# Patient Record
Sex: Female | Born: 1945 | Race: White | Hispanic: No | Marital: Married | Smoking: Former smoker
Health system: Southern US, Community
[De-identification: ages and names within clinical notes are randomized; demographics above are authoritative.]

## PROBLEM LIST (undated history)

## (undated) DIAGNOSIS — I519 Heart disease, unspecified: Secondary | ICD-10-CM

## (undated) DIAGNOSIS — I701 Atherosclerosis of renal artery: Secondary | ICD-10-CM

## (undated) DIAGNOSIS — G629 Polyneuropathy, unspecified: Secondary | ICD-10-CM

## (undated) DIAGNOSIS — Z9889 Other specified postprocedural states: Secondary | ICD-10-CM

## (undated) DIAGNOSIS — H269 Unspecified cataract: Secondary | ICD-10-CM

## (undated) DIAGNOSIS — N289 Disorder of kidney and ureter, unspecified: Secondary | ICD-10-CM

## (undated) DIAGNOSIS — E039 Hypothyroidism, unspecified: Secondary | ICD-10-CM

## (undated) DIAGNOSIS — I509 Heart failure, unspecified: Secondary | ICD-10-CM

## (undated) DIAGNOSIS — F419 Anxiety disorder, unspecified: Secondary | ICD-10-CM

## (undated) DIAGNOSIS — Z9289 Personal history of other medical treatment: Secondary | ICD-10-CM

## (undated) DIAGNOSIS — I272 Pulmonary hypertension, unspecified: Secondary | ICD-10-CM

## (undated) DIAGNOSIS — I330 Acute and subacute infective endocarditis: Secondary | ICD-10-CM

## (undated) DIAGNOSIS — T8859XA Other complications of anesthesia, initial encounter: Secondary | ICD-10-CM

## (undated) DIAGNOSIS — F329 Major depressive disorder, single episode, unspecified: Secondary | ICD-10-CM

## (undated) DIAGNOSIS — I219 Acute myocardial infarction, unspecified: Secondary | ICD-10-CM

## (undated) DIAGNOSIS — M1990 Unspecified osteoarthritis, unspecified site: Secondary | ICD-10-CM

## (undated) DIAGNOSIS — I82890 Acute embolism and thrombosis of other specified veins: Secondary | ICD-10-CM

## (undated) DIAGNOSIS — Z8489 Family history of other specified conditions: Secondary | ICD-10-CM

## (undated) DIAGNOSIS — F32A Depression, unspecified: Secondary | ICD-10-CM

## (undated) DIAGNOSIS — M6281 Muscle weakness (generalized): Secondary | ICD-10-CM

## (undated) DIAGNOSIS — I771 Stricture of artery: Secondary | ICD-10-CM

## (undated) DIAGNOSIS — E7800 Pure hypercholesterolemia, unspecified: Secondary | ICD-10-CM

## (undated) DIAGNOSIS — I2510 Atherosclerotic heart disease of native coronary artery without angina pectoris: Secondary | ICD-10-CM

## (undated) DIAGNOSIS — R7989 Other specified abnormal findings of blood chemistry: Secondary | ICD-10-CM

## (undated) DIAGNOSIS — K219 Gastro-esophageal reflux disease without esophagitis: Secondary | ICD-10-CM

## (undated) DIAGNOSIS — T4145XA Adverse effect of unspecified anesthetic, initial encounter: Secondary | ICD-10-CM

## (undated) DIAGNOSIS — I319 Disease of pericardium, unspecified: Secondary | ICD-10-CM

## (undated) DIAGNOSIS — R0602 Shortness of breath: Secondary | ICD-10-CM

## (undated) DIAGNOSIS — I10 Essential (primary) hypertension: Secondary | ICD-10-CM

## (undated) DIAGNOSIS — N179 Acute kidney failure, unspecified: Secondary | ICD-10-CM

## (undated) DIAGNOSIS — G4730 Sleep apnea, unspecified: Secondary | ICD-10-CM

## (undated) DIAGNOSIS — R112 Nausea with vomiting, unspecified: Secondary | ICD-10-CM

## (undated) DIAGNOSIS — I639 Cerebral infarction, unspecified: Secondary | ICD-10-CM

## (undated) DIAGNOSIS — I200 Unstable angina: Secondary | ICD-10-CM

## (undated) DIAGNOSIS — R42 Dizziness and giddiness: Secondary | ICD-10-CM

## (undated) DIAGNOSIS — B955 Unspecified streptococcus as the cause of diseases classified elsewhere: Secondary | ICD-10-CM

## (undated) HISTORY — DX: Essential (primary) hypertension: I10

## (undated) HISTORY — DX: Pure hypercholesterolemia, unspecified: E78.00

## (undated) HISTORY — PX: BACK SURGERY: SHX140

## (undated) HISTORY — PX: COLON SURGERY: SHX602

## (undated) HISTORY — PX: LAPAROSCOPIC TUBAL LIGATION: SUR803

## (undated) HISTORY — PX: TUBAL LIGATION: SHX77

## (undated) HISTORY — PX: RENAL ARTERY STENT: SHX2321

## (undated) HISTORY — DX: Disorder of kidney and ureter, unspecified: N28.9

## (undated) HISTORY — DX: Personal history of other medical treatment: Z92.89

## (undated) HISTORY — DX: Heart failure, unspecified: I50.9

---

## 2001-01-17 ENCOUNTER — Ambulatory Visit (HOSPITAL_COMMUNITY): Admission: RE | Admit: 2001-01-17 | Discharge: 2001-01-17 | Payer: Self-pay | Admitting: Cardiology

## 2001-04-02 ENCOUNTER — Ambulatory Visit (HOSPITAL_COMMUNITY): Admission: RE | Admit: 2001-04-02 | Discharge: 2001-04-02 | Payer: Self-pay | Admitting: Family Medicine

## 2001-04-02 ENCOUNTER — Encounter: Payer: Self-pay | Admitting: Family Medicine

## 2001-04-02 ENCOUNTER — Other Ambulatory Visit: Admission: RE | Admit: 2001-04-02 | Discharge: 2001-04-02 | Payer: Self-pay | Admitting: Family Medicine

## 2001-08-24 ENCOUNTER — Ambulatory Visit (HOSPITAL_COMMUNITY): Admission: RE | Admit: 2001-08-24 | Discharge: 2001-08-24 | Payer: Self-pay | Admitting: Family Medicine

## 2001-08-24 ENCOUNTER — Encounter: Payer: Self-pay | Admitting: Family Medicine

## 2002-01-11 ENCOUNTER — Inpatient Hospital Stay (HOSPITAL_COMMUNITY): Admission: EM | Admit: 2002-01-11 | Discharge: 2002-01-15 | Payer: Self-pay | Admitting: *Deleted

## 2002-01-11 ENCOUNTER — Encounter: Payer: Self-pay | Admitting: *Deleted

## 2002-05-02 ENCOUNTER — Ambulatory Visit (HOSPITAL_COMMUNITY): Admission: RE | Admit: 2002-05-02 | Discharge: 2002-05-02 | Payer: Self-pay | Admitting: Family Medicine

## 2002-05-02 ENCOUNTER — Encounter: Payer: Self-pay | Admitting: Family Medicine

## 2002-05-23 ENCOUNTER — Inpatient Hospital Stay (HOSPITAL_COMMUNITY): Admission: EM | Admit: 2002-05-23 | Discharge: 2002-05-25 | Payer: Self-pay | Admitting: Emergency Medicine

## 2002-05-23 ENCOUNTER — Encounter: Payer: Self-pay | Admitting: Internal Medicine

## 2002-05-23 ENCOUNTER — Encounter: Payer: Self-pay | Admitting: Emergency Medicine

## 2002-05-25 ENCOUNTER — Encounter: Payer: Self-pay | Admitting: Family Medicine

## 2002-05-30 ENCOUNTER — Encounter: Payer: Self-pay | Admitting: Family Medicine

## 2002-05-30 ENCOUNTER — Ambulatory Visit (HOSPITAL_COMMUNITY): Admission: RE | Admit: 2002-05-30 | Discharge: 2002-05-30 | Payer: Self-pay | Admitting: Family Medicine

## 2002-08-02 ENCOUNTER — Ambulatory Visit (HOSPITAL_COMMUNITY): Admission: RE | Admit: 2002-08-02 | Discharge: 2002-08-02 | Payer: Self-pay | Admitting: Family Medicine

## 2002-08-02 ENCOUNTER — Encounter: Payer: Self-pay | Admitting: Family Medicine

## 2003-04-18 ENCOUNTER — Ambulatory Visit (HOSPITAL_COMMUNITY): Admission: RE | Admit: 2003-04-18 | Discharge: 2003-04-18 | Payer: Self-pay | Admitting: *Deleted

## 2004-01-23 ENCOUNTER — Ambulatory Visit (HOSPITAL_COMMUNITY): Admission: RE | Admit: 2004-01-23 | Discharge: 2004-01-23 | Payer: Self-pay | Admitting: Cardiology

## 2004-02-19 ENCOUNTER — Ambulatory Visit (HOSPITAL_COMMUNITY): Admission: RE | Admit: 2004-02-19 | Discharge: 2004-02-19 | Payer: Self-pay | Admitting: Family Medicine

## 2005-01-07 ENCOUNTER — Ambulatory Visit: Payer: Self-pay | Admitting: Cardiology

## 2005-01-14 ENCOUNTER — Ambulatory Visit (HOSPITAL_COMMUNITY): Admission: RE | Admit: 2005-01-14 | Discharge: 2005-01-14 | Payer: Self-pay | Admitting: Cardiology

## 2005-01-19 ENCOUNTER — Ambulatory Visit (HOSPITAL_COMMUNITY): Admission: RE | Admit: 2005-01-19 | Discharge: 2005-01-19 | Payer: Self-pay | Admitting: Family Medicine

## 2005-01-31 ENCOUNTER — Ambulatory Visit (HOSPITAL_COMMUNITY): Admission: RE | Admit: 2005-01-31 | Discharge: 2005-02-01 | Payer: Self-pay | Admitting: Family Medicine

## 2005-02-04 ENCOUNTER — Ambulatory Visit: Payer: Self-pay | Admitting: Cardiology

## 2005-02-28 ENCOUNTER — Ambulatory Visit: Payer: Self-pay | Admitting: Cardiology

## 2005-03-04 ENCOUNTER — Emergency Department (HOSPITAL_COMMUNITY): Admission: EM | Admit: 2005-03-04 | Discharge: 2005-03-05 | Payer: Self-pay | Admitting: Emergency Medicine

## 2005-03-31 ENCOUNTER — Ambulatory Visit: Payer: Self-pay | Admitting: Cardiology

## 2005-06-07 ENCOUNTER — Ambulatory Visit (HOSPITAL_COMMUNITY): Admission: RE | Admit: 2005-06-07 | Discharge: 2005-06-07 | Payer: Self-pay | Admitting: Family Medicine

## 2005-06-16 ENCOUNTER — Ambulatory Visit: Payer: Self-pay | Admitting: Cardiology

## 2005-06-24 ENCOUNTER — Ambulatory Visit: Payer: Self-pay | Admitting: Cardiology

## 2005-06-29 ENCOUNTER — Inpatient Hospital Stay (HOSPITAL_COMMUNITY): Admission: EM | Admit: 2005-06-29 | Discharge: 2005-07-02 | Payer: Self-pay | Admitting: Cardiology

## 2005-06-29 ENCOUNTER — Ambulatory Visit: Payer: Self-pay | Admitting: Cardiology

## 2005-06-30 ENCOUNTER — Encounter: Payer: Self-pay | Admitting: Cardiology

## 2005-07-04 ENCOUNTER — Ambulatory Visit: Payer: Self-pay | Admitting: Cardiology

## 2005-07-06 ENCOUNTER — Ambulatory Visit: Payer: Self-pay

## 2005-07-07 ENCOUNTER — Ambulatory Visit: Payer: Self-pay | Admitting: Cardiology

## 2005-07-15 ENCOUNTER — Ambulatory Visit: Payer: Self-pay | Admitting: Cardiology

## 2005-07-26 ENCOUNTER — Ambulatory Visit: Payer: Self-pay | Admitting: Cardiology

## 2005-08-03 ENCOUNTER — Ambulatory Visit: Payer: Self-pay | Admitting: Cardiology

## 2005-08-03 ENCOUNTER — Ambulatory Visit (HOSPITAL_COMMUNITY): Admission: RE | Admit: 2005-08-03 | Discharge: 2005-08-04 | Payer: Self-pay | Admitting: Cardiology

## 2005-08-18 ENCOUNTER — Ambulatory Visit: Payer: Self-pay | Admitting: Cardiology

## 2005-08-25 ENCOUNTER — Ambulatory Visit: Payer: Self-pay

## 2005-08-25 ENCOUNTER — Ambulatory Visit: Payer: Self-pay | Admitting: Cardiology

## 2005-09-02 ENCOUNTER — Ambulatory Visit: Payer: Self-pay | Admitting: Cardiology

## 2005-09-19 ENCOUNTER — Ambulatory Visit: Payer: Self-pay | Admitting: Cardiology

## 2005-09-26 ENCOUNTER — Ambulatory Visit: Payer: Self-pay | Admitting: Cardiology

## 2005-09-26 ENCOUNTER — Ambulatory Visit (HOSPITAL_COMMUNITY): Admission: RE | Admit: 2005-09-26 | Discharge: 2005-09-26 | Payer: Self-pay | Admitting: Family Medicine

## 2005-11-02 ENCOUNTER — Ambulatory Visit: Payer: Self-pay | Admitting: Cardiology

## 2005-11-10 ENCOUNTER — Ambulatory Visit: Payer: Self-pay | Admitting: Cardiology

## 2005-11-15 ENCOUNTER — Ambulatory Visit: Payer: Self-pay | Admitting: Cardiology

## 2005-11-25 ENCOUNTER — Inpatient Hospital Stay (HOSPITAL_BASED_OUTPATIENT_CLINIC_OR_DEPARTMENT_OTHER): Admission: RE | Admit: 2005-11-25 | Discharge: 2005-11-25 | Payer: Self-pay | Admitting: Cardiology

## 2005-11-25 ENCOUNTER — Ambulatory Visit: Payer: Self-pay | Admitting: Cardiology

## 2005-11-25 HISTORY — PX: CARDIAC CATHETERIZATION: SHX172

## 2005-12-02 ENCOUNTER — Ambulatory Visit: Payer: Self-pay | Admitting: Cardiology

## 2005-12-07 ENCOUNTER — Ambulatory Visit (HOSPITAL_COMMUNITY): Admission: RE | Admit: 2005-12-07 | Discharge: 2005-12-07 | Payer: Self-pay | Admitting: Cardiology

## 2005-12-07 ENCOUNTER — Ambulatory Visit: Payer: Self-pay | Admitting: Cardiology

## 2005-12-09 ENCOUNTER — Ambulatory Visit: Payer: Self-pay | Admitting: Cardiology

## 2005-12-23 ENCOUNTER — Ambulatory Visit: Payer: Self-pay

## 2006-01-12 ENCOUNTER — Ambulatory Visit: Payer: Self-pay | Admitting: Cardiology

## 2006-03-29 ENCOUNTER — Ambulatory Visit: Payer: Self-pay | Admitting: Cardiology

## 2006-05-02 ENCOUNTER — Ambulatory Visit: Payer: Self-pay | Admitting: Cardiology

## 2006-05-26 ENCOUNTER — Inpatient Hospital Stay (HOSPITAL_COMMUNITY): Admission: EM | Admit: 2006-05-26 | Discharge: 2006-05-27 | Payer: Self-pay | Admitting: Emergency Medicine

## 2006-05-26 ENCOUNTER — Ambulatory Visit: Payer: Self-pay | Admitting: *Deleted

## 2006-05-29 ENCOUNTER — Ambulatory Visit: Payer: Self-pay | Admitting: Cardiology

## 2006-05-29 ENCOUNTER — Ambulatory Visit: Payer: Self-pay

## 2006-06-06 ENCOUNTER — Ambulatory Visit: Payer: Self-pay | Admitting: Cardiology

## 2006-06-14 ENCOUNTER — Ambulatory Visit: Payer: Self-pay | Admitting: Cardiology

## 2006-06-21 ENCOUNTER — Ambulatory Visit: Payer: Self-pay | Admitting: Cardiology

## 2006-07-10 ENCOUNTER — Ambulatory Visit (HOSPITAL_COMMUNITY): Admission: RE | Admit: 2006-07-10 | Discharge: 2006-07-10 | Payer: Self-pay | Admitting: Family Medicine

## 2007-02-28 ENCOUNTER — Ambulatory Visit (HOSPITAL_COMMUNITY): Admission: RE | Admit: 2007-02-28 | Discharge: 2007-02-28 | Payer: Self-pay | Admitting: Family Medicine

## 2007-04-25 ENCOUNTER — Ambulatory Visit (HOSPITAL_COMMUNITY): Admission: RE | Admit: 2007-04-25 | Discharge: 2007-04-25 | Payer: Self-pay | Admitting: Family Medicine

## 2007-04-27 ENCOUNTER — Ambulatory Visit: Payer: Self-pay | Admitting: Cardiology

## 2007-04-27 ENCOUNTER — Inpatient Hospital Stay (HOSPITAL_COMMUNITY): Admission: AD | Admit: 2007-04-27 | Discharge: 2007-04-30 | Payer: Self-pay | Admitting: Family Medicine

## 2007-04-27 ENCOUNTER — Encounter (INDEPENDENT_AMBULATORY_CARE_PROVIDER_SITE_OTHER): Payer: Self-pay | Admitting: Family Medicine

## 2007-06-06 ENCOUNTER — Ambulatory Visit (HOSPITAL_COMMUNITY): Admission: RE | Admit: 2007-06-06 | Discharge: 2007-06-06 | Payer: Self-pay | Admitting: Family Medicine

## 2007-06-11 ENCOUNTER — Ambulatory Visit (HOSPITAL_COMMUNITY): Admission: RE | Admit: 2007-06-11 | Discharge: 2007-06-11 | Payer: Self-pay | Admitting: Family Medicine

## 2007-08-10 ENCOUNTER — Ambulatory Visit (HOSPITAL_COMMUNITY): Admission: RE | Admit: 2007-08-10 | Discharge: 2007-08-10 | Payer: Self-pay | Admitting: Family Medicine

## 2007-08-16 ENCOUNTER — Ambulatory Visit (HOSPITAL_COMMUNITY): Admission: RE | Admit: 2007-08-16 | Discharge: 2007-08-16 | Payer: Self-pay | Admitting: Family Medicine

## 2007-08-16 ENCOUNTER — Encounter (INDEPENDENT_AMBULATORY_CARE_PROVIDER_SITE_OTHER): Payer: Self-pay | Admitting: Family Medicine

## 2007-09-04 ENCOUNTER — Observation Stay (HOSPITAL_COMMUNITY): Admission: RE | Admit: 2007-09-04 | Discharge: 2007-09-05 | Payer: Self-pay | Admitting: Occupational Medicine

## 2007-09-04 HISTORY — PX: CARDIAC CATHETERIZATION: SHX172

## 2007-09-04 HISTORY — PX: RENAL ANGIOGRAM: SHX6061

## 2008-01-02 HISTORY — PX: TRANSTHORACIC ECHOCARDIOGRAM: SHX275

## 2008-01-31 ENCOUNTER — Ambulatory Visit (HOSPITAL_COMMUNITY): Admission: RE | Admit: 2008-01-31 | Discharge: 2008-01-31 | Payer: Self-pay | Admitting: Family Medicine

## 2008-04-15 HISTORY — PX: OTHER SURGICAL HISTORY: SHX169

## 2008-05-30 ENCOUNTER — Emergency Department (HOSPITAL_COMMUNITY): Admission: EM | Admit: 2008-05-30 | Discharge: 2008-05-30 | Payer: Self-pay | Admitting: Emergency Medicine

## 2008-06-19 DIAGNOSIS — E039 Hypothyroidism, unspecified: Secondary | ICD-10-CM | POA: Insufficient documentation

## 2008-06-19 DIAGNOSIS — E785 Hyperlipidemia, unspecified: Secondary | ICD-10-CM

## 2008-06-19 DIAGNOSIS — I10 Essential (primary) hypertension: Secondary | ICD-10-CM | POA: Insufficient documentation

## 2008-06-19 DIAGNOSIS — G608 Other hereditary and idiopathic neuropathies: Secondary | ICD-10-CM

## 2008-06-19 DIAGNOSIS — R42 Dizziness and giddiness: Secondary | ICD-10-CM | POA: Insufficient documentation

## 2008-06-19 DIAGNOSIS — I119 Hypertensive heart disease without heart failure: Secondary | ICD-10-CM | POA: Insufficient documentation

## 2008-06-19 HISTORY — DX: Hypothyroidism, unspecified: E03.9

## 2008-06-20 ENCOUNTER — Ambulatory Visit: Payer: Self-pay | Admitting: Internal Medicine

## 2008-06-20 DIAGNOSIS — G4730 Sleep apnea, unspecified: Secondary | ICD-10-CM | POA: Insufficient documentation

## 2008-06-20 DIAGNOSIS — R0989 Other specified symptoms and signs involving the circulatory and respiratory systems: Secondary | ICD-10-CM

## 2008-06-20 DIAGNOSIS — R0609 Other forms of dyspnea: Secondary | ICD-10-CM | POA: Insufficient documentation

## 2008-06-20 HISTORY — DX: Sleep apnea, unspecified: G47.30

## 2008-07-21 ENCOUNTER — Telehealth (INDEPENDENT_AMBULATORY_CARE_PROVIDER_SITE_OTHER): Payer: Self-pay | Admitting: *Deleted

## 2008-07-22 ENCOUNTER — Ambulatory Visit: Payer: Self-pay | Admitting: Internal Medicine

## 2008-07-22 DIAGNOSIS — J449 Chronic obstructive pulmonary disease, unspecified: Secondary | ICD-10-CM

## 2008-07-22 DIAGNOSIS — J4489 Other specified chronic obstructive pulmonary disease: Secondary | ICD-10-CM | POA: Insufficient documentation

## 2008-09-24 ENCOUNTER — Ambulatory Visit (HOSPITAL_COMMUNITY): Admission: RE | Admit: 2008-09-24 | Discharge: 2008-09-24 | Payer: Self-pay | Admitting: Family Medicine

## 2009-04-07 ENCOUNTER — Inpatient Hospital Stay (HOSPITAL_COMMUNITY): Admission: EM | Admit: 2009-04-07 | Discharge: 2009-04-14 | Payer: Self-pay | Admitting: Emergency Medicine

## 2009-04-09 ENCOUNTER — Encounter (INDEPENDENT_AMBULATORY_CARE_PROVIDER_SITE_OTHER): Payer: Self-pay | Admitting: Cardiology

## 2009-05-06 ENCOUNTER — Ambulatory Visit (HOSPITAL_COMMUNITY): Admission: RE | Admit: 2009-05-06 | Discharge: 2009-05-07 | Payer: Self-pay | Admitting: Cardiovascular Disease

## 2009-05-06 HISTORY — PX: RENAL ANGIOGRAM: SHX6061

## 2010-01-18 HISTORY — PX: OTHER SURGICAL HISTORY: SHX169

## 2010-06-11 ENCOUNTER — Ambulatory Visit (HOSPITAL_COMMUNITY)
Admission: RE | Admit: 2010-06-11 | Discharge: 2010-06-11 | Payer: Self-pay | Source: Home / Self Care | Attending: Family Medicine | Admitting: Family Medicine

## 2010-06-13 ENCOUNTER — Encounter: Payer: Self-pay | Admitting: Cardiology

## 2010-06-13 ENCOUNTER — Encounter: Payer: Self-pay | Admitting: Family Medicine

## 2010-08-23 LAB — BASIC METABOLIC PANEL
CO2: 29 mEq/L (ref 19–32)
Calcium: 9.1 mg/dL (ref 8.4–10.5)
Creatinine, Ser: 1.24 mg/dL — ABNORMAL HIGH (ref 0.4–1.2)
GFR calc Af Amer: 53 mL/min — ABNORMAL LOW (ref >60)

## 2010-08-23 LAB — CBC
MCHC: 33.8 g/dL (ref 30.0–36.0)
RBC: 4.08 MIL/uL (ref 3.87–5.11)
WBC: 7.2 10*3/uL (ref 4.0–10.5)

## 2010-08-23 LAB — DIFFERENTIAL
Basophils Relative: 1 % (ref 0–1)
Monocytes Relative: 10 % (ref 3–12)
Neutro Abs: 3.7 10*3/uL (ref 1.7–7.7)
Neutrophils Relative %: 51 % (ref 43–77)

## 2010-08-23 LAB — GLUCOSE, CAPILLARY: Glucose-Capillary: 112 mg/dL — ABNORMAL HIGH (ref 70–99)

## 2010-08-24 LAB — GLUCOSE, CAPILLARY
Glucose-Capillary: 74 mg/dL (ref 70–99)
Glucose-Capillary: 98 mg/dL (ref 70–99)

## 2010-08-24 LAB — CBC
MCHC: 34.5 g/dL (ref 30.0–36.0)
Platelets: 212 10*3/uL (ref 150–400)
RBC: 4.24 MIL/uL (ref 3.87–5.11)
RDW: 14.2 % (ref 11.5–15.5)

## 2010-08-25 LAB — CBC
HCT: 34.4 % — ABNORMAL LOW (ref 36.0–46.0)
HCT: 38.8 % (ref 36.0–46.0)
HCT: 42 % (ref 36.0–46.0)
Hemoglobin: 12.7 g/dL (ref 12.0–15.0)
Hemoglobin: 13.2 g/dL (ref 12.0–15.0)
MCHC: 34 g/dL (ref 30.0–36.0)
MCHC: 34.5 g/dL (ref 30.0–36.0)
MCHC: 34.6 g/dL (ref 30.0–36.0)
MCV: 89.6 fL (ref 78.0–100.0)
MCV: 91 fL (ref 78.0–100.0)
MCV: 91.3 fL (ref 78.0–100.0)
MCV: 92 fL (ref 78.0–100.0)
MCV: 92.2 fL (ref 78.0–100.0)
Platelets: 190 10*3/uL (ref 150–400)
Platelets: 191 10*3/uL (ref 150–400)
Platelets: 194 10*3/uL (ref 150–400)
Platelets: 200 10*3/uL (ref 150–400)
RBC: 3.96 MIL/uL (ref 3.87–5.11)
RBC: 4.21 MIL/uL (ref 3.87–5.11)
RBC: 4.51 MIL/uL (ref 3.87–5.11)
RBC: 4.61 MIL/uL (ref 3.87–5.11)
RDW: 14.3 % (ref 11.5–15.5)
RDW: 14.6 % (ref 11.5–15.5)
RDW: 14.7 % (ref 11.5–15.5)
WBC: 11.6 10*3/uL — ABNORMAL HIGH (ref 4.0–10.5)
WBC: 7.2 10*3/uL (ref 4.0–10.5)
WBC: 7.4 10*3/uL (ref 4.0–10.5)
WBC: 7.6 10*3/uL (ref 4.0–10.5)

## 2010-08-25 LAB — BASIC METABOLIC PANEL
BUN: 25 mg/dL — ABNORMAL HIGH (ref 6–23)
BUN: 31 mg/dL — ABNORMAL HIGH (ref 6–23)
BUN: 33 mg/dL — ABNORMAL HIGH (ref 6–23)
BUN: 37 mg/dL — ABNORMAL HIGH (ref 6–23)
BUN: 43 mg/dL — ABNORMAL HIGH (ref 6–23)
BUN: 44 mg/dL — ABNORMAL HIGH (ref 6–23)
CO2: 24 mEq/L (ref 19–32)
CO2: 26 mEq/L (ref 19–32)
CO2: 27 mEq/L (ref 19–32)
CO2: 27 mEq/L (ref 19–32)
Calcium: 8.7 mg/dL (ref 8.4–10.5)
Chloride: 102 mEq/L (ref 96–112)
Chloride: 103 mEq/L (ref 96–112)
Chloride: 103 mEq/L (ref 96–112)
Chloride: 104 mEq/L (ref 96–112)
Creatinine, Ser: 1.33 mg/dL — ABNORMAL HIGH (ref 0.4–1.2)
Creatinine, Ser: 1.4 mg/dL — ABNORMAL HIGH (ref 0.4–1.2)
Creatinine, Ser: 1.52 mg/dL — ABNORMAL HIGH (ref 0.4–1.2)
Creatinine, Ser: 1.62 mg/dL — ABNORMAL HIGH (ref 0.4–1.2)
Creatinine, Ser: 1.85 mg/dL — ABNORMAL HIGH (ref 0.4–1.2)
GFR calc Af Amer: 41 mL/min — ABNORMAL LOW (ref >60)
GFR calc Af Amer: 42 mL/min — ABNORMAL LOW (ref >60)
GFR calc Af Amer: 46 mL/min — ABNORMAL LOW (ref >60)
GFR calc non Af Amer: 28 mL/min — ABNORMAL LOW (ref >60)
GFR calc non Af Amer: 32 mL/min — ABNORMAL LOW (ref >60)
GFR calc non Af Amer: 34 mL/min — ABNORMAL LOW (ref >60)
GFR calc non Af Amer: 35 mL/min — ABNORMAL LOW (ref >60)
GFR calc non Af Amer: 36 mL/min — ABNORMAL LOW (ref >60)
GFR calc non Af Amer: 38 mL/min — ABNORMAL LOW (ref >60)
Glucose, Bld: 139 mg/dL — ABNORMAL HIGH (ref 70–99)
Glucose, Bld: 166 mg/dL — ABNORMAL HIGH (ref 70–99)
Glucose, Bld: 249 mg/dL — ABNORMAL HIGH (ref 70–99)
Glucose, Bld: 92 mg/dL (ref 70–99)
Potassium: 4.7 mEq/L (ref 3.5–5.1)
Potassium: 5 mEq/L (ref 3.5–5.1)
Potassium: 5.1 mEq/L (ref 3.5–5.1)
Potassium: 5.7 mEq/L — ABNORMAL HIGH (ref 3.5–5.1)
Sodium: 136 mEq/L (ref 135–145)
Sodium: 137 mEq/L (ref 135–145)

## 2010-08-25 LAB — COMPREHENSIVE METABOLIC PANEL
AST: 36 U/L (ref 0–37)
Albumin: 3.8 g/dL (ref 3.5–5.2)
BUN: 23 mg/dL (ref 6–23)
CO2: 28 mEq/L (ref 19–32)
Calcium: 9.9 mg/dL (ref 8.4–10.5)
Creatinine, Ser: 1.22 mg/dL — ABNORMAL HIGH (ref 0.4–1.2)
GFR calc Af Amer: 54 mL/min — ABNORMAL LOW (ref >60)
GFR calc non Af Amer: 45 mL/min — ABNORMAL LOW (ref >60)
Total Bilirubin: 1 mg/dL (ref 0.3–1.2)

## 2010-08-25 LAB — CK TOTAL AND CKMB (NOT AT ARMC)
CK, MB: 18.9 ng/mL — ABNORMAL HIGH (ref 0.3–4.0)
CK, MB: 24.9 ng/mL — ABNORMAL HIGH (ref 0.3–4.0)
Relative Index: 7.8 — ABNORMAL HIGH (ref 0.0–2.5)
Relative Index: 8.3 — ABNORMAL HIGH (ref 0.0–2.5)
Relative Index: 9 — ABNORMAL HIGH (ref 0.0–2.5)
Relative Index: 9.7 — ABNORMAL HIGH (ref 0.0–2.5)
Total CK: 209 U/L — ABNORMAL HIGH (ref 7–177)
Total CK: 243 U/L — ABNORMAL HIGH (ref 7–177)
Total CK: 257 U/L — ABNORMAL HIGH (ref 7–177)
Total CK: 484 U/L — ABNORMAL HIGH (ref 7–177)

## 2010-08-25 LAB — GLUCOSE, CAPILLARY
Glucose-Capillary: 116 mg/dL — ABNORMAL HIGH (ref 70–99)
Glucose-Capillary: 127 mg/dL — ABNORMAL HIGH (ref 70–99)
Glucose-Capillary: 130 mg/dL — ABNORMAL HIGH (ref 70–99)
Glucose-Capillary: 144 mg/dL — ABNORMAL HIGH (ref 70–99)
Glucose-Capillary: 168 mg/dL — ABNORMAL HIGH (ref 70–99)
Glucose-Capillary: 184 mg/dL — ABNORMAL HIGH (ref 70–99)
Glucose-Capillary: 195 mg/dL — ABNORMAL HIGH (ref 70–99)
Glucose-Capillary: 231 mg/dL — ABNORMAL HIGH (ref 70–99)
Glucose-Capillary: 249 mg/dL — ABNORMAL HIGH (ref 70–99)
Glucose-Capillary: 351 mg/dL — ABNORMAL HIGH (ref 70–99)
Glucose-Capillary: 377 mg/dL — ABNORMAL HIGH (ref 70–99)
Glucose-Capillary: 406 mg/dL — ABNORMAL HIGH (ref 70–99)
Glucose-Capillary: 428 mg/dL — ABNORMAL HIGH (ref 70–99)
Glucose-Capillary: 45 mg/dL — ABNORMAL LOW (ref 70–99)
Glucose-Capillary: 64 mg/dL — ABNORMAL LOW (ref 70–99)
Glucose-Capillary: 83 mg/dL (ref 70–99)

## 2010-08-25 LAB — POCT I-STAT, CHEM 8
BUN: 23 mg/dL (ref 6–23)
BUN: 26 mg/dL — ABNORMAL HIGH (ref 6–23)
Calcium, Ion: 1.2 mmol/L (ref 1.12–1.32)
Chloride: 105 mEq/L (ref 96–112)
Creatinine, Ser: 1.3 mg/dL — ABNORMAL HIGH (ref 0.4–1.2)
HCT: 42 % (ref 36.0–46.0)
Hemoglobin: 14.3 g/dL (ref 12.0–15.0)
Potassium: 3.6 mEq/L (ref 3.5–5.1)
Potassium: 4.1 mEq/L (ref 3.5–5.1)
Sodium: 140 mEq/L (ref 135–145)

## 2010-08-25 LAB — DIFFERENTIAL
Basophils Absolute: 0.1 10*3/uL (ref 0.0–0.1)
Eosinophils Relative: 2 % (ref 0–5)
Lymphocytes Relative: 23 % (ref 12–46)
Lymphs Abs: 1.7 10*3/uL (ref 0.7–4.0)
Neutro Abs: 4.7 10*3/uL (ref 1.7–7.7)

## 2010-08-25 LAB — CARDIAC PANEL(CRET KIN+CKTOT+MB+TROPI)
CK, MB: 38 ng/mL — ABNORMAL HIGH (ref 0.3–4.0)
CK, MB: 69.8 ng/mL — ABNORMAL HIGH (ref 0.3–4.0)
Relative Index: 10.9 — ABNORMAL HIGH (ref 0.0–2.5)
Relative Index: 6.1 — ABNORMAL HIGH (ref 0.0–2.5)

## 2010-08-25 LAB — POCT CARDIAC MARKERS
CKMB, poc: 52.6 ng/mL (ref 1.0–8.0)
Myoglobin, poc: >500 ng/mL (ref 12–200)
Troponin i, poc: <0.05 ng/mL (ref 0.00–0.09)

## 2010-08-25 LAB — URINE MICROSCOPIC-ADD ON

## 2010-08-25 LAB — URINALYSIS, ROUTINE W REFLEX MICROSCOPIC
Bilirubin Urine: NEGATIVE
Glucose, UA: NEGATIVE mg/dL
Ketones, ur: NEGATIVE mg/dL
Protein, ur: 100 mg/dL — AB

## 2010-08-25 LAB — URINE CULTURE: Colony Count: 15000

## 2010-08-25 LAB — TROPONIN I: Troponin I: 0.09 ng/mL — ABNORMAL HIGH (ref 0.00–0.06)

## 2010-08-25 LAB — BRAIN NATRIURETIC PEPTIDE
Pro B Natriuretic peptide (BNP): 1319 pg/mL — ABNORMAL HIGH (ref 0.0–100.0)
Pro B Natriuretic peptide (BNP): 1869 pg/mL — ABNORMAL HIGH (ref 0.0–100.0)
Pro B Natriuretic peptide (BNP): 628 pg/mL — ABNORMAL HIGH (ref 0.0–100.0)
Pro B Natriuretic peptide (BNP): 871 pg/mL — ABNORMAL HIGH (ref 0.0–100.0)
Pro B Natriuretic peptide (BNP): 915 pg/mL — ABNORMAL HIGH (ref 0.0–100.0)

## 2010-08-25 LAB — HEMOGLOBIN A1C: Hgb A1c MFr Bld: 7.6 % — ABNORMAL HIGH (ref 4.6–6.1)

## 2010-08-25 LAB — CK: Total CK: 1068 U/L — ABNORMAL HIGH (ref 7–177)

## 2010-09-06 LAB — COMPREHENSIVE METABOLIC PANEL
ALT: 29 U/L (ref 0–35)
AST: 29 U/L (ref 0–37)
CO2: 26 mEq/L (ref 19–32)
Chloride: 102 mEq/L (ref 96–112)
GFR calc Af Amer: 37 mL/min — ABNORMAL LOW (ref >60)
GFR calc non Af Amer: 31 mL/min — ABNORMAL LOW (ref >60)
Potassium: 3.6 mEq/L (ref 3.5–5.1)
Sodium: 136 mEq/L (ref 135–145)
Total Bilirubin: 1 mg/dL (ref 0.3–1.2)

## 2010-09-06 LAB — URINE CULTURE

## 2010-09-06 LAB — CBC
RBC: 4.99 MIL/uL (ref 3.87–5.11)
WBC: 7.3 10*3/uL (ref 4.0–10.5)

## 2010-09-06 LAB — URINALYSIS, ROUTINE W REFLEX MICROSCOPIC
Glucose, UA: 500 mg/dL — AB
Leukocytes, UA: NEGATIVE
Protein, ur: 100 mg/dL — AB
Urobilinogen, UA: 0.2 mg/dL (ref 0.0–1.0)

## 2010-09-06 LAB — POCT CARDIAC MARKERS
CKMB, poc: 4.6 ng/mL (ref 1.0–8.0)
CKMB, poc: 5.3 ng/mL (ref 1.0–8.0)
Troponin i, poc: <0.05 ng/mL (ref 0.00–0.09)
Troponin i, poc: <0.05 ng/mL (ref 0.00–0.09)

## 2010-09-06 LAB — DIFFERENTIAL
Basophils Absolute: 0.1 10*3/uL (ref 0.0–0.1)
Eosinophils Absolute: 0.1 10*3/uL (ref 0.0–0.7)
Eosinophils Relative: 2 % (ref 0–5)

## 2010-09-06 LAB — URINE MICROSCOPIC-ADD ON

## 2010-09-06 LAB — D-DIMER, QUANTITATIVE: D-Dimer, Quant: 0.7 ug/mL-FEU — ABNORMAL HIGH (ref 0.00–0.48)

## 2010-10-05 NOTE — Group Therapy Note (Signed)
NAMEENEDINA, PAIR              ACCOUNT NO.:  [id number]   MEDICAL RECORD NO.:  [id number]          PATIENT TYPE:  INP   LOCATION:  A222                          FACILITY:  [facility]   PHYSICIAN:  Angus G. Renard Matter, MD   DATE OF BIRTH:  04-May-1946   DATE OF PROCEDURE:  04/29/2007  DATE OF DISCHARGE:                                 PROGRESS NOTE   This patient was admitted with dyspnea with exertion.  She does have a  long-standing history of coronary artery disease, has been in congestive  heart failure previously.  She does have type 2 diabetes,  hypothyroidism, bilateral renal artery stenosis, renal insufficiency.  She had an abnormal lab initially which showed a BNP of 886.  X-ray  showed no acute findings.  She also has a history of pulmonary  hypertension.   OBJECTIVE:  VITAL SIGNS:  Blood pressure 127/48, respirations 20, pulse  53, temperature 98.2, _blood sugars_________  of 122 to 276.  Blood  gases:  pH of 7.444, with a PO2 of 77.4, and a PCO2 101.4.  Chemistries:  Sodium 139, potassium 3.7, chloride 101, CO2 27, BUN 63, creatinine 2.5.  __________ .  LUNGS:  Clear to P&A.  ABDOMEN:  No palpable organs or masses.   ASSESSMENT:  The patient was admitted with elevated BNP, dyspnea,  congestive heart failure, coronary artery disease, history of chronic  kidney disease, diabetes, dyslipidemia, and hypokalemia.  Sodium 138,  potassium 3.7, chloride 207, BUN 63, creatinine 2.5.   PLAN:  To continue regimen.  Will discharge the patient.  The patient  does need to see nephrology as an outpatient and follow up with  cardiology.      Angus G. Renard Matter, MD  Electronically Signed     AGM/MEDQ  D:  04/29/2007  T:  04/29/2007  Job:  [id number]

## 2010-10-05 NOTE — Procedures (Signed)
NAMENEDDIE, STEEDMAN NO.:  [id number]   MEDICAL RECORD NO.:  [id number]          PATIENT TYPE:  INP   LOCATION:  A222                          FACILITY:  [facility]   PHYSICIAN:  Gerrit Friends. Dietrich Pates, MD, FACCDATE OF BIRTH:  06/27/1945   DATE OF PROCEDURE:  04/27/2007  DATE OF DISCHARGE:                                ECHOCARDIOGRAM   REFERRING PHYSICIAN:  Angus G. McInnis, MD.   CLINICAL DATA:  A 65 year old woman with congestive heart failure,  widespread vascular disease and renal insufficiency.   M-mode aorta 2.6, left atrium 4.2, septum 1.5, posterior wall 1.6, LV  diastole 4.0, LV systole 2.7.  1. Technically adequate echocardiographic study.  2. Left atrial size at the upper limit of normal; right atrial size      upper normal as well.  3. Normal right ventricular size and function; mild RVH.  4. Minimal aortic valvular sclerosis.  Very mild insufficiency.  5. Normal diameter of the proximal ascending aorta; minimal      calcification of the wall and annulus.  6. Normal mitral valve; mild annular calcification.  7. Normal tricuspid valve; mild regurgitation; mild elevation in      estimated RV systolic pressure.  8. Normal pulmonic valve and proximal pulmonary artery.  9. Normal IVC.  10.Normal left ventricular size; moderate hypertrophy; normal to      hyperdynamic regional and global function.      Gerrit Friends. Dietrich Pates, MD, [facility]  Electronically Signed     RMR/MEDQ  D:  04/28/2007  T:  04/29/2007  Job:  [id number]

## 2010-10-05 NOTE — Group Therapy Note (Signed)
[name], [name]              ACCOUNT NO.:  [id number]   MEDICAL RECORD NO.:  [id number]          PATIENT TYPE:  INP   LOCATION:  A222                          FACILITY:  [facility]   PHYSICIAN:  Angus G. Renard Matter, MD   DATE OF BIRTH:  [birth year]   DATE OF PROCEDURE:  04/28/2007  DATE OF DISCHARGE:                                 PROGRESS NOTE   SUBJECTIVE:  This patient was admitted with dyspnea with exertion.  She  does have a longstanding history of coronary artery disease.  Had been  in congestive heart failure previously.  Does have bilateral renal  artery stenosis, type 2 diabetes, hypothyroidism.  She had had abnormal  lab data prior to her admission.  BNP 886.  Recent chest x-ray showed no  acute findings.  Does have a history of malignant hypertension, coronary  artery disease, congestive heart failure, insulin dependent diabetes.   OBJECTIVE:  VITAL SIGNS:  Blood pressure 119/63, respiration 20, pulse  49, temperature 97.6.  Blood sugars high on one occasion, more recent  122.  Blood gas showed a pH of 7.444 with a pCO2 of 36.9, pO2 77.4.  Chemistries:  Sodium 135, potassium 3.2, chloride 99, CO2 26, BUN 50,  creatinine 2.43, GFR 20.  LUNGS:  Diminished breath sounds bilaterally.  HEART:  Regular rhythm, bradycardia.  ABDOMEN:  No palpable organs or  masses.   ASSESSMENT:  Patient admitted with elevated BNP, dyspnea, CHF, coronary  artery disease, stage III chronic kidney disease, insulin-dependent  diabetes, dyslipidemia, hypokalemia.   PLAN:  Replete serum potassium.  Continue more aggressive diuresis.      Angus G. Renard Matter, MD  Electronically Signed     AGM/MEDQ  D:  04/28/2007  T:  04/29/2007  Job:  [id number]

## 2010-10-05 NOTE — Cardiovascular Report (Signed)
[name], DIAMOND NO.:  [id number]   MEDICAL RECORD NO.:  [id number]          PATIENT TYPE:  INP   LOCATION:  6531                         FACILITY:  [facility]   PHYSICIAN:  Nanetta Batty, M.D.   DATE OF BIRTH:  1945-07-01   DATE OF PROCEDURE:  DATE OF DISCHARGE:                            CARDIAC CATHETERIZATION   [name] is a 65 year old mildly overweight married white female  mother of 3 referred by Dr. Renard Matter for evaluation of resistant  hypertension.  She has a 40-pack-year history of tobacco abuse, having  quit December 2008.  She is an insulin-dependent diabetic and has  hypertension.  She is hyperlipidemic and has a family history of heart  disease.  She has had bilateral renal artery stenting as well as  subclavian stenting with reintervention by Dr. Samule Ohm.  She has  documented normal coronary arteries.  She has been having accelerated  hypertension as well as chest pain.  Renal Dopplers suggested high-grade  bilateral renal artery stenosis.  She presents now for a cardiac  catheterization followed by abdominal aortography with selective renal  angiography and potential renal intervention for a treatment of  renovascular disease.   PROCEDURE DESCRIPTION:  The patient was brought to the second floor of   [facility] in the postabsorptive state.  She was  premedicated with p.o. Valium, IV Versed, and fentanyl.  Her right groin  was prepped and shaved in the usual sterile fashion.  1% bupivacaine was  used for local anesthesia because of Novocaine allergy.  A 6-French  right Judkins catheter as well as 6-French JL-3 guide were used for  selective cholangiography.  The left ventriculography was not performed  because of contrast issues and the patient's compromised renal function.  Visipaque dye was used for the entirety of the case.  Retrograde aortic  pressure was monitored during the case.  Blood pressure was in the  270/100  range.   ANGIOGRAPHIC RESULTS:  1. Left main normal.  2. LAD; LAD had a 50% segmental proximal stenosis.  3. Left circumflex; free of stenotic disease.  4. Right coronary; free of stenotic disease.   IMPRESSION:  Ms. Gandolfi has nonequivocal coronary artery disease with  a 50% segmental proximal left anterior descending disease.      Nanetta Batty, M.D.  Electronically Signed     JB/MEDQ  D:  09/04/2007  T:  09/05/2007  Job:  [id number]   cc:   [facility]  Dr. Annetta Maw  Dr. Baruch Merl  [facility] Cardiac Cath Lab

## 2010-10-05 NOTE — H&P (Signed)
[name], [name]              ACCOUNT NO.:  [id number]   MEDICAL RECORD NO.:  [id number]          PATIENT TYPE:  INP   LOCATION:  A222                          FACILITY:  [facility]   PHYSICIAN:  Angus G. Renard Matter, MD   DATE OF BIRTH:  06-13-1945   DATE OF ADMISSION:  04/27/2007  DATE OF DISCHARGE:  LH                              HISTORY & PHYSICAL   HISTORY OF PRESENT ILLNESS:  This 65 year old, married, Caucasian female  was seen in the office on the day of admission with continued dyspnea  with exertion.  This patient has a longstanding history of hypertension  and coronary artery disease.  She had been in congestive heart failure  previously.  She had a history of bilateral renal artery stenosis, type  2 diabetes and hypothyroidism.  She was noted to have abnormal  laboratory data with a sodium of 140, potassium 4.2, chloride 106, CO2  25, BUN 36, creatinine 1.89 and a BNP of 886.0.  A recent chest x-ray  showed no acute findings.  Heart was enlarged, but stable.  The patient  had been given, prior to this admission, Lasix and this had failed to  cause much of a diuresis.  She continues to have dyspnea and it was felt  that she should be admitted for more aggressive diuresis and further  investigation into other causes into her dyspnea.   FAMILY HISTORY:  See previous record.   SOCIAL HISTORY:  The patient was a former cigarette smoker.   PAST MEDICAL HISTORY:  1. History of malignant hypertension.  2. Coronary artery disease with ischemic.  3. Congestive heart failure.  4. Stage III chronic kidney disease.  5. Chronic back pain and sciatica.  6. Chronic anxiety.  7. Insulin-dependent diabetes.  8. Peripheral vascular disease with stenting of the left subclavian      artery.  9. Hyperlipidemia.   PAST SURGICAL HISTORY:  1. Drug-eluting stent placed in the right renal artery.  2. Subsequent restenosis and additional stent inserted.  This was done      by Dr. Samule Ohm in  [place].  3. Bilateral renal artery stenosis history.  4. Stent placement in renal arteries in September 2006.  5. On June 30, 2005, she had angioplasty of right renal artery      secondary to significant restenosis.  6. In March 2007, in-stent restenosis of left renal artery treated      with cutting balloon followed by Cypher drug-eluting stent.  7. On December 07, 2005, restenosis of right renal artery.  8. Lumbar diskectomy in 1980 and 1984.  9. History of nephrolithiasis.  10 . History of osteoarthritis.   ALLERGIES:  The patient has multiple drug allergies:  BUSPAR, GLUCOTROL  XL, GLUCOPHAGE, GLYNASE, CIPRO, NOVOCAIN, PENICILLIN.   MEDICATIONS:  1. Tricor 145 mg daily.  2. Tecturna 300 mg daily.  3. Synthroid 88 mcg daily.  4. Propranolol 80 mg daily.  5. Furosemide 20 mg daily.  6. Diovan HCT 320/25 one daily.  7. Catapres-TTS patch 0.3 mg every week.  8. Aspirin 81 mg daily.  9. Alprazolam 0.5 mg every  4 hours p.r.n.  10.Lantus insulin 45 units daily.  11.Humalog subcutaneous p.r.n. according to sliding scale.   REVIEW OF SYSTEMS:  HEENT: Negative.  CARDIOPULMONARY:  The patient has  had dyspnea and cough for several days.  No hemoptysis.  GI: No bowel  irregularity or bleeding.  GU:  No dysuria or hematuria.   PHYSICAL EXAMINATION:  GENERAL:  An alert, somewhat uncomfortable, white  female.  VITAL SIGNS:  Blood pressure 130/80, pulse 72, respiration 18,  temperature 98.  HEENT:  PERRLA.  TMs negative.  Oropharynx benign.  NECK:  Supple.  No JVD or thyroid abnormalities.  HEART:  Regular rhythm, no murmurs.  LUNGS:  Clear to P&A.  ABDOMEN:  No palpable organs or masses.  No organomegaly.  EXTREMITIES:  Free of edema.   ASSESSMENT:  1. Congestive heart failure with elevation of B-type natriuretic      peptide.  2. History of malignant hypertension.  3. Insulin-dependent diabetes.  4. Upper respiratory infection sinusitis.      Angus G. Renard Matter, MD   Electronically Signed     AGM/MEDQ  D:  04/27/2007  T:  04/27/2007  Job:  [id number]

## 2010-10-05 NOTE — Cardiovascular Report (Signed)
NAMETARALYNN, QUIETT NO.:  [id number]   MEDICAL RECORD NO.:  [id number]          PATIENT TYPE:  INP   LOCATION:  6531                         FACILITY:  [facility]   PHYSICIAN:  Nanetta Batty, M.D.   DATE OF BIRTH:  [birth year]   DATE OF PROCEDURE:  09/04/2007  DATE OF DISCHARGE:                            CARDIAC CATHETERIZATION   HISTORY:  [name] is a 65 year old overweight married white female,  mother of 3 with history of hypertension; hyperlipidemia; remote tobacco  abuse, having quit 3 months ago; and hyperlipidemia.  She has had  documented normal coronary arteries by cath from Dr. Samule Ohm in 2007.  She has had left subclavian bilateral renal artery stenting as well as  restenting of her renal arteries.  She was seen because of persistent  hypertension, dyspnea, and chest pain.  Renal Doppler suggested  bilateral renal artery stenosis.  She presents now after having  undergone diagnostic tests for peripheral angiography and potential  endovascular therapy.   A 6-French sheath in right femoral artery was already in place from the  cath.  A 6-French long JR-4 catheter was used to perform selective left  subclavian, left to right renal artery angiography.  A short pigtail  catheter was used for distal abdominal aortography.  Visipaque dye was  used for the entirety of the PV case.  Aortic pressures monitored in the  case.   ANGIOGRAPHIC RESULTS:  1. Left subclavian artery widely patent with almost 20%-30% in-stent      restenosis proximally.  2. Abdominal aorta,      a.     Renal arteries - 95% ostial bilateral renal artery stenosis.      b.     Moderate infrarenal atherosclerotic changes with an       eccentric ulcerated plaque in the distal aorta.  3. Left lower extremity,      a.     Iliac artery normal.  4. Right lower extremity,      a.     Iliac artery 50% segmental stenosis.   DESCRIPTION OF PROCEDURE:  The patient received 3000 units of  heparin  intravenously.  Using a 6-French JR-4 short guide catheter and also a 14  x 90 stabilizer wire and a 4 x 1.5 Aviator balloon, PTA was performed in  the ostium of the left renal artery.  This was then stented with a 6 x  12 Genesis on Aviator balloon stent premount, deployed at the ostium  after a careful placement angiographically and fluoroscopically at 12  atmospheres.   The right renal artery was then engaged, wired, and ballooned with 4 x  1.5 Aviator and stented with a 6 x 12 Genesis on Aviator balloon stent  premount at 12 atmospheres.  Resulting reduction of bilateral 95% ostial  renal artery stenosis to 0% residual with excellent results.  The  patient tolerated the procedure well.  The guidewire and catheter  removed.  The sheath was sewn securely in place.  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  Renal function will be assessed in  the morning and if  her renal function  stays stable, she will be discharged home with titration of her  antihypertensives.  She will get followup renal Dopplers after which she  will be seen back in the office in followup.  She may need to have IVUS  interrogation of her distal abdominal aorta to evaluate the high  frequency signal found on Doppler and her claudication.      Nanetta Batty, M.D.  Electronically Signed     JB/MEDQ  D:  09/04/2007  T:  09/05/2007  Job:  [id number]   cc:   [facility]  [facility]  Lenox Ponds, MD  Ishmael Holter. Renard Matter, MD

## 2010-10-08 NOTE — Discharge Summary (Signed)
[name], [name]                         ACCOUNT NO.:  [id number]   MEDICAL RECORD NO.:  [id number]                   PATIENT TYPE:  INP   LOCATION:  4712                                 FACILITY:  [facility]   PHYSICIAN:  Noralyn Pick. Eden Emms, M.D. [facility]           DATE OF BIRTH:  09-26-1945   DATE OF ADMISSION:  01/14/2002  DATE OF DISCHARGE:  01/15/2002                           DISCHARGE SUMMARY - REFERRING   DISCHARGE DIAGNOSES:  1. Nonobstructive coronary artery disease.  2. Urinary tract infection, treated.  3. Bilateral femoral bruits.  4. Carotid artery stenosis.  5. Diabetes mellitus.  6. Hypertension, treated.  7. Hyperlipidemia.  8. Obesity.  9. Hypothyroidism.  10.      Anxiety and depression.   HISTORY OF PRESENT ILLNESS:  The patient is a 65 year old female who was  initially admitted to [facility] on 01/11/2002 under the care of  Dr. Megan Mans.  She was admitted because of exertional chest pain and because  of her risk factors including hyperlipidemia, hypertension, diabetes,  obesity and coronary artery disease she was transported to [facility] for  cardiac catheterization on 01/14/2002.   On 01/14/2002 she underwent cardiac catheterization which revealed  nonobstructive coronary artery disease with 40% proximal LAD stenosis with a  normal ejection fraction.   In addition she had a urinary tract infection and the culture and  sensitivities did reveal Staph aureus that was resistant to oxacillin.  After further consultation with Dr. Norm Salt, pharmacist at [facility], we decided to  proceed with 1 g of vancomycin prior to her departure and then place her on  Macrobid 100 mg 1 p.o. b.i.d.  She is allergic to CIPRO and PENICILLIN.  After treatment she will need a followup UA with C&S.  I suspect because of her diabetes, she was not aware of her urinary tract  infection and will need a repeat specimen obtained at her followup visit.   She is also noted to have  bilateral femoral bruits on exam prior to cardiac  catheterization.  She will need to have lower extremity arterial Doppler set  up at her followup visit.  She also has carotid stenosis.  This can be set  up the same day to assure no further progression of her disease.   LABS ON DISCHARGE:  Sodium 137, potassium 4.0, BUN 14, creatinine 0.9.  Cardiac isoenzymes and troponins were negative.  Total cholesterol was 154,  triglycerides 295, LDL 54, HDL 41.  TSH 7.764.  White count 8.3, hemoglobin  14.3, hematocrit 41.5 and platelets 199.   DISCHARGE MEDICATIONS:  Essentially unchanged from her admission medications  which include Propranolol SA 120 mg q.d., Ativan 0.5 mg p.o. q. 6h p.r.n.,  multivitamin 1 p.o. q.d., aspirin, Synthroid 80 mcg q.d., Zocor 40 mg  q.h.s., Chlorthalidone 25 mg alternate days, Humulin 70/30 10 units in the  morning and 10 units at bedtime.  Her only medication addition will be  Macrobid 100 mg 1 p.o. b.i.d. for 14 days.   ALLERGIES:  PENICILLIN, CIPRO, CODEINE, GLUCOPHAGE, GLYNASE, LIDOCAINE AND  NORVASC.   DISCHARGE INSTRUCTIONS:  She is instructed to use Tylenol as needed for  pain.  No strenuous activity for two days then gradually increase her  activity.  Remain on a low fat, diabetic diet.  Clean her cath site with  soap and water and follow up with Dr. Dietrich Pates in one week.     Guy Franco, P.A. [facility]                      Peter C. Eden Emms, M.D. [facility]    LB/MEDQ  D:  01/15/2002  T:  01/16/2002  Job:  [id number]   cc:   Gerrit Friends. Dietrich Pates, M.D. [facility]  [address]  [place]  [place] [address]  Fax: 1   Foster Simpson, M.D.  Venita Sheffield, [place]

## 2010-10-08 NOTE — Group Therapy Note (Signed)
   [name], Anita                         ACCOUNT NO.:  [id number]   MEDICAL RECORD NO.:  [id number]                   PATIENT TYPE:  INP   LOCATION:  A204                                 FACILITY:  [facility]   PHYSICIAN:  Fredirick Maudlin, M.D.              DATE OF BIRTH:  1945/09/03   DATE OF PROCEDURE:  DATE OF DISCHARGE:                                   PROGRESS NOTE   PROBLEM:  1. Chest pain.  2. Diabetes mellitus.  3. Hypertension.   SUBJECTIVE:  Ms. [name] says that she is feeling pretty well but she had  some burning in her urine yesterday that she felt was a urinary tract  infection. She says that otherwise, she is doing okay and has no other new  complaints. Denies any chest pain now and says that she is feeling fairly  well. She is having some problem with nicotine withdrawal and I offered a  patch but she does not want to do that right now.   OBJECTIVE:  Her examination shows that her blood pressure is 147/68, pulse  60, blood sugar is running between 124 and 294. Her urine was actually  negative but I did go ahead and put her on Bactrim DS. Her chest is clear.  Heart is regular.   ASSESSMENT:  She is ruled out for myocardial infarction. Her labs are  negative. She may have a urinary tract infection. Her blood sugar is fair.  Her blood pressure is better.   PLAN:  Go ahead with current treatments. Try to see if we can get a [facility]  Cardiology Consultation. She has seen them in the past and she may well  require something like a Cardiolite stress test. I am going ahead and hold  her NPO and tomorrow see if we can get a Cardiolite scheduled.                                               Fredirick Maudlin, M.D.    ELH/MEDQ  D:  01/13/2002  T:  01/13/2002  Job:  [id number]

## 2010-10-08 NOTE — Progress Notes (Signed)
[facility]                          PERIPHERAL VASCULAR OFFICE NOTE   NAME:Midkiff, DEYSI SOLDO                     MRN:          [id number]  DATE:01/12/2006                            DOB:          [birth year]    HISTORY OF PRESENT ILLNESS:  Ms. Vossler is a 65 year old lady with diffuse  peripheral vascular disease.  She is status post bilateral renal artery  stenting, both of which have re-tensed and subsequently have been treated  with drug-eluting stents.  She is also status post left subclavian stenting.   Ms. Lobello continues to say that she feels poorly.  She and her husband  have recently separated.  She says that she develops chest discomfort with  stressful situations which have been frequent lately.  A coronary angiogram  recently demonstrated normal left systolic function with minimal coronary  disease.   PAST MEDICAL HISTORY:  1. Renal artery stenosis, status post bilateral renal artery stenting by      Dr. Charlett Nose in 2006.  2. Bilateral restenosis, treated with bilateral drug-eluting stents.  3. Status post left subclavian stenting in March 2007.  4. Labile hypertension.  5. Non-obstructive coronary artery disease with preserved left ventricular      systolic function by catheterization in July 2007.  6. Hypercholesterolemia.  7. Type 2 diabetes mellitus requiring insulin.  8. Treated hypothyroidism.  9. Anxiety with panic disorder.  10.Chronic back pain.  11.Ongoing tobacco abuse.   CURRENT MEDICATIONS:  1. Synthroid 88 mcg daily.  2. Aspirin 81 mg daily.  3. Diovan/hydrochlorothiazide 320/25 mg, one daily.  4. Vytorin 10/20 mg, one daily.  5. Lantus insulin 52 units daily.  6. Inderal 80 mg b.i.d.  7. Multivitamin daily.  8. Flax seed oil daily.   PHYSICAL EXAMINATION:  GENERAL:  She is generally well-appearing,n no  distress.  VITAL SIGNS:  Heart rate 58, blood pressure 148/68.  HEENT:  Normal.  SKIN:  Normal.  NECK:  She has no jugular venous distention, no thyromegaly, no  lymphadenopathy.  Carotid pulses 2+ bilaterally without bruits.  LUNGS:  Respiratory effort is normal.  Lungs are clear to auscultation.  HEART:  She has a non-displaced point of maximal cardiac impulse.  There is  a regular rate and rhythm without murmur, rub or gallop.  ABDOMEN:  Soft, non-distended, nontender.  There is no hepatosplenomegaly.  Bowel sounds are normal.  There is no abdominal bruit.  No pulsatile  abdominal mass.  EXTREMITIES:  Warm, without clubbing, cyanosis or edema.  No ulceration.  Femoral pulses 2+ bilaterally, without bruit.  NEUROLOGIC:  She is alert and oriented x3 with normal affect.  Neurological  exam is normal.   A renal artery duplex examination performed today demonstrates the right  renal stent to be widely patent.  There appear to be elevated velocities in  the left renal artery with a peak systolic velocity of 452 cm per second.   IMPRESSION/RECOMMENDATIONS:  1. Renal artery stenosis:  The right renal artery stent is widely patent.      Today's duplex suggests a restenosis on the left.  I think this  unlikely, however, since it looked widely patent by invasive angiogram      just six weeks ago.  Her blood pressure is not substantially worse.      Will thus watch her renal function and blood pressure over the months      to come.  2. Hypertension:  Remains sub-optimally controlled.  I think her continued      smoking and anxiety are contributors.  These are clearly not the only      problem, however.  She is feeling overwhelmed by her medial conditions      at present.  Her blood pressure control is better now than it has been      for some time.  I think her feeling overwhelmed is in the long run      counter-productive.  Will therefore make no changes in her medications      today.  3. 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  4. Work:  I told the patient that she has no contraindications to      returning to work and wrote her a note to that effect today.                                   Salvadore Farber, MD   WED/MedQ  DD:  01/12/2006  DT:  01/13/2006  Job #:  [id number]   cc:   Angus G. Renard Matter, MD

## 2010-10-08 NOTE — H&P (Signed)
[name], [name]                         ACCOUNT NO.:  [id number]   MEDICAL RECORD NO.:  [id number]                   PATIENT TYPE:  INP   LOCATION:  A204                                 FACILITY:  [facility]   PHYSICIAN:  Isidor Holts, MD                 DATE OF BIRTH:  06-28-1945   DATE OF ADMISSION:  01/11/2002  DATE OF DISCHARGE:                                HISTORY & PHYSICAL   CHIEF COMPLAINT:  Chest pain for the past four hours.   HISTORY OF PRESENT ILLNESS:  A 65 year old Caucasian female, known diabetic  and hypertensive, has been having recurrent chest pain for the past two to  three months.  She put this down to anxiety or indigestion, usually of brief  duration.  She had been to see Dr. Dietrich Pates, cardiologist, on January 08, 2002 for management of hypertension but was afraid to mention these  episodes.  This afternoon at about 1 p.m. while sitting and watching T.V.  she developed central chest pain which she described like pressure.  No  irradiation, mild.  This continued on until she went to work.  Then, at  about 5:30 p.m. while pushing a heavy load at work, the chest pain  intensified, she felt mild short of breath, was not diaphoretic, was not  nauseated.  She went and sat down with significant relief.  The pain,  however, lasted until she came to the emergency room at [facility]  and was commenced on oxygen with significant relief.   PAST MEDICAL HISTORY:  1. Hypertension.  2. Hyperlipidemia.  3. Hypothyroidism.  4. Type 2 diabetes mellitus diagnosed at age 63 years; however, she had     gestational diabetes mellitus at age 37 years.  5. Anxiety.  6. Right carotid stenosis diagnosed three years ago.  7. Status post lumbar diskectomy in 1980 and 1984.  8. Status post lithotripsy for nephrolithiasis in 1999.   MEDICATIONS:  1. Propanolol SA 120 mg q.d.  2. Ativan 0.5 mg p.o. q.6h. p.r.n.  3. Multivitamin one tablet p.o. q.d.  4. Aspirin one  tablet alternate days.  5. Synthroid 88 mcg q.d.  6. Zocor 40 mg q.h.s.  7. Chlorthalidone 25 mg alternate days.  8. Humulin 70/30 10 units q.a.c.b. and 15 units q.h.s. subcutaneously.   ALLERGIES:  This lady has multiple drug allergies or intolerances.  Notable  ones are:  PENICILLIN, CIPROFLOXACIN, CODEINE, GLUCOPHAGE, GLYNASE,  LIDOCAINE, NORVASC.   SOCIAL HISTORY:  Married with three offspring, all of whom are alive and  well.  She is a smoker, one pack of cigarettes per day.  Denies alcohol use,  denies drug abuse.   FAMILY HISTORY:  She has a strong family history of diabetes mellitus and  hypertension and her mother has coronary artery disease.   PHYSICAL EXAMINATION:  VITAL SIGNS:  Temperature 98.1, pulse 85 per minute,  respirations 16 per  minute.  Blood pressure 211/91 initially, subsequently  154/69.  GENERAL:  She appeared comfortable at the time of examination, not short of  breath at rest, communicative, cooperative, and confirms that the pain was  now gone.  HEENT:  No clinical pallor, no jaundice, no conjunctival injection.  NECK:  Supple.  No palpable lymphadenopathy, no palpable goiter.  JVP is not  seen.  She does have bilateral carotid bruits.  CHEST:  Clinically clear to auscultation.  No wheezes, no crackles.  HEART:  Heart sounds are normal, regular.  No murmurs.  ABDOMEN:  Soft, nontender.  There was no palpable organomegaly and bowel  sounds are normal.  LOWER EXTREMITIES:  Unremarkable.   LABORATORY INVESTIGATIONS:  Chest x-ray:  Rotated to the left; otherwise,  normal.  EKG:  Sinus rhythm, regular, normal axis, 75 per minute, Q waves in  V1-V3, T flattening/inversion in standard lead I, and chest leads V4-V6.   Total CK 104, CK-MB 4.0, troponin I 0.02.  Chemistry:  Sodium 133, potassium  3.5, chloride 101, carbonate 28, BUN 10, creatinine 0.7, glucose 227.  LFTs  normal.  CBC:  WBC 8.3, hemoglobin 14.3, hematocrit 41.5, platelets 199.   ASSESSMENT  AND PLAN:  Admit to telemetry unit.   1. Chest pain, rule out coronary artery disease.  Will manage with oxygen,     aspirin, Lovenox, sublingual nitroglycerin p.r.n., complete rule out     myocardial infarction protocol.  Request cardiology consultation to be     provided by Dr. Dietrich Pates as he is familiar with this patient, i.e. from     [facility].  2. Uncontrolled hypertension.  Will continue her beta blocker therapy and     increase chlorthalidone to 25 mg q.d.  3. Type 2 diabetes mellitus.  Manage with sliding scale insulin and ADA diet     for now.  4. Hyperlipidemia/hypothyroidism/chronic anxiety - stable.  Will continue     current treatment.   Further management will, of course, depend on clinical course.                                               Isidor Holts, MD    CO/MEDQ  D:  01/11/2002  T:  01/12/2002  Job:  [id number]   cc:   Angus G. Renard Matter, M.D.

## 2010-10-08 NOTE — Assessment & Plan Note (Signed)
[facility]                            CARDIOLOGY OFFICE NOTE   NAME:Nakagawa, AMBREE FRANCES                     MRN:          [id number]  DATE:05/29/2006                            DOB:          07/21/1945    PRIMARY CARE PHYSICIAN:  Angus G. Renard Matter, MD   HISTORY OF PRESENT ILLNESS:  Ms. Cressy is a 65 year old lady with  diffuse atherosclerotic vascular disease. She has had prior bilateral  renal artery stenting for which she suffered restenosis and has been  treated with drug-eluting stents. Angiogram in July 2007, showed  approximately 40% in-stent restenosis of the left renal artery drug-  eluting stent. Ultrasound, within a few weeks, showed a high velocity,  but lower renal aortic ratio. Her blood pressure has fluctuated since  then. Since we demonstrated it to be patent with catheter angiography  and catheter-based pressure assessment just a few weeks before that  ultrasound, I did not feel that it represented restenosis.   Ms. Roskos presented this Friday night to the hospital with  hypertension and fatigue. She was treated with Imdur. Blood pressure had  declined to 120 and she was discharged home. However, blood pressure  remains elevated at home and she presents today for further evaluation.   CURRENT MEDICATIONS:  1. Imdur 15 mg daily, which was started Friday.  2. Propranolol 80 mg twice per day.  3. Multivitamin.  4. Aspirin 81 mg per day.  5. Hydrocortisone cream.  6. Flax seed oil.  7. Diovan/hydrochlorothiazide at 320/25 one per day.  8. Vytorin 10/20 one per day.  9. Synthroid 88 mcg per day except 44 on Monday and Friday.  10.Insulin sliding scale.  11.Lantus insulin.   PHYSICAL EXAMINATION:  She is generally well-appearing in no acute  distress. Heart rate of 73, blood pressure 204/84 in the left and 181/79  in the right.  She has no jugulovenous distention, thyromegaly or lymphadenopathy.  LUNGS:  Clear to auscultation.  Respiratory effort is normal.  She has a nondisplaced point of maximal cardiac impulse. There is a  regular rate and rhythm with normal S1, S2. There is no murmur or S3.  There is a moderately loud S4.  ABDOMEN: Soft, nondistended and nontender. There is no  hepatosplenomegaly and no abdominal bruit. There is no midline pulsatile  mass.  EXTREMITIES: Are warm and without edema.   Renal duplex today demonstrates peak systolic velocity on the right of  165 cm/second and on the left of 348 cm/second. Renal aortic ratio on  the left is only 2.4. Kidney sizes are 10.1 x 4.3 on the right and 10 x  5.1 on the left. Good flow is seen throughout the parenchyma of both  kidneys.   IMPRESSION/RECOMMENDATIONS:  Malignant hypertension: Based on the lower  velocities today than we saw in August, and the intermittent control of  her blood pressure during that period, I do not think her moderate left  renal artery restenosis is a contributor to her difficult to control  hypertension. Will therefore manage the renal artery conservatively.   As for controlling her hypertension, we are limited  by multiple drug  intolerances. After lengthy conversation, we have agreed to attempt an  increased dose of her Inderal to 160 mg per day. She believes this may  have been effective a number of years ago. Will therefore attempt it. I  will plan on seeing her back in a weeks' time. I have told her she can  return to work without restriction.     Salvadore Farber, MD  Electronically Signed    WED/MedQ  DD: 05/29/2006  DT: 05/29/2006  Job #: [id number]   cc:   Angus G. Renard Matter, MD

## 2010-10-08 NOTE — Group Therapy Note (Signed)
   NAME:  [name], [name]                         ACCOUNT NO.:  [id number]   MEDICAL RECORD NO.:  [id number]                   PATIENT TYPE:  INP   LOCATION:  A204                                 FACILITY:  [facility]   PHYSICIAN:  Angus G. Renard Matter, M.D.              DATE OF BIRTH:  1945-10-01   DATE OF PROCEDURE:  DATE OF DISCHARGE:  01/14/2002                                   PROGRESS NOTE   IMPRESSION:  This patient was admitted with anterior chest pain to rule out  coronary artery disease.  She was thought to have uncontrolled hypertension,  type 2 diabetes, hyperlipidemia.  Her cardiac enzymes remained normal.   PLAN:  Proceed with cardiology consult today, possible stress test,  Cardiolite.                                               Angus G. Renard Matter, M.D.    AGM/MEDQ  D:  01/14/2002  T:  01/14/2002  Job:  [id number]

## 2010-10-08 NOTE — Cardiovascular Report (Signed)
NAMEALEXAH, [name] NO.:  [id number]   MEDICAL RECORD NO.:  [id number]          PATIENT TYPE:  OIB   LOCATION:  1961                         FACILITY:  [facility]   PHYSICIAN:  Salvadore Farber, M.D. LHCDATE OF BIRTH:  June 08, 1945   DATE OF PROCEDURE:  11/25/2005  DATE OF DISCHARGE:                              CARDIAC CATHETERIZATION   PROCEDURE:  Left heart catheterization, left ventriculography, coronary  angiography, selective bilateral renal angiography, selective left  subclavian angiography.   INDICATIONS:  [name] is a 65 year old woman with diffuse  atherosclerotic vascular disease, but without previously-documented coronary  artery disease.  She presents with chest pain in the setting of severe  hypertension.  She has called EMS twice within the past 2 weeks with severe  chest discomfort, but has refused transport to the hospital.  Given her  known atherosclerosis, I referred her for diagnostic angiography of her  coronaries with planned assessment of her renal and subclavian stents.   PROCEDURAL TECHNIQUE:  Informed consent was obtained.  Under 1% lidocaine  local anesthesia, a 4-French sheath was placed in the left common femoral  artery using the modified Seldinger technique.  Coronary angiography and  ventriculography were performed using JL-4, JR-4, and pigtail catheters.  The 3D-RCA catheter was used to selectively engage the left subclavian  artery, and then both the right and left renal arteries.  The patient was  then transferred to the holding room in stable condition having tolerated  the procedure well.  She was quite hypertensive on transfer, and we were  using hydralazine to improve her blood pressure.   COMPLICATIONS:  None.   FINDINGS:  1.  LV:  186/19/25.  EF 70% without regional wall abnormality.  2.  Left main:  Angiographically normal.  3.  LAD:  Moderate-sized vessel giving rise to 3 fairly small diagonals.  It      is  angiographically normal.  4.  Circumflex:  Moderate-sized vessel giving rise to 1 large branching      obtuse marginal.  It is angiographically normal.  5.  RCA:  Large, dominant vessel.  It is angiographically normal.  6.  Left subclavian artery.  The proximal portion of the subclavian is      previously stented.  There is approximately 20-30% in-stent restenosis.  7.  Left renal artery:  Status post Cypher drug-eluting stent placement for      in-stent restenosis.  This is widely patent.  8.  Right renal artery:  There is 90% in-stent restenosis diffusely.   IMPRESSION/PLAN:  The patient has angiographically normal coronary arteries  and normal left ventricular systolic function.  The left ventricular end-  diastolic pressure is elevated in the setting of severe systemic  hypertension.  She has severe in-stent restenosis in the right renal artery.  I will plan revascularization of her right renal artery within the coming  weeks with the goal of improving her blood pressure control.      Salvadore Farber, M.D. [facility]  Electronically Signed     WED/MEDQ  D:  11/25/2005  T:  11/25/2005  Job:  [id number]   cc:   Angus G. Renard Matter, MD  Fax: [phone]

## 2010-10-08 NOTE — Group Therapy Note (Signed)
   NAME:  [name], [name]                         ACCOUNT NO.:  [id number]   MEDICAL RECORD NO.:  [id number]                   PATIENT TYPE:  INP   LOCATION:  A204                                 FACILITY:  [facility]   PHYSICIAN:  Angus G. Renard Matter, M.D.              DATE OF BIRTH:  04/27/1946   DATE OF PROCEDURE:  05/24/2002  DATE OF DISCHARGE:                                   PROGRESS NOTE   SUBJECTIVE:  This patient was admitted to the hospital with shortness of  breath with what was felt to be bronchopneumonia, left lower lobe  infiltrate, and pleural effusions.  Her CT scan was negative for evidence of  pulmonary embolization.   OBJECTIVE:  VITAL SIGNS:  Blood pressure 157/55, respirations 18, pulse 67,  temperature 98.5.  HEART:  Regular rhythm.  LUNGS:  Clear to P&A.  ABDOMEN:  No palpable organs or masses.   ASSESSMENT:  The patient was admitted with shortness of breath,  bronchopneumonia in left lower lobe.   PLAN:  Plan to continue current intravenous antibiotics, continue to monitor  for improvement.                                               Angus G. Renard Matter, M.D.    AGM/MEDQ  D:  05/24/2002  T:  05/24/2002  Job:  [id number]

## 2010-10-08 NOTE — Assessment & Plan Note (Signed)
[facility]                                 ON-CALL NOTE   NAME:[name], [name]                     MRN:          [id number]  DATE:06/22/2006                            DOB:          1945-06-03    I saw [name] yesterday for her malignant hypertension.  I  increased her hydralazine from 20 to 50 mg three times per day and  continued her other medications.  She called today at 1:19 p.m.  complaining of palpitations and chest discomfort.  She said she is  having left-sided chest pressure without associated dyspnea, nausea or  diaphoresis.  She says it is more severe than the chest discomfort she  has had in the past.  I advised that she call 911 and present to the  emergency room for further evaluation.     Salvadore Farber, MD  Electronically Signed    WED/MedQ  DD: 06/22/2006  DT: 06/22/2006  Job #: [id number]

## 2010-10-08 NOTE — Discharge Summary (Signed)
NAMEESSICA, KIKER NO.:  [id number]   MEDICAL RECORD NO.:  [id number]          PATIENT TYPE:  INP   LOCATION:  2003                         FACILITY:  [facility]   PHYSICIAN:  [place] Bing, M.D. LHCDATE OF BIRTH:  20-Jul-1945   DATE OF ADMISSION:  06/29/2005  DATE OF DISCHARGE:  07/02/2005                           DISCHARGE SUMMARY - REFERRING   HISTORY OF PRESENT ILLNESS:  [name] is a 65 year old female who was  transferred from [facility] emergency room for a cardiac  catheterization to evaluate her chest discomfort and possible myocardial  infarction. She was initially evaluated by her primary care physician for  some throat discomfort and hypotension and blood work came back as normal.  Thus, she was referred to the emergency room for further evaluation. EKG in  the emergency room revealed new J-point elevation in the anteroseptal leads  with new T wave inversion in the lateral leads, compared to an EKG in 2003.  However, initial markers at [facility] were unremarkable. Her blood  pressure at [facility] was initially 252/67.   PAST MEDICAL HISTORY:  1.  Non-obstructive coronary artery disease. Prior atheterization showed a      40% proximal LAD with normal LV function.  2.  Bilateral renal artery stenosis with stenting in September 2006 with      right carotid stenosis.  3.  Hypothyroidism.  4.  Insulin-dependent diabetes.  5.  Hypertension.  6.  Hyperlipidemia.  7.  Nephrolithiasis.  8.  Continued tobacco use.  9.  Anxiety.  10. Chronic lower extremity edema.  11. Status post lumbar diskectomy.   LABORATORY DATA:  A chest x-ray on February 7th did not show any acute  findings.   At [facility] on the 7th, H&H was 16.7 and 49.6. Normal indices.  Platelets 232,000. WBC is 8.9. Subsequent hematologies were essentially  unremarkable at the time of discharge. H&H was 12.7 and 37.3. Normal  indices. Platelets  159,000. WBC 7.8.  At [facility], PTT was  greater than 200. PT 14.5. INR 1.1. [facility] sodium was 136,  potassium 5.0, BUN 36, creatinine 1.7, glucose 182. Prior to discharge,  sodium 139, potassium 4.8, BUN 24, creatinine 1.5, glucose 162. (It is noted  BUN and creatinine in October 06 was 10 and 0.8. CK MB's and relative index  x2 were negative for myocardial infarction. Troponin's were slightly  elevated at 0.08 and 0.08. Fasting lipids showed a total cholesterol of 134,  triglycerides 220, HDL 38, LDL 52. TSH was slightly elevated at 6.417.   EKG's as previously described.   HOSPITAL COURSE:  [name] was placed on IV heparin and transferred to  [facility] for further evaluation. Her Diovan was held as well as  her Lasix and Aldactone. She was hydrated and placed on IV Labetalol.  Overnight, the patient did not have any further chest discomfort or  shortness of breath. She did experience some slight nausea. This was  associated with a low blood sugar. It was felt that she should undergo  cardiac catheterization when stable, given  the increase of her creatinine,  consideration was also given to looking at her renal arteries. Diovan,  Lasix, and aldactone were held. Blood pressure had improved overnight and  was 140/56. On June 30, 2005, cardiac catheterization was performed by  Dr. Samule Ohm. This showed approximately 40% In-Stent restenosis of the left  renal and a 99% In-Stent restenosis of the right renal. This was reduced  utilizing a cutting balloon. She had a 30% mid LAD, a 20% distal LAD, and a  70% proximal diagonal 1, which was felt to be non-obstructive coronary  artery disease. Dr. Samule Ohm noted to watch for a precipitous blood pressure  drop, to discontinue the Clonidine and nitroglycerin. Continue Labetalol and  titrate as needed. It was felt that she should remain in the hospital until  her blood pressure stabilizes. Cardiac rehabilitation  assisted with  education and ambulation. By July 01, 2005, blood pressure was ranging  from 135 to 165 over 51 to 65 systolic. She did not have any further chest  discomfort and catheterization site was intact. TSH was noted to be slightly  elevated. However, did not make new changes in her mediations during this  admission. Tobacco cessation consult was also performed. On July 01, 2005, Dr. Samule Ohm reviewed her medications for discharge planning and  outlined a plan. He wished her to keep her scheduled ultrasound appointment  for July 06, 2005 to establish post stenting baseline as well as a B-  met. By July 02, 2005, she was ambulating without difficulty. Initial  blood pressure on the morning of the 10th was 140/80. Dr. Dietrich Pates began  Labetalol 300 mg b.i.d. and discontinued her Lisinopril. It was felt that  she could be discharged home.   DISCHARGE DIAGNOSES:  1.  Malignant hypertension.  2.  Non-obstructive coronary artery disease status post PCI to In-Stent      restenosis of the right renal artery.  3.  Continued tobacco use.  4.  Hyperlipidemia.  5.  Renal insufficiency.   PROCEDURES:  Cardiac catheterization and renal angiogram as described below  by Dr. Samule Ohm on June 30, 2005.   DISPOSITION:  She is discharged home.   DIET:  Asked to maintain a low-salt, low-fat, low-cholesterol ADA diet.   SPECIAL INSTRUCTIONS:  She is asked to keep her blood pressure diary twice a  day and to bring all blood pressure medications and her blood pressure's to  all appointments. She was advised no smoking or tobacco products.   DISCHARGE MEDICATIONS:  1.  Levothyroxine 0.088/0.044 mg on alternating days.  2.  Lantus 40 units q.h.s.  3.  Vytorin 10/40 q.h.s.  4.  Aspirin 81 mg daily.  5.  Lasix 10 mg daily.  6.  She is asked to decrease her Diovan to 160 mg daily.  7.  Also received a new prescription for Labetalol 300 mg b.i.d. 8.  She is instructed NOT to take her  Clonidine, Aldactone, or Propranolol.   FOLLOW UP:  Our office will call her on Monday to re-confirm her appointment  on July 06, 2005 for renal ultrasound. We will also check a B-met that  day. She will also need carotid Doppler's and we will attempt to have these  performed on the same day, July 06, 2005, but they may be performed at a  later date. Dr. Dietrich Pates also wishes to see her in the office on July 07, 2005. A message to this affect will also be left at the office. She is  asked to call Dr. Renard Matter for a 4 to 5 week appointment for continued  followup of her hypertension.   ACTIVITY:  She is given instructions that she may return to work on Monday,  however, to avoid driving for 2 days, no lifting for 1 week, no sexual  activity for 1 week. May resume activity slowly, and shower and walk up  stairs.      Joellyn Rued, P.A. [facility]      [place] Bing, M.D. [facility]  Electronically Signed    EW/MEDQ  D:  07/02/2005  T:  07/02/2005  Job:  [id number]   cc:   Willa Rough, M.D.  [address]  [address]  [place]  [place] [address]   [place] Bing, M.D. [facility]  [address]  [address]  [place]  [place] [address]   Angus G. Renard Matter, MD  Fax: [phone]

## 2010-10-08 NOTE — Progress Notes (Signed)
[facility]                        PERIPHERAL VASCULAR OFFICE NOTE   NAME:[name], [name]                     MRN:          [id number]  DATE:06/21/2006                            DOB:          04/16/1946    PRIMARY CARE PHYSICIAN:  [name], M.D.   HISTORY OF PRESENT ILLNESS:  [name] is a 65 year old woman with  diffuse atherosclerotic vascular disease and malignant hypertension.  She has drug-eluting stents in both renal arteries after twice  restenosing bare metal stents.  She has multiple medication intolerances  which have contributed to the difficulty in controlling her  hypertension.  When I saw her 1 week ago, we increased her hydralazine  to 20 mg three times per day.  With that, her blood pressures at home  have been better controlled.  They have ranged from 162/60 to as high as  210/95.  The majority have been less than 190 systolic, which is an  approximately 15 mmHg improvement.   CURRENT MEDICATIONS:  1. Lantus insulin 40 units per day.  2. Sliding scale Humalog p.r.n.  3. Synthroid 88 mcg per day, except 44 on Monday and Friday.  4. Zocor 40 mg daily.  5. Propranolol 80 mg twice per day.  6. Aspirin 81 mg per day.  7. Multivitamin.  8. Flaxseed oil 1000 mg per day.  9. Diovan HCTZ 320/25 one per day.  10.Hydralazine 20 mg three times per day.  11.Multivitamin.   PHYSICAL EXAMINATION:  GENERAL:  She is generally appearing.  Continues  to smell of tobacco smoke.  VITAL SIGNS:  Heart rate 71, blood pressure 190/80, equal bilaterally.  NECK:  She has no jugular venous distention, thyromegaly or  lymphadenopathy.  LUNGS:  Clear to auscultation.  Her respiratory effort is normal.  HEART:  She has a nondisplaced point of maximal cardiac impulse.  There  is a regular rate and rhythm with normal S1 and S2.  There is no murmur  or S3.  The S4 is moderately loud.  ABDOMEN:  The abdomen is soft, nondistended, nontender.   There is no  hepatosplenomegaly and no abdominal bruit.  No midline pulsatile mass.  EXTREMITIES:  Warm without edema.   IMPRESSION/RECOMMENDATIONS:  1. Malignant hypertension.  Increase hydralazine to 50 mg three times      a day.  I will have her follow up with [name], R.N., in 2      weeks.  Will plan on checking BMET at that time.  If blood pressure      remains uncontrolled, would consider adding Norvasc.  She has      previously not liked that due to edema; however, I think it may be      worth that mild side effect.     Anita Farber, MD  Electronically Signed    WED/MedQ  DD: 06/21/2006  DT: 06/21/2006  Job #: [id number]   cc:   Anita G. Renard Matter, MD

## 2010-10-08 NOTE — H&P (Signed)
[name], [name] NO.:  [id number]   MEDICAL RECORD NO.:  [id number]          PATIENT TYPE:  INP   LOCATION:  3730                         FACILITY:  [facility]   PHYSICIAN:  Anita Friends. Dietrich Pates, MD, FACCDATE OF BIRTH:  10/23/1945   DATE OF ADMISSION:  05/26/2006  DATE OF DISCHARGE:                              HISTORY & PHYSICAL   Primary cardiologist:  Salvadore Farber, MD.  Primary care physician:  Ishmael Holter. McInnis, MD.   PATIENT PROFILE:  A 65 year old married Caucasian female with history of  bilateral renal artery stenosis, status post stenting, with restenosis  on the left on two separate occasions, who presents to the ER this  evening with chest pain and hypertensive urgency.   PROBLEM LIST:  1. Hypertensive urgency/labile hypertension.  2. Nonobstructive coronary artery disease by cardiac catheterization      November 25, 2005, with EF 70%.  3. Bilateral renal artery stenosis.      a.     Status post bilateral drug-eluting stent placement to the       renal arteries in September 2006.      b.     June 30, 2005, cutting balloon angioplasty to the right       renal artery secondary to significant restenosis.      c.     August 03, 2005, in-stent restenosis in the left renal artery       treated with a cutting balloon followed by 3.5 x 23 mm Cypher drug-       eluting stent.      d.     December 07, 2005, restenosis in the right renal artery with       subsequent 3.5 x 23 mm Cypher drug-eluting stent placement.  4. Peripheral vascular disease.      a.     August 03, 2005 PTA and stenting of the left subclavian       artery with placement of a 7.0 x 29 mm Genesis stent as well as a       7.0 x 18 mm Genesis bare metal stent.  5. Hypertension.  6. Hyperlipidemia.  7. Type 2 diabetes mellitus.  8. Hypothyroidism.  9. Anxiety and panic disorder.  10.Chronic back pain.  11.Ongoing tobacco abuse, currently one pack per day with previous 20      pack-year  history.  12.Status post lumbar diskectomy in 1980 and 1984.  13.History of nephrolithiasis.  14.Osteoarthritis.   HISTORY OF PRESENT ILLNESS:  A 65 year old married Caucasian female with  a history of peripheral vascular disease and bilateral renal artery  stenosis, status post PTA and repeat PTA and drug-eluting stent  placement as well as labile hypertension, who over the past couple of  weeks has noted increase in systolic blood pressures into the 200s at  times.  She says that since August her pressures were in 130-160 but it  has been creeping up.  That today she was talking on the phone with her  daughter when she developed sudden onset of 5/10 substernal chest  heaviness with mild shortness of breath similar  to her previous chest  pain symptoms.  She checked her blood pressure and noted it to be  245/120 and then called EMS.  On their arrival, repeat blood pressure  revealed the same thing and she was treated with 3 sublingual  nitroglycerin sprays without change in chest pain.  She was then taken  to the [facility] ED, where blood pressure was 191/87 and her chest pain  and gone down to 2/10.  She continues to complaint of 2/10 chest pain  and the sensation as though she cannot get enough air, although she is  not having any labored breathing or shortness of breath per se.  ECG  shows inferolateral T-wave inversion in comparison to old ECGs.  There  was not inferior T-wave inversion in the past.  Cardiac enzymes are  negative x1.  She otherwise appears comfortable.   ALLERGIES:  GLUCOPHAGE, GLYNASE, CODEINE, LABETALOL, LIDOCAINE,  NOVOCAIN, MORPHINE, CIPRO, PENICILLIN, and NORVASC.   HOME MEDICATIONS:  1. Alprazolam 0.5 mg q.4h. p.r.n.  2. Aspirin 81 mg daily.  3. Diovan/HCT 320/25 mg daily.  4. Humalog sliding scale insulin.  5. Lantus 30 units q.h.s.  6. Propranolol 80 mg b.i.d.  7. Synthroid 88 mcg daily.  8. Vytorin 10/20 mg daily.  9. Nitroglycerin sublingual  p.r.n. chest pain.   FAMILY HISTORY:  Mother is age 63 with a history of hypertension and  possibly angina.  Father died of an MI and 63s.  She has one sister who  has hypertension.   SOCIAL HISTORY:  She lives in [place] with her husband.  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  She has three grown children and has  a 20 pack-year history of tobacco abuse, currently smoking about a pack  a day.  She denies any alcohol or drug use and does not routinely  exercise.   REVIEW OF SYSTEMS:  Positive for a floater and flashes in the left eye,  which are not necessarily new.  She did have a headache after receiving  sublingual nitroglycerin.  She has chest pain, shortness of breath and  presyncope and palpitations occasionally.  She has polyuria with  straining.  She reports myalgias and arthritic pain.  She has  intermittent diarrhea.  All other systems were reviewed and negative.   PHYSICAL EXAMINATION:  VITAL SIGNS:  Temperature 98.3, heart rate 59,  respirations 16, blood pressure 191/87, pulse oximetry 98% 2 meters.  GENERAL:  Pleasant white female in no acute distress.  Awake, alert and  oriented x3.  NECK:  Normal carotid upstrokes with a soft left carotid bruit.  No JVD.  LUNGS:  Respirations were unlabored, clear to auscultation.  CARDIAC:  Regular S1, S2, with a 2/6 systolic ejection murmur at the  bilateral upper sternal border.  ABDOMEN:  Round, soft, nontender, nondistended.  Bowel sounds present  x4.  No bruits.  EXTREMITIES:  Warm, dry, pink.  No clubbing, cyanosis or edema.  Dorsalis pedis and posterior tibial pulses 1+ and equal bilaterally.  She does have bilateral femoral bruits.   Chest x-ray shows cardiomegaly and mild CHF.  EKG shows sinus  bradycardia with normal axis and rate of 57 beats per minute.  She has  ST depression and T-wave inversion in leads II and arteriovenous, which is new, as well as I, aVL, V5 and V6, which is not new.   LAB WORK:   Hemoglobin 12.9, hematocrit 38.3, WBC 8.3, platelets 224.  Sodium 141,, potassium 3.7, chloride 109,  CO2 23.1, BUN 20, creatinine  1.3, glucose 143.  CK-MB less than 3.0, troponin-I less than 0.05.   ASSESSMENT AND PLAN:  1. Chest pain/hypertensive urgency.  The blood pressure is still up      and has been up for several weeks per patient.  Question restenosis      of renal artery stent(s).  Admit, cycle cardiac enzymes, continue      beta blocker, ARB and statin.  Will add heparin and nitrate as well      as hydralazine for blood pressure control.  Plan to check a renal      artery duplex, which may have to be done as an outpatient as it      will not be available over the weekend.  If cardiac enzymes remain      negative and BP is improved, possibly discharge in the a.m. with      outpatient PV follow-up.  Notably, she had a normal catheterization      in July 2007.  2. Hypertension.  See #1.  3. Hyperlipidemia.  Check lipids and LFTS.  Continue statin.  4. Type 2 diabetes mellitus.  Continue home medications.  5. Hypothyroidism.  Continue Synthroid and check TSH.      Nicolasa Ducking, ANP      Anita Friends. Dietrich Pates, MD, [facility]  Electronically Signed    CB/MEDQ  D:  05/26/2006  T:  05/27/2006  Job:  [id number]

## 2010-10-08 NOTE — Op Note (Signed)
[name], [name] NO.:  [id number]   MEDICAL RECORD NO.:  [id number]          PATIENT TYPE:  OIB   LOCATION:  5703                         FACILITY:  [facility]   PHYSICIAN:  Salvadore Farber, M.D. LHCDATE OF BIRTH:  07/12/1945   DATE OF PROCEDURE:  08/03/2005  DATE OF DISCHARGE:  08/04/2005                                 OPERATIVE REPORT   PROCEDURE:  Selective left renal angiography, drug-eluting stent placement  in the left renal artery for in-stent restenosis, arch aortography,  selective left subclavian angiography, stenting of the left subclavian  artery.   INDICATION:  [name] is a 65 year old woman who works as an Midwife  at [employer]  This job requires her to raise her arms over her head. With  this activity, she frequently becomes severely lightheaded. This has led her  to take a leave of absence from work. In addition, she is status post  bilateral renal artery stenting. She had severe in-stent restenosis on the  right which I treated with the cutting balloon angioplasty a month ago. At  that time, it looked as if the left renal was only moderately restenosed.  However, subsequent duplex ultrasound suggested severe in-stent restenosis  on the left. She presents today for repeat angiography and possible pressure  wire interrogation of the left renal artery and revascularization of the  left subclavian artery.   PROCEDURE TECHNIQUE:  Informed consent was obtained. Under 1% lidocaine  local anesthesia, a 6-French sheath was placed in the right common femoral  artery using modified Seldinger technique. Heparin was administered to  achieve and maintain an ACT of greater than 250 seconds. The patient had  been maintained on aspirin and Plavix prior to the procedure. A pigtail  catheter was advanced to the descending aorta. Arch aortography was  performed by power injection.   I then turned to the left renal artery. A 6-French LIMA guide was  advanced  over wire and engaged in the ostium of left renal artery. The catheter  damped very severely in the renal. Angiography confirmed critical stenosis  (greater than 80%). I advanced a support wire into the midportion of the  renal artery without difficulty. I then dilated the in-stent restenosis  using a 4.0 x 10 mm cutting balloon at 10 atmospheres for two inflations.  Repeat angiography demonstrated that the stent was approximately 10 mm in  from the ostium. The ostium had severe de novo stenosis. I then used a 3.5 x  23 mm Cypher stent to cover both the in-stent restenosis and extend it back  to the ostium of the left renal artery. I deployed this at 16 atmospheres. I  chose the drug-eluting stent because treating the ostium would require a  overlap with the previously placed stent. Given that the patient already had  severe in-stent restenosis of the stent, the likelihood of recurrent  restenosis was greater than 50%. Treatment of this with a subsequent drug-  eluting stent would have resulted in three layers of metal. I felt that the  risk of subsequent thrombosis with this would be unacceptably high. I  therefore felt she should be treated with drug-eluting stent for this first  restenosis extending it the ostium.   I then postdilated the stent using 5 x 15 mm Quantum at 12 atmospheres of  the distal margin and 16 atmospheres through the remainder of the stent  beginning approximately 1 mm from the distal most portion of the stent.  Repeat angiography demonstrated no residual stenosis, no dissection, and  normal flow to the renal parenchyma.   Attention was then turned to the severe left subclavian stenosis. A JR-4  catheter was used to selectively engage the left subclavian. Selective  angiography was performed by hand injection. A Wholey wire was then advanced  across the stenosis into the axillary artery. I exchanged over this wire for  a 6-French Cook shuttle sheath. I  advanced the dilator across the stenosis  and then exchanged for a Rosen wire which I positioned in the axillary  artery. With this in place, I proceeded to dilation using a 5 x 40 mm  Powerflex at 6 atmospheres. The lesion was clearly greater than 40 mm. I  therefore stented using a 7 x 29 mm Genesis stent deployed at 10  atmospheres, positioning it approximately 5 mm proximal to the origin of the  vertebral artery. I then placed an additional stent proximally to cover the  ostium of the subclavian and overlap with the previously placed stent by  several millimeters. I used a 7 x 18 mm Genesis and deployed at 10  atmospheres. I then used the stent balloon to post dilate the region of  overlap at 14 atmospheres. I then further dilated the ostial portion of the  stent using a 9 x 20 mm Powerflex at 10 atmospheres. The patient had no  discomfort with any of these inflations. Repeat angiography demonstrated no  residual stenosis, no dissection, and antegrade flow in both the vertebral  and the LIMA.   The arteriotomy was then closed using a Angio-Seal device. Complete  hemostasis was obtained. She was then transferred to holding room in stable  condition having tolerated the procedure well.   COMPLICATIONS:  None.   FINDINGS:  1.  Aortic arch: Normal origins of the great vessels. There is a type 1      aortic arch. There is mild calcification at the origin of the left      subclavian. There are normal origins of the great vessels. The      innominate artery is normal. The proximal portion of the left common      carotid is normal. There is antegrade flow via a large right vertebral.      There is retrograde flow via a large left vertebral artery.  2.  Left subclavian:  Long segment greater than 90% stenosis stented to no      residual stenosis with establishment of antegrade flow in the vertebral.  3.  Left renal artery: 80% in-stent restenosis treated with drug-eluting     stent to no  residual stenosis.   IMPRESSIONS/RECOMMENDATION:  Successful percutaneous revascularization of  both the left subclavian and left renal arteries. Due to the drug-eluting  stent in the renal artery, the patient should be maintained on both aspirin  and Plavix indefinitely.      Salvadore Farber, M.D. [facility]  Electronically Signed     WED/MEDQ  D:  08/03/2005  T:  08/04/2005  Job:  [id number]   cc:   Angus G. Renard Matter, MD  Fax: [phone]   [place] Bing,  M.D. [facility]  [address]  [address]  [place]  [place] [address]

## 2010-10-08 NOTE — Assessment & Plan Note (Signed)
[facility]                              CARDIOLOGY OFFICE NOTE   NAME:[name], [name]                     MRN:          [id number]  DATE:03/29/2006                            DOB:          [birth year]    HISTORY OF PRESENT ILLNESS:  [name] is a 65 year old lady with diffuse  atherosclerotic vascular disease. She is status post bilateral renal artery  stenting with subsequent restenosis which has been treated with drug-eluting  stents.  She is status post left subclavian stenting.  She has been troubled  for a long time by chest discomfort.  Cardiac catheterization in July  demonstrated nonobstructive coronary disease with preserved left ventricular  systolic function.  Interestingly, in the immediate post procedural time she  had severe chest discomfort accompanied by exacerbation of her baseline  apical ST depressions that was fairly impressive.  This resolved  spontaneously and she was discharged home.  I wondered if she had not had  coronary spasm.  However, there were no ST elevations.  We considered  discontinuing her beta blocker with the idea of coronary spasm.  She states  that she tolerates this medicine and has previously not tolerated a number  of medications for her blood pressure, specifically Labetalol.  She strongly  wishes to stay on it.   The whole situation has been complicated with difficulties with her job.  She has sometimes requested disability but now tells me she would be unhappy  if she were not working and has returned to her job.  Nonetheless, she has a  disability application in.  I have told her that I am not sure that I  understand this and she acknowledges the inconsistencies.  In addition,  things have been difficult between her and her husband.  They are living  together at present and she tells me things are better.   She is having one or two episodes of chest discomfort per week.  These occur  at rest and  not with exertion. She does not think they are any worse since  she has resumed her job.   CURRENT MEDICATIONS:  1. Lantus insulin.  2. Humalog sliding scale insulin.  3. Synthroid 44 mcg Monday and Friday and 88 every other day.  4. Vytorin 10/20, one per day.  5. Diovan/hydrochlorothiazide 320/25 one per day.  6. Flax seed oil.  7. Hydrocortisone.  8. Aspirin 81 mg per day.  9. Multivitamin.  10.Propranolol 80 mg twice per day.   PHYSICAL EXAMINATION:  GENERAL APPEARANCE:  She is generally well-appearing  in no distress.  VITAL SIGNS:  Heart rate 60, blood pressure 136/78, weight 149 pounds.  NECK:  She has no jugular venous distention.  No thyromegaly.  LUNGS:  Clear to auscultation.  CARDIAC:  She has a nondisplaced point of maximal cardiac impulse.  LV end  pulse is sustained.  There is a regular rate and rhythm with normal S1 and  S2.  There is an S4 but no S3.  ABDOMEN:  Soft, nondistended, nontender.  There is no hepatosplenomegaly and  no abdominal bruit. Bowel sounds  are normal.  EXTREMITIES:  Warm without cyanosis, clubbing, edema or ulceration.   IMPRESSION/RECOMMENDATIONS:  1. Chest pain:  Worst episodes have occurred with emotional upset.  With      her very labile hypertension, I wonder if she is having BP spikes that      are driving her chest pain.  Symptoms are as well controlled now as      they have been in a long time.  We will therefore make no changes.  2. Work:  I encouraged her in her efforts to return back to work.  3. Hypertension:  Control is better. Will make no changes in medications      today.  4. Renal artery stenosis:  Right renal artery stent widely patent.  Duplex      suggested possible restenosis on the left in August.  However, this was      seen just a few weeks after invasive angiogram had demonstrated it to      be widely patent.  I think the Duplex is likely erroneous.  With her      blood pressure better controlled, will observe her.   Will check BMET in      one month's time.     Salvadore Farber, MD  Electronically Signed    WED/MedQ  DD: 03/29/2006  DT: 03/30/2006  Job #: [id number]   cc:   Angus G. Renard Matter, MD

## 2010-10-08 NOTE — H&P (Signed)
NAMECARLEENA, [name] NO.:  [id number]   MEDICAL RECORD NO.:  [id number]          PATIENT TYPE:  INP   LOCATION:  2855                         FACILITY:  [facility]   PHYSICIAN:  [name], M.D.     DATE OF BIRTH:  1945-08-22   DATE OF ADMISSION:  06/29/2005  DATE OF DISCHARGE:                                HISTORY & PHYSICAL   PRIMARY CARDIOLOGIST:  Dr. Foreston Bing.   REASON FOR ADMISSION:  [name] is a 65 year old female, with history of  non obstructive coronary artery disease by previous cardiac catheterization  in August, 2003, who is now transferred directly from [facility] ER to the  cardiac catheterization lab for evaluation and management of possible acute  myocardial infarction. The patient reports recent development of exertional  chest discomfort relieved with rest, but denies any rest pain. She was  recently seen by her primary care physician for evaluation of some throat  discomfort and for hypotension. Blood work was drawn and, when results  returned as positive, patient was called back to the office today in follow  up. She had a normal CPK of 81 but abnormal MB fraction 8.3 and a troponin I  of 0.21. Given these findings, patient was referred directly to the  emergency room today.   An electrocardiogram in the emergency room revealed new J-point elevation in  the anteroseptal leads with development of new T wave inversion in lateral  leads, as compared to an EKG in 2003. Initial cardiac markers at [facility],  however, reveal normal troponin's.   The patient has been treated with aspirin and was placed on IV heparin. En  route, however, she reported recurrent chest pain and recommendation was to  take her directly to the catheterization lab. She is currently pain free but  continues to have markedly elevated systolic blood pressure in the upper  200's. She presented to [facility] with a systolic blood pressure of 252.   ALLERGIES:   CODEINE, CIPROFLOXACIN, PENICILLIN, NORVASC, NOVOCAIN, GLYNASE,  AND GLUCOPHAGE.   HOME MEDICATIONS:  1.  Levothyroxine 0.088/0.044 mg alternating daily.  2.  Aspirin 81 daily.  3.  Lantus 40 units q.h.s.  4.  Humalog sliding scale Insulin as directed.  5.  Vytorin 10/40 daily.  6.  Propranolol 80 b.i.d.  7.  Diovan 320 daily.  8.  Lasix 20 daily.  9.  Aldactone 50 daily.  10. Clonidine patch #3 weekly.  11. Albuterol MDI p.r.n.   PAST MEDICAL HISTORY:  1.  Non obstructive coronary artery disease.      1.  Forty % proximal left anterior descending artery stenosis by cardiac          catheterization August, 2003.      2.  Normal left ventricular function.  2.  Renal vascular disease.      1.  Status post bilateral renal artery stenting, September, 2006 [facility]).  3.  History of right carotid stenosis.  4.  Hypothyroidism.  5.  Insulin-requiring diabetes mellitus.  6.  Hypertension.  7.  Hyperlipidemia.  8.  History of nephrolithiasis.  9.  Status post lumbar diskectomy, '80 and '84.  10. Ongoing tobacco.  11. History of anxiety.  12. History of lower extremity edema.   SOCIAL HISTORY:  The patient lives in [place] with her husband. She has  three children. She works in Designer, fashion/clothing at [employer]. She reports an approximate  20 pack year history of smoking tobacco. Denies alcohol use.   FAMILY HISTORY:  Father died in his 72's, secondary to myocardial  infarction.   REVIEW OF SYSTEMS:  As noted per HPI. Has also experienced recent exertional  dyspnea, but no orthopnea or paroxysmal nocturnal dyspnea. Has chronic lower  extremity edema which is currently under control. Denies any recent evidence  of upper or lower GI bleeding. Remaining physical is negative.   PHYSICAL EXAMINATION:  VITAL SIGNS:  Blood pressure 252/67 initially at  [facility] ER. Temperature 97.1, pulse 60's, respirations 20.  GENERAL:  A 65 year old female, lying supine in no apparent distress.  HEENT:   Normocephalic, atraumatic.  NECK:  Preserved carotid pulses without bruits.  LUNGS:  Diminished breath sounds but without crackles or wheezes.  HEART:  Regular rate and rhythm, S1, S2. No significant murmurs.  ABDOMEN:  Protuberant, nontender with intact bowel sounds.  EXTREMITIES:  Palpable distal pulses, there is no significant edema.  NEURO:  No focal deficits.   LABORATORY DATA:  Hemoglobin is 16.7, hematocrit 49.6. WBC 8900, platelets  232,000. Sodium 136, potassium 5, BUN 36, creatinine 1.7. Of note, patient  had BUN 37/creatinine 1.6 on June 24, 2005 and BUN 19/creatinine 1.1 on  June 08, 2005. Additionally, she had a BUN of 10/creatinine 0.8 in  October of 2006. Initial cardiac enzymes (POC) at [facility]: MB 5.5, 3.3  and troponin I of less than 0.05 x2.   IMPRESSION:  1.  Acute coronary syndrome.  2.  Non obstructive coronary artery disease.      1.  Cardiac catheterization in August, 2003.  3.  History of normal left ventricular function.  4.  Renal insufficiency.      1.  Status post bilateral renal artery stenting, September, 2006.  5.  Uncontrolled hypertension.  6.  Insulin requiring diabetes mellitus.  7.  Hyperlipidemia.   PLAN:  Given the patient's current elevated creatinine which has slowly  progressed to data from 1 month ago, and which is abnormal compared to data  of October, 2006, recommendation is to defer cardiac catheterization at this  time pending improvement in the renal function. At this point, the patient  does not present with ST elevation and is currently asymptomatic. Her  current cardiac markers are normal, as compared to a recent office troponin  level of 0.21 from 2 days ago. At this point, we will therefore try to  better manage her uncontrolled hypertension by starting her on IV labetalol.  We will also proceed with gentle hydration with normal saline at 75 cc/Hr. We will check serial BMET for close monitoring of renal function.   Additionally, we will stop Diovan and place both Lasix and aldactone on  hold. The patient will otherwise continue on her home medication regimen. We  will cycle cardiac markers and repeat an EKG in the morning. We will also  continue intravenous nitroglycerin as well as IV heparin and keep patient  n.p.o. after midnight except medications.   Dr. Samule Ohm will be consulted regarding further evaluation of her bilateral  renal artery stents regarding new recommendations about further diagnostic  work up prior to proceeding with coronary angiography.  At this point, patient will be taken directly from the cardiac  catheterization lab to a CCU bed for close monitoring and further  management.      [name], P.A. [facility]    ______________________________  [name], M.D.    GS/MEDQ  D:  06/29/2005  T:  06/29/2005  Job:  [id number]   cc:   Angus G. Renard Matter, MD  Fax: [phone]

## 2010-10-08 NOTE — Discharge Summary (Signed)
[name], OSSO NO.:  [id number]   MEDICAL RECORD NO.:  [id number]          PATIENT TYPE:  OBV   LOCATION:  6531                         FACILITY:  [facility]   PHYSICIAN:  Nanetta Batty, M.D.   DATE OF BIRTH:  1945-12-04   DATE OF ADMISSION:  09/04/2007  DATE OF DISCHARGE:  09/05/2007                               DISCHARGE SUMMARY   DISCHARGE DIAGNOSES:  1. Chest pain.      a.     Nonobstructive coronary disease with 50% LAD stenosis only.  2. Claudication of lower extremities.  3. Peripheral vascular disease with abnormal Dopplers of distal      abdominal aorta and bilateral iliac.      a.     50% right iliac stenosis and moderate atherosclerotic distal       abdominal aortic disease.      b.     Renal artery stenosis bilaterally with previous stents, now       placement of PTA and stent, bilateral renal arteries.  4. Nausea and vomiting after cardiac cath.  5. Hypertension.  6. Renal insufficiency.  7. Diabetes mellitus.   DISCHARGE CONDITION:  Improved.   PROCEDURES:  Coronary angiography by Dr. Nanetta Batty.   September 04, 2007, 2D angiogram including abdominal aorta, lower extremity  arteriogram, and renal arteriogram by Dr. Nanetta Batty.   September 04, 2007, PTA and stent deployment bilaterally to renal arteries,  proximal to previous placed stents.   DISCHARGE MEDICATIONS:  1. Aspirin 81 mg 2 tablets daily.  2. Mucomyst 600 mg take night of discharge, then stop.  3. Lantus 50 units subcu in the evening.  4. Provera inhalers 1 puff as needed.  5. Bystolic 10 mg every morning.  6. Tekturna 300 mg in the morning.  7. Alprazolam 1 mg in the morning.  8. Synthroid 100 mcg daily.  9. Diovan HCT 320/25, hold.  10.Catapres patch once weekly.  11.Lovaza twice a day.  12.TriCor 145 mg daily.  13.Lasix 40 mg daily, hold.  14.Taclonex on hands daily.  15.Humalog sliding scale as previously done at home.  16.Simvastatin 40 mg every evening.  17.Plavix 75 mg 1 daily, do not stop.  Please note to hold the Diovan HCT and Lasix, unless swelling begins or  elevated blood pressure, then call Dr. Hazle Coca office.   DISCHARGE INSTRUCTIONS:  1. Low-sodium, heart-healthy diabetic diet.  Wash cath site with soap      and water.  Call if any bleeding, swelling, or drainage.  2. Increase activity slowly.  May shower, bathe.  No lifting for 1      week.  No driving for 2 days.  3. Follow with Dr. Allyson Sabal in [place] on September 13, 2007, at 10:45      a.m.  4. You are scheduled for renal artery Doppler in the [place]      office.  The office will call you with date and time.  5. Have blood work done in the morning of September 07, 2007, do not eat      or drink before the test.  HISTORY OF PRESENT ILLNESS:  A 65 year old female patient of Dr. Hazle Coca  and Dr. Butch Penny', was followed by Dr. Allyson Sabal for hypertension with  renal artery stenosis and stents.  These were placed by radiologist, Dr.  Kearney Hard, at [facility] on January 31, 2005.  She also had a previous  cardiac cath in June 2007, with patent coronary arteries and normal LV  function.  She has also had a history of left subclavian artery PTA and  stenting by Dr. Samule Ohm in March 2007.  The patient had complained of  chest pain occurring on a daily basis.  Also, she had increased  velocities on renal artery Dopplers and also had complained of  claudication.  Dr. Allyson Sabal arranged for her to come in for elective  cardiac cath, as well as peripheral angiogram, and renal artery cath.  She has renal insufficiency.  She was treated with Mucomyst and sodium  bicarb.   FAMILY HISTORY, SOCIAL HISTORY, AND REVIEW OF SYSTEMS:  See H&P.   PHYSICAL EXAMINATION AT DISCHARGE:  Vital signs were stable, though she  had been hypertensive in the lab, blood pressure was 135/45 on day of  discharge.  Cath site was stable, and her nausea and vomiting from the  night before had resolved.  She was  seen and examined by Dr. Alanda Amass.   LABORATORY DATA:  Hemoglobin 13.2, hematocrit 39, WBC 7.6, and platelets  228.  On admission, sodium was 135, potassium 3.5, chloride 97, CO2 25,  glucose 308, BUN 59, creatinine 2.2, and at discharge, sodium 142,  potassium 3.7, chloride 102, CO2 34, glucose 151, BUN 39, and creatinine  1.83.   HOSPITAL COURSE:  The patient was admitted, underwent cardiac cath, as  well as 2D angiogram, and renal artery angiogram as stated, with PTA and  stents to the bilateral renal artery.  Postprocedure, she had  significant nausea and vomiting.  She was treated with Zofran and  Phenergan.  By the next morning, she felt well, had no  further complaints, and was ready for discharge to home.  Because of her  renal insufficiency, we held her Diovan HCT and her Lasix.  She will  follow up with Dr. Allyson Sabal and have followup labs done as well.  She was  seen and discharged by Dr. Alanda Amass.      Darcella Gasman. Annie Paras, N.P.      Nanetta Batty, M.D.  Electronically Signed    LRI/MEDQ  D:  09/28/2007  T:  09/29/2007  Job:  [id number]   cc:   Angus G. Renard Matter, MD  Dr. Wonda Olds

## 2010-10-08 NOTE — Op Note (Signed)
[name], [name]              ACCOUNT NO.:  [id number]   MEDICAL RECORD NO.:  [id number]          PATIENT TYPE:  AMB   LOCATION:  SDS                          FACILITY:  [facility]   PHYSICIAN:  Salvadore Farber, M.D. LHCDATE OF BIRTH:  1946-04-11   DATE OF PROCEDURE:  12/07/2005  DATE OF DISCHARGE:                                 OPERATIVE REPORT   PROCEDURE:  Drug-eluting stent placement in the right renal artery.   INDICATIONS:  [name] is a 65 year old lady status post placement of  Palmaz blue stent in the right renal artery by Dr. Kearney Hard approximately nine  months ago.  She now presents with severe in-stent restenosis.  She had  previously had similar in-stent restenosis on the left which failed cutting  balloon angioplasty and has subsequently been successfully treated with a  drug-eluting stent.  We, therefore, decided to proceed directly to the drug-  eluting stent placement to minimize the risk of recurrence.  I had been  clear with the patient that there is no data to support the off label use of  the drug-eluting stent in the renal artery.  However, anecdotal reports  suggested it is beneficial.  She understands and agrees to proceed.   PROCEDURE TECHNIQUE:  Informed consent was obtained.  Under 1% lidocaine  local anesthesia, a 6-French sheath was placed in the left common femoral  artery using the modified Seldinger technique.  Anticoagulation was  initiated with bivalirudin.  ACT was confirmed to be greater than 225  seconds.  A 6-French LIMA guide was advanced over the wire and engaged in  the ostium of the left renal artery.  A stabilizer wire was advanced into  the mid portion of the renal artery without difficulty.  The lesion was  predilated using a 5 x 20 mm Quantum at 12 atmospheres.  It was then stented  using a 3.5 x 23 mm Cypher deployed at 16 atmospheres.  The entirety of the  stent was then post dilated using the Quantum balloon at 16 atmospheres  distally and 20 atmospheres within the previously stented portion.  Final  angiography demonstrated no residual stenosis, no dissection, and normal  flow to the renal parenchyma.   The arteriotomy was then closed using a Starclose device.  1 gram of  vancomycin was administered.  Complete hemostasis was obtained. She was  transferred to the holding room in stable condition.   COMPLICATIONS:  None.   IMPRESSION/PLAN:  Successful placement of a drug-eluting stent in the right  renal artery for in-stent restenosis.  She will be followed with a renal  duplex study in 1-2 weeks.      Salvadore Farber, M.D. [facility]  Electronically Signed     WED/MEDQ  D:  12/07/2005  T:  12/07/2005  Job:  [id number]   cc:   Angus G. Renard Matter, MD

## 2010-10-08 NOTE — Discharge Summary (Signed)
NAME:  [name], [name]                         ACCOUNT NO.:  [id number]   MEDICAL RECORD NO.:  [id number]                   PATIENT TYPE:  INP   LOCATION:  A204                                 FACILITY:  [facility]   PHYSICIAN:  Angus G. Renard Matter, M.D.              DATE OF BIRTH:  1946/04/07   DATE OF ADMISSION:  05/23/2002  DATE OF DISCHARGE:  05/25/2002                                 DISCHARGE SUMMARY   DIAGNOSES:  1. Bronchopneumonia.  2. Pleural effusion.  3. Diabetes mellitus type 2.  4. Hypertension.   CONDITION:  Stable and improved at the time of discharge.   HISTORY OF PRESENT ILLNESS:  This 65 year old white female presented with a  three-week history of shortness of breath.  She had been treated for  bronchitis with Z-Pak and got better.  Three days prior to admission began  having similar symptoms with worsening shortness of breath.  She did not  complain of chest pain or any fever.  She did complain of orthopnea.  ED  physician felt that she had a left lower lobe infiltrate on x-ray and  pleural effusion.  The patient was admitted for further care.   PHYSICAL EXAMINATION:  GENERAL:  A well-developed, well-nourished female in  no acute distress.  VITAL SIGNS:  Blood pressure 206/90, respirations 20, pulse 79, temperature  98.4.  HEENT:  Eyes:  PERRLA.  TM's negative.  Oropharynx benign.  HEART:  Regular rhythm, no murmurs.  LUNGS:  Diminished breath sounds.  ABDOMEN:  No palpable organs or masses.  EXTREMITIES:  There is no edema.  NEUROLOGIC:  No focal deficits.  SKIN:  Moist with no rash.   LABORATORY DATA:  Admission CBC:  WBC 9900, hemoglobin 12.5, hematocrit  35.7.  ABG's on admission:  A pH of 7.350 with  PCO2 of 35, PO2 of 80.  Chemistries:  Sodium 136, potassium 3.7, chloride 108, CO2 of 32, glucose  136, BUN 15, creatinine 0.9, calcium 8.6.  Liver enzymes:  SGOT 15, SGPT 15,  alkaline phosphatase 58, total bilirubin 0.6.  CPK on admission 61, CPK-MB  2.4.   BNP 322.  Subsequent CPK 55, CPK-MB 2.1, troponin 0.04.  Urinalysis  negative.   Chest x-ray:  Changes suggestive of bronchitis or interstitial pneumonia.  CT of the chest:  No CT evidence of pulmonary emboli, moderate-sized  bilateral pleural effusion.  No evidence of pulmonary edema.  Subsequent  chest x-ray marked interval decrease in left lower lobe opacity.  Electrocardiogram:  Normal sinus rhythm.   HOSPITAL COURSE:  The patient at the time of her admission was placed on  nasal O2 at 2 L, albuterol and Atrovent nebulizer treatments, IV fluids,  normal saline.  Cardiac enzymes were monitored.  She was placed on heparin  5000 units subcutaneously b.i.d., Levaquin 500 mg daily.  She was continued  on Synthroid 88 mcg daily, aspirin 81 mg daily, Zocor 20 mg  daily, Inderal  120 mg b.i.d., insulin 70/30 q.a.m. and 15 units each p.m.  The patient  subsequently was placed on Lovenox 1 mg/kg subcutaneously.  She improved  throughout hospital stay.  Subsequent CT of chest showed improvement but  some pleural effusion in lower lung field remained.  The patient improved  with treatment and was able to be discharged after two days hospitalization  and followed as an outpatient.   DISCHARGE MEDICATIONS:  1. Ativan 0.5 mg every four hours p.r.n.  2. Inderal 40 mg daily.  3. Levaquin 500 mg daily.  4. Ambien 5 mg h.s.  5. Instructed to continue home medicines.                                               Angus G. Renard Matter, M.D.    AGM/MEDQ  D:  06/27/2002  T:  06/27/2002  Job:  [id number]

## 2010-10-08 NOTE — Procedures (Signed)
NAMEALIANIS, TRIMMER                         ACCOUNT NO.:  [id number]   MEDICAL RECORD NO.:  [id number]                   PATIENT TYPE:  OUT   LOCATION:  RAD                                  FACILITY:  [facility]   PHYSICIAN:  Thomas C. Wall, M.D.                DATE OF BIRTH:  [birth year]   DATE OF PROCEDURE:  04/18/2003  DATE OF DISCHARGE:                                    STRESS TEST   INDICATION:  Ms. [name] is a 65 year old female with nonobstructive  coronary artery disease by cardiac catheterization in August of 2003  revealing a left anterior descending with 40% proximal stenosis.  She now  presents with exertional chest discomfort relieved at rest.  Her cardiac  risk factors include tobacco abuse, hypertension, hyperlipidemia and  diabetes mellitus.   BASELINE DATA:  EKG reveals sinus rhythm at 62 beats per minute with  nonspecific ST abnormalities, unchanged from previous EKG done in the office  on April 11, 2003.  She does have left ventricular hypertrophy with  repolarization abnormalities that are new since 1998.   Baseline blood pressure was 188/110.  The patient was originally scheduled  for an exercise Cardiolite, however, this was changed to an adenosine  Cardiolite, considering her hypertension.   Adenosine 38 mg was infused over a 4-minute protocol, with Cardiolite  injected at 3 minutes.  The patient reported chest and throat tightness with  headache which resolved in recovery.  EKG revealed no arrhythmias, however,  there was ST depression and T wave inversion inferolaterally which resolved  in recovery.  Maximum heart rate was 105 beats per minute and maximum blood  pressure was at rest at 188/110; after adenosine infusion, blood pressure  came down to 148/90.   Final images and results are pending M.D. review.     ________________________________________  ___________________________________________  Jae Dire, P.A. [facility]                      Thomas C.  Wall, M.D.   AB/MEDQ  D:  04/18/2003  T:  04/18/2003  Job:  [id number]

## 2010-10-08 NOTE — Discharge Summary (Signed)
NAMEALANDRIA, BUTKIEWICZ NO.:  [id number]   MEDICAL RECORD NO.:  [id number]          PATIENT TYPE:  INP   LOCATION:  3730                         FACILITY:  [facility]   PHYSICIAN:  Jonelle Sidle, MD DATE OF BIRTH:  [birth year]   DATE OF ADMISSION:  05/26/2006  DATE OF DISCHARGE:  05/27/2006                               DISCHARGE SUMMARY   PRIMARY CARDIOLOGIST:  Dr. Randa Evens.   PRIMARY CARE PHYSICIAN:  Angus G. McInnis, MD.   PROCEDURES PERFORMED DURING HOSPITALIZATION:  None.   PRIMARY DIAGNOSIS:  Hypertensive urgency with labile hypertension.   SECONDARY DIAGNOSES:  1. Nonobstructive coronary artery disease with cardiac catheterization      in July 2007 with an EF of 70%.  2. Bilateral renal artery stenosis.  A.  Status post bilateral drug-      eluting stent placement to the renal artery, September of 2006.  B.      June 30, 2005, cutting balloon angioplasty to the right renal      artery secondary to significant restenosis.  C.  August 03, 2005, in-      stent restenosis in the left renal artery treated with cutting      balloon followed by 3.5 x 23 mm Cypher drug-eluting stent.  D.      December 07, 2005, re-stenosis of the right renal artery with      subsequent 3.5 x 23 mm Cypher drug-eluting stent placement.  3. Peripheral vascular disease.  A.  August 03, 2005, PTA in-stenting      of the left subclavian artery with placement of a 7 x 29 mm Genesis      stent as well as a 7 x 18 mm Genesis bare-metal stent.  4. Hypertension.  5. Hyperlipidemia.  6. Type 2 diabetes.  7. Hypothyroidism.  8. Anxiety and panic disorder.  9. Chronic back pain.  10.Ongoing tobacco abuse, smoking one pack a day with previous 20-pack-      year history.  11.Status post lumbar diskectomy in 1980 and 1984.  12.History of nephrolithiasis.  13.History of osteoarthritis.   HISTORY OF PRESENT ILLNESS:  [age]-year-old Caucasian female with  multiple medical problems  as stated above who presented to the emergency  room with complaint of chest discomfort and complaints of elevated blood  pressure.  She states that she had been noticing her blood pressure  creeping up and over the last several days and while talking on the  phone with her daughter she developed sudden onset of 5/10 substernal  chest pain with shortness of breath.  The patient checked her blood  pressure at home.  It was found to be 245/120 and she called EMS.   HOSPITAL COURSE:  On arrival to the hospital, the patient was found to  continue to have hypertension with a blood pressure of 191/87 and her  chest pain had gone down to a 2/10.  She continued to have complaints of  chest pain while in the emergency room.  The patient did not have any  labored breathing.  Her EKG showed inferolateral T-wave inversion in  comparison to old EKGs.  There was not an inferior T-wave inversion at  that time.  Cardiac enzymes were completed during hospitalization.  Her  initial cardiac enzyme was 0.03.  Subsequent cardiac enzymes revealed  0.03 and 0.03, respectively.  In lieu of cardiac enzyme results, the  patient was started on heparin until results were defined.  The patient  did have heparin infusion monitored through pharmacy.  The patient had  no further complaints of chest discomfort after being admitted and  started on Imdur 30 mg p.o. daily.  The patient's blood pressure did  normalize, dropping from 182/71 to 121/85 during hospitalization.  It  was also noted that the patient was mildly hypokalemic with potassium of  3.1.  Her BUN was 21 and creatinine was 1.1.  The patient had potassium  repleted during hospitalization.   On day of discharge, the patient was seen and examined by Dr. Simona Huh.  The patient's blood pressure had normalized.  There were no  further complaints of chest discomfort.  The patient had already been  scheduled for renal  ultrasound; however, this was not an  arterial  study.  The patient will be scheduled for renal artery ultrasound as an  outpatient with followup by Dr. Samule Ohm.  The patient continued on Imdur  during hospitalization.  She did have a mild amount of stomach upset  after taking it.   LABS ON DISCHARGE:  Sodium 138, potassium 3.1 (this has been repleted),  chloride 107, CO2 25, glucose 237, BUN 21, creatinine 1.3, PTT 71, INR  1, PT 13.4, hemoglobin 11.9, hematocrit 34.2, white blood cells 8.7,  platelets were 194, LFTs were within normal limits, troponin was 0.03,  0.03 and 0.03, respectively, CK-MB 4, 3.4 and 3.5, respectively.  Patient's cholesterol 124, triglycerides 506, HDL 29, LDL was not  calculated secondary to elevated triglycerides.  Thyroid studies:  TSH  was found to be 4.931.  Blood pressure on discharge:  121/85, heart rate  74, respirations 20, temperature 97.1.   FOLLOWUP APPOINTMENTS:  1. The patient will be followed up by Dr. Samule Ohm in 2-3 weeks.  Office      is to call as this is a weekend.  2. The patient is scheduled for an outpatient renal artery ultrasound.      Of note, the patient has already been scheduled for one on February      28 at 10 a.m. along with bilateral lower extremity Doppler studies.      However, Dr. Diona Browner felt that the patient should have a renal      artery ultrasound completed sooner secondary to the hypertension      and this has been noted with message left for the office to see if      this can be scheduled sooner prior to being seen by Dr. Samule Ohm on      the followup appointment.  3. The patient is to follow up with her primary care physician, Dr.      Renard Matter, in 2-3 weeks.  4. Secondary to complaints of stomach upset with use of Imdur, if      patient continues to have stomach upset, I have asked the patient      to stop taking her medication for 24 hours to see if the stomach      upset has gone away.  If it has, she is to notify Dr. Melinda Crutch     office that she was  prescribed Imdur and it  was causing stomach      upset so that he is aware and other alternative medication can be      given.   DISCHARGE MEDICATIONS:  1. Imdur 30 mg once a day.  2. Alprazolam 0.5 mg q.4 h. as needed.  3. Aspirin 81 mg once a day.  4. Diovan/hydrochlorothiazide 320 per 25 mg once a day.  5. Humalog insulin per sliding scale.  6. Lantus insulin 30 units at bedtime.  7. Propanolol 80 mg twice a day.  8. Synthroid 88 mcg once a day.  9. Vytoren 10 per 20 mg once a day.   ALLERGIES:  GLUCOPHAGE, GLYNASE, CODEINE, LABETALOL, LIDOCAINE,  NOVOCAINE, MORPHINE, CIPRO, PENICILLIN AND NORVASC.   TIME SPENT WITH PATIENT INCLUDING PHYSICIAN TIME:  40 minutes.      Bettey Mare. Lyman Bishop, NP      Jonelle Sidle, MD  Electronically Signed    KML/MEDQ  D:  05/27/2006  T:  05/27/2006  Job:  [id number]   cc:   Angus G. Renard Matter, MD

## 2010-10-08 NOTE — H&P (Signed)
[name], [name]                         ACCOUNT NO.:  [id number]   MEDICAL RECORD NO.:  [id number]                   PATIENT TYPE:  INP   LOCATION:  A204                                 FACILITY:  [facility]   PHYSICIAN:  Thomas C. Wall, M.D. [facility]            DATE OF BIRTH:  [birth year]   DATE OF ADMISSION:  01/11/2002  DATE OF DISCHARGE:                                HISTORY & PHYSICAL   CHIEF COMPLAINT:  Exertional chest tightness and shortness of breath.   HISTORY OF PRESENT ILLNESS:  The patient is a 65 year old married white  female who was admitted on January 11, 2002, with exertional chest pain.  Cardiac enzymes have been negative but she has EKG changes.   She has multiple cardiac risk factors including tobacco use of a pack per  day, hypertension, hyperlipidemia of the mixed variety, family history and  also known carotid artery disease.   She has noted that she has had chest tightness and nausea while pushing  buggies at work.  The pain is substernal.  It does not radiate.  It is  associated with shortness of breath, diaphoresis and nausea.   PAST MEDICAL HISTORY:  She has had insulin dependent diabetes and had  diabetes during pregnancy years ago.  She has hypertension and mixed  hyperlipidemia.  She has history of hypothyroidism, anxiety, right carotid  disease with no surgery, history of lumbar surgery in 1980 and 1984, history  of lithotripsy for nephrolithiasis in 1999.   ALLERGIES:  She is intolerance of multiple medications including LIDOCAINE,  PENICILLIN, CIPRO, CODEINE, GLUCOPHAGE, GLYNASE, and NORVASC.   MEDICATIONS PRIOR TO ADMISSION:  1. Inderal LA 120 q.d.  2. Ativan 0.5 q.6h. p.r.n.  3. Multivitamin q.d.  4. Aspirin 325 every other day.  5. Synthroid 88 mcg a day.  6. Zocor 40 mg a day.  7. Chlorthalidone 25 mg a day.  8. Humulin insulin.   SOCIAL HISTORY:  She is married and lives with her husband in [place].  She  has no use of alcohol.  She has  three children and six grandchildren.   FAMILY HISTORY:  Her mother has angina at age 40.  Her father died of an MI  in his 70s.  She has one sister who has hypertension.   REVIEW OF SYSTEMS:  She has some occasional dizziness and blurred vision.  She does not have classic TIA symptoms, however.  She has had no history of  stroke or seizure.  She seems to be heat intolerant.   PHYSICAL EXAMINATION:  GENERAL APPEARANCE:  She is very pleasant lady in no  acute distress.  She is in total denial and cannot believe this is happening  to her.  VITAL SIGNS:  Blood pressure is 163/67, pulse 55 and regular, temperature  97.7.  Respiratory rate is 20 and unlabored.  O2 saturations are adequate.  HEENT:  Extraocular movements are intact.  Sclerae are clear.  Pupils are  equal, round, reactive to light and accommodation.  Facial symmetry is  normal.  NECK:  There are bilateral carotid bruits but no JVD.  She has no  thyromegaly.  Trachea is midline.  LUNGS:  Clear to auscultation and percussion.  CARDIOVASCULAR:  Regular rate and rhythm without murmur or gallop.  SKIN:  Warm and dry.  ABDOMEN:  Soft with good bowel sounds.  EXTREMITIES:  She has bilateral femoral bruits but good pulses distally.  There is no edema.  NEUROLOGIC:  Grossly intact.   Chest x-ray shows no acute cardiopulmonary disease.   EKG shows question of an old anterior MI, Q-wave changes in I, II, aVL and  V4 through V6.   LABORATORY DATA:  TSH is 7.7.  Cardiac enzymes negative x3.  Cholesterol  154, triglycerides 295, HDL 42 and LDL 54.  Creatinine 0.8, fasting 3.4,  glucose 232.  INR is normal 1.0.  Hemoglobin 14.3 and platelets 199,000.  PTT is normal.   ASSESSMENT:  1. Exertional angina.  2. Multiple cardiac risk factors.  3. Cerebrovascular disease.  4. Peripheral vascular disease.  5. Type 2 insulin dependent diabetes.  6. Hyperlipidemia of mixed variety.  7. Family history of coronary disease.  8. Obesity.   9. Hypothyroidism with elevated TSH at present.  10.      Anxiety and depression.  11.      History of lumbar surgery.  12.      History of renal calculi.   PLAN:  1. Transfer to [facility] [facility] [employer] [facility] for diagnostic     catheterization.  Indication of risks and potential benefits have been     discussed with the patient.  She agrees to proceed but is concerned     whether this is really necessary.  I spent a long time with her husband     and her, explaining the need for defining coronary anatomy and deciding     on what form of treatment.  This includes percutaneous intervention     surgery or medical treatment.  2. Carotid Dopplers.  3. Supplement potassium.  4. Quit smoking and modify other risk factors aggressively.   The patient may benefit more from Crestor or rosuvastatin  than Zocor. It  will probably come closer to normalizing her triglycerides and also  increasing her HDL as much as 12 to 20%.                                               Thomas C. Daleen Squibb, M.D. [facility]    TCW/MEDQ  D:  01/14/2002  T:  01/15/2002  Job:  [id number]   cc:   Angus G. Renard Matter, M.D.   Gerrit Friends. Dietrich Pates, M.D. [facility]  [address]  [place]  [place] [address]  Fax: 1

## 2010-10-08 NOTE — H&P (Signed)
NAMEJULETTA, [name]                         ACCOUNT NO.:  [id number]   MEDICAL RECORD NO.:  [id number]                   PATIENT TYPE:  INP   LOCATION:  A204                                 FACILITY:  [facility]   PHYSICIAN:  Gracelyn Nurse, M.D.              DATE OF BIRTH:  [birth year]   DATE OF ADMISSION:  05/23/2002  DATE OF DISCHARGE:                                HISTORY & PHYSICAL   CHIEF COMPLAINT:  Shortness of breath.   HISTORY OF PRESENT ILLNESS:  This is a 65 year old white female who presents  with a three-week history of shortness of breath.  She was treated for  bronchitis with a Z pack and got better.  Three days ago she began having  similar symptoms again with worsening shortness of breath.  Again she was  given a Z pack.  This time got no better and continued to get worse.  She  does not complain of any chest pain or any fever and chills.  She does  complain of orthopnea.   PAST MEDICAL HISTORY:  1. Insulin-dependent diabetes.  2. Nonobstructive coronary artery disease:  Status post catheterization in     August 2003.  3. Hypertension.  4. Right carotid stenosis.  5. Hypothyroidism.   ALLERGIES:  CODEINE, PENICILLIN, and NORVASC.   CURRENT MEDICATIONS:  1. Synthroid 88 mcg q.d.  2. Insulin 70/30 10 units q.a.m.  3. Insulin NPH 15 units q.p.m.  4. Aspirin 81 mg q.d.  5. Zocor 20 mg q.h.s.  6. Inderal 120 mg q.d.   SOCIAL HISTORY:  Tobacco - smokes one pack of cigarettes per day.  Does not  drink any alcohol.  Married with three children.   FAMILY HISTORY:  Mother is age 40 and has hypertension.  Father died in his  68s of an MI.   REVIEW OF SYSTEMS:  As per HPI; all other systems reviewed and are normal.   VITAL SIGNS:  Temperature 98.4, pulse 79, respirations 20, blood pressure  206/90.   PHYSICAL EXAMINATION:  GENERAL:  This is a well-nourished white female in no  acute distress.  HEENT:  Pupils equal, round, and reactive to light, extraocular  movement  intact.  Oral mucosa is moist, oropharynx is clear.  CARDIOVASCULAR:  Regular rate and rhythm, no murmurs.  LUNGS:  Decreased breath sounds in the left base.  ABDOMEN:  Soft, nontender, nondistended.  Bowel sounds are positive.  EXTREMITIES:  No edema.  NEUROLOGIC:  Cranial nerves II-XII grossly intact, no focal deficits.  SKIN:  Moist with no rash.   LABORATORY DATA:  Chest x-ray shows left lower lobe infiltrate with  effusion.   Admitting labs:  A pH is 7.47, PCO2 33, PO2 65, bicarb 23 on room air.  Sodium 136, potassium 3.7, chloride 108, CO2 32, BUN 15, creatinine 0.9,  glucose 136.  White blood cells 9.9, hemoglobin 12.5, platelets 188.  BNP  322.  D-dimer 0.85.  CK 61 with MB fraction 2.4.   ASSESSMENT AND PLAN:  1. Pneumonia.  Looks like she has a left lower infiltrate on chest x-ray.     We will go ahead and start her on IV antibiotics; we will use Levaquin.     We will continue nebulizers and O2 support.  2. Pleural effusion.  This is likely secondary to #1; however, this could     represent asymmetric congestive heart failure or could be caused by a     pulmonary embolus.  Her BNP is mildly elevated, although not really     impressive.  She does have an elevated D-dimer so we are obligated to get     a chest CT to rule out PE.  3. Insulin-dependent diabetes.  I will continue her current medications plus     sliding scale insulin.  4. Hypertension.  Will continue her current medications also.                                               Gracelyn Nurse, M.D.    JDJ/MEDQ  D:  05/23/2002  T:  05/23/2002  Job:  [id number]

## 2010-10-08 NOTE — Discharge Summary (Signed)
[name], [name]              ACCOUNT NO.:  [id number]   MEDICAL RECORD NO.:  [id number]          PATIENT TYPE:  INP   LOCATION:  A222                          FACILITY:  [facility]   PHYSICIAN:  Angus G. Renard Matter, MD   DATE OF BIRTH:  [birth year]   DATE OF ADMISSION:  04/27/2007  DATE OF DISCHARGE:  12/08/2008LH                               DISCHARGE SUMMARY   A 66 year old female admitted April 27, 2007, discharged April 30, 2007, three days' hospitalization.   DIAGNOSES:  1. Congestive heart failure.  2. Stage III chronic kidney disease.  3. Diabetes mellitus type 2 insulin-dependent.  4. Hypothyroidism.  5. Hyperlipidemia.  6. Hypokalemia.  7. Coronary artery disease.  8. History of malignant hypertension.   CONDITION:  Stable and improved at time of discharge.   This 65 year old married Caucasian female was seen in the office on the  day of admission with continued dyspnea with exertion.  This patient has  a longstanding history of hypertension and coronary artery disease.  She  has been in congestive heart failure previously.  She has a history of  bilateral renal artery stenosis, type 2 diabetes and hypothyroidism.  She was noted to have abnormal laboratory tests prior to admission with  a sodium of 148, potassium 4.2, chloride 106, CO2 225, BUN 36,  creatinine 1.89.  BNP 886.  Chest x-ray showed no acute findings.  Heart  was enlarged but stable.  The patient was given, prior to admission,  Lasix.  This has failed to cause much of a diuresis.  She continued to  have dyspnea and it was felt she should be admitted for more aggressive  diuresis and further get investigation into other causes of her dyspnea.   PHYSICAL EXAMINATION:  GENERAL APPEARANCE:  Alert, uncomfortable white  female.  VITAL SIGNS:  Blood pressure 130/80, pulse 72, respiration 18,  temperature 98.  HEENT:  Eyes: PERRLA.  TMs negative.  Oropharynx benign.  NECK:  Supple.  No JVD or thyroid  abnormalities.  HEART:  Regular rhythm with no murmurs.  LUNGS:  Clear to P&A.  ABDOMEN:  No palpable organs or masses.  No organomegaly.  EXTREMITIES:  Free of edema.   LABORATORY DATA:  Admission CBC:  WBC 8600, with hemoglobin 13.3,  hematocrit 39.7.  Blood gases: pH of 7.350, with pCO2 35, pO2 80.  Chemistries on admission:  Sodium 135, potassium 3.2, chloride 99, CO2  24, glucose 300, BUN 50, creatinine 2.43 and on April 29, 2007,  sodium 139, potassium 3.7, chloride 101, CO2 27, glucose 98, BUN 63,  creatinine 2.50.  Liver function:  SGOT 19, SGPT 22, alkaline  phosphatase 31, bilirubin 0.9.  Urinalysis negative.   X-RAYS:  Chest x-ray:  Stable mild cardiomegaly, moderate changes of  chronic bronchitis.   HOSPITAL COURSE:  The patient, at the time of her admission, was placed  on half-normal saline KVO rate, nasal O2 at 3 liters per minute.  Daily  weights, I&O, Accu-Cheks a.c. and h.s.  She was started on Lasix 40 mg  b.i.d., K-Dur 20 mEq daily, Lantus insulin 45 units daily,  propranolol  80 mg daily, Tekturna 300 mg daily, Synthroid 100 mcg daily, Diovan 320  mg daily, Catapres-TTS patch 0.3 mg weekly, aspirin 81 mg daily, Tricor  145 mg daily.  A 2-D echo was scheduled and cardiology consult was  scheduled with [facility].  The patient stabilized following  admission.  She was given NovoLog insulin according to sliding scale  sensitive.  Her potassium was repleted with K-Dur 20 mEq b.i.d.  She did  have a 2-D echo which shows normal left ventricular size, moderate  hypertrophy, hyperdynamic regional and global function.  The patient has  stabilized enough by April 27, 2007, to discontinue her oxygen.  Her  potassium was decreased by cardiology.  It was noted that she had renal  insufficiency, fairly rapid progression over the past year, questionable  ARB.  The patient was discharged more comfortably  after her  hospitalization.   DISCHARGE MEDICATIONS:  She  was discharged on  1. Lantus insulin 45 units daily.  2. Klor-Con 20 mEq 1 bid.  3. Humalog p.r.n.  4. Propranolol 80 mg daily.  5. Tekturna 300 mg daily.  6. Synthroid 100 mcg daily.  7. Diovan 320 mg daily.  8. Catapres-TTS patch/three once a week.  9. Aspirin 81 mg daily.  10.Tricor 145 mg daily.  11.Lovaza four times a day.  12.Lasix 40 mg bid.  13.Xanax 1 mg h.s. p.r.n.   The patient was instructed to return to the office for follow-up.      Angus G. Renard Matter, MD  Electronically Signed     AGM/MEDQ  D:  05/14/2007  T:  05/14/2007  Job:  [id number]

## 2010-10-08 NOTE — Discharge Summary (Signed)
[name], [name]                         ACCOUNT NO.:  [id number]   MEDICAL RECORD NO.:  [id number]                   PATIENT TYPE:  INP   LOCATION:  4712                                 FACILITY:  [facility]   PHYSICIAN:  Angus G. Renard Matter, M.D.              DATE OF BIRTH:  05-05-1946   DATE OF ADMISSION:  01/11/2002  DATE OF DISCHARGE:  01/14/2002                                 DISCHARGE SUMMARY   DIAGNOSES:  1. Chest pain, possible coronary artery disease.  2. Type 2 diabetes mellitus.  3. Hyperlipidemia.  4. Chronic anxiety.  5. Hypertension.  6. Right carotid stenosis.  7. Urinary tract infection.   CONDITION ON DISCHARGE:  Stable at time of her transfer.   SUBJECTIVE:  A 65 year old female known diabetic and hypertensive had been  having recurrent chest pain over the past three months.  She put this down  to anxiety or indigestion, usually of brief duration.  She was seen by Dr.  Dietrich Pates for management of hypertension.  Was afraid to mention these  episodes.  While watching T.V. prior to admission she felt pressure in  anterior chest.  This continued until she went to work.  At 5:30 p.m. was  pushing heavy load at work.  Chest pain intensified.  Went and sat down with  significant relief.  Pain lasted until she came to the emergency room at  [facility].   PHYSICAL EXAMINATION:  GENERAL:  Uncomfortable female.  VITAL SIGNS:  Blood pressure 211/91 initially, then 154/69, temperature  98.1, pulse 85, respirations 16.  HEENT:  Eyes:  PERRLA.  TMs negative.  Oropharynx benign.  NECK:  Supple.  No JVD or thyroid abnormalities.  LUNGS:  Clear to P&A.  HEART:  Regular rhythm.  No murmurs.  ABDOMEN:  No palpable organs or masses.  EXTREMITIES:  Lower extremities negative.   LABORATORY DATA:  Admission CBC:  WBC 8300 with hemoglobin 14.3, hematocrit  41.5, 63 neutrophils, 26 lymphocytes.  Chemistries:  Sodium 133, potassium  3.5, chloride 101, CO2 28,  glucose 227, BUN 10, creatinine 0.7, calcium 9.  SGOT 20, SGPT 20, alkaline phosphatase 56, bilirubin 0.8.  CPK 104, CPK-MB  4, relative index 3.8, troponin 0.02.  Enzymes on January 12, 2002:  CPK 71,  CPK-MB 3.1, troponin 0.01.  Lipid profile:  Cholesterol 154, triglycerides  295, HDL cholesterol 41, LDL cholesterol 54.  Electrocardiogram:  Normal  sinus rhythm, possible old anterior infarct.   HOSPITAL COURSE:  The patient was admitted, placed at bed rest.  She was  given a diet 2 g low sodium, low cholesterol, 1800 calorie ADA diet.  She  was continued on aspirin 325 mg daily, Lovenox 30 mg subcutaneously q.12h.,  sublingual nitroglycerin p.r.n., propanolol SA 120 mg daily, chlorthalidone  25 mg daily, Zocor 40 mg daily, Synthroid 88 mcg q.d., sliding scale Regular  insulin.  The patient was seen by  cardiology during her stay and it was felt  she would need cardiac catheterization for evaluation.  The patient remained  fairly symptom free after admission and did not need any further medication  for pain.  She did develop what appeared to be a urinary tract infection.  Was placed on Bactrim DS one b.i.d., Pyridium 200 mg t.i.d.  The patient was  stable at time of her discharge on ________.                                               Angus G. Renard Matter, M.D.    AGM/MEDQ  D:  02/22/2002  T:  02/25/2002  Job:  [id number]

## 2010-10-08 NOTE — Op Note (Signed)
NAMELACHELL, [name]   MEDICAL RECORD NO.:  [id number]          PATIENT TYPE:  INP   LOCATION:  2916                         FACILITY:  [facility]   PHYSICIAN:  Salvadore Farber, M.D. LHCDATE OF BIRTH:  08/13/1945   DATE OF PROCEDURE:  06/30/2005  DATE OF DISCHARGE:                                 OPERATIVE REPORT   PROCEDURES:  1.  Coronary angiography.  2.  Selective bilateral renal angiography.  3.  Cutting balloon angioplasty of right renal in-stent restenosis.   INDICATION:  [name] is a 65 year old woman with nonobstructive  atherosclerotic coronary disease, who had prior catheterization a number of  years ago. She is status post stenting of both renal arteries by Dr. Charlett Nose approximately 6 months ago. Fairly shortly after that procedure, her  blood pressure has been poorly controlled (per her report). This poor  control has been despite continued medical compliance and aggressive up-  titration of her medicines by Dr. Renard Matter.   She presented to the hospital yesterday with chest pain occurring at rest.  Electrocardiogram showed nonspecific ST-T abnormalities. Troponin was  slightly elevated. However, blood pressure was 250 systolic. Her renal  function has deteriorated over the past several months, with creatinine  rising from 0.8 to 1.8.   Our concern is that she has developed in-stent restenosis of the renal  artery, producing ligament hypertension and secondary unstable angina.  However, we certainly are also concerned about the possibility of  development of a new coronary lesion. She is therefore brought to the lab  with the intention of selective bilateral renal and coronary angiography,  and possible percutaneous intervention on kidneys.   PROCEDURAL TECHNIQUE:  Informed consent was obtained. The patient was  prehydrated with sodium bicarbonate to minimize the risk of contrast  nephropathy. The patient has a  questionable allergy to lidocaine; Marcaine  was thus used as her anesthetic. Under 1% Marcaine local anesthesia, a 5-  French sheath was placed in the right common femoral artery using modified  Seldinger technique. Selective renal angiography was performed using a 5-  French LIMA catheter. This demonstrated a 99% diffuse in-stent restenosis of  the right renal stent, with approximately 40% in-stent restenosis on the  left. I then performed coronary angiography using JL-4 and JR-4 catheters.   I then proceeded to percutaneous intervention on the right renal artery. The  sheath was upsized over the wire to a 6-French. Anticoagulation was  initiated with heparin, to achieve and maintain an ACT of greater than 250  seconds. A 6-French LIMA guide was advanced over the wire and engaged in the  ostium of the right renal artery. A BMW wire was advanced into the mid  portion of the renal artery without difficulty. I began by dilating the  entirety of the stented segment, including approximately 3 mm distal to the  stent where the in-stent restenosis was proliferative -- using a 4.0 x 10 mm  cutting balloon for a total of 4 inflations each at 10 atmospheres. It was  clear that the stent was undersized relative to the distal  vessel. I  therefore further expanded the stent using a 6 mm Maverick at 6 atmospheres.  With this, both ends of the stent expanded well. However, the mid portion of  the stent did not expand as well. Attempts to produce more pressure resulted  in watermelon seeding. I therefore advanced a 5 x 12 mm Quantum balloon into  the mid portion of the stent, and dilated at 16 atmospheres for 2 min. Final  angiography demonstrated less than 10% residual stenosis, no dissection, and  normal flow to the renal parenchyma.   The arteriotomy was then closed using a Star-Close device. Complete  hemostasis was obtained. She was then transferred to the holding room in  stable condition.    COMPLICATIONS:  None.   FINDINGS:  1.  RIGHT RENAL ARTERY:  99% proliferative in-stent restenosis, treated with      cutting balloon angioplasty to less than 10% residual stenosis.  2.  LEFT RENAL ARTERY:  Approximately 40% in-stent restenosis.  3.  LEFT MAIN: Angiographically normal.  4.  LEFT ANTERIOR DESCENDING : Moderate-sized vessel with a 30% stenosis in      the mid vessel,  and a 20% stenosis of the distal vessel. It gives rise      to 3 diagonals; The first one has a 70% ostial stenosis.  5.  CIRCUMFLEX:  A fairly large vessel that gives rise to a very large      branching obtuse marginal. This vessel has luminal irregularities      throughout its course. There are no significant stenoses.  6.  RIGHT CORONARY ARTERY: Moderate-sized, dominant vessel. It has luminal      irregularities throughout its course.   IMPRESSION/PLAN:  1.  Moderate nonobstructive coronary disease.  2.  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  3.  Mild in-stent restenosis of the left renal artery stent.   We will need to watch closely for a precipitous drop in her blood pressure  after treatment of the renal artery stenosis. To that end, we will  discontinue her clonidine patch and her nitroglycerin. We will continue  labetalol for the short term. Will anticipate her remaining in the hospital  until her blood pressure is stabilized.      Salvadore Farber, M.D. [facility]  Electronically Signed     WED/MEDQ  D:  06/30/2005  T:  07/01/2005  Job:  [id number]   cc:   Angus G. Renard Matter, MD  Fax: [phone]   [place] Bing, M.D. [facility]  [address]  [address]  [place]  [place] [address]

## 2010-10-08 NOTE — Progress Notes (Signed)
[facility]                          PERIPHERAL VASCULAR OFFICE NOTE   NAME:Thaden, DARIN REDMANN                     MRN:          [id number]  DATE:12/09/2005                            DOB:          16-Jan-1946    HISTORY OF PRESENT ILLNESS:  Ms. Alben Deeds is status post repeat stenting of  her right renal artery two days ago.  She called in today complaining of  unusual pain in her left common femoral access site.  She has had no fever  or chills and no claudication.  She does have an approximately 0.5 cm  hematoma overlying the access site.  There is minimal surrounding  ecchymosis.  There is a prominent bruit which sounds slightly louder than  previous.  I obtained a duplex ultrasound which showed no pseudoaneurysm or  AV fistula.   IMPRESSION/PLAN:  Unremarkable groin after access.  She will follow up with  me as scheduled.  Blood pressure is somewhat improved.  Will follow over  time, now that both renal arteries are widely patent.  Followup as  previously scheduled.                                   Salvadore Farber, MD   WED/MedQ  DD:  12/09/2005  DT:  12/09/2005  Job #:  [id number]

## 2010-10-08 NOTE — Group Therapy Note (Signed)
   NAME:  [name], [name]                         ACCOUNT NO.:  [id number]   MEDICAL RECORD NO.:  [id number]                   PATIENT TYPE:  INP   LOCATION:  A204                                 FACILITY:  [facility]   PHYSICIAN:  Angus G. Renard Matter, M.D.              DATE OF BIRTH:  1946-02-05   DATE OF PROCEDURE:  05/25/2002  DATE OF DISCHARGE:  05/25/2002                                   PROGRESS NOTE   SUBJECTIVE:  This patient was admitted with bronchopneumonia, shortness of  breath, and mild pleural effusion.  She remains afebrile.   OBJECTIVE:  Vital signs:  Blood pressure 153/67, respirations 20, pulse 68,  temperature 98.4.  Heart:  Regular rhythm.  Lungs:  Clear to P&A.  Abdomen:  No palpable organs or masses.   ASSESSMENT:  The patient was admitted to the hospital with shortness of  breath, bronchopneumonia.  She remains on nebulizer treatments and IV  antibiotic.   PLAN:  To repeat chest x-ray today.                                                Angus G. Renard Matter, M.D.    AGM/MEDQ  D:  05/25/2002  T:  05/25/2002  Job:  [id number]

## 2010-10-08 NOTE — Progress Notes (Signed)
[facility]                        PERIPHERAL VASCULAR OFFICE [name]   NAME:[name], Anita HUMPHRIES                     MRN:          [id number]  DATE:06/14/2006                            DOB:          1945-08-20    PRIMARY CARE PHYSICIAN:  Angus G. Renard Matter, M.D.   HISTORY OF PRESENT ILLNESS:  [name] is a 65 year old woman with  diffuse atherosclerotic vascular disease and malignant hypertension.  She has drug-eluting stents in both renal arteries after twice  restenosing bare metal stents.  She has multiple medication intolerances  which has contributed to the difficulty in controlling her hypertension.   Last week I suggested that we begin minoxidil.  She then called in later  that day, having said she will not take it.  We, therefore, tried  hydralazine at 10 mg three times a day in addition to her usual regimen  as detailed below.  She brings in blood pressures today which remain  markedly elevated.  Fortunately, she has not been having any chest  discomfort, dyspnea, headache or visual abnormality.  She does describe  some occasional wheezing, particularly at night.   CURRENT MEDICATIONS:  1. Lantus insulin.  2. Synthroid.  3. Vytorin.  4. Diovan 320 mg per day.  5. HCTZ 25 mg per day.  6. Flax seed oil.  7. Aspirin 81 mg per day.  8. Multivitamin.  9. Inderal 80 mg twice per day.  10.Hydralazine 10 mg three times per day.   PHYSICAL EXAMINATION:  GENERAL:  She continues to be fatigued appearing  and smell of tobacco smoke.  VITAL SIGNS:  Heart rate 71, blood pressure 202/90.  Weight is 150  pounds.  Blood pressures from home range from 172/57 to 240/97 with the  majority in the approximately 205 systolic range.  NECK:  She has no jugular venous distention, thyromegaly or  lymphadenopathy.  LUNGS:  Clear to auscultation.  Respiratory effort is normal.  She has a  nondisplaced point of maximal cardiac impulse.  HEART:  There is a  regular rate and rhythm with normal S1 and S2.  There  is no murmur or S3.  The S4 is moderately loud.  ABDOMEN:  Soft, nondistended, nontender.  There is no hepatosplenomegaly  and no abdominal bruit.  No midline pulsatile mass.  EXTREMITIES:  Warm and without edema.   IMPRESSION/RECOMMENDATIONS:  1. Malignant hypertension.  Increase hydralazine to 20 mg three times      per day.  Follow her up      in a week.  Plan to increase the hydralazine to 50 mg three times      per day thereafter.  The patient refused the minoxidil out of      concern with hair growth.     Salvadore Farber, MD  Electronically Signed    WED/MedQ  DD: 06/14/2006  DT: 06/14/2006  Job #: [id number]

## 2010-10-08 NOTE — Assessment & Plan Note (Signed)
[facility]                            CARDIOLOGY OFFICE NOTE   NAME:[name], [name]                     MRN:          [id number]  DATE:06/06/2006                            DOB:          08/17/1945    PRIMARY CARE PHYSICIAN:  Dr. Butch Penny.   HISTORY OF PRESENT ILLNESS:  [name] is a 65 year old woman with  diffuse atherosclerotic vascular disease.  She has had prior bilateral  renal artery stenting with 2-time restenosis, and has now received  bilateral drug-eluting stent in the renal arteries.  Angiogram in July  2007 showed approximately 40% in-stent restenosis of the left renal  artery's drug-eluting stent.  Ultrasound just a few weeks thereafter  showed a high-velocity but low renal aortic ratio in that kidney.  Her  blood pressure has continued to be difficult to treat.  We tried to  increase her Inderal last week.  She said she felt horrible and blood  pressure was no better.  She brings in twice-daily blood pressure  measurements from home which range from a low of 181/61 to a high of  231/90.  She has been having some chest discomfort which she correlates  with the highest blood pressures.  She has had some tinnitus but no  visual abnormality.   CURRENT MEDICATIONS:  1. Lantus insulin.  2. Synthroid.  3. Vytorin.  4. Diovan 320 mg per day.  5. Hydrochlorothiazide 25 mg per day.  6. Flax seed oil.  7. Aspirin 81 mg per day.  8. Multivitamin.  9. Inderal 160 mg twice per day.   PHYSICAL EXAMINATION:  She is fatigued-appearing, in no distress.  Heart rate 56, blood pressure 220/92 on the left and 200/92 on the  right.  Weight is 148 pounds.  She has no jugular venous distention, thyromegaly or lymphadenopathy.  LUNGS:  Clear to auscultation.  Her respiratory effort is normal.  CARDIAC:  She has a nondisplaced point of maximal cardiac impulse.  There is a regular rate and rhythm with normal S1 and S2.  No murmur or  S3.   Moderately loud S4.  ABDOMEN:  Soft, nondistended and nontender.  There is no  hepatosplenomegaly and no abdominal bruit.  No midline pulsatile mass.  EXTREMITIES:  Warm without edema.   IMPRESSION/RECOMMENDATIONS:  Malignant hypertension:  I do not think  this is due to renal artery stenosis.  Medication intolerances make  management difficult.  She does not feel well with the Inderal, and it  is not controlling her blood pressure.  We will therefore decrease it to  80 milligrams twice per day.  We will initiate minoxidil 5 milligrams  once per day, and see her back in a week.  I have cautioned the patient  about the side effects of minoxidil including unusual hair growth,  increased angina, and the development of pericardial effusions.  We will  continue her beta blocker and diuretic to minimize these risks.   I will see her back in a week.     Salvadore Farber, MD  Electronically Signed    WED/MedQ  DD: 06/06/2006  DT: 06/07/2006  Job #: [id number]   cc:   Angus G. Renard Matter, MD

## 2010-10-08 NOTE — Op Note (Signed)
[name], [name]                         ACCOUNT NO.:  [id number]   MEDICAL RECORD NO.:  [id number]                   PATIENT TYPE:  INP   LOCATION:  4712                                 FACILITY:  [facility]   PHYSICIAN:  Doylene Canning. Ladona Ridgel, M.D. [facility]           DATE OF BIRTH:  09/10/1945   DATE OF PROCEDURE:  01/14/2002  DATE OF DISCHARGE:  01/15/2002                                 OPERATIVE REPORT   PROCEDURE:  Left heart catheterization with coronary angiography and left  ventriculography.   INDICATIONS:  New-onset unstable angina with exertional chest pain,  shortness of breath, nausea, vomiting, and diaphoresis.   INTRODUCTION:  The patient is a very pleasant 65 year old woman with tobacco  abuse, hypertension, and diabetes, who was admitted to the hospital with  chest pain and subsequently referred for left heart catheterization.  EKG  demonstrated nonspecific ST-T wave abnormality.   DESCRIPTION OF PROCEDURE:  After informed consent was obtained, the patient  was taken to the diagnostic catheterization lab in the fasted state.  After  the usual preparation and draping, intravenous fentanyl and midazolam were  given for sedation.  A 6 French hemostatic sheath was placed in the right  femoral artery and the left Judkins catheter was inserted and advanced into  the left main coronary artery.  Coronary angiography of the left main system  was then carried out.  The left Judkins catheter was removed and the right  Judkins catheter was inserted into the right coronary artery, and coronary  angiography of the right coronary system was carried out.  The right  coronary catheter was removed and the pigtail catheter was inserted  retrograde across the aortic valve into the left ventricle.  Left  ventriculography in the RAO projection with a total of 30 cc of contrast was  carried out.  The catheters were then removed, hemostasis was assured, and  the patient returned to her room in  satisfactory condition.   COMPLICATIONS:  There were no immediate procedural complications.   RESULTS:  1. HEMODYNAMICS:  The LV pressure was 207/8 and the aortic pressure 200/80.   1. LEFT VENTRICULOGRAPHY:  Left ventriculography was performed in the RAO     projection with a total of 30 cc of contrast delivered at a rate of 12     cc/sec.  This demonstrated no segmental wall motion abnormalities.  There     was concentric LVH.   1. CORONARY ANGIOGRAPHY:  The left main coronary artery was angiographically     normal.  It gave rise to the left anterior descending and the left     circumflex coronary artery.  The left anterior descending had a proximal     calcification and a 40% proximal stenosis.  It gave rise to two diagonal     branches, which were free of angiographically significant disease.  The     left circumflex gave rise to two  obtuse marginal branches, one of which     was a dominant vessel.  There were very minimal luminal irregularities in     the left circumflex system.  The right coronary artery was the dominant     vessel.  It gave rise to the PDA and two PLSA branches.  The right     coronary artery was free of angiographically significant disease, as were     its branches.    CONCLUSION:  This study demonstrates preserved left ventricular systolic  function with no segmental wall motion abnormalities.  In addition, there  were no obstructive coronary lesions.  There was modest calcification in the  proximal left anterior descending coronary artery with a 40% proximal  stenosis.                                                  Doylene Canning. Ladona Ridgel, M.D. [facility]    GWT/MEDQ  D:  01/14/2002  T:  01/17/2002  Job:  [id number]   cc:   Angus G. Renard Matter, M.D.   Thomas C. Wall, M.D. [facility]

## 2010-10-08 NOTE — Procedures (Signed)
NAMEDEARIA, WILMOUTH              ACCOUNT NO.:  [id number]   MEDICAL RECORD NO.:  [id number]          PATIENT TYPE:  OUT   LOCATION:  RESP                          FACILITY:  [facility]   PHYSICIAN:  Edward L. Juanetta Gosling, M.D.DATE OF BIRTH:  09/16/1945   DATE OF PROCEDURE:  DATE OF DISCHARGE:                            PULMONARY FUNCTION TEST   PULMONARY FUNCTION TEST  1. Spirometry is essentially normal, but does show mild airflow      obstruction at the level of the smaller airways.  2. Lung volumes are normal.  3. DLCO is mildly reduced.  Noting the patient's smoking history, this study would be consistent  with COPD.      Edward L. Juanetta Gosling, M.D.  Electronically Signed     ELH/MEDQ  D:  02/01/2008  T:  02/01/2008  Job:  [id number]   cc:   Angus G. Renard Matter, MD  Fax: [phone]

## 2010-10-08 NOTE — Discharge Summary (Signed)
NAMEMARIANN, PALO NO.:  [id number]   MEDICAL RECORD NO.:  [id number]          PATIENT TYPE:  OIB   LOCATION:  5703                         FACILITY:  [facility]   PHYSICIAN:  Salvadore Farber, M.D. LHCDATE OF BIRTH:  11-Jul-1945   DATE OF ADMISSION:  08/03/2005  DATE OF DISCHARGE:  08/04/2005                                 DISCHARGE SUMMARY   TIME AT DISCHARGE:  35 minutes.   PROCEDURES:  1.  Drug-eluting stent to left renal artery.  2.  Selective left subclavian angiogram.  3.  Selective left renal angiogram.  4.  Stent x2 to the left subclavian.   DISCHARGE DIAGNOSES:  1.  Peripheral vascular disease, renal artery stenosis and subclavian      stenosis treated this admission.  2.  Hypertension.  3.  Hyperlipidemia.  4.  Insulin-dependent diabetes.  5.  Hypothyroidism.  6.  Ongoing tobacco use.  7.  Status post cardiac catheterization in 2003 with 40% left anterior      descending artery stenosis and concentric left ventricular hypertrophy      on left ventriculogram.  8.  Family history of coronary artery disease.  9.  History of anxiety.  10. Nephrolithiasis with lithotripsy in 1999.   ALLERGIES:  LIDOCAINE, PENICILLIN, CIPRO, CODEINE, GLUCOPHAGE, GLYNASE,  NORVASC, PSEUDOEPHEDRINE, METOPROLOL.   HOSPITAL COURSE:  Mrs. Hainsworth is a 65 year old female with a history of  malignant hypertension. She had also had progressive deterioration in her  kidney function. She was evaluated by Dr. Samule Ohm in the office on July 26, 2005 and renal artery duplex showed possible recurrent renal artery  stenosis. She was also having lightheadedness with raising her arms and  decreased pulses in her left arm as well as a 40 mm gradient. It was felt  that she needed further evaluation with angiography and possible stenting  and she was admitted for this on August 03, 2005.   The procedure went well and she received two stents to her left subclavian  as well as a  drug-eluting stent to the left renal artery for in-stent  restenosis. She tolerated the procedure well. She had some postprocedure  nausea but it was felt that was probably secondary to narcotics. She was  treated with Zofran and did well. She received no further narcotics.   The next day, her blood pressures were equal in both arms. Her BUN and  creatinine were within normal limits at 17/1.0. She had been having side-  effects with the verapamil and this was discontinued. The Diovan was  increased and HCT had been added for better blood pressure control. She was  to check a BMET next week and get a renal ultrasound at the first available  opportunity. She was strongly advised to quit smoking and was advised to  obtain a nicotine patch as well as being given a prescription for Chantix.  Her husband said he would quit smoking as well. Mrs. Vlachos was evaluated  by Dr. Samule Ohm and considered stable for discharge on August 04, 2005 with  outpatient follow-up arranged.   DISCHARGE INSTRUCTIONS:  Her activity  level is to be increased gradually.  She is not to drive for two days and not to lift for two weeks. She is to  stick to a low-fat, diabetic diet. She is to get a BMET next week and the  office is aware. She is to get a renal ultrasound on August 23, 2005 at 8:30  and follow up with cardiology as well as Dr. Renard Matter.   DISCHARGE MEDICATIONS:  1.  Aspirin 325 milligrams a day.  2.  Chantix as directed, increasing to a dose of 1 milligram b.i.d.  3.  Nicotine patch daily 21 milligrams and taper down until discontinue.  4.  Plavix 75 milligrams a day.  5.  Lantus insulin 40 units q.h.s.  6.  Alprazolam 0.5 milligrams p.r.n.  7.  Vytorin 10/20 daily.  8.  Verapamil was discontinued.  9.  Diovan/HCT 320/25 mg a day.  10. Synthroid 0.088 milligrams every day except on Monday and Friday when      she takes 0.0044 milligrams.  11. Labetalol 300 milligrams b.i.d.  12. Catapres TTP 1 patch  every 7 days.      Theodore Demark, P.A. [facility]      Salvadore Farber, M.D. [facility]  Electronically Signed    RB/MEDQ  D:  08/04/2005  T:  08/05/2005  Job:  [id number]   cc:   Angus G. Renard Matter, MD  Fax: [phone]

## 2010-10-08 NOTE — Op Note (Signed)
NAMEDAYLIN, EADS NO.:  [id number]   MEDICAL RECORD NO.:  [id number]          PATIENT TYPE:  OIB   LOCATION:  5703                         FACILITY:  [facility]   PHYSICIAN:  Salvadore Farber, M.D. LHCDATE OF BIRTH:  [birth year]   DATE OF PROCEDURE:  08/03/2005  DATE OF DISCHARGE:  08/04/2005                                 OPERATIVE REPORT   NO DICTATION      Salvadore Farber, M.D. [facility]  Electronically Signed     WED/MEDQ  D:  08/03/2005  T:  08/04/2005  Job:  [id number]

## 2011-01-22 HISTORY — PX: CARDIAC CATHETERIZATION: SHX172

## 2011-01-28 ENCOUNTER — Ambulatory Visit (HOSPITAL_COMMUNITY)
Admission: RE | Admit: 2011-01-28 | Discharge: 2011-01-29 | Disposition: A | Discharge status: Home / Self Care | Payer: BC Managed Care – PPO | Source: Ambulatory Visit | Attending: Cardiovascular Disease | Admitting: Cardiovascular Disease

## 2011-01-28 DIAGNOSIS — E039 Hypothyroidism, unspecified: Secondary | ICD-10-CM | POA: Insufficient documentation

## 2011-01-28 DIAGNOSIS — E785 Hyperlipidemia, unspecified: Secondary | ICD-10-CM | POA: Insufficient documentation

## 2011-01-28 DIAGNOSIS — E663 Overweight: Secondary | ICD-10-CM | POA: Insufficient documentation

## 2011-01-28 DIAGNOSIS — E119 Type 2 diabetes mellitus without complications: Secondary | ICD-10-CM | POA: Insufficient documentation

## 2011-01-28 DIAGNOSIS — Z01810 Encounter for preprocedural cardiovascular examination: Secondary | ICD-10-CM | POA: Insufficient documentation

## 2011-01-28 DIAGNOSIS — R079 Chest pain, unspecified: Secondary | ICD-10-CM | POA: Insufficient documentation

## 2011-01-28 DIAGNOSIS — D649 Anemia, unspecified: Secondary | ICD-10-CM | POA: Insufficient documentation

## 2011-01-28 DIAGNOSIS — I5030 Unspecified diastolic (congestive) heart failure: Secondary | ICD-10-CM | POA: Insufficient documentation

## 2011-01-28 DIAGNOSIS — Z794 Long term (current) use of insulin: Secondary | ICD-10-CM | POA: Insufficient documentation

## 2011-01-28 DIAGNOSIS — I10 Essential (primary) hypertension: Secondary | ICD-10-CM | POA: Insufficient documentation

## 2011-01-28 DIAGNOSIS — R0609 Other forms of dyspnea: Secondary | ICD-10-CM | POA: Insufficient documentation

## 2011-01-28 DIAGNOSIS — I701 Atherosclerosis of renal artery: Secondary | ICD-10-CM | POA: Insufficient documentation

## 2011-01-28 DIAGNOSIS — N289 Disorder of kidney and ureter, unspecified: Secondary | ICD-10-CM | POA: Insufficient documentation

## 2011-01-28 DIAGNOSIS — I2789 Other specified pulmonary heart diseases: Secondary | ICD-10-CM | POA: Insufficient documentation

## 2011-01-28 DIAGNOSIS — R0989 Other specified symptoms and signs involving the circulatory and respiratory systems: Secondary | ICD-10-CM | POA: Insufficient documentation

## 2011-01-28 LAB — GLUCOSE, CAPILLARY: Glucose-Capillary: 184 mg/dL — ABNORMAL HIGH (ref 70–99)

## 2011-01-28 LAB — MRSA PCR SCREENING: MRSA by PCR: NEGATIVE

## 2011-01-29 LAB — BASIC METABOLIC PANEL
BUN: 28 mg/dL — ABNORMAL HIGH (ref 6–23)
CO2: 28 mEq/L (ref 19–32)
Chloride: 104 mEq/L (ref 96–112)
Creatinine, Ser: 1.27 mg/dL — ABNORMAL HIGH (ref 0.50–1.10)
Glucose, Bld: 141 mg/dL — ABNORMAL HIGH (ref 70–99)
Potassium: 4.3 mEq/L (ref 3.5–5.1)

## 2011-01-29 LAB — CBC
HCT: 30.2 % — ABNORMAL LOW (ref 36.0–46.0)
Hemoglobin: 9.9 g/dL — ABNORMAL LOW (ref 12.0–15.0)
MCV: 88.8 fL (ref 78.0–100.0)
RBC: 3.4 MIL/uL — ABNORMAL LOW (ref 3.87–5.11)
WBC: 10.9 10*3/uL — ABNORMAL HIGH (ref 4.0–10.5)

## 2011-01-29 LAB — GLUCOSE, CAPILLARY: Glucose-Capillary: 82 mg/dL (ref 70–99)

## 2011-01-29 LAB — RHEUMATOID FACTOR: Rhuematoid fact SerPl-aCnc: 10 IU/mL (ref <=14)

## 2011-01-31 ENCOUNTER — Telehealth: Payer: Self-pay | Admitting: Internal Medicine

## 2011-01-31 LAB — ANA: Anti Nuclear Antibody(ANA): NEGATIVE

## 2011-01-31 LAB — JO-1 ANTIBODY-IGG: Jo-1 Antibody, IgG: <1 AU/mL (ref <30)

## 2011-01-31 NOTE — Telephone Encounter (Signed)
[name] has blocked time for this patient as a new consult on Thurs., 02/03/2011 @ 2:15pm with Dr. Marchelle Gearing. Per pt's daughter Lynden Ang, the pt will be able to make this appt date and time. She knows to have the pt arrive 15 minutes early.

## 2011-02-03 ENCOUNTER — Encounter: Payer: Self-pay | Admitting: Internal Medicine

## 2011-02-03 ENCOUNTER — Ambulatory Visit (INDEPENDENT_AMBULATORY_CARE_PROVIDER_SITE_OTHER): Payer: BC Managed Care – PPO | Admitting: Internal Medicine

## 2011-02-03 VITALS — BP 134/82 | HR 60 | Temp 97.8°F | Ht 66.0 in | Wt 180.8 lb

## 2011-02-03 DIAGNOSIS — R0600 Dyspnea, unspecified: Secondary | ICD-10-CM

## 2011-02-03 DIAGNOSIS — I82409 Acute embolism and thrombosis of unspecified deep veins of unspecified lower extremity: Secondary | ICD-10-CM

## 2011-02-03 DIAGNOSIS — I272 Pulmonary hypertension, unspecified: Secondary | ICD-10-CM

## 2011-02-03 DIAGNOSIS — I2782 Chronic pulmonary embolism: Secondary | ICD-10-CM

## 2011-02-03 DIAGNOSIS — I2789 Other specified pulmonary heart diseases: Secondary | ICD-10-CM

## 2011-02-03 NOTE — Patient Instructions (Signed)
Please get vq scan for chronic pe Please get doppler legs for dvt evaluation Please get full PFTs [place]les[place] do 6 min walk test Please sign release to get records from Dr Eligah East at [facility] and Dr Hyman Hopes at [facility]; we need these records asap Need sleep study results from Dr Allyson Sabal Return to see me in < 2 weeks after above

## 2011-02-03 NOTE — Progress Notes (Signed)
Subjective:    Patient ID: [name], female    DOB: Aug 10, 1945, 65 y.o.   MRN: [id number]  HPI 65 year old female. Ex-smoker. 1 pack x 25 years. Quit 2005. Chronic Kidney Disease. Hypertension. Diabetes. Hyperlipidemia. Obesity. 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 Referred by Dr Allyson Sabal for dyspnea  Reports dyspnea of insidious onset present for 3-5 years. Brought on by exertion for activities like < 1/4 block, 9 steps, or changing clothes. Relieved by rest and recent lasix started by Dr Allyson Sabal after cath last week.  Gradually progressive but in past 6 weeks rapidly worse. Rates current dyspnea as severe. Dyspnea associated for same duration with severe central chest pain,  nausea, sweating, tingling and dizziness.   Recollects extensive workup for past few years for dyspnea (not diagnosed with pulm htn during this workup) including VQ scan 2010 -neg for PE, 2009 PFT at [facility] - normal per echart except mild reduction in dlco. Left heart cath in 2009 left heart cath that was normal except for 50% LAD proximal stenosis. ECHO Nov 2010 (echart): ef 55% with LVH but otherwise normal including RV. CT abdomen lung cut in 2006 - no evidence of pulmonary fibrosis. Reportedly has sleep study in in 2011 and was advised o2 without cpap. Then on 01/28/11 underwent Left renal artery stent for restenosis and poorly controlled sbp. This time she underwent right and left heart cath on 01/28/11 (resulots below) and referred here  1. Aortic systolic pressure 217, diastolic pressure 72.   2. Left ventricular systolic pressure 224, end-diastolic pressure 31.   3. RA pressure A-wave 23, V-wave 18, mean 15.   4. RV pressure systolic 108, end-diastolic pressure 11.   5. Pulmonary artery pressure systolic 110, diastolic pressure 31, mean 67.   6. Pulmonary capillary wedge pressure A-wave 51, V-wave 16, mean 43. Transpulm gradient 24     SELECTIVE  CORONARY ANGIOGRAPHY:   1. Left main normal.   2. LAD normal.   3. Left circumflex normal.   4. Right coronary was dominant and normal.     Of note, she is following with Dr Eligah East endocrine at [facility]  And another neuromuscular MD since Dec 2011 for high CPK 1800 and hyperlipidemia. 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 Since then weight gain 20# since starting steroids to 180#  Denies weight loss drugs intake. Denies coccaine intake, marijuana. Denies HIV positive status. Denies PE or DVT in past.   Review of Systems  Constitutional: Positive for unexpected weight change. Negative for fever.  HENT: Positive for ear pain and sore throat. Negative for nosebleeds, congestion, rhinorrhea, sneezing, trouble swallowing, dental problem, postnasal drip and sinus pressure.   Eyes: Negative for redness and itching.  Respiratory: Positive for cough and shortness of breath. Negative for chest tightness and wheezing.   Cardiovascular: Positive for chest pain, palpitations and leg swelling.  Gastrointestinal: Negative for nausea and vomiting.  Genitourinary: Negative for dysuria.  Musculoskeletal: Negative for joint swelling.  Skin: Negative for rash.  Neurological: Negative for headaches.  Hematological: Does not bruise/bleed easily.  Psychiatric/Behavioral: Positive for dysphoric mood. The patient is nervous/anxious.        Objective:   Physical Exam  Vitals reviewed. Constitutional: She is oriented to person, place, and time. She appears well-developed and well-nourished. No distress.       Obese Cushingoid  HENT:  Head: Normocephalic and atraumatic.  Right Ear: External ear normal.  Left Ear: External ear normal.  Mouth/Throat: Oropharynx is clear and moist. No oropharyngeal exudate.  Eyes: Conjunctivae and EOM are normal. Pupils are equal, round, and reactive to light. Right eye exhibits no discharge. Left  eye exhibits no discharge. No scleral icterus.  Neck: Normal range of motion. Neck supple. No JVD present. No tracheal deviation present. No thyromegaly present.  Cardiovascular: Normal rate, regular rhythm and intact distal pulses.  Exam reveals no gallop and no friction rub.   Murmur heard.      Loud P2 Pulmonary ejection systolic murmur +  Pulmonary/Chest: Effort normal and breath sounds normal. No respiratory distress. She has no wheezes. She has no rales. She exhibits no tenderness.  Abdominal: Soft. Bowel sounds are normal. She exhibits no distension and no mass. There is no tenderness. There is no rebound and no guarding.  Musculoskeletal: Normal range of motion. She exhibits no edema and no tenderness.  Lymphadenopathy:    She has no cervical adenopathy.  Neurological: She is alert and oriented to person, place, and time. She has normal reflexes. No cranial nerve deficit. She exhibits normal muscle tone. Coordination normal.  Skin: Skin is warm and dry. No rash noted. She is not diaphoretic. No erythema. No pallor.  Psychiatric: She has a normal mood and affect. Her behavior is normal. Judgment and thought content normal.          Assessment & Plan:

## 2011-02-04 ENCOUNTER — Telehealth: Payer: Self-pay | Admitting: Internal Medicine

## 2011-02-04 DIAGNOSIS — I272 Pulmonary hypertension, unspecified: Secondary | ICD-10-CM | POA: Insufficient documentation

## 2011-02-04 NOTE — Assessment & Plan Note (Signed)
Transpulmonary gradient is 24 and is suggestive of non-cardiac /non-passive etiology for pulm htn. PCWP is 43 and consistent with associated diastolic dysfunction given otherwise normal cardiac cath. This is mixed picture. Pumonary Vascular resistance data not available at this point. She has class 3 dyspnea and this is disabling and her main concern.   Explained to her we need etiological workup first. Multifactorial etiology likely. I really need to know the reason for myopathy and steroids (i.e, autoimmune disease). Will need [facility] records for that.  Need to repeat PFT, 6 min walk and rule out chronic PE. She is also obese; she had sleep study before weight gain related to prednisone. Perhaps this needs to be repeated  PLAN Please get vq scan for chronic pe Please get doppler legs for dvt evaluation Please get full PFTs Plesae do 6 min walk test Please sign release to get records from Dr Eligah East at [facility] and Dr Hyman Hopes at [facility]; we need these records asap Need sleep study results from Dr Allyson Sabal and Dr Allyson Sabal to consider repeatinng these Return to see me in < 2 weeks after above Based on above, consider oxygen, coumadin, and perhaps bosentan/ambrisentan for class 3 dyspnea v sildenafil If worse, return sooner

## 2011-02-04 NOTE — Telephone Encounter (Signed)
jen  Please get her [facility] records from Dr Eligah East (endocrine) and Dr. Hyman Hopes (neuromuscular specialist) ASAP  Thanks  MR

## 2011-02-10 ENCOUNTER — Encounter (HOSPITAL_COMMUNITY): Payer: Self-pay

## 2011-02-10 ENCOUNTER — Encounter (HOSPITAL_COMMUNITY)
Admission: RE | Admit: 2011-02-10 | Discharge: 2011-02-10 | Disposition: A | Discharge status: Home / Self Care | Payer: BC Managed Care – PPO | Source: Ambulatory Visit | Attending: Internal Medicine | Admitting: Internal Medicine

## 2011-02-10 ENCOUNTER — Ambulatory Visit (HOSPITAL_COMMUNITY)
Admission: RE | Admit: 2011-02-10 | Discharge: 2011-02-10 | Disposition: A | Discharge status: Home / Self Care | Payer: BC Managed Care – PPO | Source: Ambulatory Visit | Attending: Internal Medicine | Admitting: Internal Medicine

## 2011-02-10 ENCOUNTER — Inpatient Hospital Stay (HOSPITAL_COMMUNITY): Admission: RE | Admit: 2011-02-10 | Payer: BC Managed Care – PPO | Source: Ambulatory Visit

## 2011-02-10 DIAGNOSIS — I10 Essential (primary) hypertension: Secondary | ICD-10-CM | POA: Insufficient documentation

## 2011-02-10 DIAGNOSIS — R0602 Shortness of breath: Secondary | ICD-10-CM | POA: Insufficient documentation

## 2011-02-10 DIAGNOSIS — I2782 Chronic pulmonary embolism: Secondary | ICD-10-CM

## 2011-02-10 DIAGNOSIS — R0789 Other chest pain: Secondary | ICD-10-CM | POA: Insufficient documentation

## 2011-02-10 DIAGNOSIS — Z79899 Other long term (current) drug therapy: Secondary | ICD-10-CM | POA: Insufficient documentation

## 2011-02-10 DIAGNOSIS — E785 Hyperlipidemia, unspecified: Secondary | ICD-10-CM | POA: Insufficient documentation

## 2011-02-10 DIAGNOSIS — R0989 Other specified symptoms and signs involving the circulatory and respiratory systems: Secondary | ICD-10-CM | POA: Insufficient documentation

## 2011-02-10 DIAGNOSIS — I700 Atherosclerosis of aorta: Secondary | ICD-10-CM | POA: Insufficient documentation

## 2011-02-10 DIAGNOSIS — I82409 Acute embolism and thrombosis of unspecified deep veins of unspecified lower extremity: Secondary | ICD-10-CM

## 2011-02-10 DIAGNOSIS — Z86718 Personal history of other venous thrombosis and embolism: Secondary | ICD-10-CM | POA: Insufficient documentation

## 2011-02-10 MED ORDER — TECHNETIUM TO 99M ALBUMIN AGGREGATED
5.5000 | Freq: Once | INTRAVENOUS | Status: AC | PRN
  Administered 2011-02-10: 5.5 via INTRAVENOUS

## 2011-02-10 MED ORDER — XENON XE 133 GAS
6.7000 | GAS_FOR_INHALATION | Freq: Once | RESPIRATORY_TRACT | Status: AC | PRN
  Administered 2011-02-10: 6.7 via RESPIRATORY_TRACT

## 2011-02-14 ENCOUNTER — Telehealth: Payer: Self-pay | Admitting: Internal Medicine

## 2011-02-14 NOTE — Telephone Encounter (Signed)
Probably [name] called. Maybe I called; cannot remember but to say VQ scan is normal and doppler leg no dvt. Also it is a maze to get records from [facility]. If she could call and get records from both doctros there faxed to [place] ASAP it would be helpful./ Yes, shee needs to finish tests soon

## 2011-02-14 NOTE — Telephone Encounter (Signed)
This is a duplicate message, see result note. Carron Curie, CMA

## 2011-02-14 NOTE — Telephone Encounter (Signed)
Called and spoke with pt. Pt states she is returning MR"s call.  States he had left a message on her VM.  Pt also wanted to know if she needs to keep her PFT and test for tomorrow which I informed her she did.  Will forward message to MR so he can call pt.

## 2011-02-15 ENCOUNTER — Ambulatory Visit (INDEPENDENT_AMBULATORY_CARE_PROVIDER_SITE_OTHER): Payer: BC Managed Care – PPO | Admitting: Internal Medicine

## 2011-02-15 DIAGNOSIS — R0609 Other forms of dyspnea: Secondary | ICD-10-CM

## 2011-02-15 DIAGNOSIS — I2789 Other specified pulmonary heart diseases: Secondary | ICD-10-CM

## 2011-02-15 DIAGNOSIS — I2782 Chronic pulmonary embolism: Secondary | ICD-10-CM

## 2011-02-15 DIAGNOSIS — J449 Chronic obstructive pulmonary disease, unspecified: Secondary | ICD-10-CM

## 2011-02-15 DIAGNOSIS — R0600 Dyspnea, unspecified: Secondary | ICD-10-CM

## 2011-02-15 DIAGNOSIS — I272 Pulmonary hypertension, unspecified: Secondary | ICD-10-CM

## 2011-02-15 LAB — BASIC METABOLIC PANEL
BUN: 39 — ABNORMAL HIGH
CO2: 25
Calcium: 9.8
Chloride: 102
Glucose, Bld: 308 — ABNORMAL HIGH
Glucose, Bld: 151 — ABNORMAL HIGH
Potassium: 3.5
Potassium: 3.7
Sodium: 135

## 2011-02-15 LAB — CBC
HCT: 39.3
MCHC: 33.7
MCV: 88.6
Platelets: 228
RDW: 16.2 — ABNORMAL HIGH

## 2011-02-15 NOTE — Telephone Encounter (Signed)
Pt is aware of her results and is going to call [facility] to get her records. She is also aware to come for her tests today

## 2011-02-15 NOTE — Progress Notes (Signed)
PFT done today. 

## 2011-02-20 NOTE — Discharge Summary (Signed)
NAMEJOHNATHAN, TORTORELLI NO.:  [id number]  MEDICAL RECORD NO.:  [id number]  LOCATION:  2923                         FACILITY:  [facility]  PHYSICIAN:  Nanetta Batty, M.D.   DATE OF BIRTH:  Mar 07, 1946  DATE OF ADMISSION:  01/28/2011 DATE OF DISCHARGE:  01/29/2011                              DISCHARGE SUMMARY   DISCHARGE DIAGNOSES: 1. Accelerated chest pain with upper extremity radiation and severe     dyspnea with abnormal stress Myoview with subtle anterolateral     ischemia.     a.     Patent coronary arteries by cardiac catheterization. 2. Diastolic heart failure with elevated LVEDP and pulmonary artery     wedge pressure.  Treated with diuretics. 3. Pulmonary hypertension.  We will follow up with Dr. Marchelle Gearing as an     outpatient. 4. Renal artery stenosis with renal angiogram this admission,     60% left renal artery stenosis undergoing percutaneous transluminal     angioplasty, reducing to 20%. 5. Hypertension. 6. Renal insufficiency, improved at discharge. 7. History of bilateral renal stents. 8. Hypothyroidism. 9. Dyslipidemia. 10.Diabetes mellitus, insulin dependent. 11.Anemia, will be followed as an outpatient.  DISCHARGE CONDITION:  Stable.  PROCEDURES: 1. Combined right and left heart cath by Dr. Nanetta Batty, January 28, 2011. 2. PV renal arteriogram.  Undergoing PTA of the left renal artery     stenosis, reducing 60% stenosis to less than 20%.  DISCHARGE INSTRUCTIONS: 1. Increase activity slowly. 2. May shower. 3. No lifting for 1 week.  No driving for 2 days. 4. Low-sodium, heart-healthy diabetic diet. 5. Wash cath site with soap and water.  Call if any bleeding, swelling     or drainage. 6. Follow up with Dr. Allyson Sabal.  The office will call with the date and     time. 7. Follow up with Dr. Marchelle Gearing of [facility], whose office     should call you.  If you have not heard from Dr. Jane Canary office, please contact  Dr. Hazle Coca office and we will arrange.  DISCHARGE MEDICATIONS:  See medication reconciliation sheet.  We did increase her diuretic from 40 twice a day to 80 twice a day and added low-dose potassium.  Please note on her med rec sheet, it was missing her Imdur and her amlodipine, and these were added back at the discharge, but she was on these at that dosage as an outpatient as well.  HOSPITAL COURSE:  Ms. 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  At that time, she had a 50% mid LAD lesion.  She did have vascular disease with multiple interventions in both renal arteries, most recent prior to this admission was in 2010 where Dr. Allyson Sabal placed stents for in-stent restenosis.  She has subclavian disease as well. Difficult to control hypertension, dyslipidemia, insulin-dependent diabetes mellitus, and hypothyroidism.  She also has chronically elevated CPKs of unclear etiology and has been on prednisone prior to this admission.  Dr. Allyson Sabal saw her in the office August 23.  She had been having increasing episodes of chest pain radiating to both arms as well as progressive  dyspnea, which predated beginning her prednisone.  Dr. Allyson Sabal ordered a nuclear stress test and a 2-D echo.  The nuclear stress test did reveal subtle anterolateral ischemia and the 2-D echo revealed normal LV systolic function with grade 2 diastolic dysfunction and moderate-to-severe concentric LVH.  He brought her in electively for outpatient right and left heart catheterization as well as renal artery evaluation.  Procedure was done without complications.  She has pulmonary hypertension with elevated EDPs and pulmonary artery wedge pressures as well as renal artery stenosis in the left.  She had a 60% stenosis, though where she underwent PTA for this.  She was kept overnight at [facility] for hydration due to her chronic kidney sufficiency and stage III  kidney disease.  In the next morning, she had no further complaints.  She does have a 2/6 systolic ejection murmur, loud S2.  Her lungs were clear.  Blood pressure continued to be elevated at 162/58 and 184/55.  She had low- grade fever of 100 degrees.  She was seen by Dr. Royann Shivers.  Diuretic was increased because of her elevated wedge pressures and she will need a followup basic metabolic panel in 5-6 days.  She was ambulated without complications and ready for discharge home.  LABORATORY VALUES AT DISCHARGE:  Sodium 140, potassium 4.3, chloride 104, CO2 28, glucose 141, BUN 28, creatinine 1.27, calcium 8.4. Hemoglobin was 9.9, hematocrit was 20.2, WBC was 10.9, and platelets 293.     Darcella Gasman. Annie Paras, N.P.   ______________________________ Nanetta Batty, M.D.    LRI/MEDQ  D:  01/29/2011  T:  01/29/2011  Job:  [id number]  cc:   Nanetta Batty, M.D. Angus G. Renard Matter, MD Linward Natal Kalman Shan, MD  Electronically Signed by Nada Boozer N.P. on 01/29/2011 05:32:29 PM Electronically Signed by Nanetta Batty M.D. on 02/20/2011 11:43:23 AM

## 2011-02-20 NOTE — Procedures (Signed)
  NAMEMAIKA, [name] NO.:  [id number]  MEDICAL RECORD NO.:  [id number]  LOCATION:  2923                         FACILITY:  [facility]  PHYSICIAN:  Nanetta Batty, M.D.   DATE OF BIRTH:  October 28, 1945  DATE OF PROCEDURE: DATE OF DISCHARGE:                   PERIPHERAL VASCULAR INVASIVE PROCEDURE   [name] is a 65 year old mild to moderately overweight married Caucasian female with the history of vascular disease status post bilateral renal artery PTA and stenting and re-stenting with iCAST covered stents.  The patient has also had subclavian artery stenting.  She has underwent cath today revealing essentially normal coronary arteries with elevated filling pressures.  Because of her hypertension, she is here to undergo as well.  Selective renal angiography to define her stents and rule out "in-stent restenosis as it caused her hypertension."  Using the existing 5-French JR-4, selective angio was performed of the right and left renal arteries.  The right renal arteries widely patent. The left had a 60% ostial stenosis with a 130-mm pullback gradient. Because of this, we will proceed with PTA of the ostium of the right left renal artery in the event that her hypertension is renovascularly mediated  The patient received 5000 units of heparin intravenously with an ACT of 215.  Using a short 6-French right Judkins guide along with L-1490 stabilizer wire and a 5 x 15 Aviator balloon PTA was performed at the ostium of the right renal artery.  This was dilated to 14 atmospheres (5.25 mm) resulting in reduction of a 60% ostial left renal artery stenosis to less than 20% residual.  The patient tolerated the procedure well.  The guidewire and catheter removed.  Sheath was sewn securely in place.  The patient left the lab in stable condition.  IMPRESSION:  Successful left renal artery percutaneous transluminal angioplasty for "in-stent restenosis and severe systolic  hypertension. The patient's blood pressure did drop with the addition of intravenous hydralazine at end of the case as well as intravenous nitro.  She left the lab in stable condition.     Nanetta Batty, M.D.     Cordelia Pen  D:  01/28/2011  T:  01/28/2011  Job:  [id number]  cc:   Angus G. Renard Matter, MD  Electronically Signed by Nanetta Batty M.D. on 02/20/2011 11:43:29 AM

## 2011-02-20 NOTE — Cardiovascular Report (Signed)
  NAMETAMIAH, DYSART.:  [id number]  MEDICAL RECORD NO.:  [id number]  LOCATION:  2923                         FACILITY:  [facility]  PHYSICIAN:  Nanetta Batty, M.D.   DATE OF BIRTH:  1945/12/20  DATE OF PROCEDURE:  01/28/2011 DATE OF DISCHARGE:                           CARDIAC CATHETERIZATION   Shondrika is a 65 year old mild to moderately overweight married Caucasian female, mother of 3, grandmother of 6 grandchildren who I recently saw in the office.  She has a history of noncritical CAD by cath in April 08, 2009 with minimal mid-LAD lesion.  She does have vascular disease with multiple interventions to both the renal arteries as well as subclavian arteries.  I stented and restented her renal arteries most recently with iCAST, covered stent for "in-stent restenosis.  Problems include dyslipidemia, insulin-dependent diabetes, hypertension, and hypothyroidism.  The patient does have chronically elevated CPKs of unclear etiology and has been on prednisone in the past.  She has recently developed accelerated chest pain, progressive dyspnea which she has had predated beginning her steroid.  She presents now for a right and left heart cath to define her anatomy and physiology.  PROCEDURE DESCRIPTION:  The patient was brought to the second floor of [facility] in postabsorptive state.  She is premedicated with p.o. Valium, IV Versed and fentanyl.  Her right groin was prepped and shaved in the usual sterile fashion.  Xylocaine 1% was used for local anesthesia.  A 6-French sheath was inserted into the right femoral artery using standard Seldinger technique.  A 7-French sheath was inserted into the right femoral vein.  A 7-French balloon-tip thermodilution Swan-Ganz catheter was used to obtain sequential right heart pressures.  A 5-French right and left Judkins diagnostic catheter as well as 5-French pigtail catheter were used for selective  coronary cholangiography obtaining LV pressures.  The left ventriculogram was not performed.  Visipaque dye was used for the entirety of the case. Retrograde aortic, left ventricular, pullback pressures were recorded.  HEMODYNAMIC RESULTS: 1. Aortic systolic pressure 217, diastolic pressure 72. 2. Left ventricular systolic pressure 224, end-diastolic pressure 31. 3. RA pressure A-wave 23, V-wave 18, mean 15. 4. RV pressure systolic 108, end-diastolic pressure 11. 5. Pulmonary artery pressure systolic 110, diastolic pressure 31, mean     67. 6. Pulmonary capillary wedge pressure A-wave 51, V-wave 16, mean 43.  SELECTIVE CORONARY ANGIOGRAPHY: 1. Left main normal. 2. LAD normal. 3. Left circumflex normal. 4. Right coronary was dominant and normal.  IMPRESSION:  Ms. Zobrist has essentially normal coronary arteries and normal LV function by 2-D echo with markedly elevated pulmonary pressures and filling pressures.  She probably has a mixed picture of diastolic dysfunction, hypertensive heart plus or minus an element of pulmonary retention.  I have spoken with Dr. Kalman Shan by seeing her as an outpatient in the office for workup of a pulmonary hypertension, treatment with vasodilators.     Nanetta Batty, M.D.     Cordelia Pen  D:  01/28/2011  T:  01/28/2011  Job:  [id number]  cc:   Angus G. Renard Matter, MD Angelina Ok  Electronically Signed by Nanetta Batty M.D. on 02/20/2011 11:43:26 AM

## 2011-02-28 LAB — BLOOD GAS, ARTERIAL
Bicarbonate: 24.2 — ABNORMAL HIGH
TCO2: 21.4
pCO2 arterial: 35.9
pH, Arterial: 7.444 — ABNORMAL HIGH

## 2011-02-28 LAB — COMPREHENSIVE METABOLIC PANEL
ALT: 22
Albumin: 3.3 — ABNORMAL LOW
Alkaline Phosphatase: 31 — ABNORMAL LOW
BUN: 50 — ABNORMAL HIGH
Chloride: 99
Glucose, Bld: 300 — ABNORMAL HIGH
Potassium: 3.2 — ABNORMAL LOW
Total Bilirubin: 0.9

## 2011-02-28 LAB — DIFFERENTIAL
Basophils Absolute: 0.1
Basophils Relative: 1
Eosinophils Absolute: 0.2
Monocytes Absolute: 0.7
Neutro Abs: 5.6

## 2011-02-28 LAB — BASIC METABOLIC PANEL
BUN: 63 — ABNORMAL HIGH
CO2: 27
Calcium: 9.5
Chloride: 101
Creatinine, Ser: 2.5 — ABNORMAL HIGH
Glucose, Bld: 98

## 2011-02-28 LAB — URINALYSIS, ROUTINE W REFLEX MICROSCOPIC
Bilirubin Urine: NEGATIVE
Hgb urine dipstick: NEGATIVE
Ketones, ur: NEGATIVE
Protein, ur: NEGATIVE
Urobilinogen, UA: 0.2

## 2011-02-28 LAB — CBC
HCT: 39.7
Hemoglobin: 13.3
Platelets: 306
WBC: 8.6

## 2011-03-01 ENCOUNTER — Telehealth: Payer: Self-pay | Admitting: Internal Medicine

## 2011-03-01 DIAGNOSIS — I272 Pulmonary hypertension, unspecified: Secondary | ICD-10-CM

## 2011-03-01 DIAGNOSIS — R0600 Dyspnea, unspecified: Secondary | ICD-10-CM

## 2011-03-01 NOTE — Telephone Encounter (Signed)
REviewed Dr Eligah East notes from [facility]. Dr Kaleen Mask neuro notes were not sent. In June 2011 ESR 46, ANA 1:40 weakly positive, CK  838, aldolase 16.7 and high (normal < 7.6), anti JO1 negative. Based on above there is strong suggestion for autoimmune disease. Please have her come in ahead of my visit byt atleast 3-5 days and do the following blood tests  Autoimmune  Profile - ana, esr, crp, anca, ds-dna, complement c3 and c4, aldolase, totalk ck, ssA, ssB, rheumatoid factor, anti ccP, scl-70, anti jo1, anti-mitochondrial antibody, LFT, and HIV antibody. Also check: anti-Sm, or anti-ribonucleoprotein (RNP) antibodies.  Cannot remember if I checked HIV last visit. No need for LFT - they were normal Sept 2011 at [facility]

## 2011-03-02 NOTE — Telephone Encounter (Addendum)
I spoke with the patient and advised that there were additional labs needed before her upcoming appt. The pt states she is broke and feels like she has already had all of these labs at [facility]. She states she thinks her neuromuscular doctor, Dr. Hyman Hopes at [facility] ran all of the test and she request that we get the records from them. I advised that I will discuss this with Dr. Marchelle Gearing and call her back.

## 2011-03-07 ENCOUNTER — Telehealth: Payer: Self-pay | Admitting: *Deleted

## 2011-03-07 NOTE — Telephone Encounter (Signed)
MR email as follows: Hold off on tests. I will see her monday - do not change appt. Let us chase the neuromuscular records. Thanks for thinking ahead and thinking of patient. I called the pt and advised of this. She will keep appt on Weds. Carron Curie, CMA

## 2011-03-07 NOTE — Telephone Encounter (Signed)
I called the pt to discuss upcoming appt and she c/o having sores in her mouth and on her tongue x 2 days. She is requesting and rx for this. Please advise. [name], CMA Allergies  Allergen Reactions  . Amlodipine Besylate   . Atenolol   . Bystolic (Nebivolol Hcl)     Cannot tolerate greater then 5 mg, causes chest pain  . Ciprofloxacin   . Codeine   . Lidocaine   . Metformin   . Metoprolol   . Penicillins

## 2011-03-08 NOTE — Telephone Encounter (Signed)
Spoke with patient-states that she is not having white patchy areas on the tongue and will wait for appt with MR tomorrow to have MR look at her tongue.

## 2011-03-08 NOTE — Telephone Encounter (Signed)
I have no idea what this is without looking at it. ARe there white spots ? If so, it is thrush and you can send nystatin swish and swallow 5 times daily for 5 days. If she can wait, I would prefer to see her mouth myuself when I see her this week.

## 2011-03-09 ENCOUNTER — Ambulatory Visit (INDEPENDENT_AMBULATORY_CARE_PROVIDER_SITE_OTHER): Payer: BC Managed Care – PPO | Admitting: Internal Medicine

## 2011-03-09 ENCOUNTER — Telehealth: Payer: Self-pay | Admitting: Internal Medicine

## 2011-03-09 ENCOUNTER — Encounter: Payer: Self-pay | Admitting: Internal Medicine

## 2011-03-09 VITALS — BP 130/70 | HR 56 | Temp 98.3°F | Ht 65.0 in | Wt 191.0 lb

## 2011-03-09 DIAGNOSIS — I2789 Other specified pulmonary heart diseases: Secondary | ICD-10-CM

## 2011-03-09 DIAGNOSIS — I272 Pulmonary hypertension, unspecified: Secondary | ICD-10-CM

## 2011-03-09 DIAGNOSIS — J029 Acute pharyngitis, unspecified: Secondary | ICD-10-CM | POA: Insufficient documentation

## 2011-03-09 DIAGNOSIS — K140 Glossitis: Secondary | ICD-10-CM | POA: Insufficient documentation

## 2011-03-09 MED ORDER — DOXYCYCLINE HYCLATE 100 MG PO TABS: 100.0000 mg | ORAL_TABLET | Freq: Two times a day (BID) | ORAL | Status: AC

## 2011-03-09 NOTE — Assessment & Plan Note (Signed)
Data suggest mixed pulmonary hypertension. The high wedge pressure has been treated with lasix but she is only minimally better. Still with class 3 dyspnea. Pulm htn is severe. Asssocitaed high CK, high ESR, high aldolase and positive ANA in June 2012 strongly favor co-existent autoimmune disease. Further testing for other autoimmune antibodies warranted but prednisone could interefere with its positivity. Therefore (due to likely autoimmune dz) I think she meets indications for advancd [facility] Rx. Will get her on letairis for class 3 dyspnea. Will also have her repeat sleep study and cut down lasix per her request (dr Allyson Sabal informed)

## 2011-03-09 NOTE — Patient Instructions (Addendum)
#  Pulmonary hypertension - Might be due to neuromuscular disease - we need records from Dr Jennye Boroughs at [facility]. Based on review might need additional labs  - Start LETAIRIS 5mg  daily - meet with our coordinator for this; need insurance approval for this  - We need repeat sleep study through Dr Hazle Coca office - Nurse will walk you for oxygen levels - if low, need to start oxygen treatment #Tongue sore  - please see Dr Andrey Campanile in [place], [place] and consider biopsy #Sore throat/Pharyngitis - STart doxycycline 100mg  twice daily after meals x 5 days; if nauseated call us #Followup  - 4-6 weeks - have flu shot through primary care office #Letters will be sent  - DR Eligah East, Dr Andrey Campanile. Dr Allyson Sabal, Primary care, Dr Jennye Boroughs - give my nurse their details

## 2011-03-09 NOTE — Assessment & Plan Note (Signed)
?   Traumatic, ? Reflective of auto immune process. I have recommended she get a biopsy through Dr Andrey Campanile.

## 2011-03-09 NOTE — Assessment & Plan Note (Signed)
Mild pharyngitis currently  Plan Short course doxy

## 2011-03-09 NOTE — Telephone Encounter (Signed)
[name]  Please ensure that notes went to Dr Andrey Campanile at [place], [place]. I would like to talk to him. I think patient needs bx of tongue but need to ttalk to him first. Can you get hold of him please.  Thanks  MR

## 2011-03-09 NOTE — Progress Notes (Signed)
Subjective:    Patient ID: [name], female    DOB: 07-20-1945, 65 y.o.   MRN: [id number]  HPI 65 year old female. Ex-smoker. 1 pack x 25 years. Quit 2005. Chronic Kidney Disease. Hypertension. Diabetes. Hyperlipidemia. Obesity. 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 Referred by Dr Allyson Sabal for dyspnea  Reports dyspnea of insidious onset present for 3-5 years. Brought on by exertion for activities like < 1/4 block, 9 steps, or changing clothes. Relieved by rest and recent lasix started by Dr Allyson Sabal after cath last week.  Gradually progressive but in past 6 weeks rapidly worse. Rates current dyspnea as severe. Dyspnea associated for same duration with severe central chest pain,  nausea, sweating, tingling and dizziness.   Recollects extensive workup for past few years for dyspnea (not diagnosed with pulm htn during this workup) including VQ scan 2010 -neg for PE, 2009 PFT at [facility] - normal per echart except mild reduction in dlco. Left heart cath in 2009 left heart cath that was normal except for 50% LAD proximal stenosis. ECHO Nov 2010 (echart): ef 55% with LVH but otherwise normal including RV. CT abdomen lung cut in 2006 - no evidence of pulmonary fibrosis. Reportedly has sleep study in in 2011 and was advised o2 without cpap. Then on 01/28/11 underwent Left renal artery stent for restenosis and poorly controlled sbp. This time she underwent right and left heart cath on 01/28/11 (results below) and referred here  1. Aortic systolic pressure 217, diastolic pressure 72.   2. Left ventricular systolic pressure 224, end-diastolic pressure 31.   3. RA pressure A-wave 23, V-wave 18, mean 15.   4. RV pressure systolic 108, end-diastolic pressure 11.   5. Pulmonary artery pressure systolic 110, diastolic pressure 31, mean 67.   6. Pulmonary capillary wedge pressure A-wave 51, V-wave 16, mean 43. Transpulm gradient 24     SELECTIVE  CORONARY ANGIOGRAPHY:   1. Left main normal.   2. LAD normal.   3. Left circumflex normal.   4. Right coronary was dominant and normal.     Of note, she is following with Dr Eligah East endocrine at [facility]  And another neuromuscular MD since Dec 2011 for high CPK 1800 and hyperlipidemia. 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 Since then weight gain 20# since starting steroids to 180#. Denies weight loss drugs intake. Denies coccaine intake, marijuana. Denies HIV positive status. Denies PE or DVT in past.   REC -Please get vq scan for chronic pe  -Please get doppler legs for dvt evaluation  -Please get full PFTs  -Plesae do 6 min walk test  -Please sign release to get records from Dr Eligah East at [facility] and Dr Hyman Hopes at [facility]; we need these records asap  -Need sleep study results from Dr Allyson Sabal  (per hx done 3 years ago prior to weight gain and normal)- Return to see me in < 2 weeks after above  OV 03/09/11: Followup pulmonary hypertension evaluation. She is here to review results and decide on Rx plan but she has new and additional complaints. States that Since dec 2011 versus  being on steroids since July 2012 has oral sores on and off. Past 6 weeks tongue rt lateral border sore. Saw ENT Dr Andrey Campanile in [place], [place] 6 weeks ago for grommet removal. 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 Still complaining of sore tongue and mouth. Past 3 days, cold, cough, brown sputum and lying in bed. Not getting better. Feels she might need antibiotics. IN terms of chronic  Dyspnea unchanged class 3. Walking hallway at home or doing light laundry. Feels like she is having MI. Lasix trial only minimal help. She wants to cut down on lasix dose.   In terms of review so far: I reviewed Dr Eligah East notes from [facility]. Dr Kaleen Mask the geneticist and Dr Hyman Hopes neuromuscular  notes were not sent. In May 2011 EMG  showed proximal muscular weakness with CK 600-800 range. Did not improved with statin discontinuation. In dec 2011 - muscle bx showed inflammatory changes. In June 2011 ESR 46, ANA 1:40 weakly positive, CK 838, aldolase 16.7 and high (normal < 7.6), anti JO1 negative. Then in July 2012 she was started on prednisone 40mg  daily.   Tests done under [facility] reveal:  01/29/11: Autoimmune profile at [facility]: ANA, Jo1, RF and scl-70 - negative  (done while on prednisone) 9/20/2 VQ scan and LE dopplers: Isolated low dlco 40% and negative for dvt 02/15/11: walk test: baseline pulse ox 99%, end of ex 92%, 321 meters, had chest pain  Review of Systems  Constitutional: Negative for fever and unexpected weight change.  HENT: Positive for ear pain and sore throat. Negative for nosebleeds, congestion, rhinorrhea, sneezing, trouble swallowing, dental problem, postnasal drip and sinus pressure.   Eyes: Negative for redness and itching.  Respiratory: Positive for cough and shortness of breath. Negative for chest tightness and wheezing.   Cardiovascular: Negative for palpitations and leg swelling.  Gastrointestinal: Negative for nausea and vomiting.  Genitourinary: Negative for dysuria.  Musculoskeletal: Negative for joint swelling.  Skin: Negative for rash.  Neurological: Negative for headaches.  Hematological: Does not bruise/bleed easily.  Psychiatric/Behavioral: Negative for dysphoric mood. The patient is not nervous/anxious.        Objective:   Physical Exam  Vitals reviewed. Constitutional: She is oriented to person, place, and time. She appears well-developed and well-nourished. No distress.       Obese Cushingoid  HENT:  Head: Normocephalic and atraumatic.  Right Ear: External ear normal.  Left Ear: External ear normal.  Mouth/Throat: CHRONIC LOOKING ULCER 2cm in LATERAL BORDER OF MID RIGHT TONGUE Eyes: Conjunctivae and EOM are normal. Pupils are equal, round, and reactive to light. Right eye  exhibits no discharge. Left eye exhibits no discharge. No scleral icterus.  Neck: Normal range of motion. Neck supple. No JVD present. No tracheal deviation present. No thyromegaly present.  Cardiovascular: Normal rate, regular rhythm and intact distal pulses.  Exam reveals no gallop and no friction rub.   Murmur heard.      Loud P2 Pulmonary ejection systolic murmur +  Pulmonary/Chest: Effort normal and breath sounds normal. No respiratory distress. She has no wheezes. She has no rales. She exhibits no tenderness.  Abdominal: Soft. Bowel sounds are normal. She exhibits no distension and no mass. There is no tenderness. There is no rebound and no guarding.  Musculoskeletal: Normal range of motion. She exhibits no edema and no tenderness.  Lymphadenopathy:    She has no cervical adenopathy.  Neurological: She is alert and oriented to person, place, and time. She has normal reflexes. No cranial nerve deficit. She exhibits normal muscle tone. Coordination normal.  Skin: Skin is warm and dry. No rash noted. She is not diaphoretic. No erythema. No pallor.  Psychiatric: Flat affect today  Assessment & Plan:

## 2011-03-10 NOTE — Telephone Encounter (Signed)
I did not call the pt so I called her to see what was needed. She states she spoke with Dr. Andrey Campanile and he is rec that she have a sleep study. She states she is confused because at OV with MR he rec that Dr. Allyson Sabal order a sleep study. I spoke with MR and he states that for the purpose of continuity of care she should have Dr. Allyson Sabal order the sleep study because he ordered the first sleep study. He states it is ultimately the pt decision who she has order the sleep study. HTe pt states she will have Dr. Allyson Sabal order the sleep study. I advised her that we have placed a call to Dr. Andrey Campanile so MR can discuss the possible need for a tongue biopsy.  I LMTCB with the Dr. Andrey Campanile nurse. Carron Curie, CMA

## 2011-03-10 NOTE — Telephone Encounter (Signed)
Patient returning call.

## 2011-03-14 ENCOUNTER — Ambulatory Visit: Payer: BC Managed Care – PPO | Admitting: Pulmonary Disease

## 2011-04-11 ENCOUNTER — Telehealth: Payer: Self-pay | Admitting: Internal Medicine

## 2011-04-11 NOTE — Telephone Encounter (Signed)
I spoke with pt and made her aware MR has left for the day. She states that was fine just wanted the okay before the procedure. I will forward to MR so he is aware of what pt is having done

## 2011-04-11 NOTE — Telephone Encounter (Signed)
lmomtcb  

## 2011-04-11 NOTE — Telephone Encounter (Signed)
Pt is going to have tubes placed in her ears tomorrow at 10 am and is going to have general anesthesia. Pt is wanting to know if Dr. Marchelle Gearing feels this is okay. Pt wants a call back tonight. Please advise, thanks

## 2011-04-12 NOTE — Telephone Encounter (Signed)
Has she started bosentan or letairis ?  When is surgery ?  Who is ENT ?  I need to talk to surgery or anesthesia .  Pulmonary hypertension is bad and I hope they are aware of it. I think there is a fairly high risk

## 2011-04-12 NOTE — Telephone Encounter (Signed)
D/w Dr Andrey Campanile ENT in [place], [place]  Informed him about severe pulmponary hypertension. Explained that risk with moderate sedation fentanyl/versed due to obesity and OSA is desaturation and resp arrest. Risk with GA is cardiovascular collapse. Risk for biopsy is bleeding. Explained risk is high. Preferred advise is to local anesthesia in monitored setting. Explained that very low dose of fentanyl, or versed can be tried and wait and see response but impossible to predict how patient will react. Explained if intubated might be difficult to get patient of ventilator and shock/cvs collapse are likely possibilities  He will try to do procedure under local anesthesia only

## 2011-04-12 NOTE — Telephone Encounter (Signed)
Please get hold of surgeon or patient for me ASAP

## 2011-04-12 NOTE — Telephone Encounter (Signed)
This is a duplicate msg

## 2011-04-12 NOTE — Telephone Encounter (Signed)
I called the pt ENT Dr. Andrey Campanile office and spoke with Huntley Dec and advised that Dr. Marchelle Gearing wants to speak to the doctor before the surgery is started because he feels there is a risk for the patient. I was on hold for 10 minute while the office tried to contact the doctor. I gave them Dr. Marchelle Gearing cell phone number and they state they will have Dr. Andrey Campanile call Dr. Marchelle Gearing ASAP before the procedure is started. Carron Curie, CMA

## 2011-04-18 ENCOUNTER — Ambulatory Visit: Payer: BC Managed Care – PPO | Admitting: Internal Medicine

## 2011-04-19 NOTE — Telephone Encounter (Signed)
Since this  Dr Andrey Campanile says no need for tngue bx. He thinks is related to bruxism.   Dr Andrey Campanile did procedure on patient and i discussed with him  Preoperatively  I will close note  Ensure fu with me per prior ov Ensure patiet on anti pulm htn table

## 2011-04-20 ENCOUNTER — Ambulatory Visit: Payer: BC Managed Care – PPO | Admitting: Internal Medicine

## 2011-04-20 NOTE — Telephone Encounter (Signed)
LMTCBx1 to see if pt started Letaris yet?Carron Curie, CMA

## 2011-04-21 ENCOUNTER — Telehealth: Payer: Self-pay | Admitting: Internal Medicine

## 2011-04-21 NOTE — Telephone Encounter (Signed)
I spoke with the pt and she states she has not heard anything about Letaris. So I called [employer] and she is checking into it and will call me back. Carron Curie, CMA

## 2011-04-21 NOTE — Telephone Encounter (Signed)
Candise Bowens, did you call this pt? I can not see where any results pending. Please advise thanks

## 2011-04-21 NOTE — Telephone Encounter (Signed)
See 03-09-11 phone note. Carron Curie, CMA

## 2011-04-25 ENCOUNTER — Telehealth: Payer: Self-pay | Admitting: Internal Medicine

## 2011-04-25 NOTE — Telephone Encounter (Signed)
Spoke with pt. She is requesting results of PSG. I advised that looks like we did not order this for her. She states that Dr. Allyson Sabal ordered the test and so I advised that she call Dr Allyson Sabal to obtain these results. Pt verbalized understanding.

## 2011-04-25 NOTE — Telephone Encounter (Signed)
Per [employer] Free the company stated they did not receive there referral for letaris for this patient. She has re-faxed all information. Carron Curie, CMA

## 2011-04-27 ENCOUNTER — Other Ambulatory Visit: Payer: Self-pay | Admitting: Internal Medicine

## 2011-04-29 ENCOUNTER — Ambulatory Visit (INDEPENDENT_AMBULATORY_CARE_PROVIDER_SITE_OTHER): Payer: BC Managed Care – PPO | Admitting: Internal Medicine

## 2011-04-29 ENCOUNTER — Encounter: Payer: Self-pay | Admitting: Internal Medicine

## 2011-04-29 VITALS — BP 144/62 | HR 80 | Temp 97.7°F | Ht 65.0 in | Wt 195.6 lb

## 2011-04-29 DIAGNOSIS — I272 Pulmonary hypertension, unspecified: Secondary | ICD-10-CM

## 2011-04-29 DIAGNOSIS — I2789 Other specified pulmonary heart diseases: Secondary | ICD-10-CM

## 2011-04-29 NOTE — Progress Notes (Signed)
Subjective:    Patient ID: [name], female    DOB: 08/15/1945, 65 y.o.   MRN: [id number]  HPI 65 year old female. Ex-smoker. 1 pack x 25 years. Quit 2005. Chronic Kidney Disease. Hypertension. Diabetes. Hyperlipidemia. Obesity. 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 Referred by Dr [name] for dyspnea  Reports dyspnea of insidious onset present for 3-5 years. Brought on by exertion for activities like < 1/4 block, 9 steps, or changing clothes. Relieved by rest and recent lasix started by Dr [name] after cath last week.  Gradually progressive but in past 6 weeks rapidly worse. Rates current dyspnea as severe. Dyspnea associated for same duration with severe central chest pain,  nausea, sweating, tingling and dizziness.   Recollects extensive workup for past few years for dyspnea (not diagnosed with pulm htn during this workup) including VQ scan 2010 -neg for PE, 2009 PFT at [facility] - normal per echart except mild reduction in dlco. Left heart cath in 2009 left heart cath that was normal except for 50% LAD proximal stenosis. ECHO Nov 2010 (echart): ef 55% with LVH but otherwise normal including RV. CT abdomen lung cut in 2006 - no evidence of pulmonary fibrosis. Reportedly has sleep study in in 2011 and was advised o2 without cpap. Then on 01/28/11 underwent Left renal artery stent for restenosis and poorly controlled sbp. This time she underwent right and left heart cath on 01/28/11 (results below) and referred here  1. Aortic systolic pressure 217, diastolic pressure 72.   2. Left ventricular systolic pressure 224, end-diastolic pressure 31.   3. RA pressure A-wave 23, V-wave 18, mean 15.   4. RV pressure systolic 108, end-diastolic pressure 11.   5. Pulmonary artery pressure systolic 110, diastolic pressure 31, mean 67.   6. Pulmonary capillary wedge pressure A-wave 51, V-wave 16, mean 43. Transpulm gradient 24     SELECTIVE  CORONARY ANGIOGRAPHY:   1. Left main normal.   2. LAD normal.   3. Left circumflex normal.   4. Right coronary was dominant and normal.     Of note, she is following with Dr [name] endocrine at [facility]  And another neuromuscular MD since Dec 2011 for high CPK 1800 and hyperlipidemia. 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 Since then weight gain 20# since starting steroids to 180#. Denies weight loss drugs intake. Denies coccaine intake, marijuana. Denies HIV positive status. Denies PE or DVT in past.   REC -Please get vq scan for chronic pe  -Please get doppler legs for dvt evaluation  -Please get full PFTs  -Plesae do 6 min walk test  -Please sign release to get records from Dr [name] at [facility] and Dr [name] at [facility]; we need these records asap  -Need sleep study results from Dr [name]  (per hx done 3 years ago prior to weight gain and normal)- Return to see me in < 2 weeks after above  OV 03/09/11: Followup pulmonary hypertension evaluation. She is here to review results and decide on Rx plan but she has new and additional complaints. States that Since dec 2011 versus  being on steroids since July 2012 has oral sores on and off. Past 6 weeks tongue rt lateral border sore. Saw ENT Dr [name] in [place], [place] 6 weeks ago for grommet removal. 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 Still complaining of sore tongue and mouth. Past 3 days, cold, cough, brown sputum and lying in bed. Not getting better. Feels she might need antibiotics. IN terms of chronic  Dyspnea unchanged class 3. Walking hallway at home or doing light laundry. Feels like she is having MI. Lasix trial only minimal help. She wants to cut down on lasix dose.   In terms of review so far: I reviewed Dr [name] notes from [facility]. Dr [name] the geneticist and Dr [name] neuromuscular  notes were not sent. In May 2011 EMG  showed proximal muscular weakness with CK 600-800 range. Did not improved with statin discontinuation. In dec 2011 - muscle bx showed inflammatory changes. In June 2011 ESR 46, ANA 1:40 weakly positive, CK 838, aldolase 16.7 and high (normal < 7.6), anti JO1 negative. Then in July 2012 she was started on prednisone 40mg  daily.   Tests done under [facility] reveal:  01/29/11: Auto[facility] profile at [facility]: ANA, Jo1, RF and scl-70 - negative  (done while on prednisone) 9/20/2 VQ scan and LE dopplers: Isolated low dlco 40% and negative for dvt 02/15/11: walk test: baseline pulse ox 99%, end of ex 92%, 321 meters, had chest pain   #Pulmonary hypertension  - Might be due to autoimmune v  neuromuscular disease - we need records from Dr [name] at [facility]. Based on review might need additional labs  - Start LETAIRIS 5mg  daily - meet with our coordinator for this; need insurance approval for this  - We need repeat sleep study through Dr [name] office  - Nurse will walk you for oxygen levels - if low, need to start oxygen treatment  #Tongue sore  - please see Dr [name] in [place], [place] and consider biopsy  #Sore throat/Pharyngitis  - STart doxycycline 100mg  twice daily after meals x 5 days; if nauseated call us  #Followup  - 4-6 weeks  - have flu shot through primary care office  #Letters will be sent  - DR [name], Dr [name]. Dr [name], Primary care, Dr [name] - give my nurse their details   OV 04/29/2011. Folllowup pulmonary hypertension. Yet to start letairis. Apparently our coordinator faxed the form but recipient never got it. She is now on CPAP but having difficulty tolerating it. 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 She had grommet in ear under local anesthesia. She is upset that I did not clear her for GA or sedation. I explained that she basically called at last second to inform us abour procedure and we never had time to do a preop eval and that with PASP of 100 that GA is risky. Still with  class 3-4 dyspnea and chest pain c/w pulmonary hypertesion  No other changes to past, social, family    Review of Systems  Constitutional: Negative for fever and unexpected weight change.  HENT: Negative for ear pain, nosebleeds, congestion, sore throat, rhinorrhea, sneezing, trouble swallowing, dental problem, postnasal drip and sinus pressure.   Eyes: Negative for redness and itching.  Respiratory: Negative for cough, chest tightness, shortness of breath and wheezing.   Cardiovascular: Negative for palpitations and leg swelling.  Gastrointestinal: Negative for nausea and vomiting.  Genitourinary: Negative for dysuria.  Musculoskeletal: Negative for joint swelling.  Skin: Negative for rash.  Neurological: Negative for headaches.  Hematological: Does not bruise/bleed easily.  Psychiatric/Behavioral: Negative for dysphoric mood. The patient is not nervous/anxious.        Objective:   Physical Exam Vitals reviewed.  Constitutional: She is oriented to person, place, and time. She appears well-developed and well-nourished. No distress.       Obese Cushingoid  HENT:  Head: Normocephalic and atraumatic.  Right Ear: External ear normal.  Left Ear: External ear normal.  Mouth/Throat: CHRONIC LOOKING ULCER 2cm in LATERAL BORDER OF MID RIGHT TONGUE Eyes: Conjunctivae and EOM are normal. Pupils are equal, round, and reactive to light. Right eye exhibits no discharge. Left eye exhibits no discharge. No scleral icterus.  Neck: Normal range of motion. Neck supple. No JVD present. No tracheal deviation present. No thyromegaly present.  Cardiovascular: Normal rate, regular rhythm and intact distal pulses.  Exam reveals no gallop and no friction rub.   Murmur heard.      Loud P2 Pulmonary ejection systolic murmur +  Pulmonary/Chest: Effort normal and breath sounds normal. No respiratory distress. She has no wheezes. She has no rales. She exhibits no tenderness.  Abdominal: Soft. Bowel sounds  are normal. She exhibits no distension and no mass. There is no tenderness. There is no rebound and no guarding.  Musculoskeletal: Normal range of motion. She exhibits no edema and no tenderness.  Lymphadenopathy:    She has no cervical adenopathy.  Neurological: She is alert and oriented to person, place, and time. She has normal reflexes. No cranial nerve deficit. She exhibits normal muscle tone. Coordination normal.  Skin: Skin is warm and dry. No rash noted. She is not diaphoretic. No erythema. No pallor.  Psychiatric: Flat affect today        Assessment & Plan:

## 2011-04-29 NOTE — Patient Instructions (Signed)
Please meet with my coordinator about when and how your letairis will be approved If this is not, then I will reffer you to [facility] Dr Tapson or Dr Fortin Continue with cpap usage Any procedures, please have the doctors call us early for pre-operative clerance Return in 1 month  

## 2011-05-02 ENCOUNTER — Telehealth: Payer: Self-pay | Admitting: Internal Medicine

## 2011-05-02 DIAGNOSIS — I272 Pulmonary hypertension, unspecified: Secondary | ICD-10-CM

## 2011-05-02 NOTE — Assessment & Plan Note (Signed)
Please meet with my coordinator about when and how your letairis will be approved If this is not, then I will reffer you to [facility] Dr Bland Span or Dr Monia Pouch Continue with cpap usage Any procedures, please have the doctors call us early for pre-operative clerance Return in 1 month

## 2011-05-02 NOTE — Telephone Encounter (Signed)
Jen  Please work with [facility] and keep track of the letairis approval  Thanks  MR

## 2011-05-04 NOTE — Telephone Encounter (Signed)
Patient calling to check on status of letairis order.

## 2011-05-04 NOTE — Telephone Encounter (Signed)
[name] was approved through the life of the plan. [id number] to advise the pt.[name] Yancey Flemings, CMA

## 2011-05-05 NOTE — Telephone Encounter (Signed)
Spoke to patient. She said she saw Dr Eligah East yesterday for lipid and he recommended being seen at [facility]. I have told her that is fine with me. THat was our step 2 plan anyways. SHe will d.w him about having dual fu  v Dr Bland Span alone.   Please make referral to Dr Landis Gandy at [facility] htn [facility]  Also, I called that number for Dr Eligah East and was on hold for > 20 minutes. Had to hang up. Please call that number and you can give Dr Eligah East my cell or if I have his direct number I can call him or if they get him when you call you can connext me to him this pm  THanks  MR

## 2011-05-05 NOTE — Telephone Encounter (Signed)
Pt is aware of Letairis approval, however, she says that her endocrinologist, Dr. Hulda Marin @ [facility], wants her to wait on starting this medication and is wanting to refer her to a specialist for [facility] @ [facility] first. Pt says Dr.Guyton can be reached at [phone]. Pls advise.

## 2011-05-05 NOTE — Telephone Encounter (Signed)
Order sent to [facility] for referral   ATC Dr. Lillard Anes office at [phone] and had to leave message with Shelly Flatten for her to call me back.   Will hold this message in triage.

## 2011-05-06 NOTE — Telephone Encounter (Signed)
LMOM for [name] to call back so we can get a number to reach Dr. Eligah East or give Dr. Eligah East MR's cell #.

## 2011-05-09 ENCOUNTER — Telehealth: Payer: Self-pay | Admitting: Internal Medicine

## 2011-05-09 NOTE — Telephone Encounter (Signed)
lmomtcb x1 

## 2011-05-09 NOTE — Telephone Encounter (Signed)
Called spoke with patient who stated that she did not call about her appts with [facility].  Pt did ask the name of the medication MR wants her to begin and how to spell it.  Letairis 5mg .  Pt informed me that her copay will $60 and asked if her physician at [facility] called her insurance company, if that would drop the copay.  I advised pt that only her insurance would be able to determine if the copay could be decreased.  Pt okay with this and verbalized her understanding.  Nothing further needed.

## 2011-05-09 NOTE — Telephone Encounter (Signed)
Pt returning call.  Pt can be reached at [phone].  [name]

## 2011-05-10 NOTE — Telephone Encounter (Signed)
Dr. Doran Stabler # is [phone]. Please advise Dr. Marchelle Gearing, thanks

## 2011-05-10 NOTE — Telephone Encounter (Signed)
He called and we spoke yesterday. Thanks, MR

## 2011-06-20 ENCOUNTER — Ambulatory Visit: Payer: BC Managed Care – PPO | Admitting: Internal Medicine

## 2011-07-05 ENCOUNTER — Other Ambulatory Visit (HOSPITAL_COMMUNITY): Payer: Self-pay | Admitting: Family Medicine

## 2011-07-05 DIAGNOSIS — Z139 Encounter for screening, unspecified: Secondary | ICD-10-CM

## 2011-07-08 ENCOUNTER — Ambulatory Visit (HOSPITAL_COMMUNITY)
Admission: RE | Admit: 2011-07-08 | Discharge: 2011-07-08 | Disposition: A | Discharge status: Home / Self Care | Payer: BC Managed Care – PPO | Source: Ambulatory Visit | Attending: Family Medicine | Admitting: Family Medicine

## 2011-07-08 DIAGNOSIS — Z139 Encounter for screening, unspecified: Secondary | ICD-10-CM

## 2011-07-08 DIAGNOSIS — Z1231 Encounter for screening mammogram for malignant neoplasm of breast: Secondary | ICD-10-CM | POA: Insufficient documentation

## 2011-08-24 ENCOUNTER — Other Ambulatory Visit: Payer: Self-pay | Admitting: Adult Health

## 2011-08-24 ENCOUNTER — Other Ambulatory Visit (HOSPITAL_COMMUNITY)
Admission: RE | Admit: 2011-08-24 | Discharge: 2011-08-24 | Disposition: A | Discharge status: Home / Self Care | Payer: BC Managed Care – PPO | Source: Ambulatory Visit | Attending: Obstetrics and Gynecology | Admitting: Obstetrics and Gynecology

## 2011-08-24 DIAGNOSIS — Z1159 Encounter for screening for other viral diseases: Secondary | ICD-10-CM | POA: Insufficient documentation

## 2011-08-24 DIAGNOSIS — Z01419 Encounter for gynecological examination (general) (routine) without abnormal findings: Secondary | ICD-10-CM | POA: Insufficient documentation

## 2011-08-24 DIAGNOSIS — R599 Enlarged lymph nodes, unspecified: Secondary | ICD-10-CM

## 2011-09-07 ENCOUNTER — Ambulatory Visit (HOSPITAL_COMMUNITY)
Admission: RE | Admit: 2011-09-07 | Discharge: 2011-09-07 | Disposition: A | Discharge status: Home / Self Care | Payer: BC Managed Care – PPO | Source: Ambulatory Visit | Attending: Adult Health | Admitting: Adult Health

## 2011-09-07 DIAGNOSIS — M7989 Other specified soft tissue disorders: Secondary | ICD-10-CM | POA: Insufficient documentation

## 2011-09-07 DIAGNOSIS — R599 Enlarged lymph nodes, unspecified: Secondary | ICD-10-CM | POA: Insufficient documentation

## 2011-09-27 ENCOUNTER — Encounter (HOSPITAL_COMMUNITY): Payer: Self-pay

## 2011-09-27 ENCOUNTER — Emergency Department (HOSPITAL_COMMUNITY): Payer: BC Managed Care – PPO

## 2011-09-27 ENCOUNTER — Emergency Department (HOSPITAL_COMMUNITY)
Admission: EM | Admit: 2011-09-27 | Discharge: 2011-09-27 | Disposition: A | Discharge status: Home / Self Care | Payer: BC Managed Care – PPO | Attending: Emergency Medicine | Admitting: Emergency Medicine

## 2011-09-27 DIAGNOSIS — Z79899 Other long term (current) drug therapy: Secondary | ICD-10-CM | POA: Insufficient documentation

## 2011-09-27 DIAGNOSIS — F411 Generalized anxiety disorder: Secondary | ICD-10-CM | POA: Insufficient documentation

## 2011-09-27 DIAGNOSIS — R29898 Other symptoms and signs involving the musculoskeletal system: Secondary | ICD-10-CM | POA: Insufficient documentation

## 2011-09-27 DIAGNOSIS — E789 Disorder of lipoprotein metabolism, unspecified: Secondary | ICD-10-CM | POA: Insufficient documentation

## 2011-09-27 DIAGNOSIS — Z794 Long term (current) use of insulin: Secondary | ICD-10-CM | POA: Insufficient documentation

## 2011-09-27 DIAGNOSIS — I252 Old myocardial infarction: Secondary | ICD-10-CM | POA: Insufficient documentation

## 2011-09-27 DIAGNOSIS — I10 Essential (primary) hypertension: Secondary | ICD-10-CM | POA: Insufficient documentation

## 2011-09-27 DIAGNOSIS — I509 Heart failure, unspecified: Secondary | ICD-10-CM | POA: Insufficient documentation

## 2011-09-27 DIAGNOSIS — R531 Weakness: Secondary | ICD-10-CM

## 2011-09-27 DIAGNOSIS — E119 Type 2 diabetes mellitus without complications: Secondary | ICD-10-CM | POA: Insufficient documentation

## 2011-09-27 DIAGNOSIS — R5381 Other malaise: Secondary | ICD-10-CM | POA: Insufficient documentation

## 2011-09-27 DIAGNOSIS — M3320 Polymyositis, organ involvement unspecified: Secondary | ICD-10-CM | POA: Insufficient documentation

## 2011-09-27 DIAGNOSIS — Z7982 Long term (current) use of aspirin: Secondary | ICD-10-CM | POA: Insufficient documentation

## 2011-09-27 DIAGNOSIS — R42 Dizziness and giddiness: Secondary | ICD-10-CM | POA: Insufficient documentation

## 2011-09-27 LAB — URINALYSIS, ROUTINE W REFLEX MICROSCOPIC
Bilirubin Urine: NEGATIVE
Glucose, UA: NEGATIVE mg/dL
Hgb urine dipstick: NEGATIVE
Specific Gravity, Urine: 1.007 (ref 1.005–1.030)
Urobilinogen, UA: 0.2 mg/dL (ref 0.0–1.0)

## 2011-09-27 LAB — CBC
Platelets: 229 10*3/uL (ref 150–400)
RBC: 3.73 MIL/uL — ABNORMAL LOW (ref 3.87–5.11)
RDW: 12.9 % (ref 11.5–15.5)
WBC: 11.5 10*3/uL — ABNORMAL HIGH (ref 4.0–10.5)

## 2011-09-27 LAB — DIFFERENTIAL
Basophils Absolute: 0 10*3/uL (ref 0.0–0.1)
Lymphocytes Relative: 17 % (ref 12–46)
Lymphs Abs: 1.9 10*3/uL (ref 0.7–4.0)
Neutro Abs: 8.3 10*3/uL — ABNORMAL HIGH (ref 1.7–7.7)
Neutrophils Relative %: 72 % (ref 43–77)

## 2011-09-27 LAB — COMPREHENSIVE METABOLIC PANEL
ALT: 27 U/L (ref 0–35)
AST: 20 U/L (ref 0–37)
Alkaline Phosphatase: 69 U/L (ref 39–117)
CO2: 19 mEq/L (ref 19–32)
Calcium: 9.2 mg/dL (ref 8.4–10.5)
GFR calc Af Amer: 38 mL/min — ABNORMAL LOW (ref >90)
GFR calc non Af Amer: 33 mL/min — ABNORMAL LOW (ref >90)
Glucose, Bld: 190 mg/dL — ABNORMAL HIGH (ref 70–99)
Potassium: 4.7 mEq/L (ref 3.5–5.1)
Sodium: 131 mEq/L — ABNORMAL LOW (ref 135–145)
Total Protein: 7.8 g/dL (ref 6.0–8.3)

## 2011-09-27 LAB — CK: Total CK: 415 U/L — ABNORMAL HIGH (ref 7–177)

## 2011-09-27 MED ORDER — HYDROMORPHONE HCL PF 1 MG/ML IJ SOLN
1.0000 mg | Freq: Once | INTRAMUSCULAR | Status: AC
  Administered 2011-09-27: 1 mg via INTRAVENOUS
  Filled 2011-09-27: qty 1

## 2011-09-27 MED ORDER — SODIUM CHLORIDE 0.9 % IV BOLUS (SEPSIS)
1000.0000 mL | Freq: Once | INTRAVENOUS | Status: AC
  Administered 2011-09-27: 1000 mL via INTRAVENOUS

## 2011-09-27 MED ORDER — OXYCODONE-ACETAMINOPHEN 5-325 MG PO TABS: 2.0000 | ORAL_TABLET | Freq: Three times a day (TID) | ORAL | Status: AC | PRN

## 2011-09-27 MED ORDER — OXYCODONE-ACETAMINOPHEN 5-325 MG PO TABS
2.0000 | ORAL_TABLET | Freq: Once | ORAL | Status: AC
  Administered 2011-09-27: 2 via ORAL
  Filled 2011-09-27: qty 2

## 2011-09-27 MED ORDER — ONDANSETRON HCL 4 MG/2ML IJ SOLN
4.0000 mg | Freq: Once | INTRAMUSCULAR | Status: AC
  Administered 2011-09-27: 4 mg via INTRAVENOUS
  Filled 2011-09-27: qty 2

## 2011-09-27 NOTE — ED Notes (Signed)
PT ambulated with baseline gait; VSS; A&Ox3; no signs of distress; respirations even and unlabored; skin warm and dry; no questions at this time.

## 2011-09-27 NOTE — ED Notes (Signed)
Patient transported to CT 

## 2011-09-27 NOTE — ED Notes (Signed)
MD at bedside. 

## 2011-09-27 NOTE — ED Notes (Signed)
Per EMS: Pt. Reports numbness and weakness in upper extremities and dizziness.  Per EMS: weak hand grips, unsteady gait.  Per EMS: accurate blood pressures in Right arm only. Per EMS: seen yesterday by primary care [facility]

## 2011-09-27 NOTE — ED Notes (Signed)
Pt reports no relief from pain medicine; MD made aware.

## 2011-09-27 NOTE — ED Notes (Signed)
PT assisted to beside commode. PT was wobbly upon initial standing.

## 2011-09-27 NOTE — ED Notes (Addendum)
Pt. Reports N/V/D x 24 hrs. Weakness, numbness, tingling x 24 in upper and lower extremities bilaterally. Pt reports headache that radiates down her neck.  Pt. Reports unsteady gait. Hxt of muscle disorder. NAD. Denies SOB. Husband reports confusion that comes and goes since yesterday. Pt. Reports ringing/pressure in right ear. Pt. Has hxt of cataracts. Pt. Has delayed speech. A.O. x3

## 2011-09-27 NOTE — Discharge Instructions (Signed)
It is extremely important that you follow up with your specialists at [facility] for continued management of your medical condition.  If you experience any new, or concerning changes in your condition, please return to the emergency department promptly.  As we discussed, your evaluation here did not demonstrate findings consistent with acute stroke, or other acute concerning process, but there is demonstration of your disease processes are continuing.  It is extremely important to take the medication as directed, and followup as soon as possible.

## 2011-09-27 NOTE — ED Provider Notes (Signed)
History     CSN: [id number]  Arrival date & time 09/27/11  1815   First MD Initiated Contact with Patient 09/27/11 1819      Chief Complaint  Patient presents with  . Extremity Weakness    HPI This female with multiple medical problems, including polymyositis, CHF, hypertension, anxiety, now presents with concerns over a status, and I personally weakness.  Notably, the patient has been seen multiple times in the last month, both at [facility], by her endocrinologist, and by her primary care physician yesterday.  She notes that in general she has weakness, in her extremities.  Over the past day this weakness is become more pronounced in her upper extremities.  She also complains of dizziness, without any syncope, or falls.  She denies any visual changes.  She notes that she has mild nausea without any vomiting or diarrhea.  Patient denies any recent medication changes. Past Medical History  Diagnosis Date  . HTN (hypertension)   . Heart attack   . CHF (congestive heart failure)   . Diabetes mellitus   . High cholesterol   . Kidney disease     Past Surgical History  Procedure Date  . Cardiac catheterization     Family History  Problem Relation Age of Onset  . Heart disease Mother   . Heart disease Father   . Heart disease Sister   . Heart disease Daughter     History  Substance Use Topics  . Smoking status: Former Smoker -- 1.0 packs/day for 25 years    Types: Cigarettes    Quit date: 05/23/2004  . Smokeless tobacco: Not on file  . Alcohol Use: No    OB History    Grav Para Term Preterm Abortions TAB SAB Ect Mult Living                  Review of Systems  Constitutional:       HPI  HENT:       HPI otherwise negative  Eyes: Negative.   Respiratory:       HPI, otherwise negative  Cardiovascular:       HPI, otherwise nmegative  Gastrointestinal: Negative for vomiting.  Genitourinary:       HPI, otherwise negative  Musculoskeletal:       HPI, otherwise negative    Skin: Negative.   Neurological: Negative for syncope.    Allergies  Amlodipine besylate; Atenolol; Bystolic; Ciprofloxacin; Codeine; Lidocaine; Metformin; Metoprolol; and Penicillins  Home Medications   Current Outpatient Rx  Name Route Sig Dispense Refill  . ALPRAZOLAM 0.5 MG PO TABS Oral Take 0.5 mg by mouth at bedtime as needed. For anxiety    . AMLODIPINE BESYLATE 5 MG PO TABS Oral Take 7.5 mg by mouth daily.     . ASPIRIN 81 MG PO TABS Oral Take 81 mg by mouth daily.      Marland Kitchen VITAMIN D3 2000 UNITS PO TABS Oral Take 1 tablet by mouth at bedtime.      Marland Kitchen CLONIDINE HCL 0.3 MG/24HR TD PTWK Transdermal Place 1 patch onto the skin once a week. Take on mondays    . CLOPIDOGREL BISULFATE 75 MG PO TABS Oral Take 75 mg by mouth daily.      Marland Kitchen ESCITALOPRAM OXALATE 20 MG PO TABS Oral Take 20 mg by mouth daily.      . FUROSEMIDE 80 MG PO TABS Oral Take 80 mg by mouth 2 (two) times daily.      . INSULIN  GLARGINE 100 UNIT/ML [place] SOLN Subcutaneous Inject 62 Units into the skin daily.     . INSULIN LISPRO (HUMAN) 100 UNIT/ML [place] SOLN  10 units at breakfast, 10 at lunch, and 20 at dinner     . ISOSORBIDE MONONITRATE ER 60 MG PO TB24 Oral Take 60 mg by mouth daily.      Marland Kitchen LEVOTHYROXINE SODIUM 112 MCG PO TABS Oral Take 112 mcg by mouth daily.      . NEBIVOLOL HCL 2.5 MG PO TABS Oral Take 2.5 mg by mouth daily.      Marland Kitchen NEXIUM 40 MG PO CPDR  TAKE ONE CAPSULE DAILY AS NEEDED FOR REFLUX 30 capsule 0  . OMEGA-3-ACID ETHYL ESTERS 1 G PO CAPS Oral Take 2 g by mouth 2 (two) times daily.    Marland Kitchen POTASSIUM CHLORIDE CRYS ER 20 MEQ PO TBCR Oral Take 20 mEq by mouth 2 (two) times daily.      Marland Kitchen VALSARTAN 160 MG PO TABS Oral Take 160 mg by mouth 2 (two) times daily.      BP 174/41  Pulse 51  Temp(Src) 97.8 F (36.6 C) (Oral)  Resp 16  SpO2 97%  Physical Exam  Nursing note and vitals reviewed. Constitutional: She is oriented to person, place, and time. She appears well-developed and well-nourished. No distress.   HENT:  Head: Normocephalic and atraumatic.  Eyes: Conjunctivae and EOM are normal.  Cardiovascular: Normal rate and regular rhythm.   Pulmonary/Chest: Effort normal and breath sounds normal. No stridor. No respiratory distress.  Abdominal: She exhibits no distension.  Musculoskeletal: She exhibits no edema.  Neurological: She is alert and oriented to person, place, and time. She has normal strength. She displays no tremor. No cranial nerve deficit or sensory deficit. She displays no seizure activity. Gait normal.       The patient has mild diminished strength in all is 4/5, but this is symmetric.  The patient has a slow finger-nose testing, but this is symmetric. Speech is clear, not slurred.  Patient tracks visually appropriately  Skin: Skin is warm and dry.  Psychiatric: Her speech is normal and behavior is normal. Her mood appears anxious.    ED Course  Procedures (including critical care time)   Labs Reviewed  URINALYSIS, ROUTINE W REFLEX MICROSCOPIC  CBC  DIFFERENTIAL  COMPREHENSIVE METABOLIC PANEL  LIPASE, BLOOD  CK   No results found.   No diagnosis found.  Cardiac 60 sinus rhythm and normal Pulse oximetry 97% room air normal   CT scan reviewed by me  10:25 PM The patient has been ambulating to the bathroom without difficulty.  She requests pain medicine.  MDM  This elderly appearing female with multiple medical problems, including polymyositis, pulmonary hypertension, now presents with weakness, and on my exam is in no distress, though she is uncomfortable appearing.  Patient's vital signs are reassuring.  The patient has no focal neurologic deficits, though she has some slow cerebellar coordination.  The patient walks without difficulty, speaks clearly.  The patient does complain intermittently of back and neck pain, but has no pain with neck flexion, negative Kernig sign, and moves her head freely laterally.  Given these findings, the absence of leukocytosis,  fever little suspicion for ongoing infectious etiology.  There is also low suspicion for acute stroke, given the absence of focal neurologic changes, the negative head CT.  Suspicious for continuation the patient chronic disease.  I discussed this at length with the patient and her husband.  After  she achieved some relief in the emergency department, she was discharged in stable condition with explicit instructions to follow up as soon as possible and to return for any concerning changes in her condition.    Gerhard Munch, MD 09/27/11 2253

## 2011-09-28 ENCOUNTER — Inpatient Hospital Stay (HOSPITAL_COMMUNITY)
Admission: EM | Admit: 2011-09-28 | Discharge: 2011-10-06 | DRG: 126 | Disposition: A | Discharge status: Home / Self Care | Payer: BC Managed Care – PPO | Attending: Internal Medicine | Admitting: Internal Medicine

## 2011-09-28 ENCOUNTER — Emergency Department (HOSPITAL_COMMUNITY): Payer: BC Managed Care – PPO

## 2011-09-28 ENCOUNTER — Encounter (HOSPITAL_COMMUNITY): Payer: Self-pay | Admitting: *Deleted

## 2011-09-28 DIAGNOSIS — I119 Hypertensive heart disease without heart failure: Secondary | ICD-10-CM

## 2011-09-28 DIAGNOSIS — Z881 Allergy status to other antibiotic agents status: Secondary | ICD-10-CM

## 2011-09-28 DIAGNOSIS — G4733 Obstructive sleep apnea (adult) (pediatric): Secondary | ICD-10-CM | POA: Diagnosis present

## 2011-09-28 DIAGNOSIS — I272 Pulmonary hypertension, unspecified: Secondary | ICD-10-CM | POA: Diagnosis present

## 2011-09-28 DIAGNOSIS — E785 Hyperlipidemia, unspecified: Secondary | ICD-10-CM

## 2011-09-28 DIAGNOSIS — E869 Volume depletion, unspecified: Secondary | ICD-10-CM | POA: Diagnosis present

## 2011-09-28 DIAGNOSIS — R7989 Other specified abnormal findings of blood chemistry: Secondary | ICD-10-CM

## 2011-09-28 DIAGNOSIS — D72829 Elevated white blood cell count, unspecified: Secondary | ICD-10-CM | POA: Diagnosis present

## 2011-09-28 DIAGNOSIS — R42 Dizziness and giddiness: Secondary | ICD-10-CM

## 2011-09-28 DIAGNOSIS — I10 Essential (primary) hypertension: Secondary | ICD-10-CM

## 2011-09-28 DIAGNOSIS — M6281 Muscle weakness (generalized): Secondary | ICD-10-CM | POA: Diagnosis present

## 2011-09-28 DIAGNOSIS — N289 Disorder of kidney and ureter, unspecified: Secondary | ICD-10-CM

## 2011-09-28 DIAGNOSIS — E860 Dehydration: Secondary | ICD-10-CM | POA: Diagnosis present

## 2011-09-28 DIAGNOSIS — E119 Type 2 diabetes mellitus without complications: Secondary | ICD-10-CM | POA: Diagnosis present

## 2011-09-28 DIAGNOSIS — H6690 Otitis media, unspecified, unspecified ear: Secondary | ICD-10-CM

## 2011-09-28 DIAGNOSIS — R531 Weakness: Secondary | ICD-10-CM

## 2011-09-28 DIAGNOSIS — J449 Chronic obstructive pulmonary disease, unspecified: Secondary | ICD-10-CM | POA: Diagnosis present

## 2011-09-28 DIAGNOSIS — R112 Nausea with vomiting, unspecified: Secondary | ICD-10-CM

## 2011-09-28 DIAGNOSIS — D649 Anemia, unspecified: Secondary | ICD-10-CM

## 2011-09-28 DIAGNOSIS — I498 Other specified cardiac arrhythmias: Secondary | ICD-10-CM | POA: Diagnosis present

## 2011-09-28 DIAGNOSIS — Z880 Allergy status to penicillin: Secondary | ICD-10-CM

## 2011-09-28 DIAGNOSIS — R778 Other specified abnormalities of plasma proteins: Secondary | ICD-10-CM

## 2011-09-28 DIAGNOSIS — Z7982 Long term (current) use of aspirin: Secondary | ICD-10-CM

## 2011-09-28 DIAGNOSIS — R197 Diarrhea, unspecified: Secondary | ICD-10-CM

## 2011-09-28 DIAGNOSIS — I509 Heart failure, unspecified: Secondary | ICD-10-CM | POA: Diagnosis present

## 2011-09-28 DIAGNOSIS — Z87891 Personal history of nicotine dependence: Secondary | ICD-10-CM

## 2011-09-28 DIAGNOSIS — Z888 Allergy status to other drugs, medicaments and biological substances status: Secondary | ICD-10-CM

## 2011-09-28 DIAGNOSIS — B954 Other streptococcus as the cause of diseases classified elsewhere: Secondary | ICD-10-CM | POA: Diagnosis present

## 2011-09-28 DIAGNOSIS — E039 Hypothyroidism, unspecified: Secondary | ICD-10-CM

## 2011-09-28 DIAGNOSIS — G4730 Sleep apnea, unspecified: Secondary | ICD-10-CM

## 2011-09-28 DIAGNOSIS — I330 Acute and subacute infective endocarditis: Principal | ICD-10-CM | POA: Diagnosis present

## 2011-09-28 DIAGNOSIS — Z794 Long term (current) use of insulin: Secondary | ICD-10-CM

## 2011-09-28 DIAGNOSIS — N179 Acute kidney failure, unspecified: Secondary | ICD-10-CM

## 2011-09-28 DIAGNOSIS — G608 Other hereditary and idiopathic neuropathies: Secondary | ICD-10-CM

## 2011-09-28 DIAGNOSIS — B955 Unspecified streptococcus as the cause of diseases classified elsewhere: Secondary | ICD-10-CM

## 2011-09-28 DIAGNOSIS — Z79899 Other long term (current) drug therapy: Secondary | ICD-10-CM

## 2011-09-28 DIAGNOSIS — R739 Hyperglycemia, unspecified: Secondary | ICD-10-CM

## 2011-09-28 DIAGNOSIS — J4489 Other specified chronic obstructive pulmonary disease: Secondary | ICD-10-CM

## 2011-09-28 DIAGNOSIS — Z885 Allergy status to narcotic agent status: Secondary | ICD-10-CM

## 2011-09-28 HISTORY — DX: Other specified abnormalities of plasma proteins: R77.8

## 2011-09-28 HISTORY — DX: Muscle weakness (generalized): M62.81

## 2011-09-28 HISTORY — DX: Stricture of artery: I77.1

## 2011-09-28 HISTORY — DX: Other specified abnormal findings of blood chemistry: R79.89

## 2011-09-28 HISTORY — DX: Sleep apnea, unspecified: G47.30

## 2011-09-28 HISTORY — DX: Unspecified streptococcus as the cause of diseases classified elsewhere: B95.5

## 2011-09-28 HISTORY — DX: Acute and subacute infective endocarditis: I33.0

## 2011-09-28 HISTORY — DX: Pulmonary hypertension, unspecified: I27.20

## 2011-09-28 HISTORY — DX: Hypothyroidism, unspecified: E03.9

## 2011-09-28 HISTORY — DX: Acute kidney failure, unspecified: N17.9

## 2011-09-28 HISTORY — DX: Polyneuropathy, unspecified: G62.9

## 2011-09-28 HISTORY — DX: Unspecified cataract: H26.9

## 2011-09-28 HISTORY — DX: Atherosclerosis of renal artery: I70.1

## 2011-09-28 LAB — HEPATIC FUNCTION PANEL
ALT: 24 U/L (ref 0–35)
Albumin: 3.1 g/dL — ABNORMAL LOW (ref 3.5–5.2)
Alkaline Phosphatase: 92 U/L (ref 39–117)
Total Bilirubin: 0.4 mg/dL (ref 0.3–1.2)

## 2011-09-28 LAB — URINALYSIS, ROUTINE W REFLEX MICROSCOPIC
Bilirubin Urine: NEGATIVE
Glucose, UA: NEGATIVE mg/dL
Hgb urine dipstick: NEGATIVE
Ketones, ur: NEGATIVE mg/dL
Nitrite: NEGATIVE
Protein, ur: NEGATIVE mg/dL
Specific Gravity, Urine: 1.012 (ref 1.005–1.030)
Urobilinogen, UA: 0.2 mg/dL (ref 0.0–1.0)
pH: 5.5 (ref 5.0–8.0)

## 2011-09-28 LAB — COMPREHENSIVE METABOLIC PANEL
ALT: 21 U/L (ref 0–35)
AST: 15 U/L (ref 0–37)
Alkaline Phosphatase: 79 U/L (ref 39–117)
CO2: 20 mEq/L (ref 19–32)
Calcium: 8.5 mg/dL (ref 8.4–10.5)
Chloride: 102 mEq/L (ref 96–112)
GFR calc Af Amer: 27 mL/min — ABNORMAL LOW (ref >90)
GFR calc non Af Amer: 23 mL/min — ABNORMAL LOW (ref >90)
Glucose, Bld: 202 mg/dL — ABNORMAL HIGH (ref 70–99)
Sodium: 133 mEq/L — ABNORMAL LOW (ref 135–145)
Total Bilirubin: 0.3 mg/dL (ref 0.3–1.2)

## 2011-09-28 LAB — BASIC METABOLIC PANEL WITH GFR
Calcium: 9.1 mg/dL (ref 8.4–10.5)
GFR calc Af Amer: 26 mL/min — ABNORMAL LOW (ref >90)
GFR calc non Af Amer: 23 mL/min — ABNORMAL LOW (ref >90)
Sodium: 133 meq/L — ABNORMAL LOW (ref 135–145)

## 2011-09-28 LAB — CARDIAC PANEL(CRET KIN+CKTOT+MB+TROPI)
Relative Index: 4.1 — ABNORMAL HIGH (ref 0.0–2.5)
Total CK: 289 U/L — ABNORMAL HIGH (ref 7–177)

## 2011-09-28 LAB — BASIC METABOLIC PANEL
BUN: 57 mg/dL — ABNORMAL HIGH (ref 6–23)
CO2: 22 mEq/L (ref 19–32)
Chloride: 97 mEq/L (ref 96–112)
Creatinine, Ser: 2.2 mg/dL — ABNORMAL HIGH (ref 0.50–1.10)
Glucose, Bld: 107 mg/dL — ABNORMAL HIGH (ref 70–99)
Potassium: 5.6 mEq/L — ABNORMAL HIGH (ref 3.5–5.1)

## 2011-09-28 LAB — CBC
HCT: 34.6 % — ABNORMAL LOW (ref 36.0–46.0)
Hemoglobin: 11.8 g/dL — ABNORMAL LOW (ref 12.0–15.0)
MCV: 90.1 fL (ref 78.0–100.0)
WBC: 23.8 10*3/uL — ABNORMAL HIGH (ref 4.0–10.5)

## 2011-09-28 LAB — URINE MICROSCOPIC-ADD ON

## 2011-09-28 LAB — TROPONIN I: Troponin I: 0.39 ng/mL (ref <0.30)

## 2011-09-28 MED ORDER — SODIUM CHLORIDE 0.9 % IV BOLUS (SEPSIS)
1000.0000 mL | Freq: Once | INTRAVENOUS | Status: AC
  Administered 2011-09-28: 1000 mL via INTRAVENOUS

## 2011-09-28 MED ORDER — ONDANSETRON 4 MG PO TBDP
8.0000 mg | ORAL_TABLET | Freq: Once | ORAL | Status: AC
  Administered 2011-09-28: 8 mg via ORAL
  Filled 2011-09-28: qty 2

## 2011-09-28 NOTE — ED Notes (Signed)
IV team returned call.  Will be here shortly. 

## 2011-09-28 NOTE — ED Notes (Signed)
Paged IV team for start. 

## 2011-09-28 NOTE — ED Notes (Signed)
Purse sent home with husband

## 2011-09-28 NOTE — ED Notes (Signed)
Pt reports approx a week hx of weakness, falls, nausea, and vomiting. Was seen here last night for the same but states she is getting worse. Spoke with her PCP and was told to come back to ED. Pt reports she is having trouble remembering things. Reports headache.

## 2011-09-28 NOTE — ED Notes (Signed)
Attempted to call report to nurse receiving patient in [phone]. Nurse secretary took number and nurse will call back.

## 2011-09-28 NOTE — ED Provider Notes (Addendum)
History    CSN: [id number] Arrival date & time 09/28/11  1544 First MD Initiated Contact with Patient 09/28/11 1714    Chief Complaint  Patient presents with  . Fall  . Weakness  . Nausea  . Emesis    HPI Pt has been having trouble with nausea, weakness and confusion.  It started yesterday.  Pt was seen in the office and was given a prescription for antinausea medications.  Pt continued to feel poorly so she came to the ED yesterday and was treated and released.  Pt has been getting worse and fell today because of weakness.  Pt states she feels weak all over.  No cough.  She has had vomiting and diarrhea.  She states she has only had a couple of episodes of vomiting and diarrhea per day.  She had been having trouble with loose stools for over a month but this is different.  Some dysuria.  She denies stomach ache but has had some headache.  Past Medical History  Diagnosis Date  . HTN (hypertension)   . Heart attack   . CHF (congestive heart failure)   . Diabetes mellitus   . High cholesterol   . Kidney disease     Past Surgical History  Procedure Date  . Cardiac catheterization     Family History  Problem Relation Age of Onset  . Heart disease Mother   . Heart disease Father   . Heart disease Sister   . Heart disease Daughter     History  Substance Use Topics  . Smoking status: Former Smoker -- 1.0 packs/day for 25 years    Types: Cigarettes    Quit date: 05/23/2004  . Smokeless tobacco: Not on file  . Alcohol Use: No    OB History    Grav Para Term Preterm Abortions TAB SAB Ect Mult Living                  Review of Systems  Constitutional: Negative for fever.  Respiratory: Negative for chest tightness and shortness of breath.   Cardiovascular: Negative for chest pain.  Gastrointestinal: Negative for abdominal pain.  Genitourinary: Positive for frequency.  Musculoskeletal: Negative for back pain.  Skin: Negative for rash.  All other systems reviewed and are  negative.    Allergies  Amlodipine besylate; Atenolol; Bystolic; Ciprofloxacin; Codeine; Lidocaine; Metformin; Metoprolol; and Penicillins  Home Medications   Current Outpatient Rx  Name Route Sig Dispense Refill  . ALPRAZOLAM 0.5 MG PO TABS Oral Take 0.5 mg by mouth at bedtime as needed. For anxiety    . AMLODIPINE BESYLATE 5 MG PO TABS Oral Take 7.5 mg by mouth daily.     . ASPIRIN 81 MG PO TABS Oral Take 81 mg by mouth daily.      Marland Kitchen VITAMIN D3 2000 UNITS PO TABS Oral Take 1 tablet by mouth at bedtime.      Marland Kitchen CLONIDINE HCL 0.3 MG/24HR TD PTWK Transdermal Place 1 patch onto the skin once a week. Take on mondays    . CLOPIDOGREL BISULFATE 75 MG PO TABS Oral Take 75 mg by mouth daily.      Marland Kitchen ESCITALOPRAM OXALATE 20 MG PO TABS Oral Take 20 mg by mouth daily.      . FUROSEMIDE 80 MG PO TABS Oral Take 80 mg by mouth 2 (two) times daily.      . INSULIN GLARGINE 100 UNIT/ML [place] SOLN Subcutaneous Inject 62 Units into the skin daily.     Marland Kitchen  INSULIN LISPRO (HUMAN) 100 UNIT/ML [place] SOLN  10 units at breakfast, 10 at lunch, and 20 at dinner     . ISOSORBIDE MONONITRATE ER 60 MG PO TB24 Oral Take 60 mg by mouth daily.      Marland Kitchen LEVOTHYROXINE SODIUM 112 MCG PO TABS Oral Take 112 mcg by mouth daily.      . NEBIVOLOL HCL 2.5 MG PO TABS Oral Take 2.5 mg by mouth daily.      Marland Kitchen NEXIUM 40 MG PO CPDR  TAKE ONE CAPSULE DAILY AS NEEDED FOR REFLUX 30 capsule 0  . OMEGA-3-ACID ETHYL ESTERS 1 G PO CAPS Oral Take 2 g by mouth 2 (two) times daily.    . OXYCODONE-ACETAMINOPHEN 5-325 MG PO TABS Oral Take 2 tablets by mouth every 8 (eight) hours as needed for pain. 15 tablet 0  . POTASSIUM CHLORIDE CRYS ER 20 MEQ PO TBCR Oral Take 20 mEq by mouth 2 (two) times daily.      Marland Kitchen VALSARTAN 160 MG PO TABS Oral Take 160 mg by mouth 2 (two) times daily.      BP 127/45  Pulse 62  Temp(Src) 98.5 F (36.9 C) (Oral)  Resp 16  SpO2 100%  Physical Exam  Nursing note and vitals reviewed. Constitutional: She appears listless. No  distress.  HENT:  Head: Normocephalic and atraumatic.  Right Ear: External ear normal.  Left Ear: External ear normal.  Eyes: Conjunctivae are normal. Right eye exhibits no discharge. Left eye exhibits no discharge. No scleral icterus.  Neck: Neck supple. No tracheal deviation present.       Supple, no meningeal signs  Cardiovascular: Normal rate, regular rhythm and intact distal pulses.   Pulmonary/Chest: Effort normal and breath sounds normal. No stridor. No respiratory distress. She has no wheezes. She has no rales.  Abdominal: Soft. Bowel sounds are normal. She exhibits no distension. There is no tenderness. There is no rebound and no guarding.  Musculoskeletal: She exhibits no edema and no tenderness.  Neurological: She appears listless. She is not disoriented. No cranial nerve deficit ( no gross defecits noted) or sensory deficit. She exhibits normal muscle tone. She displays no seizure activity. Coordination normal.       Difficulty lifting right leg off bed greater than left, weak grip strength bilaterally  Skin: Skin is warm and dry. No rash noted. There is pallor.  Psychiatric: She has a normal mood and affect.    ED Course  Procedures (including critical care time)  Rate: 66  Rhythm: normal sinus rhythm  QRS Axis: normal  Intervals: normal  ST/T Wave abnormalities: normal except possible anterior infarct, age indeterminate  Conduction Disutrbances:none  Narrative Interpretation: Abnormal  Old EKG Reviewed: nonno significant changes  Labs Reviewed  CBC - Abnormal; Notable for the following:    WBC 23.8 (*)    RBC 3.84 (*)    Hemoglobin 11.8 (*)    HCT 34.6 (*)    All other components within normal limits  BASIC METABOLIC PANEL - Abnormal; Notable for the following:    Sodium 133 (*)    Potassium 5.6 (*)    Glucose, Bld 107 (*)    BUN 57 (*)    Creatinine, Ser 2.20 (*)    GFR calc non Af Amer 23 (*)    GFR calc Af Amer 26 (*)    All other components within normal  limits  URINALYSIS, ROUTINE W REFLEX MICROSCOPIC - Abnormal; Notable for the following:    Leukocytes, UA TRACE (*)  All other components within normal limits  TROPONIN I - Abnormal; Notable for the following:    Troponin I 0.39 (*)    All other components within normal limits  HEPATIC FUNCTION PANEL - Abnormal; Notable for the following:    Albumin 3.1 (*)    All other components within normal limits  CARDIAC PANEL(CRET KIN+CKTOT+MB+TROPI) - Abnormal; Notable for the following:    Total CK 289 (*)    CK, MB 11.9 (*)    Troponin I 0.69 (*)    Relative Index 4.1 (*)    All other components within normal limits  COMPREHENSIVE METABOLIC PANEL - Abnormal; Notable for the following:    Sodium 133 (*)    Glucose, Bld 202 (*)    BUN 61 (*)    Creatinine, Ser 2.15 (*)    Albumin 2.8 (*)    GFR calc non Af Amer 23 (*)    GFR calc Af Amer 27 (*)    All other components within normal limits  LACTIC ACID, PLASMA  URINE MICROSCOPIC-ADD ON  PROCALCITONIN  CULTURE, BLOOD (ROUTINE X 2)  CULTURE, BLOOD (ROUTINE X 2)   Dg Chest 2 View  09/27/2011  *RADIOLOGY REPORT*  Clinical Data: Chest discomfort.  CHEST - 2 VIEW  Comparison: 02/10/2011  Findings: The lungs are clear without focal infiltrate, edema, pneumothorax or pleural effusion. Interstitial markings are diffusely coarsened with chronic features.  Cardiopericardial silhouette is at upper limits of normal for size. Imaged bony structures of the thorax are intact.  IMPRESSION: Stable.  No acute findings.  Original Report Authenticated By: ERIC A. MANSELL, M.D.   Ct Head Wo Contrast  09/27/2011  *RADIOLOGY REPORT*  Clinical Data: Nausea, vomiting, diarrhea for 24 hours.  Weakness, numbness and parasthesias of all four extremities.  Headache.  CT HEAD WITHOUT CONTRAST  Technique:  Contiguous axial images were obtained from the base of the skull through the vertex without contrast.  Comparison: 05/26/2006.  Findings: No mass lesion, mass effect,  midline shift, hydrocephalus, hemorrhage.  No acute territorial cortical ischemia/infarct. Atrophy and chronic ischemic white matter disease is present.  Paranasal sinuses appear normal.  Mastoid air cells are within normal limits.  Intracranial atherosclerosis.  IMPRESSION: Mild atrophy and chronic ischemic white matter disease without acute intracranial abnormality.  Original Report Authenticated By: Andreas Newport, M.D.      MDM  Patient has had persistent weakness and increasing renal insufficiency. She also has a significant leukocytosis although I cannot determine any specific bacterial infection. She does not appear to have pneumonia and does not have evidence of a UTI. I reviewed her old records. Patient has an elevated troponin but apparently has history of elevated CPK in the past and had some question of some type of neuromuscular degenerative disorder. Patient is progressively weaker. With her progressive symptoms and abnormal laboratory values I will consult medicine for admission.        Celene Kras, MD 09/28/11 2120  Celene Kras, MD 09/29/11 [phone]

## 2011-09-28 NOTE — ED Notes (Signed)
Tried to ambulate pt to the bathroom and obtain a UA.  Pt became extremely pale and began to faint.  Placed pt back in bed and put her on a bedpan.  Pt unable to obtain a UA for [place] at this time.

## 2011-09-29 ENCOUNTER — Encounter (HOSPITAL_COMMUNITY): Payer: Self-pay | Admitting: Internal Medicine

## 2011-09-29 DIAGNOSIS — R5383 Other fatigue: Secondary | ICD-10-CM

## 2011-09-29 DIAGNOSIS — R531 Weakness: Secondary | ICD-10-CM | POA: Diagnosis present

## 2011-09-29 DIAGNOSIS — R197 Diarrhea, unspecified: Secondary | ICD-10-CM | POA: Diagnosis present

## 2011-09-29 DIAGNOSIS — M6281 Muscle weakness (generalized): Secondary | ICD-10-CM | POA: Diagnosis present

## 2011-09-29 DIAGNOSIS — N179 Acute kidney failure, unspecified: Secondary | ICD-10-CM

## 2011-09-29 DIAGNOSIS — R112 Nausea with vomiting, unspecified: Secondary | ICD-10-CM | POA: Diagnosis present

## 2011-09-29 DIAGNOSIS — R5381 Other malaise: Secondary | ICD-10-CM

## 2011-09-29 DIAGNOSIS — D72829 Elevated white blood cell count, unspecified: Secondary | ICD-10-CM | POA: Diagnosis present

## 2011-09-29 DIAGNOSIS — I272 Pulmonary hypertension, unspecified: Secondary | ICD-10-CM

## 2011-09-29 DIAGNOSIS — E869 Volume depletion, unspecified: Secondary | ICD-10-CM | POA: Diagnosis present

## 2011-09-29 DIAGNOSIS — E1165 Type 2 diabetes mellitus with hyperglycemia: Secondary | ICD-10-CM

## 2011-09-29 DIAGNOSIS — D7289 Other specified disorders of white blood cells: Secondary | ICD-10-CM

## 2011-09-29 HISTORY — DX: Acute kidney failure, unspecified: N17.9

## 2011-09-29 HISTORY — DX: Pulmonary hypertension, unspecified: I27.20

## 2011-09-29 HISTORY — DX: Muscle weakness (generalized): M62.81

## 2011-09-29 LAB — COMPREHENSIVE METABOLIC PANEL
AST: 15 U/L (ref 0–37)
Albumin: 2.7 g/dL — ABNORMAL LOW (ref 3.5–5.2)
Albumin: 2.6 g/dL — ABNORMAL LOW (ref 3.5–5.2)
Alkaline Phosphatase: 72 U/L (ref 39–117)
Alkaline Phosphatase: 68 U/L (ref 39–117)
BUN: 53 mg/dL — ABNORMAL HIGH (ref 6–23)
Calcium: 8.6 mg/dL (ref 8.4–10.5)
Chloride: 101 mEq/L (ref 96–112)
Potassium: 4.4 mEq/L (ref 3.5–5.1)
Potassium: 4.1 mEq/L (ref 3.5–5.1)
Sodium: 133 mEq/L — ABNORMAL LOW (ref 135–145)
Total Bilirubin: 0.3 mg/dL (ref 0.3–1.2)
Total Protein: 6.3 g/dL (ref 6.0–8.3)
Total Protein: 6.3 g/dL (ref 6.0–8.3)

## 2011-09-29 LAB — GLUCOSE, CAPILLARY
Glucose-Capillary: 188 mg/dL — ABNORMAL HIGH (ref 70–99)
Glucose-Capillary: 206 mg/dL — ABNORMAL HIGH (ref 70–99)
Glucose-Capillary: 187 mg/dL — ABNORMAL HIGH (ref 70–99)

## 2011-09-29 LAB — CBC
HCT: 28 % — ABNORMAL LOW (ref 36.0–46.0)
HCT: 27.5 % — ABNORMAL LOW (ref 36.0–46.0)
Hemoglobin: 9.4 g/dL — ABNORMAL LOW (ref 12.0–15.0)
MCH: 31.7 pg (ref 26.0–34.0)
MCV: 90.6 fL (ref 78.0–100.0)
MCV: 89.9 fL (ref 78.0–100.0)
Platelets: 195 10*3/uL (ref 150–400)
RBC: 3.06 MIL/uL — ABNORMAL LOW (ref 3.87–5.11)
RDW: 13.3 % (ref 11.5–15.5)
WBC: 13.5 10*3/uL — ABNORMAL HIGH (ref 4.0–10.5)

## 2011-09-29 LAB — DIFFERENTIAL
Basophils Absolute: 0 10*3/uL (ref 0.0–0.1)
Eosinophils Absolute: 0 10*3/uL (ref 0.0–0.7)
Eosinophils Relative: 0 % (ref 0–5)
Lymphocytes Relative: 12 % (ref 12–46)
Lymphs Abs: 1.9 10*3/uL (ref 0.7–4.0)
Monocytes Absolute: 1.4 10*3/uL — ABNORMAL HIGH (ref 0.1–1.0)

## 2011-09-29 LAB — LACTIC ACID, PLASMA: Lactic Acid, Venous: 0.8 mmol/L (ref 0.5–2.2)

## 2011-09-29 LAB — SEDIMENTATION RATE: Sed Rate: 68 mm/hr — ABNORMAL HIGH (ref 0–22)

## 2011-09-29 LAB — LACTATE DEHYDROGENASE: LDH: 257 U/L — ABNORMAL HIGH (ref 94–250)

## 2011-09-29 LAB — TSH: TSH: 1.077 u[IU]/mL (ref 0.350–4.500)

## 2011-09-29 LAB — CARDIAC PANEL(CRET KIN+CKTOT+MB+TROPI)
Relative Index: 4.1 — ABNORMAL HIGH (ref 0.0–2.5)
Troponin I: 1.01 ng/mL (ref <0.30)

## 2011-09-29 LAB — HEPARIN LEVEL (UNFRACTIONATED)
Heparin Unfractionated: 0.12 IU/mL — ABNORMAL LOW (ref 0.30–0.70)
Heparin Unfractionated: 0.38 IU/mL (ref 0.30–0.70)

## 2011-09-29 MED ORDER — NEBIVOLOL HCL 2.5 MG PO TABS
2.5000 mg | ORAL_TABLET | Freq: Every day | ORAL | Status: DC
  Administered 2011-09-29 – 2011-10-01 (×3): 2.5 mg via ORAL
  Filled 2011-09-29 (×4): qty 1

## 2011-09-29 MED ORDER — ONDANSETRON HCL 4 MG PO TABS: 4.0000 mg | ORAL_TABLET | Freq: Four times a day (QID) | ORAL | Status: DC | PRN

## 2011-09-29 MED ORDER — OMEGA-3-ACID ETHYL ESTERS 1 G PO CAPS
2.0000 g | ORAL_CAPSULE | Freq: Two times a day (BID) | ORAL | Status: DC
  Administered 2011-09-29 – 2011-10-06 (×12): 2 g via ORAL
  Filled 2011-09-29 (×18): qty 2

## 2011-09-29 MED ORDER — AMLODIPINE BESYLATE 5 MG PO TABS
7.5000 mg | ORAL_TABLET | Freq: Every day | ORAL | Status: DC
  Administered 2011-09-29 – 2011-10-01 (×3): 7.5 mg via ORAL
  Filled 2011-09-29 (×5): qty 1

## 2011-09-29 MED ORDER — HEPARIN BOLUS VIA INFUSION
4000.0000 [IU] | Freq: Once | INTRAVENOUS | Status: AC
  Administered 2011-09-29: 4000 [IU] via INTRAVENOUS
  Filled 2011-09-29: qty 4000

## 2011-09-29 MED ORDER — SODIUM CHLORIDE 0.9 % IJ SOLN
3.0000 mL | Freq: Two times a day (BID) | INTRAMUSCULAR | Status: DC
  Administered 2011-09-29 – 2011-10-04 (×3): 3 mL via INTRAVENOUS

## 2011-09-29 MED ORDER — PREDNISONE 20 MG PO TABS
20.0000 mg | ORAL_TABLET | Freq: Every day | ORAL | Status: DC
  Administered 2011-09-29 – 2011-10-06 (×8): 20 mg via ORAL
  Filled 2011-09-29 (×11): qty 1

## 2011-09-29 MED ORDER — ASPIRIN 325 MG PO TABS
325.0000 mg | ORAL_TABLET | Freq: Once | ORAL | Status: AC
  Administered 2011-09-29: 325 mg via ORAL
  Filled 2011-09-29: qty 1

## 2011-09-29 MED ORDER — INSULIN ASPART 100 UNIT/ML ~~LOC~~ SOLN
0.0000 [IU] | Freq: Three times a day (TID) | SUBCUTANEOUS | Status: DC
  Administered 2011-09-29: 2 [IU] via SUBCUTANEOUS
  Administered 2011-09-29: 3 [IU] via SUBCUTANEOUS
  Administered 2011-09-30: 1 [IU] via SUBCUTANEOUS
  Administered 2011-09-30: 2 [IU] via SUBCUTANEOUS

## 2011-09-29 MED ORDER — INSULIN GLARGINE 100 UNIT/ML ~~LOC~~ SOLN
62.0000 [IU] | Freq: Every day | SUBCUTANEOUS | Status: DC
  Administered 2011-09-29 – 2011-09-30 (×2): 62 [IU] via SUBCUTANEOUS

## 2011-09-29 MED ORDER — VANCOMYCIN HCL IN DEXTROSE 1-5 GM/200ML-% IV SOLN
1000.0000 mg | INTRAVENOUS | Status: DC
  Administered 2011-09-29 – 2011-09-30 (×2): 1000 mg via INTRAVENOUS
  Filled 2011-09-29 (×5): qty 200

## 2011-09-29 MED ORDER — ASPIRIN EC 325 MG PO TBEC: 325.0000 mg | DELAYED_RELEASE_TABLET | Freq: Every day | ORAL | Status: DC

## 2011-09-29 MED ORDER — HEPARIN (PORCINE) IN NACL 100-0.45 UNIT/ML-% IJ SOLN
1200.0000 [IU]/h | INTRAMUSCULAR | Status: DC
  Administered 2011-09-29 – 2011-09-30 (×3): 1200 [IU]/h via INTRAVENOUS
  Filled 2011-09-29 (×5): qty 250

## 2011-09-29 MED ORDER — PANTOPRAZOLE SODIUM 40 MG PO TBEC
40.0000 mg | DELAYED_RELEASE_TABLET | Freq: Every day | ORAL | Status: DC
  Administered 2011-09-29 – 2011-10-06 (×8): 40 mg via ORAL
  Filled 2011-09-29 (×8): qty 1

## 2011-09-29 MED ORDER — OXYCODONE-ACETAMINOPHEN 5-325 MG PO TABS
1.0000 | ORAL_TABLET | Freq: Three times a day (TID) | ORAL | Status: DC | PRN
  Administered 2011-09-29 – 2011-10-01 (×2): 1 via ORAL
  Filled 2011-09-29 (×2): qty 1

## 2011-09-29 MED ORDER — HEPARIN (PORCINE) IN NACL 100-0.45 UNIT/ML-% IJ SOLN
1000.0000 [IU]/h | INTRAMUSCULAR | Status: DC
  Administered 2011-09-29 (×2): 1000 [IU]/h via INTRAVENOUS
  Filled 2011-09-29: qty 250

## 2011-09-29 MED ORDER — DEXTROSE 5 % IV SOLN: 1.0000 g | INTRAVENOUS | Status: DC

## 2011-09-29 MED ORDER — ESCITALOPRAM OXALATE 20 MG PO TABS
20.0000 mg | ORAL_TABLET | Freq: Every day | ORAL | Status: DC
  Administered 2011-09-29 – 2011-10-06 (×8): 20 mg via ORAL
  Filled 2011-09-29 (×9): qty 1

## 2011-09-29 MED ORDER — ACETAMINOPHEN 325 MG PO TABS
650.0000 mg | ORAL_TABLET | Freq: Four times a day (QID) | ORAL | Status: DC | PRN
  Administered 2011-09-29 – 2011-10-03 (×3): 650 mg via ORAL
  Filled 2011-09-29 (×3): qty 2

## 2011-09-29 MED ORDER — HYDRALAZINE HCL 20 MG/ML IJ SOLN
10.0000 mg | INTRAMUSCULAR | Status: DC | PRN
  Administered 2011-10-05: 10 mg via INTRAVENOUS
  Filled 2011-09-29 (×4): qty 0.5

## 2011-09-29 MED ORDER — LEVOTHYROXINE SODIUM 112 MCG PO TABS
112.0000 ug | ORAL_TABLET | Freq: Every day | ORAL | Status: DC
  Administered 2011-09-29 – 2011-10-06 (×8): 112 ug via ORAL
  Filled 2011-09-29 (×12): qty 1

## 2011-09-29 MED ORDER — CLOPIDOGREL BISULFATE 75 MG PO TABS
75.0000 mg | ORAL_TABLET | Freq: Every day | ORAL | Status: DC
  Administered 2011-09-29 – 2011-10-03 (×5): 75 mg via ORAL
  Filled 2011-09-29 (×9): qty 1

## 2011-09-29 MED ORDER — HEPARIN BOLUS VIA INFUSION
2500.0000 [IU] | Freq: Once | INTRAVENOUS | Status: AC
  Administered 2011-09-29: 2500 [IU] via INTRAVENOUS
  Filled 2011-09-29: qty 2500

## 2011-09-29 MED ORDER — ISOSORBIDE MONONITRATE ER 60 MG PO TB24
60.0000 mg | ORAL_TABLET | Freq: Every day | ORAL | Status: DC
  Administered 2011-09-29 – 2011-10-06 (×8): 60 mg via ORAL
  Filled 2011-09-29 (×9): qty 1

## 2011-09-29 MED ORDER — CLONIDINE HCL 0.3 MG/24HR TD PTWK
0.3000 mg | MEDICATED_PATCH | TRANSDERMAL | Status: DC
  Administered 2011-09-29: 0.3 mg via TRANSDERMAL
  Filled 2011-09-29 (×2): qty 1

## 2011-09-29 MED ORDER — ACETAMINOPHEN 650 MG RE SUPP: 650.0000 mg | Freq: Four times a day (QID) | RECTAL | Status: DC | PRN

## 2011-09-29 MED ORDER — ONDANSETRON HCL 4 MG/2ML IJ SOLN
4.0000 mg | Freq: Four times a day (QID) | INTRAMUSCULAR | Status: DC | PRN
  Administered 2011-09-29 – 2011-10-01 (×5): 4 mg via INTRAVENOUS
  Filled 2011-09-29 (×5): qty 2

## 2011-09-29 MED ORDER — ASPIRIN EC 325 MG PO TBEC
325.0000 mg | DELAYED_RELEASE_TABLET | Freq: Every day | ORAL | Status: DC
  Filled 2011-09-29: qty 1

## 2011-09-29 MED ORDER — SODIUM CHLORIDE 0.9 % IV SOLN
INTRAVENOUS | Status: DC
  Administered 2011-09-29 (×2): via INTRAVENOUS
  Administered 2011-10-01 – 2011-10-02 (×3): 20 mL/h via INTRAVENOUS
  Administered 2011-10-04: 19:00:00 via INTRAVENOUS

## 2011-09-29 MED ORDER — ALPRAZOLAM 0.5 MG PO TABS
0.5000 mg | ORAL_TABLET | Freq: Every evening | ORAL | Status: DC | PRN
  Administered 2011-10-01 – 2011-10-04 (×3): 0.5 mg via ORAL
  Filled 2011-09-29 (×4): qty 1

## 2011-09-29 NOTE — Progress Notes (Signed)
Patient ID: [name], female   DOB: 1945/12/06, 66 y.o.   MRN: [id number]  HPI: See admission HPI as pt was just admitted.  S: This NP was called by Dr. Toniann Fail because pt's troponins have trended upward since arriving to ED. IV Heparin ordered by Dr. Toniann Fail. NP to bedside. RN says pt has been resting well and has not c/o any chest pain. Pt does c/o mild SOB over the past several days when her sx of weakness started. She denies acute abd or back pain. She has a hx of MI with cardiac stenting per Dr. Erlene Quan here in [place]. Last cardiac cath without changes in 2012.  O: VSS. This is a well appearing WF in NAD. She is A&O x 3 and appropriate in conversation. Affect is light. Card: S1S2, RRR. Lungs: normal resp effort. On O2 per [place]. O2 sat normal. Lungs CTA.  Abd: obese, soft. Skin is pink, warm and dry. No cyanosis noted.  A/P: 1. Weakness, n/v with increasing troponins in the face of CAD and hx MI. Begin Heparin gtt, transfer pt to step down unit for closer monitoring. Cardiac is aware per admission H&P. She is presently free of chest pain or increased resp effort. Will follow. Maren Reamer, NP [facility]

## 2011-09-29 NOTE — Progress Notes (Signed)
ANTICOAGULATION CONSULT NOTE - Initial Consult  Pharmacy Consult for Heparin Indication: chest pain/ACS  Allergies  Allergen Reactions  . Amlodipine Besylate     unknown  . Atenolol     unknown  . Bystolic (Nebivolol Hcl)     Cannot tolerate greater then 5 mg, causes chest pain  . Ciprofloxacin     unknown  . Codeine     swelling  . Lidocaine     Throat swelling  . Metformin     vomiting  . Metoprolol     unknown  . Penicillins     rash    Patient Measurements: Weight: 184 lb 11.9 oz (83.8 kg)  Vital Signs: Temp: 101 F (38.3 C) (05/08 2310) Temp src: Oral (05/08 2310) BP: 119/52 mmHg (05/08 2310) Pulse Rate: 66  (05/08 2310)  Labs:  Basename 09/29/11 0159 09/29/11 0137 09/28/11 2139 09/28/11 2138 09/28/11 1901 09/28/11 1643 09/28/11 1616 09/27/11 1853  HGB 9.8* -- -- -- -- -- 11.8* --  HCT 28.0* -- -- -- -- -- 34.6* 33.1*  PLT 195 -- -- -- -- -- 260 229  APTT -- -- -- -- -- -- -- --  LABPROT -- -- -- -- -- -- -- --  INR -- -- -- -- -- -- -- --  HEPARINUNFRC -- -- -- -- -- -- -- --  CREATININE -- -- 2.15* -- -- 2.20* -- 1.61*  CKTOTAL -- 280* -- 289* -- -- -- 415*  CKMB -- 11.4* -- 11.9* -- -- -- --  TROPONINI -- 1.01* -- 0.69* 0.39* -- -- --    The CrCl is unknown because both a height and weight (above a minimum accepted value) are required for this calculation.   Medical History: Past Medical History  Diagnosis Date  . HTN (hypertension)   . Heart attack   . CHF (congestive heart failure)   . Diabetes mellitus   . High cholesterol   . Kidney disease     Medications:  Prescriptions prior to admission  Medication Sig Dispense Refill  . ALPRAZolam (XANAX) 0.5 MG tablet Take 0.5 mg by mouth at bedtime as needed. For anxiety      . amLODipine (NORVASC) 5 MG tablet Take 7.5 mg by mouth daily.       Marland Kitchen aspirin 81 MG tablet Take 81 mg by mouth daily.        . Cholecalciferol (VITAMIN D3) 2000 UNITS TABS Take 1 tablet by mouth at bedtime.        .  cloNIDine (CATAPRES - DOSED IN MG/24 HR) 0.3 mg/24hr Place 1 patch onto the skin once a week. Take on mondays      . clopidogrel (PLAVIX) 75 MG tablet Take 75 mg by mouth daily.        Marland Kitchen escitalopram (LEXAPRO) 20 MG tablet Take 20 mg by mouth daily.        . furosemide (LASIX) 80 MG tablet Take 80 mg by mouth 2 (two) times daily.        . insulin glargine (LANTUS) 100 UNIT/ML injection Inject 62 Units into the skin daily.       . insulin lispro (HUMALOG) 100 UNIT/ML injection 10 units at breakfast, 10 at lunch, and 20 at dinner       . isosorbide mononitrate (IMDUR) 60 MG 24 hr tablet Take 60 mg by mouth daily.        Marland Kitchen levothyroxine (SYNTHROID, LEVOTHROID) 112 MCG tablet Take 112 mcg by mouth daily.        Marland Kitchen  nebivolol (BYSTOLIC) 2.5 MG tablet Take 2.5 mg by mouth daily.        Marland Kitchen NEXIUM 40 MG capsule TAKE ONE CAPSULE DAILY AS NEEDED FOR REFLUX  30 capsule  0  . omega-3 acid ethyl esters (LOVAZA) 1 G capsule Take 2 g by mouth 2 (two) times daily.      Marland Kitchen oxyCODONE-acetaminophen (PERCOCET) 5-325 MG per tablet Take 2 tablets by mouth every 8 (eight) hours as needed for pain.  15 tablet  0  . potassium chloride SA (K-DUR,KLOR-CON) 20 MEQ tablet Take 20 mEq by mouth 2 (two) times daily.        . valsartan (DIOVAN) 160 MG tablet Take 160 mg by mouth 2 (two) times daily.        Assessment: 66 yo female with elevated cardiac markers for Heparin  Goal of Therapy:  Heparin level 0.3-0.7 units/ml Monitor platelets by anticoagulation protocol: Yes   Plan:  Heparin 4000 units IV bolus, then 1000 units/hr Check heparin level in 8 hours.   Lowen Mansouri, Gary Fleet 09/29/2011,3:37 AM

## 2011-09-29 NOTE — Progress Notes (Signed)
ANTICOAGULATION CONSULT NOTE - Follow Up Consult  Pharmacy Consult for Heparin Indication: positive cardiac enzymes  Allergies  Allergen Reactions  . Amlodipine Besylate     unknown  . Atenolol     unknown  . Bystolic (Nebivolol Hcl)     Cannot tolerate greater then 5 mg, causes chest pain  . Ciprofloxacin     unknown  . Codeine     swelling  . Lidocaine     Throat swelling  . Metformin     vomiting  . Metoprolol     unknown  . Penicillins     rash    Patient Measurements: Height: 5\' 3"  (160 cm) Weight: 188 lb 0.8 oz (85.3 kg) IBW/kg (Calculated) : 52.4  Heparin Dosing Weight: 71.4  Vital Signs: Temp: 97.6 F (36.4 C) (05/09 2023) Temp src: Oral (05/09 2023) BP: 143/44 mmHg (05/09 2023) Pulse Rate: 57  (05/09 2023)  Labs:  Basename 09/29/11 2000 09/29/11 1206 09/29/11 0700 09/29/11 0313 09/29/11 0159 09/29/11 0137 09/28/11 2139 09/28/11 2138 09/28/11 1901 09/28/11 1616 09/27/11 1853  HGB -- -- 9.4* -- 9.8* -- -- -- -- -- --  HCT -- -- 27.5* -- 28.0* -- -- -- -- 34.6* --  PLT -- -- 189 -- 195 -- -- -- -- 260 --  APTT -- -- -- -- -- -- -- -- -- -- --  LABPROT -- -- -- -- -- -- -- -- -- -- --  INR -- -- -- -- -- -- -- -- -- -- --  HEPARINUNFRC 0.38 0.12* -- -- -- -- -- -- -- -- --  CREATININE -- -- 1.85* 2.00* -- -- 2.15* -- -- -- --  CKTOTAL -- -- -- -- -- 280* -- 289* -- -- 415*  CKMB -- -- -- -- -- 11.4* -- 11.9* -- -- --  TROPONINI -- -- -- -- -- 1.01* -- 0.69* 0.39* -- --    Estimated Creatinine Clearance: 32.2 ml/min (by C-G formula based on Cr of 1.85).   Medications:  Infusions:    . sodium chloride 20 mL/hr at 09/29/11 1200  . heparin 1,200 Units/hr (09/29/11 1331)  . DISCONTD: heparin 1,000 Units/hr (09/29/11 0500)    Assessment: Positive cardiac enzymes:  Heparin level is now therapeutic after adjustment.  Goal of Therapy:  Heparin level 0.3-0.7 units/ml Monitor platelets by anticoagulation protocol: Yes   Plan:  Continue Heparin at  1200 units/hr Check AM Heparin level and CBC  Estella Husk, Pharm.D., BCPS Clinical Pharmacist  Pager [phone] 09/29/2011, 8:56 PM

## 2011-09-29 NOTE — Progress Notes (Signed)
[facility]   Subjective: Entered the room and found the patient sitting on bedside commode and endorsing inability to void and a burning sensation with attempts to void. The symptoms began this morning. Upon further clarification with patient she endorses these symptoms are because she feels she can't pee while sitting on the bedside commode and prefers to void in the privacy of a regular bathroom. Denies chest pain or shortness of breath at the present time. Clarifies she has been recently started on a two-week course of prednisone for recurrent myalgia and myositis symptoms.  Objective: Vital signs in last 24 hours: Temp:  [97.6 F (36.4 C)-101 F (38.3 C)] 97.6 F (36.4 C) (05/09 1200) Pulse Rate:  [58-66] 58  (05/09 0753) Resp:  [15-26] 20  (05/09 0753) BP: (106-150)/(23-68) 150/39 mmHg (05/09 1200) SpO2:  [89 %-100 %] 98 % (05/09 1200) FiO2 (%):  [28 %] 28 % (05/09 0141) Weight:  [83.8 kg (184 lb 11.9 oz)-85.3 kg (188 lb 0.8 oz)] 85.3 kg (188 lb 0.8 oz) (05/09 0535) Weight change:  Last BM Date: 09/28/11  Intake/Output from previous day: 05/08 0701 - 05/09 0700 In: 330 [I.V.:330] Out: 200 [Urine:200] Intake/Output this shift: Total I/O In: 710 [P.O.:240; I.V.:470] Out: 400 [Urine:400]  General appearance: alert, cooperative, appears stated age and no distress Resp: clear to auscultation bilaterally, room air with saturations 98% Cardio: regular rate and rhythm, S1, S2 normal, no murmur, click, rub or gallop GI: soft, non-tender; bowel sounds normal; no masses,  no organomegaly Extremities: extremities normal, atraumatic, no cyanosis or edema Neurologic: Grossly normal  Lab Results:  Basename 09/29/11 0700 09/29/11 0159  WBC 13.5* 15.5*  HGB 9.4* 9.8*  HCT 27.5* 28.0*  PLT 189 195   BMET  Basename 09/29/11 0700 09/29/11 0313  NA 135 133*  K 4.1 4.4  CL 102 101  CO2 18* 20  GLUCOSE 126* 140*  BUN 53* 57*  CREATININE 1.85* 2.00*  CALCIUM 8.6 8.5     Studies/Results: Dg Chest 2 View  09/28/2011  *RADIOLOGY REPORT*  Clinical Data: Leukocytosis.  Weakness.  Tachycardia.  CHEST - 2 VIEW  Comparison: 09/27/2011  Findings: Lung volumes are low. Cardiopericardial silhouette is at upper limits of normal for size. Interstitial markings are diffusely coarsened with chronic features.  No edema or focal airspace consolidation.  No pleural effusion. Imaged bony structures of the thorax are intact.  IMPRESSION: Low volume film with borderline cardiomegaly underlying chronic interstitial coarsening.  No interval change.  Original Report Authenticated By: ERIC A. MANSELL, M.D.   Dg Chest 2 View  09/27/2011  *RADIOLOGY REPORT*  Clinical Data: Chest discomfort.  CHEST - 2 VIEW  Comparison: 02/10/2011  Findings: The lungs are clear without focal infiltrate, edema, pneumothorax or pleural effusion. Interstitial markings are diffusely coarsened with chronic features.  Cardiopericardial silhouette is at upper limits of normal for size. Imaged bony structures of the thorax are intact.  IMPRESSION: Stable.  No acute findings.  Original Report Authenticated By: ERIC A. MANSELL, M.D.   Ct Head Wo Contrast  09/27/2011  *RADIOLOGY REPORT*  Clinical Data: Nausea, vomiting, diarrhea for 24 hours.  Weakness, numbness and parasthesias of all four extremities.  Headache.  CT HEAD WITHOUT CONTRAST  Technique:  Contiguous axial images were obtained from the base of the skull through the vertex without contrast.  Comparison: 05/26/2006.  Findings: No mass lesion, mass effect, midline shift, hydrocephalus, hemorrhage.  No acute territorial cortical ischemia/infarct. Atrophy and chronic ischemic white matter disease is present.  Paranasal sinuses appear normal.  Mastoid air cells are within normal limits.  Intracranial atherosclerosis.  IMPRESSION: Mild atrophy and chronic ischemic white matter disease without acute intracranial abnormality.  Original Report Authenticated By: Andreas Newport, M.D.    Medications: I have reviewed the patient's current medications.  Assessment/Plan:  Principal Problem:  *Elevated troponin *Appreciate cardiologist assisted *Has undergone cardiac catheterization in 2012 with normal coronary arteries and LV function; and apparently has also undergone right and left heart catheterization in the past year at [facility] for evaluation of her pulmonary hypertension *Cardiology doubts chest pain is ischemic in nature but we'll check a 2-D echocardiogram and pending these findings may need to undergo cardiac catheterization this admission  Active Problems:  Acute renal failure *Improved after hydration *Baseline creatinine 20 and 1.2 and 1.8 one year prior *Given presenting symptoms best approach to diuresis/treatment diastolic heart failure would be utilization of when necessary Lasix based on weight gain and not scheduled Lasix *Agree with holding any contrasted studies until renal function completely recovers   Nausea with vomiting and Diarrhea *Suspect acute gastroenteritis *Stool for C. difficile colitis pending   Volume depletion, gastrointestinal loss *Related to above problem with gastroenteritis *Complicated by ongoing Lasix usage *Now seems euvolemic therefore we'll decrease IV fluids to keep open rate   Leukocytosis/Fever *Initial white count 23,800 and now down to 13,500 *source may be gastroenteritis *Urinalysis shows no evidence of infection * presenting chest x-ray was negative   Pulmonary HTN-severe- due to diastolic dysfunction *Has undergone extensive evaluation at [facility] and based on recent right heart catheterization the etiology to her pulmonary hypertension was due to diastolic dysfunction *Continue afterload reduction with nitrates she is [place] on Imdur *2-D echocardiogram pending and based on these findings cardiology may perform another right heart catheterization *If indicated by severity of RV dysfunction and or  severe progression and pulmonary hypertension patient may need to be started on chronic Coumadin anticoagulation therefore we will continue current IV heparin until this is clarified the cardiology   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 *Also extensively evaluated at [facility] *Recently evaluated by ENT and started on a two-week course of prednisone 20 mg daily therefore we will resume this medication while here *Patient has family history of ALS but previous evaluations at [facility] show no evidence of this disorder  Peripheral vascular disease *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 and has undergone prior stent placement *Continue plavix   HYPERLIPIDEMIA *History of statin intolerance *Continue omega-3 fatty acids   COPD UNSPECIFIED *Appears to be compensated at this time   VERTIGO *Currently no symptoms *Was also dizzy prior to presentation and possibly related to recent dehydration complicated by underlying known vertigo   HYPOTHYROIDISM *Continue levothyroxine   HYPERTENSION *Continue amlodipine, clonidine, Imdur as well as beta blocker   Obstructive sleep apnea on nocturnal CPAP *We have ordered CPAP for hour of sleep use here  Disposition *Transfer to telemetry   LOS: 1 day   Junious Silk, ANP pager [phone]  [facility] 1 [email or web address] Password: [email or web address]  09/29/2011, 1:07 PM  I have examined the patient and reviewed the chart. I agree with the above note.   Calvert Cantor, MD [phone]

## 2011-09-29 NOTE — Progress Notes (Signed)
CRITICAL VALUE ALERT  Critical value received:  Gram positive cocci pairs and chains in 2 aerobic bottles  Date of notification:  09/29/2011  Time of notification:  1700  Critical value read back:yes  Nurse who received alert:  Maureen Ralphs, RN  MD notified (1st page):  Dr. Butler Denmark  Time of first page:  1700  MD notified (2nd page):  Time of second page:  Responding MD:  Dr. Butler Denmark  Time MD responded:  1800

## 2011-09-29 NOTE — Consult Note (Addendum)
Reason for Consult:  Elevated troponin Referring Physician:   [name] is an 66 y.o. female.  HPI:   The patient is a 66 year old obese Caucasian female with history of noncritical coronary artery disease cardiac catheterization in November 2010. Check cardiac catheterization in September 2012 which revealed normal coronary arteries and LV function. A 2-D echocardiogram which showed diastolic dysfunction. History also includes peripheral vascular obstructive disease with history of renal artery intervention and with reintervention iCAST covered stents for in-stent restenosis.  He also has subclavian disease.  History also includes difficult to control hypertension, pulmonary HTN, hyperlipidemia, insulin-dependent diabetes, chronic kidney disease, hypothyroidism, obstructive sleep apnea for which she uses a CPAP. Her last renal Dopplers revealed a stent to be widely patent.    The patient presented yesterday due to weakness, dizziness, chest pain, nausea, diarrhea.  She states the chest pain is similar to previous episodes at which time she had a heart cardiac catheterization.  Her initial troponin was 0.39 with a steadily climbed to 1.01. She appeased CK-MB of 11.9 and is now trending down.  EKG shows sinus rhythm at a rate of 66 beats per minute and with Q waves in V1 through V3.  No ischemic T-wave changes or ST changes.  Serum creatinine is 1.5 is decreased from 2.20 after hydration.    Past Medical History  Diagnosis Date  . HTN (hypertension)   . Heart attack   . CHF (congestive heart failure)   . Diabetes mellitus   . High cholesterol   . Kidney disease     Past Surgical History  Procedure Date  . Cardiac catheterization     Family History  Problem Relation Age of Onset  . Heart disease Mother   . Heart disease Father   . Heart disease Sister   . Heart disease Daughter     Social History:  reports that she quit smoking about 7 years ago. Her smoking use included  Cigarettes. She has a 25 pack-year smoking history. She does not have any smokeless tobacco history on file. She reports that she does not drink alcohol or use illicit drugs.  Allergies:  Allergies  Allergen Reactions  . Amlodipine Besylate     unknown  . Atenolol     unknown  . Bystolic (Nebivolol Hcl)     Cannot tolerate greater then 5 mg, causes chest pain  . Ciprofloxacin     unknown  . Codeine     swelling  . Lidocaine     Throat swelling  . Metformin     vomiting  . Metoprolol     unknown  . Penicillins     rash    Medications:    . amLODipine  7.5 mg Oral Daily  . aspirin  325 mg Oral Once  . cloNIDine  0.3 mg Transdermal Weekly  . clopidogrel  75 mg Oral Q breakfast  . escitalopram  20 mg Oral Daily  . heparin  4,000 Units Intravenous Once  . insulin aspart  0-9 Units Subcutaneous TID WC  . insulin glargine  62 Units Subcutaneous Daily  . isosorbide mononitrate  60 mg Oral Daily  . levothyroxine  112 mcg Oral Q breakfast  . nebivolol  2.5 mg Oral Daily  . omega-3 acid ethyl esters  2 g Oral BID  . ondansetron  8 mg Oral Once  . pantoprazole  40 mg Oral Daily  . sodium chloride  1,000 mL Intravenous Once  . sodium chloride  3 mL Intravenous  Q12H  . DISCONTD: aspirin EC  325 mg Oral Daily  . DISCONTD: aspirin EC  325 mg Oral Daily     Results for orders placed during the hospital encounter of 09/28/11 (from the past 48 hour(s))  CBC     Status: Abnormal   Collection Time   09/28/11  4:16 PM      Component Value Range Comment   WBC 23.8 (*) 4.0 - 10.5 (K/uL)    RBC 3.84 (*) 3.87 - 5.11 (MIL/uL)    Hemoglobin 11.8 (*) 12.0 - 15.0 (g/dL)    HCT 16.1 (*) 09.6 - 46.0 (%)    MCV 90.1  78.0 - 100.0 (fL)    MCH 30.7  26.0 - 34.0 (pg)    MCHC 34.1  30.0 - 36.0 (g/dL)    RDW 04.5  40.9 - 81.1 (%)    Platelets 260  150 - 400 (K/uL)   BASIC METABOLIC PANEL     Status: Abnormal   Collection Time   09/28/11  4:43 PM      Component Value Range Comment   Sodium  133 (*) 135 - 145 (mEq/L)    Potassium 5.6 (*) 3.5 - 5.1 (mEq/L)    Chloride 97  96 - 112 (mEq/L)    CO2 22  19 - 32 (mEq/L)    Glucose, Bld 107 (*) 70 - 99 (mg/dL)    BUN 57 (*) 6 - 23 (mg/dL)    Creatinine, Ser 9.14 (*) 0.50 - 1.10 (mg/dL)    Calcium 9.1  8.4 - 10.5 (mg/dL)    GFR calc non Af Amer 23 (*) >90 (mL/min)    GFR calc Af Amer 26 (*) >90 (mL/min)   LACTIC ACID, PLASMA     Status: Normal   Collection Time   09/28/11  7:00 PM      Component Value Range Comment   Lactic Acid, Venous 1.1  0.5 - 2.2 (mmol/L)   TROPONIN I     Status: Abnormal   Collection Time   09/28/11  7:01 PM      Component Value Range Comment   Troponin I 0.39 (*) <0.30 (ng/mL)   URINALYSIS, ROUTINE W REFLEX MICROSCOPIC     Status: Abnormal   Collection Time   09/28/11  7:51 PM      Component Value Range Comment   Color, Urine YELLOW  YELLOW     APPearance CLEAR  CLEAR     Specific Gravity, Urine 1.012  1.005 - 1.030     pH 5.5  5.0 - 8.0     Glucose, UA NEGATIVE  NEGATIVE (mg/dL)    Hgb urine dipstick NEGATIVE  NEGATIVE     Bilirubin Urine NEGATIVE  NEGATIVE     Ketones, ur NEGATIVE  NEGATIVE (mg/dL)    Protein, ur NEGATIVE  NEGATIVE (mg/dL)    Urobilinogen, UA 0.2  0.0 - 1.0 (mg/dL)    Nitrite NEGATIVE  NEGATIVE     Leukocytes, UA TRACE (*) NEGATIVE    URINE MICROSCOPIC-ADD ON     Status: Normal   Collection Time   09/28/11  7:51 PM      Component Value Range Comment   Squamous Epithelial / LPF RARE  RARE     WBC, UA 3-6  <3 (WBC/hpf)    RBC / HPF 0-2  <3 (RBC/hpf)    Bacteria, UA RARE  RARE    HEPATIC FUNCTION PANEL     Status: Abnormal   Collection Time   09/28/11  9:04 PM      Component Value Range Comment   Total Protein 7.3  6.0 - 8.3 (g/dL)    Albumin 3.1 (*) 3.5 - 5.2 (g/dL)    AST 19  0 - 37 (U/L)    ALT 24  0 - 35 (U/L)    Alkaline Phosphatase 92  39 - 117 (U/L)    Total Bilirubin 0.4  0.3 - 1.2 (mg/dL)    Bilirubin, Direct 0.1  0.0 - 0.3 (mg/dL)    Indirect Bilirubin 0.3  0.3 - 0.9  (mg/dL)   CARDIAC PANEL(CRET KIN+CKTOT+MB+TROPI)     Status: Abnormal   Collection Time   09/28/11  9:38 PM      Component Value Range Comment   Total CK 289 (*) 7 - 177 (U/L)    CK, MB 11.9 (*) 0.3 - 4.0 (ng/mL)    Troponin I 0.69 (*) <0.30 (ng/mL)    Relative Index 4.1 (*) 0.0 - 2.5    COMPREHENSIVE METABOLIC PANEL     Status: Abnormal   Collection Time   09/28/11  9:39 PM      Component Value Range Comment   Sodium 133 (*) 135 - 145 (mEq/L)    Potassium 4.8  3.5 - 5.1 (mEq/L)    Chloride 102  96 - 112 (mEq/L)    CO2 20  19 - 32 (mEq/L)    Glucose, Bld 202 (*) 70 - 99 (mg/dL)    BUN 61 (*) 6 - 23 (mg/dL)    Creatinine, Ser 7.82 (*) 0.50 - 1.10 (mg/dL)    Calcium 8.5  8.4 - 10.5 (mg/dL)    Total Protein 6.5  6.0 - 8.3 (g/dL)    Albumin 2.8 (*) 3.5 - 5.2 (g/dL)    AST 15  0 - 37 (U/L)    ALT 21  0 - 35 (U/L)    Alkaline Phosphatase 79  39 - 117 (U/L)    Total Bilirubin 0.3  0.3 - 1.2 (mg/dL)    GFR calc non Af Amer 23 (*) >90 (mL/min)    GFR calc Af Amer 27 (*) >90 (mL/min)   PROCALCITONIN     Status: Normal   Collection Time   09/28/11  9:43 PM      Component Value Range Comment   Procalcitonin 0.68     GLUCOSE, CAPILLARY     Status: Abnormal   Collection Time   09/28/11 10:58 PM      Component Value Range Comment   Glucose-Capillary 188 (*) 70 - 99 (mg/dL)   CARDIAC PANEL(CRET KIN+CKTOT+MB+TROPI)     Status: Abnormal   Collection Time   09/29/11  1:37 AM      Component Value Range Comment   Total CK 280 (*) 7 - 177 (U/L)    CK, MB 11.4 (*) 0.3 - 4.0 (ng/mL) CRITICAL VALUE NOTED.  VALUE IS CONSISTENT WITH PREVIOUSLY REPORTED AND CALLED VALUE.   Troponin I 1.01 (*) <0.30 (ng/mL)    Relative Index 4.1 (*) 0.0 - 2.5    SEDIMENTATION RATE     Status: Abnormal   Collection Time   09/29/11  1:59 AM      Component Value Range Comment   Sed Rate 68 (*) 0 - 22 (mm/hr)   CBC     Status: Abnormal   Collection Time   09/29/11  1:59 AM      Component Value Range Comment   WBC 15.5 (*)  4.0 - 10.5 (K/uL)    RBC 3.09 (*)  3.87 - 5.11 (MIL/uL)    Hemoglobin 9.8 (*) 12.0 - 15.0 (g/dL)    HCT 57.8 (*) 46.9 - 46.0 (%)    MCV 90.6  78.0 - 100.0 (fL)    MCH 31.7  26.0 - 34.0 (pg)    MCHC 35.0  30.0 - 36.0 (g/dL)    RDW 62.9  52.8 - 41.3 (%)    Platelets 195  150 - 400 (K/uL)   DIFFERENTIAL     Status: Abnormal   Collection Time   09/29/11  1:59 AM      Component Value Range Comment   Neutrophils Relative 78 (*) 43 - 77 (%)    Neutro Abs 12.1 (*) 1.7 - 7.7 (K/uL)    Lymphocytes Relative 12  12 - 46 (%)    Lymphs Abs 1.9  0.7 - 4.0 (K/uL)    Monocytes Relative 9  3 - 12 (%)    Monocytes Absolute 1.4 (*) 0.1 - 1.0 (K/uL)    Eosinophils Relative 0  0 - 5 (%)    Eosinophils Absolute 0.0  0.0 - 0.7 (K/uL)    Basophils Relative 0  0 - 1 (%)    Basophils Absolute 0.0  0.0 - 0.1 (K/uL)   LACTIC ACID, PLASMA     Status: Normal   Collection Time   09/29/11  1:59 AM      Component Value Range Comment   Lactic Acid, Venous 0.8  0.5 - 2.2 (mmol/L)   COMPREHENSIVE METABOLIC PANEL     Status: Abnormal   Collection Time   09/29/11  3:13 AM      Component Value Range Comment   Sodium 133 (*) 135 - 145 (mEq/L)    Potassium 4.4  3.5 - 5.1 (mEq/L)    Chloride 101  96 - 112 (mEq/L)    CO2 20  19 - 32 (mEq/L)    Glucose, Bld 140 (*) 70 - 99 (mg/dL)    BUN 57 (*) 6 - 23 (mg/dL)    Creatinine, Ser 2.44 (*) 0.50 - 1.10 (mg/dL)    Calcium 8.5  8.4 - 10.5 (mg/dL)    Total Protein 6.3  6.0 - 8.3 (g/dL)    Albumin 2.7 (*) 3.5 - 5.2 (g/dL)    AST 15  0 - 37 (U/L)    ALT 21  0 - 35 (U/L)    Alkaline Phosphatase 72  39 - 117 (U/L)    Total Bilirubin 0.3  0.3 - 1.2 (mg/dL)    GFR calc non Af Amer 25 (*) >90 (mL/min)    GFR calc Af Amer 29 (*) >90 (mL/min)   LACTATE DEHYDROGENASE     Status: Abnormal   Collection Time   09/29/11  3:13 AM      Component Value Range Comment   LDH 257 (*) 94 - 250 (U/L)   MRSA PCR SCREENING     Status: Normal   Collection Time   09/29/11  5:26 AM      Component  Value Range Comment   MRSA by PCR NEGATIVE  NEGATIVE    COMPREHENSIVE METABOLIC PANEL     Status: Abnormal   Collection Time   09/29/11  7:00 AM      Component Value Range Comment   Sodium 135  135 - 145 (mEq/L)    Potassium 4.1  3.5 - 5.1 (mEq/L)    Chloride 102  96 - 112 (mEq/L)    CO2 18 (*) 19 - 32 (mEq/L)    Glucose,  Bld 126 (*) 70 - 99 (mg/dL)    BUN 53 (*) 6 - 23 (mg/dL)    Creatinine, Ser 1.61 (*) 0.50 - 1.10 (mg/dL)    Calcium 8.6  8.4 - 10.5 (mg/dL)    Total Protein 6.3  6.0 - 8.3 (g/dL)    Albumin 2.6 (*) 3.5 - 5.2 (g/dL)    AST 15  0 - 37 (U/L)    ALT 20  0 - 35 (U/L)    Alkaline Phosphatase 68  39 - 117 (U/L)    Total Bilirubin 0.4  0.3 - 1.2 (mg/dL)    GFR calc non Af Amer 28 (*) >90 (mL/min)    GFR calc Af Amer 32 (*) >90 (mL/min)   CBC     Status: Abnormal   Collection Time   09/29/11  7:00 AM      Component Value Range Comment   WBC 13.5 (*) 4.0 - 10.5 (K/uL)    RBC 3.06 (*) 3.87 - 5.11 (MIL/uL)    Hemoglobin 9.4 (*) 12.0 - 15.0 (g/dL)    HCT 09.6 (*) 04.5 - 46.0 (%)    MCV 89.9  78.0 - 100.0 (fL)    MCH 30.7  26.0 - 34.0 (pg)    MCHC 34.2  30.0 - 36.0 (g/dL)    RDW 40.9  81.1 - 91.4 (%)    Platelets 189  150 - 400 (K/uL)   GLUCOSE, CAPILLARY     Status: Abnormal   Collection Time   09/29/11  8:33 AM      Component Value Range Comment   Glucose-Capillary 120 (*) 70 - 99 (mg/dL)    Comment 1 Notify RN       Dg Chest 2 View  09/28/2011  *RADIOLOGY REPORT*  Clinical Data: Leukocytosis.  Weakness.  Tachycardia.  CHEST - 2 VIEW  Comparison: 09/27/2011  Findings: Lung volumes are low. Cardiopericardial silhouette is at upper limits of normal for size. Interstitial markings are diffusely coarsened with chronic features.  No edema or focal airspace consolidation.  No pleural effusion. Imaged bony structures of the thorax are intact.  IMPRESSION: Low volume film with borderline cardiomegaly underlying chronic interstitial coarsening.  No interval change.  Original Report  Authenticated By: ERIC A. MANSELL, M.D.   Dg Chest 2 View  09/27/2011  *RADIOLOGY REPORT*  Clinical Data: Chest discomfort.  CHEST - 2 VIEW  Comparison: 02/10/2011  Findings: The lungs are clear without focal infiltrate, edema, pneumothorax or pleural effusion. Interstitial markings are diffusely coarsened with chronic features.  Cardiopericardial silhouette is at upper limits of normal for size. Imaged bony structures of the thorax are intact.  IMPRESSION: Stable.  No acute findings.  Original Report Authenticated By: ERIC A. MANSELL, M.D.   Ct Head Wo Contrast  09/27/2011  *RADIOLOGY REPORT*  Clinical Data: Nausea, vomiting, diarrhea for 24 hours.  Weakness, numbness and parasthesias of all four extremities.  Headache.  CT HEAD WITHOUT CONTRAST  Technique:  Contiguous axial images were obtained from the base of the skull through the vertex without contrast.  Comparison: 05/26/2006.  Findings: No mass lesion, mass effect, midline shift, hydrocephalus, hemorrhage.  No acute territorial cortical ischemia/infarct. Atrophy and chronic ischemic white matter disease is present.  Paranasal sinuses appear normal.  Mastoid air cells are within normal limits.  Intracranial atherosclerosis.  IMPRESSION: Mild atrophy and chronic ischemic white matter disease without acute intracranial abnormality.  Original Report Authenticated By: Andreas Newport, M.D.    Review of Systems  Constitutional: Negative for fever  and diaphoresis.  HENT: Negative for congestion and sore throat.   Eyes: Negative for blurred vision and double vision.  Respiratory: Positive for shortness of breath. Negative for cough.   Cardiovascular: Positive for chest pain (currently resolved). Negative for palpitations, orthopnea, leg swelling and PND.  Gastrointestinal: Positive for nausea and abdominal pain. Negative for vomiting, constipation, blood in stool and melena. Diarrhea: Suprapubic.  Musculoskeletal:       No radiation of chest pain    Neurological: Positive for dizziness and weakness.   Blood pressure 130/35, pulse 58, temperature 98 F (36.7 C), temperature source Oral, resp. rate 20, height 5\' 3"  (1.6 m), weight 85.3 kg (188 lb 0.8 oz), SpO2 94.00%. Physical Exam  Constitutional: No distress.       Obese Caucasian female  HENT:  Head: Normocephalic and atraumatic.  Eyes: EOM are normal. Pupils are equal, round, and reactive to light. No scleral icterus.  Neck: Normal range of motion. Neck supple.  Cardiovascular: Normal rate, regular rhythm, S1 normal and S2 normal.   Pulses:      Radial pulses are 2+ on the right side, and 2+ on the left side.       Dorsalis pedis pulses are 2+ on the right side, and 2+ on the left side.       No carotid bruits  Lymphadenopathy:    She has no cervical adenopathy.    Assessment/Plan: Patient Active Hospital Problem List: Elevated troponin (09/29/2011) Weakness (09/29/2011) HYPOTHYROIDISM (06/19/2008) HYPERLIPIDEMIA (06/19/2008) HYPERTENSION (06/19/2008) COPD UNSPECIFIED (07/22/2008) VERTIGO (06/19/2008) SLEEP APNEA (06/20/2008) Leukocytosis (09/29/2011) Acute renal failure (09/29/2011) Pulmonary HTN (09/29/2011) Sinus bradycardia  Plan:  66 year old obese female with history of peripheral vascular obstructive disease, normal coronary arteries and ejection fraction of 50-55% by cardiac catheterization in September of 2012 presented yesterday with chest pain, weakness, nausea, dizziness and now has elevated troponin 1.01.  She is currently chest pain free.  She has a slightly elevated white count of 13.5.  Creatinine is improved to 1.85 with hydration.  Urinalysis showed trace leukocytes and rare bacteria.  Blood pressure and heart rate are controlled and stable.  Monitor fluid I/O.  Plan for 2D Echo and Lexiscan myoview.   HAGER,BRYAN W 09/29/2011, 8:54 AM   I have seen and examined the patient along with Dwana Melena, PA.  I have reviewed the chart, notes and new data.  I agree with  PA's note.  Key new complaints: weakness; chest pain is not a major complaint and now she is not really sure that she did hurt. No orthopnea. Does have chronic exertional dyspnea. Key examination changes: holosystolic murmur of TR Key new findings / data: note severe diastolic dysfunction by previous echo and cath, but also disproportionately severe pulmonary artery hypertension   PLAN: Consider small RV infarction/demand injury. Unlikely this is true acute coronary syndrome with coronary angio as reported just last September. Reevaluate echo. Consider empirical warfarin anticoagulation, indicated regardless of etiology when PAH is severe. Avoid contrast based procedures and diuretics while renal function is recovering. Consider repeat right and left heart cath depending on echo results.  Thurmon Fair, MD, [facility] [phone] 09/29/2011, 9:44 AM  Discussion with Dr. Marchelle Gearing was very helpful. He pointed out that there are extensive notes from [facility] workup in the "Media" section re: PAH. Indeed, repeat right heart cath seems to support diastolic heart failure as a major cause of her PAH. Apparently, she has also been positively diagnosed with polymyositis.  May still  need right heart cath for optimization of preload in what is becoming a fragile balance between decompensated HF/pulm HTH and reduced cardiac output with renal failure. Will wait for echo.  Thurmon Fair, MD, [facility] [phone] office [phone] pager 10:12 AM

## 2011-09-29 NOTE — H&P (Addendum)
[name] is an 66 y.o. female.   PCP - Dr.Mcinnis in [place]. Chief Complaint: Weakness. HPI: 66 year-old female with known history of diabetes mellitus2, hypertension, hypothyroidism, renal artery stenosis status post stent placement and chronic shortness of breath presented to the ER because of weakness. Patient has been feeling weak over the last 3-4 days with feeling dizzy whenever she stands up and having nausea vomiting and diarrhea. She had at least 3 episodes each day and her diarrhea has stopped. Patient got her PCP and as the patient is to start on steroids. Despite this patient is to very weak and came to the ER. Patient also has subjective feeling of fever chills. In the ER patient was found to have leukocytosis and chest x-ray and UA did not show any definite source for infection. Patient's cardiac enzymes eventually has been positive but EKG shows poor R-wave progression with no definite ST elevation and patient has no chest pain. Patient's creatinine is also increased from her baseline. At this time patient has been admitted for further workup.  Past Medical History  Diagnosis Date  . HTN (hypertension)   . Heart attack   . CHF (congestive heart failure)   . Diabetes mellitus   . High cholesterol   . Kidney disease     Past Surgical History  Procedure Date  . Cardiac catheterization     Family History  Problem Relation Age of Onset  . Heart disease Mother   . Heart disease Father   . Heart disease Sister   . Heart disease Daughter    Social History:  reports that she quit smoking about 7 years ago. Her smoking use included Cigarettes. She has a 25 pack-year smoking history. She does not have any smokeless tobacco history on file. She reports that she does not drink alcohol or use illicit drugs.  Allergies:  Allergies  Allergen Reactions  . Amlodipine Besylate     unknown  . Atenolol     unknown  . Bystolic (Nebivolol Hcl)     Cannot tolerate greater then  5 mg, causes chest pain  . Ciprofloxacin     unknown  . Codeine     swelling  . Lidocaine     Throat swelling  . Metformin     vomiting  . Metoprolol     unknown  . Penicillins     rash    Medications Prior to Admission  Medication Sig Dispense Refill  . ALPRAZolam (XANAX) 0.5 MG tablet Take 0.5 mg by mouth at bedtime as needed. For anxiety      . amLODipine (NORVASC) 5 MG tablet Take 7.5 mg by mouth daily.       Marland Kitchen aspirin 81 MG tablet Take 81 mg by mouth daily.        . Cholecalciferol (VITAMIN D3) 2000 UNITS TABS Take 1 tablet by mouth at bedtime.        . cloNIDine (CATAPRES - DOSED IN MG/24 HR) 0.3 mg/24hr Place 1 patch onto the skin once a week. Take on mondays      . clopidogrel (PLAVIX) 75 MG tablet Take 75 mg by mouth daily.        Marland Kitchen escitalopram (LEXAPRO) 20 MG tablet Take 20 mg by mouth daily.        . furosemide (LASIX) 80 MG tablet Take 80 mg by mouth 2 (two) times daily.        . insulin glargine (LANTUS) 100 UNIT/ML injection Inject 62 Units into  the skin daily.       . insulin lispro (HUMALOG) 100 UNIT/ML injection 10 units at breakfast, 10 at lunch, and 20 at dinner       . isosorbide mononitrate (IMDUR) 60 MG 24 hr tablet Take 60 mg by mouth daily.        Marland Kitchen levothyroxine (SYNTHROID, LEVOTHROID) 112 MCG tablet Take 112 mcg by mouth daily.        . nebivolol (BYSTOLIC) 2.5 MG tablet Take 2.5 mg by mouth daily.        Marland Kitchen NEXIUM 40 MG capsule TAKE ONE CAPSULE DAILY AS NEEDED FOR REFLUX  30 capsule  0  . omega-3 acid ethyl esters (LOVAZA) 1 G capsule Take 2 g by mouth 2 (two) times daily.      Marland Kitchen oxyCODONE-acetaminophen (PERCOCET) 5-325 MG per tablet Take 2 tablets by mouth every 8 (eight) hours as needed for pain.  15 tablet  0  . potassium chloride SA (K-DUR,KLOR-CON) 20 MEQ tablet Take 20 mEq by mouth 2 (two) times daily.        . valsartan (DIOVAN) 160 MG tablet Take 160 mg by mouth 2 (two) times daily.        Results for orders placed during the hospital encounter  of 09/28/11 (from the past 48 hour(s))  CBC     Status: Abnormal   Collection Time   09/28/11  4:16 PM      Component Value Range Comment   WBC 23.8 (*) 4.0 - 10.5 (K/uL)    RBC 3.84 (*) 3.87 - 5.11 (MIL/uL)    Hemoglobin 11.8 (*) 12.0 - 15.0 (g/dL)    HCT 16.1 (*) 09.6 - 46.0 (%)    MCV 90.1  78.0 - 100.0 (fL)    MCH 30.7  26.0 - 34.0 (pg)    MCHC 34.1  30.0 - 36.0 (g/dL)    RDW 04.5  40.9 - 81.1 (%)    Platelets 260  150 - 400 (K/uL)   BASIC METABOLIC PANEL     Status: Abnormal   Collection Time   09/28/11  4:43 PM      Component Value Range Comment   Sodium 133 (*) 135 - 145 (mEq/L)    Potassium 5.6 (*) 3.5 - 5.1 (mEq/L)    Chloride 97  96 - 112 (mEq/L)    CO2 22  19 - 32 (mEq/L)    Glucose, Bld 107 (*) 70 - 99 (mg/dL)    BUN 57 (*) 6 - 23 (mg/dL)    Creatinine, Ser 9.14 (*) 0.50 - 1.10 (mg/dL)    Calcium 9.1  8.4 - 10.5 (mg/dL)    GFR calc non Af Amer 23 (*) >90 (mL/min)    GFR calc Af Amer 26 (*) >90 (mL/min)   LACTIC ACID, PLASMA     Status: Normal   Collection Time   09/28/11  7:00 PM      Component Value Range Comment   Lactic Acid, Venous 1.1  0.5 - 2.2 (mmol/L)   TROPONIN I     Status: Abnormal   Collection Time   09/28/11  7:01 PM      Component Value Range Comment   Troponin I 0.39 (*) <0.30 (ng/mL)   URINALYSIS, ROUTINE W REFLEX MICROSCOPIC     Status: Abnormal   Collection Time   09/28/11  7:51 PM      Component Value Range Comment   Color, Urine YELLOW  YELLOW     APPearance CLEAR  CLEAR  Specific Gravity, Urine 1.012  1.005 - 1.030     pH 5.5  5.0 - 8.0     Glucose, UA NEGATIVE  NEGATIVE (mg/dL)    Hgb urine dipstick NEGATIVE  NEGATIVE     Bilirubin Urine NEGATIVE  NEGATIVE     Ketones, ur NEGATIVE  NEGATIVE (mg/dL)    Protein, ur NEGATIVE  NEGATIVE (mg/dL)    Urobilinogen, UA 0.2  0.0 - 1.0 (mg/dL)    Nitrite NEGATIVE  NEGATIVE     Leukocytes, UA TRACE (*) NEGATIVE    URINE MICROSCOPIC-ADD ON     Status: Normal   Collection Time   09/28/11  7:51 PM       Component Value Range Comment   Squamous Epithelial / LPF RARE  RARE     WBC, UA 3-6  <3 (WBC/hpf)    RBC / HPF 0-2  <3 (RBC/hpf)    Bacteria, UA RARE  RARE    HEPATIC FUNCTION PANEL     Status: Abnormal   Collection Time   09/28/11  9:04 PM      Component Value Range Comment   Total Protein 7.3  6.0 - 8.3 (g/dL)    Albumin 3.1 (*) 3.5 - 5.2 (g/dL)    AST 19  0 - 37 (U/L)    ALT 24  0 - 35 (U/L)    Alkaline Phosphatase 92  39 - 117 (U/L)    Total Bilirubin 0.4  0.3 - 1.2 (mg/dL)    Bilirubin, Direct 0.1  0.0 - 0.3 (mg/dL)    Indirect Bilirubin 0.3  0.3 - 0.9 (mg/dL)   CARDIAC PANEL(CRET KIN+CKTOT+MB+TROPI)     Status: Abnormal   Collection Time   09/28/11  9:38 PM      Component Value Range Comment   Total CK 289 (*) 7 - 177 (U/L)    CK, MB 11.9 (*) 0.3 - 4.0 (ng/mL)    Troponin I 0.69 (*) <0.30 (ng/mL)    Relative Index 4.1 (*) 0.0 - 2.5    COMPREHENSIVE METABOLIC PANEL     Status: Abnormal   Collection Time   09/28/11  9:39 PM      Component Value Range Comment   Sodium 133 (*) 135 - 145 (mEq/L)    Potassium 4.8  3.5 - 5.1 (mEq/L)    Chloride 102  96 - 112 (mEq/L)    CO2 20  19 - 32 (mEq/L)    Glucose, Bld 202 (*) 70 - 99 (mg/dL)    BUN 61 (*) 6 - 23 (mg/dL)    Creatinine, Ser 1.61 (*) 0.50 - 1.10 (mg/dL)    Calcium 8.5  8.4 - 10.5 (mg/dL)    Total Protein 6.5  6.0 - 8.3 (g/dL)    Albumin 2.8 (*) 3.5 - 5.2 (g/dL)    AST 15  0 - 37 (U/L)    ALT 21  0 - 35 (U/L)    Alkaline Phosphatase 79  39 - 117 (U/L)    Total Bilirubin 0.3  0.3 - 1.2 (mg/dL)    GFR calc non Af Amer 23 (*) >90 (mL/min)    GFR calc Af Amer 27 (*) >90 (mL/min)   PROCALCITONIN     Status: Normal   Collection Time   09/28/11  9:43 PM      Component Value Range Comment   Procalcitonin 0.68      Dg Chest 2 View  09/28/2011  *RADIOLOGY REPORT*  Clinical Data: Leukocytosis.  Weakness.  Tachycardia.  CHEST -  2 VIEW  Comparison: 09/27/2011  Findings: Lung volumes are low. Cardiopericardial silhouette is at upper  limits of normal for size. Interstitial markings are diffusely coarsened with chronic features.  No edema or focal airspace consolidation.  No pleural effusion. Imaged bony structures of the thorax are intact.  IMPRESSION: Low volume film with borderline cardiomegaly underlying chronic interstitial coarsening.  No interval change.  Original Report Authenticated By: ERIC A. MANSELL, M.D.   Dg Chest 2 View  09/27/2011  *RADIOLOGY REPORT*  Clinical Data: Chest discomfort.  CHEST - 2 VIEW  Comparison: 02/10/2011  Findings: The lungs are clear without focal infiltrate, edema, pneumothorax or pleural effusion. Interstitial markings are diffusely coarsened with chronic features.  Cardiopericardial silhouette is at upper limits of normal for size. Imaged bony structures of the thorax are intact.  IMPRESSION: Stable.  No acute findings.  Original Report Authenticated By: ERIC A. MANSELL, M.D.   Ct Head Wo Contrast  09/27/2011  *RADIOLOGY REPORT*  Clinical Data: Nausea, vomiting, diarrhea for 24 hours.  Weakness, numbness and parasthesias of all four extremities.  Headache.  CT HEAD WITHOUT CONTRAST  Technique:  Contiguous axial images were obtained from the base of the skull through the vertex without contrast.  Comparison: 05/26/2006.  Findings: No mass lesion, mass effect, midline shift, hydrocephalus, hemorrhage.  No acute territorial cortical ischemia/infarct. Atrophy and chronic ischemic white matter disease is present.  Paranasal sinuses appear normal.  Mastoid air cells are within normal limits.  Intracranial atherosclerosis.  IMPRESSION: Mild atrophy and chronic ischemic white matter disease without acute intracranial abnormality.  Original Report Authenticated By: Andreas Newport, M.D.    Review of Systems  Constitutional: Positive for malaise/fatigue.  HENT: Negative.   Eyes: Negative.   Respiratory: Negative.   Cardiovascular: Negative.   Gastrointestinal: Positive for nausea, vomiting and diarrhea.    Genitourinary: Negative.   Musculoskeletal: Negative.   Skin: Positive for rash.  Neurological: Positive for dizziness and weakness.  Endo/Heme/Allergies: Negative.   Psychiatric/Behavioral: Negative.     Blood pressure 119/52, pulse 66, temperature 101 F (38.3 C), temperature source Oral, resp. rate 19, weight 83.8 kg (184 lb 11.9 oz), SpO2 100.00%. Physical Exam  Constitutional: She is oriented to person, place, and time. She appears well-developed and well-nourished. No distress.  HENT:  Head: Normocephalic and atraumatic.  Right Ear: External ear normal.  Left Ear: External ear normal.  Nose: Nose normal.  Mouth/Throat: Oropharynx is clear and moist. No oropharyngeal exudate.  Eyes: Conjunctivae are normal. Pupils are equal, round, and reactive to light. Right eye exhibits no discharge. Left eye exhibits no discharge. No scleral icterus.  Neck: Normal range of motion. Neck supple.  Cardiovascular: Normal rate and regular rhythm.   Respiratory: Effort normal and breath sounds normal. No respiratory distress. She has no wheezes. She has no rales.  GI: Soft. Bowel sounds are normal. She exhibits no distension. There is no tenderness. There is no rebound.  Neurological: She is alert and oriented to person, place, and time.       Moves all extremities. No facial asymmetry. Tongue is midline.Poor reflexes.  Skin: Skin is warm and dry. Rash (Rash on the face.) noted. She is not diaphoretic.  Psychiatric: Her behavior is normal.     Assessment/Plan #1. Generalized weakness with significant leukocytosis and acute renal failure with positive troponins - do not know the exact cause for her leukocytosis. Patient is developing mild fever after admission. We will get blood cultures, urine cultures check sedimentation rate and recheck  lactic acid levels. Since patient also was complaining of nausea vomiting and diarrhea we will also check stool for C. difficile and recheck LFTs. Patient's  abdomen exam is benign. Closely follow CBC to see the trend for her leukocytosis. #2. Positive cardiac enzymes -  Patient is chest pain-free and EKG shows poor progression no definite ST elevations. I will be discussing the EKG with on-call cardiologist. Cardiac catheterization done in November 2012 was normal. We will cycle cardiac markers. Continue Plavix and aspirin. Also list [facility] for consult. #3. Acute renal failure probably from dehydration - hold Diovan and Lasix for now. UA does not show any cast. Gently hydrate and recheck metabolic panel and closely follow intake output. Patient does have history of renal artery stenosis status post stenting #4. Diabetes mellitus2 - continue home medications. #5. Hypertension - hold Diovan because of renal failure. Continue other medications and will place patient on when necessary IV hydralazine for systolic blood pressure more than 160. #6. Anemia - follow CBC. #7. Hypothyroidism - check TSH. Continue Synthroid. #8. Family history of ALS.  CODE STATUS - full code.  Asja Frommer N. 09/29/2011, 1:55 AM Addendum - since patient's cardiac enzymes show an increasing trend I have consulted [facility]. I have also started patient on IV heparin infusion. We'll transfer to step down.

## 2011-09-29 NOTE — Progress Notes (Signed)
ANTIBIOTIC CONSULT NOTE - INITIAL  Pharmacy Consult for Vancomycin Indication: Bacteremia   Allergies  Allergen Reactions  . Amlodipine Besylate     unknown  . Atenolol     unknown  . Bystolic (Nebivolol Hcl)     Cannot tolerate greater then 5 mg, causes chest pain  . Ciprofloxacin     unknown  . Codeine     swelling  . Lidocaine     Throat swelling  . Metformin     vomiting  . Metoprolol     unknown  . Penicillins     rash    Patient Measurements: Height: 5\' 3"  (160 cm) Weight: 188 lb 0.8 oz (85.3 kg) IBW/kg (Calculated) : 52.4   Vital Signs: Temp: 97.8 F (36.6 C) (05/09 1644) Temp src: Oral (05/09 1644) BP: 134/39 mmHg (05/09 1644) Pulse Rate: 53  (05/09 1644) Intake/Output from previous day: 05/08 0701 - 05/09 0700 In: 330 [I.V.:330] Out: 200 [Urine:200] Intake/Output from this shift:    Labs:  Basename 09/29/11 0700 09/29/11 0313 09/29/11 0159 09/28/11 2139 09/28/11 1616  WBC 13.5* -- 15.5* -- 23.8*  HGB 9.4* -- 9.8* -- 11.8*  PLT 189 -- 195 -- 260  LABCREA -- -- -- -- --  CREATININE 1.85* 2.00* -- 2.15* --   Estimated Creatinine Clearance: 32.2 ml/min (by C-G formula based on Cr of 1.85). No results found for this basename: VANCOTROUGH:2,VANCOPEAK:2,VANCORANDOM:2,GENTTROUGH:2,GENTPEAK:2,GENTRANDOM:2,TOBRATROUGH:2,TOBRAPEAK:2,TOBRARND:2,AMIKACINPEAK:2,AMIKACINTROU:2,AMIKACIN:2, in the last 72 hours   Microbiology: Recent Results (from the past 720 hour(s))  CULTURE, BLOOD (ROUTINE X 2)     Status: Normal (Preliminary result)   Collection Time   09/28/11  6:48 PM      Component Value Range Status Comment   Specimen Description BLOOD ARM LEFT   Final    Special Requests BOTTLES DRAWN AEROBIC ONLY 10CC   Final    Culture  Setup Time [id number]   Final    Culture     Final    Value: GRAM POSITIVE COCCI IN PAIRS AND CHAINS     Note: Gram Stain Report Called to,Read Back By and Verified With: LINDA [facility] @ 1806 ON 09/29/11 BY GOLLD   Report Status  PENDING   Incomplete   CULTURE, BLOOD (ROUTINE X 2)     Status: Normal (Preliminary result)   Collection Time   09/28/11  7:01 PM      Component Value Range Status Comment   Specimen Description BLOOD ARM RIGHT   Final    Special Requests BOTTLES DRAWN AEROBIC ONLY 10CC   Final    Culture  Setup Time [id number]   Final    Culture     Final    Value: GRAM POSITIVE COCCI IN PAIRS AND CHAINS     Note: Gram Stain Report Called to,Read Back By and Verified With: LINDA [facility] @ 1806 ON 09/29/11 BY GOLLD   Report Status PENDING   Incomplete   MRSA PCR SCREENING     Status: Normal   Collection Time   09/29/11  5:26 AM      Component Value Range Status Comment   MRSA by PCR NEGATIVE  NEGATIVE  Final     Medical History: Past Medical History  Diagnosis Date  . HTN (hypertension)   . Heart attack   . CHF (congestive heart failure)   . Diabetes mellitus   . High cholesterol   . Kidney disease     Medications:  Anti-infectives     Start     Dose/Rate Route Frequency  Ordered Stop   09/29/11 1915   cefTRIAXone (ROCEPHIN) 1 g in dextrose 5 % 50 mL IVPB  Status:  Discontinued        1 g 100 mL/hr over 30 Minutes Intravenous Every 24 hours 09/29/11 1909 09/29/11 2010         Assessment: Bacteremia:  To begin empiric therapy with Vancomycin.  Options for treatment are limited due to extensive allergy history.  Note she has acute on chronic renal dysfunction.  Goal of Therapy:  Vancomycin trough level 10-15 mcg/ml  Plan:  Start Vancomycin 1gm IV q24h F/U ID and susceptibilities of blood cultures Monitor renal function closely as regimen may require additional adjustments.  Estella Husk, Pharm.D., BCPS Clinical Pharmacist  Pager [phone] 09/29/2011, 8:25 PM

## 2011-09-29 NOTE — Progress Notes (Signed)
ANTICOAGULATION CONSULT NOTE - Initial Consult  Pharmacy Consult for Heparin Indication: chest pain/ACS  Allergies  Allergen Reactions  . Amlodipine Besylate     unknown  . Atenolol     unknown  . Bystolic (Nebivolol Hcl)     Cannot tolerate greater then 5 mg, causes chest pain  . Ciprofloxacin     unknown  . Codeine     swelling  . Lidocaine     Throat swelling  . Metformin     vomiting  . Metoprolol     unknown  . Penicillins     rash    Patient Measurements: Height: 5\' 3"  (160 cm) Weight: 188 lb 0.8 oz (85.3 kg) IBW/kg (Calculated) : 52.4   Vital Signs: Temp: 97.6 F (36.4 C) (05/09 1200) Temp src: Oral (05/09 1200) BP: 150/39 mmHg (05/09 1200) Pulse Rate: 58  (05/09 0753)  Labs:  Basename 09/29/11 1206 09/29/11 0700 09/29/11 0313 09/29/11 0159 09/29/11 0137 09/28/11 2139 09/28/11 2138 09/28/11 1901 09/28/11 1616 09/27/11 1853  HGB -- 9.4* -- 9.8* -- -- -- -- -- --  HCT -- 27.5* -- 28.0* -- -- -- -- 34.6* --  PLT -- 189 -- 195 -- -- -- -- 260 --  APTT -- -- -- -- -- -- -- -- -- --  LABPROT -- -- -- -- -- -- -- -- -- --  INR -- -- -- -- -- -- -- -- -- --  HEPARINUNFRC 0.12* -- -- -- -- -- -- -- -- --  CREATININE -- 1.85* 2.00* -- -- 2.15* -- -- -- --  CKTOTAL -- -- -- -- 280* -- 289* -- -- 415*  CKMB -- -- -- -- 11.4* -- 11.9* -- -- --  TROPONINI -- -- -- -- 1.01* -- 0.69* 0.39* -- --    Estimated Creatinine Clearance: 32.2 ml/min (by C-G formula based on Cr of 1.85).   Medical History: Past Medical History  Diagnosis Date  . HTN (hypertension)   . Heart attack   . CHF (congestive heart failure)   . Diabetes mellitus   . High cholesterol   . Kidney disease     Medications:  Prescriptions prior to admission  Medication Sig Dispense Refill  . ALPRAZolam (XANAX) 0.5 MG tablet Take 0.5 mg by mouth at bedtime as needed. For anxiety      . amLODipine (NORVASC) 5 MG tablet Take 7.5 mg by mouth daily.       Marland Kitchen aspirin 81 MG tablet Take 81 mg by mouth  daily.        . Cholecalciferol (VITAMIN D3) 2000 UNITS TABS Take 1 tablet by mouth at bedtime.        . cloNIDine (CATAPRES - DOSED IN MG/24 HR) 0.3 mg/24hr Place 1 patch onto the skin once a week. Take on mondays      . clopidogrel (PLAVIX) 75 MG tablet Take 75 mg by mouth daily.        Marland Kitchen escitalopram (LEXAPRO) 20 MG tablet Take 20 mg by mouth daily.        . furosemide (LASIX) 80 MG tablet Take 80 mg by mouth 2 (two) times daily.        . insulin glargine (LANTUS) 100 UNIT/ML injection Inject 62 Units into the skin daily.       . insulin lispro (HUMALOG) 100 UNIT/ML injection 10 units at breakfast, 10 at lunch, and 20 at dinner       . isosorbide mononitrate (IMDUR) 60 MG 24 hr tablet  Take 60 mg by mouth daily.        Marland Kitchen levothyroxine (SYNTHROID, LEVOTHROID) 112 MCG tablet Take 112 mcg by mouth daily.        . nebivolol (BYSTOLIC) 2.5 MG tablet Take 2.5 mg by mouth daily.        Marland Kitchen NEXIUM 40 MG capsule TAKE ONE CAPSULE DAILY AS NEEDED FOR REFLUX  30 capsule  0  . omega-3 acid ethyl esters (LOVAZA) 1 G capsule Take 2 g by mouth 2 (two) times daily.      Marland Kitchen oxyCODONE-acetaminophen (PERCOCET) 5-325 MG per tablet Take 2 tablets by mouth every 8 (eight) hours as needed for pain.  15 tablet  0  . potassium chloride SA (K-DUR,KLOR-CON) 20 MEQ tablet Take 20 mEq by mouth 2 (two) times daily.        . valsartan (DIOVAN) 160 MG tablet Take 160 mg by mouth 2 (two) times daily.        Assessment: 66 yo female with elevated cardiac markers for Heparin. Heparin level came back subtherapeutic.   Goal of Therapy:  Heparin level 0.3-0.7 units/ml Monitor platelets by anticoagulation protocol: Yes   Plan:  Heparin 2500 units bolus Heparin drip at 1200 units/hr Check heparin level in 8 hours.   Ulyses Southward Henderson 09/29/2011,12:55 PM

## 2011-09-30 ENCOUNTER — Inpatient Hospital Stay (HOSPITAL_COMMUNITY): Payer: BC Managed Care – PPO

## 2011-09-30 DIAGNOSIS — E1165 Type 2 diabetes mellitus with hyperglycemia: Secondary | ICD-10-CM

## 2011-09-30 DIAGNOSIS — J13 Pneumonia due to Streptococcus pneumoniae: Secondary | ICD-10-CM

## 2011-09-30 DIAGNOSIS — D7289 Other specified disorders of white blood cells: Secondary | ICD-10-CM

## 2011-09-30 DIAGNOSIS — N179 Acute kidney failure, unspecified: Secondary | ICD-10-CM

## 2011-09-30 LAB — CARDIAC PANEL(CRET KIN+CKTOT+MB+TROPI)
CK, MB: 19.2 ng/mL (ref 0.3–4.0)
Relative Index: 11.7 — ABNORMAL HIGH (ref 0.0–2.5)
Total CK: 164 U/L (ref 7–177)
Troponin I: 0.43 ng/mL (ref <0.30)

## 2011-09-30 LAB — BASIC METABOLIC PANEL
CO2: 22 mEq/L (ref 19–32)
Glucose, Bld: 190 mg/dL — ABNORMAL HIGH (ref 70–99)
Potassium: 4.8 mEq/L (ref 3.5–5.1)
Sodium: 132 mEq/L — ABNORMAL LOW (ref 135–145)

## 2011-09-30 LAB — GLUCOSE, CAPILLARY: Glucose-Capillary: 197 mg/dL — ABNORMAL HIGH (ref 70–99)

## 2011-09-30 LAB — CBC
Hemoglobin: 9.7 g/dL — ABNORMAL LOW (ref 12.0–15.0)
Platelets: 206 10*3/uL (ref 150–400)
RBC: 3.23 MIL/uL — ABNORMAL LOW (ref 3.87–5.11)
WBC: 14.1 10*3/uL — ABNORMAL HIGH (ref 4.0–10.5)

## 2011-09-30 LAB — URINE CULTURE

## 2011-09-30 LAB — HEPARIN LEVEL (UNFRACTIONATED): Heparin Unfractionated: 0.37 IU/mL (ref 0.30–0.70)

## 2011-09-30 MED ORDER — TECHNETIUM TC 99M TETROFOSMIN IV KIT
10.0000 | PACK | Freq: Once | INTRAVENOUS | Status: AC | PRN
  Administered 2011-09-30: 10 via INTRAVENOUS

## 2011-09-30 MED ORDER — INSULIN ASPART 100 UNIT/ML ~~LOC~~ SOLN
0.0000 [IU] | Freq: Three times a day (TID) | SUBCUTANEOUS | Status: DC
  Administered 2011-09-30 – 2011-10-01 (×2): 5 [IU] via SUBCUTANEOUS
  Administered 2011-10-02: 8 [IU] via SUBCUTANEOUS
  Administered 2011-10-02 – 2011-10-03 (×2): 2 [IU] via SUBCUTANEOUS
  Administered 2011-10-03: 5 [IU] via SUBCUTANEOUS
  Administered 2011-10-04: 2 [IU] via SUBCUTANEOUS
  Administered 2011-10-04 – 2011-10-05 (×3): 3 [IU] via SUBCUTANEOUS
  Administered 2011-10-06: 8 [IU] via SUBCUTANEOUS
  Administered 2011-10-06: 2 [IU] via SUBCUTANEOUS

## 2011-09-30 MED ORDER — TECHNETIUM TC 99M TETROFOSMIN IV KIT
30.0000 | PACK | Freq: Once | INTRAVENOUS | Status: AC | PRN
  Administered 2011-09-30: 30 via INTRAVENOUS

## 2011-09-30 MED ORDER — INSULIN ASPART 100 UNIT/ML ~~LOC~~ SOLN
0.0000 [IU] | Freq: Every day | SUBCUTANEOUS | Status: DC
  Administered 2011-10-03: 2 [IU] via SUBCUTANEOUS

## 2011-09-30 MED ORDER — REGADENOSON 0.4 MG/5ML IV SOLN
0.4000 mg | Freq: Once | INTRAVENOUS | Status: AC
  Administered 2011-09-30: 0.4 mg via INTRAVENOUS
  Filled 2011-09-30: qty 5

## 2011-09-30 MED ORDER — INSULIN ASPART 100 UNIT/ML ~~LOC~~ SOLN
4.0000 [IU] | Freq: Three times a day (TID) | SUBCUTANEOUS | Status: DC
  Administered 2011-09-30 – 2011-10-06 (×15): 4 [IU] via SUBCUTANEOUS

## 2011-09-30 NOTE — Progress Notes (Signed)
Inpatient Diabetes Program Recommendations  AACE/ADA: New Consensus Statement on Inpatient Glycemic Control (2009)  Target Ranges:  Prepandial:   less than 140 mg/dL      Peak postprandial:   less than 180 mg/dL (1-2 hours)      Critically ill patients:  140 - 180 mg/dL    Inpatient Diabetes Program Recommendations Correction (SSI): Increase to MODERATE scale TID + HS  Thank you  Regie Bunner RN,BSN,CDE Inpatient Diabetes Coordinator [phone]    

## 2011-09-30 NOTE — Progress Notes (Signed)
Report called to Truddie Hidden, RN on [id number]. Pt transferring to [id number] via wheelchair, heart monitor, IV, no O2. VS stable upon transfer. No current complaints or questions. Family at bedside and aware of transfer. RN to accompany transfer to tele bed. Mariena Meares L

## 2011-09-30 NOTE — Progress Notes (Signed)
Patient refused to wear CPAP tonight.  Patient stated that she wanted to remain on nasal cannula tonight.

## 2011-09-30 NOTE — Progress Notes (Signed)
[facility]  Subjective: A short episode of pressure-like CP last night and nausea  Objective: Vital signs in last 24 hours: Temp:  [97.2 F (36.2 C)-97.8 F (36.6 C)] 97.2 F (36.2 C) (05/10 0836) Pulse Rate:  [43-60] 43  (05/10 0836) Resp:  [15-20] 15  (05/10 0836) BP: (127-150)/(35-44) 127/37 mmHg (05/10 0836) SpO2:  [94 %-100 %] 99 % (05/10 0836) Weight:  [85.8 kg (189 lb 2.5 oz)] 85.8 kg (189 lb 2.5 oz) (05/10 0028) Last BM Date: 09/28/11 (waiting for stool sample)  Intake/Output from previous day: 05/09 0701 - 05/10 0700 In: 1738 [P.O.:480; I.V.:1056; IV Piggyback:202] Out: 1080 [Urine:1080] Intake/Output this shift: Total I/O In: 64 [I.V.:64] Out: -   Medications Current Facility-Administered Medications  Medication Dose Route Frequency Provider Last Rate Last Dose  . 0.9 %  sodium chloride infusion   Intravenous Continuous Russella Dar, NP 20 mL/hr at 09/29/11 1200    . acetaminophen (TYLENOL) tablet 650 mg  650 mg Oral Q6H PRN Eduard Clos, MD   650 mg at 09/30/11 0242   Or  . acetaminophen (TYLENOL) suppository 650 mg  650 mg Rectal Q6H PRN Eduard Clos, MD      . ALPRAZolam Prudy Feeler) tablet 0.5 mg  0.5 mg Oral QHS PRN Eduard Clos, MD      . amLODipine (NORVASC) tablet 7.5 mg  7.5 mg Oral Daily Eduard Clos, MD   7.5 mg at 09/29/11 1211  . cloNIDine (CATAPRES - Dosed in mg/24 hr) patch 0.3 mg  0.3 mg Transdermal Weekly Eduard Clos, MD   0.3 mg at 09/29/11 1329  . clopidogrel (PLAVIX) tablet 75 mg  75 mg Oral Q breakfast Eduard Clos, MD   75 mg at 09/30/11 0856  . escitalopram (LEXAPRO) tablet 20 mg  20 mg Oral Daily Eduard Clos, MD   20 mg at 09/29/11 1210  . heparin ADULT infusion 100 units/mL (25000 units/250 mL)  1,200 Units/hr Intravenous Continuous Calvert Cantor, MD 12 mL/hr at 09/29/11 2135 1,200 Units/hr at 09/29/11 2135  . heparin bolus via infusion 2,500 Units  2,500 Units  Intravenous Once Calvert Cantor, MD   2,500 Units at 09/29/11 1332  . hydrALAZINE (APRESOLINE) injection 10 mg  10 mg Intravenous Q4H PRN Eduard Clos, MD      . insulin aspart (novoLOG) injection 0-9 Units  0-9 Units Subcutaneous TID WC Eduard Clos, MD   1 Units at 09/30/11 [id number]  . insulin glargine (LANTUS) injection 62 Units  62 Units Subcutaneous Daily Eduard Clos, MD   62 Units at 09/29/11 1100  . isosorbide mononitrate (IMDUR) 24 hr tablet 60 mg  60 mg Oral Daily Eduard Clos, MD   60 mg at 09/29/11 1209  . levothyroxine (SYNTHROID, LEVOTHROID) tablet 112 mcg  112 mcg Oral Q breakfast Eduard Clos, MD   112 mcg at 09/30/11 0856  . nebivolol (BYSTOLIC) tablet 2.5 mg  2.5 mg Oral Daily Eduard Clos, MD   2.5 mg at 09/29/11 1210  . omega-3 acid ethyl esters (LOVAZA) capsule 2 g  2 g Oral BID Eduard Clos, MD   2 g at 09/29/11 1208  . ondansetron (ZOFRAN) tablet 4 mg  4 mg Oral Q6H PRN Eduard Clos, MD       Or  . ondansetron [facility]) injection 4 mg  4 mg Intravenous Q6H PRN Eduard Clos, MD   4 mg at 09/30/11 0924  .  oxyCODONE-acetaminophen (PERCOCET) 5-325 MG per tablet 1 tablet  1 tablet Oral Q8H PRN Eduard Clos, MD   1 tablet at 09/29/11 2042  . pantoprazole (PROTONIX) EC tablet 40 mg  40 mg Oral Daily Eduard Clos, MD   40 mg at 09/29/11 1100  . predniSONE (DELTASONE) tablet 20 mg  20 mg Oral Q breakfast Russella Dar, NP   20 mg at 09/30/11 0757  . sodium chloride 0.9 % injection 3 mL  3 mL Intravenous Q12H Eduard Clos, MD   3 mL at 09/29/11 0312  . vancomycin (VANCOCIN) IVPB 1000 mg/200 mL premix  1,000 mg Intravenous Q24H Madolyn Frieze, PHARMD   1,000 mg at 09/29/11 2135  . DISCONTD: cefTRIAXone (ROCEPHIN) 1 g in dextrose 5 % 50 mL IVPB  1 g Intravenous Q24H Calvert Cantor, MD      . DISCONTD: heparin ADULT infusion 100 units/mL (25000 units/250 mL)  1,000 Units/hr Intravenous Continuous Lonia Blood, MD 10 mL/hr at 09/29/11 0500 1,000 Units/hr at 09/29/11 0500    PE: General appearance: alert, cooperative and Anxious about lexiscan Lungs: clear to auscultation bilaterally Heart: regular rate and rhythm, 2/6 systolic MM (holosystolic) Extremities: No LEE Pulses: 2+ and symmetric  Lab Results:   Basename 09/30/11 0450 09/29/11 0700 09/29/11 0159  WBC 14.1* 13.5* 15.5*  HGB 9.7* 9.4* 9.8*  HCT 28.9* 27.5* 28.0*  PLT 206 189 195   BMET  Basename 09/30/11 0450 09/29/11 0700 09/29/11 0313  NA 132* 135 133*  K 4.8 4.1 4.4  CL 102 102 101  CO2 22 18* 20  GLUCOSE 190* 126* 140*  BUN 48* 53* 57*  CREATININE 1.60* 1.85* 2.00*  CALCIUM 8.9 8.6 8.5     Assessment/Plan  Principal Problem:  *Elevated troponin Active Problems:  HYPOTHYROIDISM  HYPERLIPIDEMIA  HYPERTENSION  COPD UNSPECIFIED  VERTIGO  Obstructive sleep apnea on nocturnal CPAP  Leukocytosis  Acute renal failure  Pulmonary HTN-severe- due to diastolic dysfunction  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  Nausea with vomiting  Diarrhea  Volume depletion, gastrointestinal loss  Plan:  The patient was seen in the Nuc lab.  She continues to be hypertensive.  Today 127/37 - 197/30 Yesterday: 130/35 - 150/39.  Some of the cause may be related to anxiety of the Nuc.  She did not like the feeling the last time.  PRN hydralazine already ordered.  Last troponin was the peak, 1.01.  Will recheck today.  Zofran given for nausea and helped.  Echo pending.  Anemia Stable.  SCr. Improving, 1.60.  Will follow-up nuc results later.   LOS: 2 days    HAGER,BRYAN W 09/30/2011 11:29 AM  ATTENDING ATTESTATION:  I have seen and examined the patient along with Wilburt Finlay, PA.  I have reviewed the chart, notes and new data.  I agree with Bryan's note.  Myoview - no perfusion defects   Echo -- currently being read (report not in)  Renal function improving & troponin    PLAN:  Pending Echo results, would consider repeat RHC early next week to re-assess Dx of Diastolic HF in a setting without significant systemic hypertension.  Negative Myoview re-enforces recent coronary angiography confirming that + Troponin is likely not ACS, rather due to pressure/?volume overload of RV.    In the setting of Pulm HTN (TR on exam) -  May consider long term anticoagulation unless contra-indicated due to bleeding concerns. Will see tomorrow after echo has been read.  Anali Cabanilla W,  M.D., M.S. [facility] [address] [place], [place]  [address]  [phone]  09/30/2011 5:05 PM

## 2011-09-30 NOTE — Progress Notes (Signed)
ANTICOAGULATION CONSULT NOTE - Follow Up Consult  Pharmacy Consult for Heparin Indication: positive cardiac enzymes  Allergies  Allergen Reactions  . Amlodipine Besylate     unknown  . Atenolol     unknown  . Bystolic (Nebivolol Hcl)     Cannot tolerate greater then 5 mg, causes chest pain  . Ciprofloxacin     unknown  . Codeine     swelling  . Lidocaine     Throat swelling  . Metformin     vomiting  . Metoprolol     unknown  . Penicillins     rash    Patient Measurements: Height: 5\' 3"  (160 cm) Weight: 189 lb 2.5 oz (85.8 kg) IBW/kg (Calculated) : 52.4  Heparin Dosing Weight: 71.4  Vital Signs: Temp: 97.2 F (36.2 C) (05/10 0836) Temp src: Oral (05/10 0836) BP: 127/37 mmHg (05/10 0836) Pulse Rate: 43  (05/10 0836)  Labs:  Basename 09/30/11 0450 09/29/11 2000 09/29/11 1206 09/29/11 0700 09/29/11 0313 09/29/11 0159 09/29/11 0137 09/28/11 2138 09/28/11 1901 09/27/11 1853  HGB 9.7* -- -- 9.4* -- -- -- -- -- --  HCT 28.9* -- -- 27.5* -- 28.0* -- -- -- --  PLT 206 -- -- 189 -- 195 -- -- -- --  APTT -- -- -- -- -- -- -- -- -- --  LABPROT -- -- -- -- -- -- -- -- -- --  INR -- -- -- -- -- -- -- -- -- --  HEPARINUNFRC 0.37 0.38 0.12* -- -- -- -- -- -- --  CREATININE 1.60* -- -- 1.85* 2.00* -- -- -- -- --  CKTOTAL -- -- -- -- -- -- 280* 289* -- 415*  CKMB -- -- -- -- -- -- 11.4* 11.9* -- --  TROPONINI -- -- -- -- -- -- 1.01* 0.69* 0.39* --    Estimated Creatinine Clearance: 37.4 ml/min (by C-G formula based on Cr of 1.6).   Medications:  Infusions:     . sodium chloride 20 mL/hr at 09/29/11 1200  . heparin 1,200 Units/hr (09/29/11 2135)  . DISCONTD: heparin 1,000 Units/hr (09/29/11 0500)    Assessment: Positive cardiac enzymes:  Heparin level is therapeutic again this AM. No complications with bleeding. Plt stable. Pt now also has bacteremia and vanc was started last PM.   Goal of Therapy:  Heparin level 0.3-0.7 units/ml Monitor platelets by  anticoagulation protocol: Yes   Plan:  Continue Heparin at 1200 units/hr Check AM Heparin level and CBC

## 2011-09-30 NOTE — Progress Notes (Signed)
*  PRELIMINARY RESULTS* Echocardiogram 2D Echocardiogram has been performed.  Glean Salen [facility] 09/30/2011, 3:06 PM

## 2011-09-30 NOTE — Progress Notes (Signed)
[facility] 66 year-old female has been feeling weak over the last 3-4 days. feeling dizzy whenever she stands up and having nausea vomiting and diarrhea. Gi symptoms resolved upon admission. She was hydrated and Lasix has been held. IVF has been stopped due to h/o severe pulm HTN.   Subjective: Endorses generalized weakness and malaise. Hungry. Husband at bedside and both updated on status and plans to transfer. Has issues with sinusitis recently.  Objective: Vital signs in last 24 hours: Temp:  [97.2 F (36.2 C)-97.8 F (36.6 C)] 97.5 F (36.4 C) (05/10 1315) Pulse Rate:  [43-75] 70  (05/10 1141) Resp:  [13-20] 16  (05/10 1315) BP: (127-197)/(30-48) 167/42 mmHg (05/10 1315) SpO2:  [94 %-100 %] 98 % (05/10 1315) Weight:  [85.8 kg (189 lb 2.5 oz)] 85.8 kg (189 lb 2.5 oz) (05/10 0028) Weight change: 2 kg (4 lb 6.5 oz) Last BM Date: 09/28/11 (waiting for stool sample)  Intake/Output from previous day: 05/09 0701 - 05/10 0700 In: 1738 [P.O.:480; I.V.:1056; IV Piggyback:202] Out: 1080 [Urine:1080] Intake/Output this shift: Total I/O In: 226 [I.V.:224; IV Piggyback:2] Out: -   General appearance: alert, cooperative, appears stated age and no distress Resp: clear to auscultation bilaterally, room air with saturations 98% Cardio: regular rate and rhythm, S1, S2 normal, no murmur, click, rub or gallop GI: soft, non-tender; bowel sounds normal; no masses,  no organomegaly Extremities: extremities normal, atraumatic, no cyanosis or edema Neurologic: Grossly normal  Lab Results:  Basename 09/30/11 0450 09/29/11 0700  WBC 14.1* 13.5*  HGB 9.7* 9.4*  HCT 28.9* 27.5*  PLT 206 189   BMET  Basename 09/30/11 0450 09/29/11 0700  NA 132* 135  K 4.8 4.1  CL 102 102  CO2 22 18*  GLUCOSE 190* 126*  BUN 48* 53*  CREATININE 1.60* 1.85*  CALCIUM 8.9 8.6    Studies/Results: Dg Chest 2 View  09/28/2011  *RADIOLOGY REPORT*  Clinical Data: Leukocytosis.  Weakness.  Tachycardia.   CHEST - 2 VIEW  Comparison: 09/27/2011  Findings: Lung volumes are low. Cardiopericardial silhouette is at upper limits of normal for size. Interstitial markings are diffusely coarsened with chronic features.  No edema or focal airspace consolidation.  No pleural effusion. Imaged bony structures of the thorax are intact.  IMPRESSION: Low volume film with borderline cardiomegaly underlying chronic interstitial coarsening.  No interval change.  Original Report Authenticated By: ERIC A. MANSELL, M.D.   Nm Myocar Multi W/spect W/wall Motion / Ef  09/30/2011  *RADIOLOGY REPORT*  Clinical Data:  Chest pain  MYOCARDIAL IMAGING WITH SPECT (REST AND PHARMACOLOGIC-STRESS) GATED LEFT VENTRICULAR WALL MOTION STUDY LEFT VENTRICULAR EJECTION FRACTION  Technique:  Standard myocardial SPECT imaging was performed after resting intravenous injection of 10 mCi Tc-12m tetrofosmin. Subsequently, intravenous infusion of Lexiscan was performed under the supervision of the Cardiology staff.  At peak effect of the drug, 30 mCi Tc-58m tetrofosmin was injected intravenously and standard myocardial SPECT  imaging was performed.  Quantitative gated imaging was also performed to evaluate left ventricular wall motion, and estimate left ventricular ejection fraction.  Comparison:  None.  Findings:  Spect:  No perfusion defects.  Wall motion:  Normal motion.  Ejection fraction:  61%.  End diastolic volume 93 ml.  End-systolic volume 37 ml.  IMPRESSION: No perfusion defects.  Original Report Authenticated By: Donavan Burnet, M.D.    Medications: I have reviewed the patient's current medications.  Assessment/Plan:  Principal Problem:  *Elevated troponin *Appreciate cardiologist assistance *Stress Myoview today shows no  perfusion defects *Has undergone cardiac catheterization in 2012 with normal coronary arteries and LV function; and apparently has also undergone right and left heart catheterization in the past year at [facility] for  evaluation of her pulmonary hypertension *Cardiology doubts chest pain is ischemic in nature but we'll check a 2-D echocardiogram and pending these findings may need to undergo cardiac catheterization this admission   Acute renal failure *Improved after hydration- now 1.6 *Baseline creatinine 20 and 1.2 and 1.8 one year prior *Given presenting symptoms best approach to diuresis/treatment diastolic heart failure would be utilization of when necessary Lasix based on weight gain and not scheduled Lasix *Agree with holding any contrasted studies until renal function completely recovers   Nausea with vomiting and Diarrhea * acute gastroenteritis? *No further diarrhea, no abdominal pain so will dc stool collection for C. Diff and dc enteric precautions   Volume depletion, gastrointestinal loss *Related to above problem with gastroenteritis *Complicated by ongoing Lasix usage *Now seems euvolemic therefore we'll decrease IV fluids to keep open rate   Leukocytosis/Fever/Bacteremia- preliminary: strep species *Initial white count 23,800; down to 13,500 on 5/9 and up slightly today to 14,100 *Continue Vancomycin (started 5/9) until final cultures result * Suspect will need total 14 days treatment  *  Source sinuses? No sore throat or pneumonia symptoms but has been getting yellow mucous from nose.   Pulmonary HTN-severe- due to diastolic dysfunction *Has undergone extensive evaluation at [facility] and based on recent right heart catheterization the etiology to her pulmonary hypertension was due to diastolic dysfunction *Continue afterload reduction with nitrates she is on Imdur *2-D echocardiogram pending and based on these findings cardiology may perform another right heart catheterization *may need to be started on chronic Coumadin anticoagulation therefore we will continue current IV heparin until this is clarified the cardiology   Proximal muscle weakness-felt related to statin-induced  myositis-recurrent *Also extensively evaluated at [facility] *Recently evaluated by ENT and started on a two-week course of prednisone 20 mg daily therefore we will resume this medication while here *Patient has family history of ALS but previous evaluations at [facility] show no evidence of this disorder  Peripheral vascular disease *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 and has undergone prior stent placement *Continue plavix   HYPERLIPIDEMIA *History of statin intolerance *Continue omega-3 fatty acids   COPD UNSPECIFIED *Appears to be compensated at this time   VERTIGO *Currently no symptoms *Was also dizzy prior to presentation and possibly related to recent dehydration complicated by underlying known vertigo   HYPOTHYROIDISM *Continue levothyroxine   HYPERTENSION *Continue amlodipine, clonidine, Imdur as well as beta blocker   Obstructive sleep apnea on nocturnal CPAP *We have ordered CPAP for hour of sleep use here  Disposition *Transfer to telemetry   LOS: 2 days   Junious Silk, ANP pager [phone]  [facility] [email or web address] Password: [email or web address]  09/30/2011, 2:38 PM  I have examined the patient and reviewed the chart. Will repeat blood cultures to ensure clearing. I have modified the above note and agree with it.   Linzie Criss

## 2011-10-01 DIAGNOSIS — D649 Anemia, unspecified: Secondary | ICD-10-CM | POA: Diagnosis present

## 2011-10-01 DIAGNOSIS — H6690 Otitis media, unspecified, unspecified ear: Secondary | ICD-10-CM | POA: Insufficient documentation

## 2011-10-01 DIAGNOSIS — B955 Unspecified streptococcus as the cause of diseases classified elsewhere: Secondary | ICD-10-CM | POA: Diagnosis present

## 2011-10-01 DIAGNOSIS — N179 Acute kidney failure, unspecified: Secondary | ICD-10-CM

## 2011-10-01 DIAGNOSIS — I330 Acute and subacute infective endocarditis: Secondary | ICD-10-CM | POA: Diagnosis present

## 2011-10-01 DIAGNOSIS — E1165 Type 2 diabetes mellitus with hyperglycemia: Secondary | ICD-10-CM

## 2011-10-01 DIAGNOSIS — J13 Pneumonia due to Streptococcus pneumoniae: Secondary | ICD-10-CM

## 2011-10-01 DIAGNOSIS — R739 Hyperglycemia, unspecified: Secondary | ICD-10-CM | POA: Diagnosis present

## 2011-10-01 DIAGNOSIS — D7289 Other specified disorders of white blood cells: Secondary | ICD-10-CM

## 2011-10-01 HISTORY — DX: Unspecified streptococcus as the cause of diseases classified elsewhere: B95.5

## 2011-10-01 LAB — CULTURE, BLOOD (ROUTINE X 2): Culture  Setup Time: 201305090131

## 2011-10-01 LAB — CBC
MCH: 30.4 pg (ref 26.0–34.0)
MCHC: 34.3 g/dL (ref 30.0–36.0)
MCV: 88.7 fL (ref 78.0–100.0)
Platelets: 259 10*3/uL (ref 150–400)
RBC: 3.35 MIL/uL — ABNORMAL LOW (ref 3.87–5.11)
RDW: 13.1 % (ref 11.5–15.5)

## 2011-10-01 LAB — GLUCOSE, CAPILLARY: Glucose-Capillary: 194 mg/dL — ABNORMAL HIGH (ref 70–99)

## 2011-10-01 MED ORDER — INSULIN GLARGINE 100 UNIT/ML ~~LOC~~ SOLN
62.0000 [IU] | Freq: Every day | SUBCUTANEOUS | Status: DC
  Administered 2011-10-01 – 2011-10-05 (×5): 62 [IU] via SUBCUTANEOUS

## 2011-10-01 MED ORDER — DEXTROSE 5 % IV SOLN
2.0000 g | INTRAVENOUS | Status: DC
  Administered 2011-10-01 – 2011-10-06 (×6): 2 g via INTRAVENOUS
  Filled 2011-10-01 (×7): qty 2

## 2011-10-01 NOTE — Consult Note (Signed)
Infectious Diseases Initial Consultation                Day 3 vancomycin  Date of Admission:  09/28/2011  Date of Consult:  10/01/2011  Reason for Consult: Dr. 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 Change vancomycin to ceftriaxone 2 g IV daily 2. Repeat blood cultures tomorrow 3. 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 Her echocardiogram shows evidence of aortic valve and mitral valve involvement but fortunately she does not have any evidence of valvular insufficiency or abscess. She is allergic to penicillin with a very remote history of a non-life-threatening rash. She states that she has taken ampicillin and amoxicillin for dental problems more recently. Current guidelines would indicate that the optimal therapy in this setting would be high-dose ceftriaxone. I would not favor adding an aminoglycoside given her renal insufficiency. I will repeat blood cultures tomorrow and hold off placing a PICC until we know that her blood cultures are negative. Streptococcus gallolyticus is a new name for strep bovis, and organism with a strong association with colonic malignancy. I would recommend colonoscopy assuming she can tolerate the procedure. I agree that transesophageal echocardiography is not likely to add anything else to her  management at this time.    HPI: [name] is a 66 y.o. female who was admitted 2 days ago with a 3 day history of extreme fatigue, nausea and diarrhea. She was unaware of having any fever at home but was febrile on admission. Four of 4 sets of admission blood cultures have grown Streptococcus gallolyticus, formerly known as strep bovis. Her transthoracic echocardiogram shows filamentous vegetations on the aortic valve and mitral valve. Only trivial aortic insufficiency is seen. She has no evidence of conduction abnormalities on her EKG. She states that she is feeling much better since admission. She denies any recent change in appetite or weight. She has never had a colonoscopy.   Review of Systems: Pertinent items are noted in HPI.     Marland Kitchen amLODipine  7.5 mg Oral Daily  . cloNIDine  0.3 mg Transdermal Weekly  . clopidogrel  75 mg Oral Q breakfast  . escitalopram  20 mg Oral Daily  . insulin aspart  0-15 Units Subcutaneous TID WC  . insulin aspart  0-5 Units Subcutaneous QHS  . insulin aspart  4 Units Subcutaneous TID WC  . insulin glargine  62 Units Subcutaneous QHS  . isosorbide mononitrate  60 mg Oral Daily  . levothyroxine  112 mcg Oral Q breakfast  . nebivolol  2.5 mg Oral Daily  . omega-3 acid ethyl esters  2 g Oral BID  . pantoprazole  40 mg Oral Daily  . predniSONE  20 mg Oral Q breakfast  . sodium chloride  3 mL Intravenous Q12H  . vancomycin  1,000 mg Intravenous Q24H  . DISCONTD: insulin glargine  62 Units Subcutaneous Daily    Past Medical History  Diagnosis Date  . HTN (hypertension)   . Heart attack   .  CHF (congestive heart failure)   . Diabetes mellitus   . High cholesterol   . Kidney disease     History  Substance Use Topics  . Smoking status: Former Smoker -- 1.0 packs/day for 25 years    Types: Cigarettes    Quit date: 05/23/2004  . Smokeless tobacco: Not on file  . Alcohol Use: No    Family History  Problem Relation Age of Onset  . Heart  disease Mother   . Heart disease Father   . Heart disease Sister   . Heart disease Daughter    Allergies  Allergen Reactions  . Amlodipine Besylate     unknown  . Atenolol     unknown  . Bystolic (Nebivolol Hcl)     Cannot tolerate greater then 5 mg, causes chest pain  . Ciprofloxacin     unknown  . Codeine     swelling  . Lidocaine     Throat swelling  . Metformin     vomiting  . Metoprolol     unknown  . Penicillins     rash    OBJECTIVE: Blood pressure 151/45, pulse 52, temperature 98.3 F (36.8 C), temperature source Oral, resp. rate 19, height 5\' 3"  (1.6 m), weight 82.9 kg (182 lb 12.2 oz), SpO2 98.00%. General: She is alert and in good spirits Skin: No rash, conjunctival or splinter hemorrhages Lungs: Clear Cor: Regular S1 and S2 with no murmurs Abdomen: Obese, soft and nontender. I do not feel any masses Joints and extremities: Normal  Microbiology: Recent Results (from the past 240 hour(s))  CULTURE, BLOOD (ROUTINE X 2)     Status: Normal   Collection Time   09/28/11  6:48 PM      Component Value Range Status Comment   Specimen Description BLOOD ARM LEFT   Final    Special Requests BOTTLES DRAWN AEROBIC ONLY 10CC   Final    Culture  Setup Time [id number]   Final    Culture     Final    Value: STREPTOCOCCUS SPECIES     Note: SUSCEPTIBILITIES PERFORMED ON PREVIOUS CULTURE WITHIN THE LAST 5 DAYS.     Note: Gram Stain Report Called to,Read Back By and Verified With: LINDA [facility] @ 1806 ON 09/29/11 BY GOLLD   Report Status 10/01/2011 FINAL   Final   CULTURE, BLOOD (ROUTINE X 2)     Status: Normal   Collection Time   09/28/11  7:01 PM      Component Value Range Status Comment   Specimen Description BLOOD ARM RIGHT   Final    Special Requests BOTTLES DRAWN AEROBIC ONLY 10CC   Final    Culture  Setup Time [id number]   Final    Culture     Final    Value: STREPTOCOCCUS SPECIES     Note: Identified as Streptococcus gallolyticus     Note: Gram Stain Report Called  to,Read Back By and Verified With: [facility] @ 1806 ON 09/29/11 BY GOLLD   Report Status 10/01/2011 FINAL   Final    Organism ID, Bacteria STREPTOCOCCUS SPECIES   Final   MRSA PCR SCREENING     Status: Normal   Collection Time   09/29/11  5:26 AM      Component Value Range Status Comment   MRSA by PCR NEGATIVE  NEGATIVE  Final   URINE CULTURE     Status: Normal   Collection Time   09/29/11  6:24 AM  Component Value Range Status Comment   Specimen Description URINE, RANDOM   Final    Special Requests NONE   Final    Culture  Setup Time [id number]   Final    Colony Count 20,OOO COLONIES/ML   Final    Culture     Final    Value: Multiple bacterial morphotypes present, none predominant. Suggest appropriate recollection if clinically indicated.   Report Status 09/30/2011 FINAL   Final   CULTURE, BLOOD (ROUTINE X 2)     Status: Normal (Preliminary result)   Collection Time   09/29/11  8:00 PM      Component Value Range Status Comment   Specimen Description BLOOD RIGHT ARM   Final    Special Requests BOTTLES DRAWN AEROBIC ONLY 10CC   Final    Culture  Setup Time [id number]   Final    Culture     Final    Value: GRAM POSITIVE COCCI IN PAIRS AND CHAINS     Note: Gram Stain Report Called to,Read Back By and Verified With: MEREDITH SMITH @ 1829 ON 09/30/11 BY GOLLD   Report Status PENDING   Incomplete   CULTURE, BLOOD (ROUTINE X 2)     Status: Normal (Preliminary result)   Collection Time   09/29/11  8:03 PM      Component Value Range Status Comment   Specimen Description BLOOD LEFT ARM   Final    Special Requests BOTTLES DRAWN AEROBIC AND ANAEROBIC 10CC   Final    Culture  Setup Time [id number]   Final    Culture     Final    Value: GRAM POSITIVE COCCI IN PAIRS AND CHAINS     Note: Gram Stain Report Called to,Read Back By and Verified With: ELLEN BETHEL 09/30/11 13:40 BT GARRS   Report Status PENDING   Incomplete     Cliffton Asters, MD [facility] [phone] pager   [phone] cell 10/01/2011, 4:57 PM

## 2011-10-01 NOTE — Progress Notes (Addendum)
[facility]  DAILY PROGRESS NOTE   Subjective:  Echocardiogram reviewed yesterday shows filamentous "vegetations" on the aortic, mitral and tricuspid valves. Blood cultures are positive for streptococcus. She appears to have a declining white blood cell count on antibiotics. Renal function is improving. Chest pain has improved and she had a low-risk myoview.  Objective:  Temp:  [97.4 F (36.3 C)-98.9 F (37.2 C)] 98.2 F (36.8 C) (05/11 0935) Pulse Rate:  [45-75] 45  (05/11 0935) Resp:  [16-20] 19  (05/11 0935) BP: (142-197)/(30-62) 153/37 mmHg (05/11 0935) SpO2:  [93 %-100 %] 98 % (05/11 0935) Weight:  [82.9 kg (182 lb 12.2 oz)] 82.9 kg (182 lb 12.2 oz) (05/10 2103) Weight change: -2.9 kg (-6 lb 6.3 oz)  Intake/Output from previous day: 05/10 0701 - 05/11 0700 In: 1506 [P.O.:600; I.V.:704; IV Piggyback:202] Out: -   Intake/Output from this shift: Total I/O In: 60 [P.O.:60] Out: -   Medications: Current Facility-Administered Medications  Medication Dose Route Frequency Provider Last Rate Last Dose  . 0.9 %  sodium chloride infusion   Intravenous Continuous Russella Dar, NP 20 mL/hr at 09/29/11 1200    . acetaminophen (TYLENOL) tablet 650 mg  650 mg Oral Q6H PRN Eduard Clos, MD   650 mg at 09/30/11 0242   Or  . acetaminophen (TYLENOL) suppository 650 mg  650 mg Rectal Q6H PRN Eduard Clos, MD      . ALPRAZolam Prudy Feeler) tablet 0.5 mg  0.5 mg Oral QHS PRN Eduard Clos, MD      . amLODipine (NORVASC) tablet 7.5 mg  7.5 mg Oral Daily Eduard Clos, MD   7.5 mg at 10/01/11 1017  . cloNIDine (CATAPRES - Dosed in mg/24 hr) patch 0.3 mg  0.3 mg Transdermal Weekly Eduard Clos, MD   0.3 mg at 09/29/11 1329  . clopidogrel (PLAVIX) tablet 75 mg  75 mg Oral Q breakfast Eduard Clos, MD   75 mg at 10/01/11 1017  . escitalopram (LEXAPRO) tablet 20 mg  20 mg Oral Daily Eduard Clos, MD   20 mg at 10/01/11 1018    . heparin ADULT infusion 100 units/mL (25000 units/250 mL)  1,200 Units/hr Intravenous Continuous Calvert Cantor, MD 12 mL/hr at 09/30/11 2034 1,200 Units/hr at 09/30/11 2034  . hydrALAZINE (APRESOLINE) injection 10 mg  10 mg Intravenous Q4H PRN Eduard Clos, MD      . insulin aspart (novoLOG) injection 0-15 Units  0-15 Units Subcutaneous TID WC Russella Dar, NP   5 Units at 09/30/11 1735  . insulin aspart (novoLOG) injection 0-5 Units  0-5 Units Subcutaneous QHS Russella Dar, NP      . insulin aspart (novoLOG) injection 4 Units  4 Units Subcutaneous TID WC Russella Dar, NP   4 Units at 10/01/11 0848  . insulin glargine (LANTUS) injection 62 Units  62 Units Subcutaneous QHS Wyline Copas, PHARMD      . isosorbide mononitrate (IMDUR) 24 hr tablet 60 mg  60 mg Oral Daily Eduard Clos, MD   60 mg at 10/01/11 1019  . levothyroxine (SYNTHROID, LEVOTHROID) tablet 112 mcg  112 mcg Oral Q breakfast Eduard Clos, MD   112 mcg at 10/01/11 1021  . nebivolol (BYSTOLIC) tablet 2.5 mg  2.5 mg Oral Daily Eduard Clos, MD   2.5 mg at 10/01/11 1022  . omega-3 acid ethyl esters (LOVAZA) capsule 2 g  2 g Oral BID  Eduard Clos, MD   2 g at 09/30/11 2259  . ondansetron (ZOFRAN) tablet 4 mg  4 mg Oral Q6H PRN Eduard Clos, MD       Or  . ondansetron [facility]) injection 4 mg  4 mg Intravenous Q6H PRN Eduard Clos, MD   4 mg at 10/01/11 0231  . oxyCODONE-acetaminophen (PERCOCET) 5-325 MG per tablet 1 tablet  1 tablet Oral Q8H PRN Eduard Clos, MD   1 tablet at 09/29/11 2042  . pantoprazole (PROTONIX) EC tablet 40 mg  40 mg Oral Daily Eduard Clos, MD   40 mg at 09/30/11 1334  . predniSONE (DELTASONE) tablet 20 mg  20 mg Oral Q breakfast Russella Dar, NP   20 mg at 10/01/11 0849  . regadenoson (LEXISCAN) injection SOLN 0.4 mg  0.4 mg Intravenous Once Dwana Melena, PA   0.4 mg at 09/30/11 1136  . sodium chloride 0.9 % injection 3 mL  3 mL Intravenous  Q12H Eduard Clos, MD   3 mL at 09/29/11 0312  . technetium tetrofosmin (TC-MYOVIEW) injection 30 milli Curie  30 milli Curie Intravenous Once PRN Medication Radiologist, MD   30 milli Curie at 09/30/11 1030  . vancomycin (VANCOCIN) IVPB 1000 mg/200 mL premix  1,000 mg Intravenous Q24H Madolyn Frieze, PHARMD   1,000 mg at 09/30/11 2258  . DISCONTD: insulin aspart (novoLOG) injection 0-9 Units  0-9 Units Subcutaneous TID WC Eduard Clos, MD   2 Units at 09/30/11 1333  . DISCONTD: insulin glargine (LANTUS) injection 62 Units  62 Units Subcutaneous Daily Eduard Clos, MD   62 Units at 09/30/11 1333    Physical Exam: General appearance: Awake, NAD Neck: no adenopathy, no carotid bruit, no JVD, supple, symmetrical, trachea midline and thyroid not enlarged, symmetric, no tenderness/mass/nodules Lungs: clear to auscultation bilaterally Heart: regular rate and rhythm, S1, S2 normal, 2/6 SEM at LLSB, no click, rub or gallop Abdomen: soft, non-tender; bowel sounds normal; no masses,  no organomegaly Extremities: extremities normal, atraumatic, no cyanosis or edema, no obvious sequelae of endocarditis Pulses: 2+ and symmetric  Lab Results: Results for orders placed during the hospital encounter of 09/28/11 (from the past 48 hour(s))  HEPARIN LEVEL (UNFRACTIONATED)     Status: Abnormal   Collection Time   09/29/11 12:06 PM      Component Value Range Comment   Heparin Unfractionated 0.12 (*) 0.30 - 0.70 (IU/mL)   GLUCOSE, CAPILLARY     Status: Abnormal   Collection Time   09/29/11 12:51 PM      Component Value Range Comment   Glucose-Capillary 206 (*) 70 - 99 (mg/dL)   GLUCOSE, CAPILLARY     Status: Abnormal   Collection Time   09/29/11  5:09 PM      Component Value Range Comment   Glucose-Capillary 187 (*) 70 - 99 (mg/dL)    Comment 1 Notify RN     HEPARIN LEVEL (UNFRACTIONATED)     Status: Normal   Collection Time   09/29/11  8:00 PM      Component Value Range Comment    Heparin Unfractionated 0.38  0.30 - 0.70 (IU/mL)   CULTURE, BLOOD (ROUTINE X 2)     Status: Normal (Preliminary result)   Collection Time   09/29/11  8:00 PM      Component Value Range Comment   Specimen Description BLOOD RIGHT ARM      Special Requests BOTTLES DRAWN AEROBIC ONLY 10CC  Culture  Setup Time [id number]      Culture        Value: GRAM POSITIVE COCCI IN PAIRS AND CHAINS     Note: Gram Stain Report Called to,Read Back By and Verified With: MEREDITH SMITH @ 1829 ON 09/30/11 BY GOLLD   Report Status PENDING     CULTURE, BLOOD (ROUTINE X 2)     Status: Normal (Preliminary result)   Collection Time   09/29/11  8:03 PM      Component Value Range Comment   Specimen Description BLOOD LEFT ARM      Special Requests BOTTLES DRAWN AEROBIC AND ANAEROBIC 10CC      Culture  Setup Time [id number]      Culture        Value: GRAM POSITIVE COCCI IN PAIRS AND CHAINS     Note: Gram Stain Report Called to,Read Back By and Verified With: ELLEN BETHEL 09/30/11 13:40 BT GARRS   Report Status PENDING     GLUCOSE, CAPILLARY     Status: Abnormal   Collection Time   09/29/11  9:51 PM      Component Value Range Comment   Glucose-Capillary 219 (*) 70 - 99 (mg/dL)    Comment 1 Documented in Chart      Comment 2 Notify RN     HEPARIN LEVEL (UNFRACTIONATED)     Status: Normal   Collection Time   09/30/11  4:50 AM      Component Value Range Comment   Heparin Unfractionated 0.37  0.30 - 0.70 (IU/mL)   CBC     Status: Abnormal   Collection Time   09/30/11  4:50 AM      Component Value Range Comment   WBC 14.1 (*) 4.0 - 10.5 (K/uL)    RBC 3.23 (*) 3.87 - 5.11 (MIL/uL)    Hemoglobin 9.7 (*) 12.0 - 15.0 (g/dL)    HCT 21.3 (*) 08.6 - 46.0 (%)    MCV 89.5  78.0 - 100.0 (fL)    MCH 30.0  26.0 - 34.0 (pg)    MCHC 33.6  30.0 - 36.0 (g/dL)    RDW 57.8  46.9 - 62.9 (%)    Platelets 206  150 - 400 (K/uL)   BASIC METABOLIC PANEL     Status: Abnormal   Collection Time   09/30/11  4:50 AM      Component  Value Range Comment   Sodium 132 (*) 135 - 145 (mEq/L)    Potassium 4.8  3.5 - 5.1 (mEq/L)    Chloride 102  96 - 112 (mEq/L)    CO2 22  19 - 32 (mEq/L)    Glucose, Bld 190 (*) 70 - 99 (mg/dL)    BUN 48 (*) 6 - 23 (mg/dL)    Creatinine, Ser 5.28 (*) 0.50 - 1.10 (mg/dL)    Calcium 8.9  8.4 - 10.5 (mg/dL)    GFR calc non Af Amer 33 (*) >90 (mL/min)    GFR calc Af Amer 39 (*) >90 (mL/min)   GLUCOSE, CAPILLARY     Status: Abnormal   Collection Time   09/30/11  8:35 AM      Component Value Range Comment   Glucose-Capillary 150 (*) 70 - 99 (mg/dL)    Comment 1 Notify RN     CARDIAC PANEL(CRET KIN+CKTOT+MB+TROPI)     Status: Abnormal   Collection Time   09/30/11  1:12 PM      Component Value Range Comment   Total CK 164  7 - 177 (U/L)    CK, MB 19.2 (*) 0.3 - 4.0 (ng/mL) CRITICAL VALUE NOTED.  VALUE IS CONSISTENT WITH PREVIOUSLY REPORTED AND CALLED VALUE.   Troponin I 0.43 (*) <0.30 (ng/mL)    Relative Index 11.7 (*) 0.0 - 2.5    GLUCOSE, CAPILLARY     Status: Abnormal   Collection Time   09/30/11  1:17 PM      Component Value Range Comment   Glucose-Capillary 197 (*) 70 - 99 (mg/dL)   GLUCOSE, CAPILLARY     Status: Abnormal   Collection Time   09/30/11  4:17 PM      Component Value Range Comment   Glucose-Capillary 226 (*) 70 - 99 (mg/dL)   GLUCOSE, CAPILLARY     Status: Abnormal   Collection Time   09/30/11  9:16 PM      Component Value Range Comment   Glucose-Capillary 109 (*) 70 - 99 (mg/dL)   HEPARIN LEVEL (UNFRACTIONATED)     Status: Normal   Collection Time   10/01/11  6:00 AM      Component Value Range Comment   Heparin Unfractionated 0.38  0.30 - 0.70 (IU/mL)   CBC     Status: Abnormal   Collection Time   10/01/11  6:00 AM      Component Value Range Comment   WBC 14.2 (*) 4.0 - 10.5 (K/uL) WHITE COUNT CONFIRMED ON SMEAR   RBC 3.35 (*) 3.87 - 5.11 (MIL/uL)    Hemoglobin 10.2 (*) 12.0 - 15.0 (g/dL)    HCT 78.2 (*) 95.6 - 46.0 (%)    MCV 88.7  78.0 - 100.0 (fL)    MCH  30.4  26.0 - 34.0 (pg)    MCHC 34.3  30.0 - 36.0 (g/dL)    RDW 21.3  08.6 - 57.8 (%)    Platelets 259  150 - 400 (K/uL) PLATELET COUNT CONFIRMED BY SMEAR  GLUCOSE, CAPILLARY     Status: Normal   Collection Time   10/01/11  7:19 AM      Component Value Range Comment   Glucose-Capillary 89  70 - 99 (mg/dL)     Imaging: Nm Myocar Multi W/spect W/wall Motion / Ef  09/30/2011  *RADIOLOGY REPORT*  Clinical Data:  Chest pain  MYOCARDIAL IMAGING WITH SPECT (REST AND PHARMACOLOGIC-STRESS) GATED LEFT VENTRICULAR WALL MOTION STUDY LEFT VENTRICULAR EJECTION FRACTION  Technique:  Standard myocardial SPECT imaging was performed after resting intravenous injection of 10 mCi Tc-85m tetrofosmin. Subsequently, intravenous infusion of Lexiscan was performed under the supervision of the Cardiology staff.  At peak effect of the drug, 30 mCi Tc-28m tetrofosmin was injected intravenously and standard myocardial SPECT  imaging was performed.  Quantitative gated imaging was also performed to evaluate left ventricular wall motion, and estimate left ventricular ejection fraction.  Comparison:  None.  Findings:  Spect:  No perfusion defects.  Wall motion:  Normal motion.  Ejection fraction:  61%.  End diastolic volume 93 ml.  End-systolic volume 37 ml.  IMPRESSION: No perfusion defects.  Original Report Authenticated By: Donavan Burnet, M.D.    Assessment:  1. Principal Problem: 2.  *Elevated troponin 3. Active Problems: 4.  HYPOTHYROIDISM 5.  HYPERLIPIDEMIA 6.  HYPERTENSION 7.  COPD UNSPECIFIED 8.  VERTIGO 9.  Obstructive sleep apnea on nocturnal CPAP 10.  Leukocytosis 11.  Acute renal failure 12.  Pulmonary HTN-severe- due to diastolic dysfunction 13.  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  Nausea with vomiting 15.  Diarrhea  16.  Volume depletion, gastrointestinal loss 17.   Plan:  1. She is currently chest pain free. Low risk myoview suggests unlikely to be ACS  troponin elevation, possibly due to right heart strain or endocarditis. The 2D echo is highly suggestive of endocarditis with involvement of left sided aortic and mitral valves. Difficult to ascertain a source. Of course, given her history of polymyositis, there may be an additional underlying rheumatologic disorder and these could represent Liebman-Sacks vegetations. She reports work-up for lupus was negative. Blood cultures, however, were positive for strep this admission, she was febrile with leukocytosis and this is resolving with antibiotics. Therefore, I recommend clinically treating her for endocarditis. TEE will not provide additional diagnostic information. Will stop heparin. Given her improved dypsnea, I do not see a need for acute right heart catheterization at this time.  Time Spent Directly with Patient:  15 minutes  Length of Stay:  LOS: 3 days   Chrystie Nose, MD, [facility] Attending Cardiologist [facility]  Traniyah Hallett C 10/01/2011, 10:29 AM

## 2011-10-01 NOTE — Progress Notes (Signed)
CBG AT LUNCH TIME= 68. PT ASYMPTOMATIC. JUICE AND TRAY GIVEN. CBG = 95

## 2011-10-01 NOTE — Progress Notes (Addendum)
Subjective: Feels much better, no chest pain, no shortness of breath at rest.  Objective: Vital signs in last 24 hours: Temp:  [97.4 F (36.3 C)-98.9 F (37.2 C)] 97.4 F (36.3 C) (05/11 0457) Pulse Rate:  [47-75] 57  (05/11 0457) Resp:  [13-20] 20  (05/11 0457) BP: (142-197)/(30-62) 178/49 mmHg (05/11 0457) SpO2:  [93 %-100 %] 100 % (05/11 0457) Weight:  [82.9 kg (182 lb 12.2 oz)] 82.9 kg (182 lb 12.2 oz) (05/10 2103) Weight change: -2.9 kg (-6 lb 6.3 oz) Last BM Date: 09/28/11  Intake/Output from previous day: 05/10 0701 - 05/11 0700 In: 1506 [P.O.:600; I.V.:704; IV Piggyback:202] Out: -      Physical Exam: General: Comfortable, alert, communicative, fully oriented, not short of breath at rest.  HEENT:  Mild clinical pallor, no jaundice, no conjunctival injection or discharge. Hydration status appears fair. NECK:  Supple, JVP not seen, no carotid bruits, no palpable lymphadenopathy, no palpable goiter. CHEST:  Clinically clear to auscultation, no wheezes, no crackles. HEART:  Sounds 1 and 2 heard, normal, regular, no murmurs. ABDOMEN:  Moderately obese, soft, non-tender, no palpable organomegaly, no palpable masses, normal bowel sounds. GENITALIA:  Not examined. LOWER EXTREMITIES:  No pitting edema, palpable peripheral pulses. MUSCULOSKELETAL SYSTEM:  Unremarkable. CENTRAL NERVOUS SYSTEM:  No focal neurologic deficit on gross examination.  Lab Results:  Basename 10/01/11 0600 09/30/11 0450  WBC 14.2* 14.1*  HGB 10.2* 9.7*  HCT 29.7* 28.9*  PLT 259 206    Basename 09/30/11 0450 09/29/11 0700  NA 132* 135  K 4.8 4.1  CL 102 102  CO2 22 18*  GLUCOSE 190* 126*  BUN 48* 53*  CREATININE 1.60* 1.85*  CALCIUM 8.9 8.6   Recent Results (from the past 240 hour(s))  CULTURE, BLOOD (ROUTINE X 2)     Status: Normal (Preliminary result)   Collection Time   09/28/11  6:48 PM      Component Value Range Status Comment   Specimen Description BLOOD ARM LEFT   Final    Special  Requests BOTTLES DRAWN AEROBIC ONLY 10CC   Final    Culture  Setup Time [id number]   Final    Culture     Final    Value: STREPTOCOCCUS SPECIES     Note: Gram Stain Report Called to,Read Back By and Verified With: [facility] @ 1806 ON 09/29/11 BY GOLLD   Report Status PENDING   Incomplete   CULTURE, BLOOD (ROUTINE X 2)     Status: Normal (Preliminary result)   Collection Time   09/28/11  7:01 PM      Component Value Range Status Comment   Specimen Description BLOOD ARM RIGHT   Final    Special Requests BOTTLES DRAWN AEROBIC ONLY 10CC   Final    Culture  Setup Time [id number]   Final    Culture     Final    Value: STREPTOCOCCUS SPECIES     Note: Gram Stain Report Called to,Read Back By and Verified With: [facility] @ 1806 ON 09/29/11 BY GOLLD   Report Status PENDING   Incomplete   MRSA PCR SCREENING     Status: Normal   Collection Time   09/29/11  5:26 AM      Component Value Range Status Comment   MRSA by PCR NEGATIVE  NEGATIVE  Final   URINE CULTURE     Status: Normal   Collection Time   09/29/11  6:24 AM      Component Value Range  Status Comment   Specimen Description URINE, RANDOM   Final    Special Requests NONE   Final    Culture  Setup Time [id number]   Final    Colony Count 20,OOO COLONIES/ML   Final    Culture     Final    Value: Multiple bacterial morphotypes present, none predominant. Suggest appropriate recollection if clinically indicated.   Report Status 09/30/2011 FINAL   Final   CULTURE, BLOOD (ROUTINE X 2)     Status: Normal (Preliminary result)   Collection Time   09/29/11  8:00 PM      Component Value Range Status Comment   Specimen Description BLOOD RIGHT ARM   Final    Special Requests BOTTLES DRAWN AEROBIC ONLY 10CC   Final    Culture  Setup Time [id number]   Final    Culture     Final    Value: GRAM POSITIVE COCCI IN PAIRS AND CHAINS     Note: Gram Stain Report Called to,Read Back By and Verified With: MEREDITH SMITH @ 1829 ON 09/30/11 BY GOLLD   Report  Status PENDING   Incomplete   CULTURE, BLOOD (ROUTINE X 2)     Status: Normal (Preliminary result)   Collection Time   09/29/11  8:03 PM      Component Value Range Status Comment   Specimen Description BLOOD LEFT ARM   Final    Special Requests BOTTLES DRAWN AEROBIC AND ANAEROBIC 10CC   Final    Culture  Setup Time [id number]   Final    Culture     Final    Value: GRAM POSITIVE COCCI IN PAIRS AND CHAINS     Note: Gram Stain Report Called to,Read Back By and Verified With: ELLEN BETHEL 09/30/11 13:40 BT GARRS   Report Status PENDING   Incomplete      Studies/Results: Nm Myocar Multi W/spect W/wall Motion / Ef  09/30/2011  *RADIOLOGY REPORT*  Clinical Data:  Chest pain  MYOCARDIAL IMAGING WITH SPECT (REST AND PHARMACOLOGIC-STRESS) GATED LEFT VENTRICULAR WALL MOTION STUDY LEFT VENTRICULAR EJECTION FRACTION  Technique:  Standard myocardial SPECT imaging was performed after resting intravenous injection of 10 mCi Tc-45m tetrofosmin. Subsequently, intravenous infusion of Lexiscan was performed under the supervision of the Cardiology staff.  At peak effect of the drug, 30 mCi Tc-70m tetrofosmin was injected intravenously and standard myocardial SPECT  imaging was performed.  Quantitative gated imaging was also performed to evaluate left ventricular wall motion, and estimate left ventricular ejection fraction.  Comparison:  None.  Findings:  Spect:  No perfusion defects.  Wall motion:  Normal motion.  Ejection fraction:  61%.  End diastolic volume 93 ml.  End-systolic volume 37 ml.  IMPRESSION: No perfusion defects.  Original Report Authenticated By: Donavan Burnet, M.D.    Medications: Scheduled Meds:   . amLODipine  7.5 mg Oral Daily  . cloNIDine  0.3 mg Transdermal Weekly  . clopidogrel  75 mg Oral Q breakfast  . escitalopram  20 mg Oral Daily  . insulin aspart  0-15 Units Subcutaneous TID WC  . insulin aspart  0-5 Units Subcutaneous QHS  . insulin aspart  4 Units Subcutaneous TID WC  .  insulin glargine  62 Units Subcutaneous Daily  . isosorbide mononitrate  60 mg Oral Daily  . levothyroxine  112 mcg Oral Q breakfast  . nebivolol  2.5 mg Oral Daily  . omega-3 acid ethyl esters  2 g Oral BID  . pantoprazole  40  mg Oral Daily  . predniSONE  20 mg Oral Q breakfast  . regadenoson  0.4 mg Intravenous Once  . sodium chloride  3 mL Intravenous Q12H  . vancomycin  1,000 mg Intravenous Q24H  . DISCONTD: insulin aspart  0-9 Units Subcutaneous TID WC   Continuous Infusions:   . sodium chloride 20 mL/hr at 09/29/11 1200  . heparin 1,200 Units/hr (09/30/11 2034)   PRN Meds:.acetaminophen, acetaminophen, ALPRAZolam, hydrALAZINE, ondansetron (ZOFRAN) IV, ondansetron, oxyCODONE-acetaminophen, technetium tetrofosmin, technetium tetrofosmin  Assessment/Plan:  1. Elevated troponin: Patient presented with weakness, dyspnea and elevated cardiac enzymes, against a background of ARF, due to dehydration, from G/enteritis and diuretics. Cardiology consultation was provided by Dr Thurmon Fair. Patient is s/p cardiac catheterization in 2012 with normal coronary arteries and LV function; and apparently has also undergone right and left heart catheterization in the past year at [facility] for evaluation of her pulmonary hypertension. Stress Myoview of 09/30/11, showed no perfusion defects, normal wall motion motion, ejection fraction: 61%. End diastolic volume 93 ml. End-systolic volume 37 ml. Renal function is militating against contrast procedures at this time, but we are managing per cardiology recommendations. Per cardiologist, negative Myoview re-enforces recent coronary angiography confirming that positive Troponin is likely not ACS, rather due to pressure/?volume overload of RV.  2. Nausea with vomiting and Diarrhea: Patient had 3-4 days of vomiting/diarrhea, pre--admission. This has resolved, with no further GI symptoms documented during this hospitalization. C. Diff PCR and enteric precautions have  therefore, been discontinued. Likely, this was a self-limited acute viral gastro-enteritis. 3. Volume depletion, gastrointestinal loss /Acute renal failure:  Patient's creatinine was 2.20 on admission, with BUN of 57, consistent with ARF. This was secondary to volume loss, due to gastroenteritis and ongoing Lasix treatment. Diuretics and ARB were discontinued, patient was managed with ivi fluids, with satisfactory response. As of 09/30/11, creatinine was much improved at 1.60. Baseline creatinine was 1.2-1.8 one year prior. IV fluids were discontinued on 09/30/11.  4. Leukocytosis/Fever/Bacteremia- preliminary:  Patient presented with a pyrexia, weakness and a leukocytosis of 23,800. Septic work up was commenced, and Vancomycin started (now day# 3), when blood cultures grew Streptococcus species in 4:4 blood cultures. Wcc is now 14, 200, and 2D Echocardiogram of 09/30/11, showed normal left ventricular cavity size, moderate LVH, ejection fraction of 55% to 60% and no regional wall motion abnormalities. Unusual filamentous mobile echogenicities are seen on the mitral and tricuspid valve, without associated valvular hemodynamically significant abnormalities. Both atria are mildly dilated. PA peak pressure: 63mm Hg (S). 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 She can probably be treated with cephalosporin therapy at this point, but may require a TEE for completeness. Will consult ID. Clinically, patient feels much better.   5. Pulmonary HTN-severe- due to diastolic dysfunction  Patient has undergone extensive evaluation at [facility] and based on recent right heart catheterization the etiology to her pulmonary hypertension was due to diastolic dysfunction. Continue afterload reduction with nitrates and current IV heparin until this is clarified the cardiology. Patient may require long-term anticoagulation.  6. 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 Patient was recently evaluated by ENT and started on a two-week course of prednisone 20 mg daily, which we have continued. 7. 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  Continue plavix  8. HYPERLIPIDEMIA:   History of statin  intolerance. See # 6. Continue omega-3 fatty acids  9. HYPOTHYROIDISM  Continue levothyroxine.  10. HYPERTENSION: Currently on multiple antihypertensives, ie, Amlodipine, Clonidine, Imdur as well as beta blocker. Still sub-optimally controlled.  11. COPD/Obstructive sleep apnea on nocturnal CPAP:  Clinically stable. Placed on nocturnal CPAP. 12. Type 2 Diabetes Mellitus: Patient's insulin-requiring type 2 diabetes, is well controlled with diet, SSI and scheduled Lantus.   LOS: 3 days   Walid Haig,CHRISTOPHER 10/01/2011, 8:37 AM

## 2011-10-01 NOTE — Progress Notes (Signed)
Pt does not want to wear CPAP while in the hospital. She states that she doesn't like the masks available here. Informed pt she could bring in her mask from home, however, pt would prefer to wear only the nasal cannula while in the hospital.

## 2011-10-02 ENCOUNTER — Inpatient Hospital Stay (HOSPITAL_COMMUNITY): Payer: BC Managed Care – PPO

## 2011-10-02 DIAGNOSIS — J13 Pneumonia due to Streptococcus pneumoniae: Secondary | ICD-10-CM

## 2011-10-02 DIAGNOSIS — E1165 Type 2 diabetes mellitus with hyperglycemia: Secondary | ICD-10-CM

## 2011-10-02 DIAGNOSIS — N179 Acute kidney failure, unspecified: Secondary | ICD-10-CM

## 2011-10-02 DIAGNOSIS — D7289 Other specified disorders of white blood cells: Secondary | ICD-10-CM

## 2011-10-02 LAB — CBC
HCT: 21.4 % — ABNORMAL LOW (ref 36.0–46.0)
Hemoglobin: 7.4 g/dL — ABNORMAL LOW (ref 12.0–15.0)
MCV: 89.5 fL (ref 78.0–100.0)
RDW: 13.2 % (ref 11.5–15.5)
WBC: 11.8 10*3/uL — ABNORMAL HIGH (ref 4.0–10.5)

## 2011-10-02 LAB — GLUCOSE, CAPILLARY
Glucose-Capillary: 139 mg/dL — ABNORMAL HIGH (ref 70–99)
Glucose-Capillary: 78 mg/dL (ref 70–99)
Glucose-Capillary: 133 mg/dL — ABNORMAL HIGH (ref 70–99)
Glucose-Capillary: 253 mg/dL — ABNORMAL HIGH (ref 70–99)

## 2011-10-02 LAB — BASIC METABOLIC PANEL
BUN: 41 mg/dL — ABNORMAL HIGH (ref 6–23)
Chloride: 101 mEq/L (ref 96–112)
Creatinine, Ser: 1.64 mg/dL — ABNORMAL HIGH (ref 0.50–1.10)
GFR calc Af Amer: 37 mL/min — ABNORMAL LOW (ref >90)
Glucose, Bld: 160 mg/dL — ABNORMAL HIGH (ref 70–99)
Potassium: 4.6 mEq/L (ref 3.5–5.1)

## 2011-10-02 LAB — HEMOGLOBIN AND HEMATOCRIT, BLOOD
HCT: 35.4 % — ABNORMAL LOW (ref 36.0–46.0)
Hemoglobin: 12.2 g/dL (ref 12.0–15.0)

## 2011-10-02 MED ORDER — NEBIVOLOL HCL 2.5 MG PO TABS
2.5000 mg | ORAL_TABLET | Freq: Every day | ORAL | Status: DC
  Administered 2011-10-02 – 2011-10-06 (×4): 2.5 mg via ORAL
  Filled 2011-10-02 (×5): qty 1

## 2011-10-02 MED ORDER — ALBUTEROL SULFATE (5 MG/ML) 0.5% IN NEBU
2.5000 mg | INHALATION_SOLUTION | Freq: Four times a day (QID) | RESPIRATORY_TRACT | Status: DC | PRN
  Administered 2011-10-03: 2.5 mg via RESPIRATORY_TRACT
  Filled 2011-10-02: qty 0.5

## 2011-10-02 MED ORDER — AMLODIPINE BESYLATE 10 MG PO TABS
10.0000 mg | ORAL_TABLET | Freq: Every day | ORAL | Status: DC
  Administered 2011-10-02: 7.5 mg via ORAL
  Administered 2011-10-03 – 2011-10-06 (×4): 10 mg via ORAL
  Filled 2011-10-02 (×5): qty 1

## 2011-10-02 MED ORDER — CLONIDINE HCL 0.1 MG/24HR TD PTWK
0.1000 mg | MEDICATED_PATCH | TRANSDERMAL | Status: DC
  Administered 2011-10-02: 0.1 mg via TRANSDERMAL
  Filled 2011-10-02: qty 1

## 2011-10-02 NOTE — Progress Notes (Signed)
Patient ID: [name], female   DOB: September 12, 1945, 66 y.o.   MRN: [id number]  INFECTIOUS DISEASE PROGRESS NOTE    Date of Admission:  09/28/2011           Day 4 total antibiotics        Day 2 ceftriaxone Principal Problem:  *Streptococcal endocarditis Active Problems:  HYPOTHYROIDISM  HYPERLIPIDEMIA  HYPERTENSION  COPD UNSPECIFIED  VERTIGO  Obstructive sleep apnea on nocturnal CPAP  Leukocytosis  Acute renal failure  Elevated troponin  Pulmonary HTN-severe- due to diastolic dysfunction  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  Nausea with vomiting  Diarrhea  Volume depletion, gastrointestinal loss  Normocytic anemia  Hyperglycemia  Subjective: She says that she still feels weak but he she is feeling much better than upon admission  Objective: Temp:  [97.5 F (36.4 C)-98.2 F (36.8 C)] 97.8 F (36.6 C) (05/12 1300) Pulse Rate:  [42-66] 46  (05/12 1300) Resp:  [18-19] 19  (05/12 1300) BP: (129-180)/(49-74) 151/49 mmHg (05/12 1300) SpO2:  [95 %-100 %] 96 % (05/12 1300) Weight:  [81.7 kg (180 lb 1.9 oz)] 81.7 kg (180 lb 1.9 oz) (05/11 2100)  General: Alert and in no distress Skin: No splinter or conjunctival hemorrhages Lungs: Clear Cor: Regular S1 and S2 with no murmurs. She is bradycardic Abdomen: Nontender  Microbiology: Recent Results (from the past 240 hour(s))  CULTURE, BLOOD (ROUTINE X 2)     Status: Normal   Collection Time   09/28/11  6:48 PM      Component Value Range Status Comment   Specimen Description BLOOD ARM LEFT   Final    Special Requests BOTTLES DRAWN AEROBIC ONLY 10CC   Final    Culture  Setup Time [id number]   Final    Culture     Final    Value: STREPTOCOCCUS SPECIES     Note: SUSCEPTIBILITIES PERFORMED ON PREVIOUS CULTURE WITHIN THE LAST 5 DAYS.     Note: Gram Stain Report Called to,Read Back By and Verified With: LINDA [facility] @ 1806 ON 09/29/11 BY GOLLD   Report Status 10/01/2011 FINAL   Final   CULTURE,  BLOOD (ROUTINE X 2)     Status: Normal   Collection Time   09/28/11  7:01 PM      Component Value Range Status Comment   Specimen Description BLOOD ARM RIGHT   Final    Special Requests BOTTLES DRAWN AEROBIC ONLY 10CC   Final    Culture  Setup Time [id number]   Final    Culture     Final    Value: STREPTOCOCCUS SPECIES     Note: Identified as Streptococcus gallolyticus     Note: Gram Stain Report Called to,Read Back By and Verified With: [facility] @ 1806 ON 09/29/11 BY GOLLD   Report Status 10/01/2011 FINAL   Final    Organism ID, Bacteria STREPTOCOCCUS SPECIES   Final   MRSA PCR SCREENING     Status: Normal   Collection Time   09/29/11  5:26 AM      Component Value Range Status Comment   MRSA by PCR NEGATIVE  NEGATIVE  Final   URINE CULTURE     Status: Normal   Collection Time   09/29/11  6:24 AM      Component Value Range Status Comment   Specimen Description URINE, RANDOM   Final    Special Requests NONE   Final    Culture  Setup Time [id number]   Final  Colony Count 20,OOO COLONIES/ML   Final    Culture     Final    Value: Multiple bacterial morphotypes present, none predominant. Suggest appropriate recollection if clinically indicated.   Report Status 09/30/2011 FINAL   Final   CULTURE, BLOOD (ROUTINE X 2)     Status: Normal (Preliminary result)   Collection Time   09/29/11  8:00 PM      Component Value Range Status Comment   Specimen Description BLOOD RIGHT ARM   Final    Special Requests BOTTLES DRAWN AEROBIC ONLY 10CC   Final    Culture  Setup Time [id number]   Final    Culture     Final    Value: STREPTOCOCCUS SPECIES     Note: Gram Stain Report Called to,Read Back By and Verified With: MEREDITH SMITH @ 1829 ON 09/30/11 BY GOLLD   Report Status PENDING   Incomplete   CULTURE, BLOOD (ROUTINE X 2)     Status: Normal (Preliminary result)   Collection Time   09/29/11  8:03 PM      Component Value Range Status Comment   Specimen Description BLOOD LEFT ARM   Final     Special Requests BOTTLES DRAWN AEROBIC AND ANAEROBIC 10CC   Final    Culture  Setup Time [id number]   Final    Culture     Final    Value: STREPTOCOCCUS SPECIES     Note: Gram Stain Report Called to,Read Back By and Verified With: ELLEN BETHEL 09/30/11 13:40 BT [facility]   Report Status PENDING   Incomplete    Assessment: She is improving on therapy for 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 She is tolerating the ceftriaxone well. Blood cultures were repeated this morning and once negative she will need a PICC placed.  Plan: 1. Continue ceftriaxone 2. Await results of repeat blood cultures   Cliffton Asters, MD [facility] for Infectious Diseases [facility] [phone] pager   [phone] cell 10/02/2011, 2:51 PM

## 2011-10-02 NOTE — Progress Notes (Signed)
[facility]  DAILY PROGRESS NOTE   Subjective:  HR to 38 overnight, appears to be sinus bradycardia. No pauses. Asymptomatic.  Objective:  Temp:  [97.5 F (36.4 C)-98.3 F (36.8 C)] 98.1 F (36.7 C) (05/12 0547) Pulse Rate:  [42-52] 45  (05/12 0547) Resp:  [18-19] 18  (05/12 0547) BP: (129-177)/(37-74) 151/74 mmHg (05/12 0547) SpO2:  [95 %-99 %] 97 % (05/12 0547) Weight:  [81.7 kg (180 lb 1.9 oz)] 81.7 kg (180 lb 1.9 oz) (05/11 2100) Weight change: -1.2 kg (-2 lb 10.3 oz)  Intake/Output from previous day: 05/11 0701 - 05/12 0700 In: 1291.3 [P.O.:900; I.V.:387.3; IV Piggyback:4] Out: -   Intake/Output from this shift:    Medications: Current Facility-Administered Medications  Medication Dose Route Frequency Provider Last Rate Last Dose  . 0.9 %  sodium chloride infusion   Intravenous Continuous Russella Dar, NP 20 mL/hr at 10/02/11 0705 20 mL/hr at 10/02/11 0705  . acetaminophen (TYLENOL) tablet 650 mg  650 mg Oral Q6H PRN Eduard Clos, MD   650 mg at 09/30/11 0242   Or  . acetaminophen (TYLENOL) suppository 650 mg  650 mg Rectal Q6H PRN Eduard Clos, MD      . ALPRAZolam Prudy Feeler) tablet 0.5 mg  0.5 mg Oral QHS PRN Eduard Clos, MD   0.5 mg at 10/01/11 2145  . amLODipine (NORVASC) tablet 7.5 mg  7.5 mg Oral Daily Everett Graff, NP   7.5 mg at 10/01/11 1017  . cefTRIAXone (ROCEPHIN) 2 g in dextrose 5 % 50 mL IVPB  2 g Intravenous Q24H Cliffton Asters, MD   2 g at 10/01/11 1820  . cloNIDine (CATAPRES - Dosed in mg/24 hr) patch 0.1 mg  0.1 mg Transdermal Weekly Dwana Melena, PA      . clopidogrel (PLAVIX) tablet 75 mg  75 mg Oral Q breakfast Eduard Clos, MD   75 mg at 10/02/11 0826  . escitalopram (LEXAPRO) tablet 20 mg  20 mg Oral Daily Eduard Clos, MD   20 mg at 10/01/11 1018  . hydrALAZINE (APRESOLINE) injection 10 mg  10 mg Intravenous Q4H PRN Eduard Clos, MD      . insulin aspart (novoLOG)  injection 0-15 Units  0-15 Units Subcutaneous TID WC Russella Dar, NP   5 Units at 10/01/11 1753  . insulin aspart (novoLOG) injection 0-5 Units  0-5 Units Subcutaneous QHS Russella Dar, NP      . insulin aspart (novoLOG) injection 4 Units  4 Units Subcutaneous TID WC Russella Dar, NP   4 Units at 10/02/11 0834  . insulin glargine (LANTUS) injection 62 Units  62 Units Subcutaneous QHS Wyline Copas, PHARMD   62 Units at 10/01/11 2245  . isosorbide mononitrate (IMDUR) 24 hr tablet 60 mg  60 mg Oral Daily Eduard Clos, MD   60 mg at 10/01/11 1019  . levothyroxine (SYNTHROID, LEVOTHROID) tablet 112 mcg  112 mcg Oral Q breakfast Eduard Clos, MD   112 mcg at 10/02/11 2595  . nebivolol (BYSTOLIC) tablet 2.5 mg  2.5 mg Oral Daily Everett Graff, NP      . omega-3 acid ethyl esters (LOVAZA) capsule 2 g  2 g Oral BID Eduard Clos, MD   2 g at 10/01/11 2145  . ondansetron (ZOFRAN) tablet 4 mg  4 mg Oral Q6H PRN Eduard Clos, MD       Or  . ondansetron [facility])  injection 4 mg  4 mg Intravenous Q6H PRN Eduard Clos, MD   4 mg at 10/01/11 1950  . oxyCODONE-acetaminophen (PERCOCET) 5-325 MG per tablet 1 tablet  1 tablet Oral Q8H PRN Eduard Clos, MD   1 tablet at 10/01/11 2146  . pantoprazole (PROTONIX) EC tablet 40 mg  40 mg Oral Daily Eduard Clos, MD   40 mg at 10/01/11 1245  . predniSONE (DELTASONE) tablet 20 mg  20 mg Oral Q breakfast Russella Dar, NP   20 mg at 10/02/11 0826  . sodium chloride 0.9 % injection 3 mL  3 mL Intravenous Q12H Eduard Clos, MD   3 mL at 10/01/11 2148  . DISCONTD: cloNIDine (CATAPRES - Dosed in mg/24 hr) patch 0.3 mg  0.3 mg Transdermal Weekly Eduard Clos, MD   0.3 mg at 09/29/11 1329  . DISCONTD: heparin ADULT infusion 100 units/mL (25000 units/250 mL)  1,200 Units/hr Intravenous Continuous Calvert Cantor, MD 12 mL/hr at 09/30/11 2034 1,200 Units/hr at 09/30/11 2034  . DISCONTD: insulin glargine (LANTUS)  injection 62 Units  62 Units Subcutaneous Daily Eduard Clos, MD   62 Units at 09/30/11 1333  . DISCONTD: nebivolol (BYSTOLIC) tablet 2.5 mg  2.5 mg Oral Daily Eduard Clos, MD   2.5 mg at 10/01/11 1022  . DISCONTD: vancomycin (VANCOCIN) IVPB 1000 mg/200 mL premix  1,000 mg Intravenous Q24H Madolyn Frieze, PHARMD   1,000 mg at 09/30/11 2258    Physical Exam: General appearance: alert and no distress Neck: no adenopathy, no carotid bruit, no JVD, supple, symmetrical, trachea midline and thyroid not enlarged, symmetric, no tenderness/mass/nodules Lungs: clear to auscultation bilaterally Heart: bradycardic, s1/s2 Abdomen: soft, non-tender; bowel sounds normal; no masses,  no organomegaly Extremities: extremities normal, atraumatic, no cyanosis or edema Pulses: 2+ and symmetric Skin: pale, dry Neurologic: Grossly normal  Lab Results: Results for orders placed during the hospital encounter of 09/28/11 (from the past 48 hour(s))  CARDIAC PANEL(CRET KIN+CKTOT+MB+TROPI)     Status: Abnormal   Collection Time   09/30/11  1:12 PM      Component Value Range Comment   Total CK 164  7 - 177 (U/L)    CK, MB 19.2 (*) 0.3 - 4.0 (ng/mL) CRITICAL VALUE NOTED.  VALUE IS CONSISTENT WITH PREVIOUSLY REPORTED AND CALLED VALUE.   Troponin I 0.43 (*) <0.30 (ng/mL)    Relative Index 11.7 (*) 0.0 - 2.5    GLUCOSE, CAPILLARY     Status: Abnormal   Collection Time   09/30/11  1:17 PM      Component Value Range Comment   Glucose-Capillary 197 (*) 70 - 99 (mg/dL)   GLUCOSE, CAPILLARY     Status: Abnormal   Collection Time   09/30/11  4:17 PM      Component Value Range Comment   Glucose-Capillary 226 (*) 70 - 99 (mg/dL)   GLUCOSE, CAPILLARY     Status: Abnormal   Collection Time   09/30/11  9:16 PM      Component Value Range Comment   Glucose-Capillary 109 (*) 70 - 99 (mg/dL)   HEPARIN LEVEL (UNFRACTIONATED)     Status: Normal   Collection Time   10/01/11  6:00 AM      Component Value  Range Comment   Heparin Unfractionated 0.38  0.30 - 0.70 (IU/mL)   CBC     Status: Abnormal   Collection Time   10/01/11  6:00 AM  Component Value Range Comment   WBC 14.2 (*) 4.0 - 10.5 (K/uL) WHITE COUNT CONFIRMED ON SMEAR   RBC 3.35 (*) 3.87 - 5.11 (MIL/uL)    Hemoglobin 10.2 (*) 12.0 - 15.0 (g/dL)    HCT 16.1 (*) 09.6 - 46.0 (%)    MCV 88.7  78.0 - 100.0 (fL)    MCH 30.4  26.0 - 34.0 (pg)    MCHC 34.3  30.0 - 36.0 (g/dL)    RDW 04.5  40.9 - 81.1 (%)    Platelets 259  150 - 400 (K/uL) PLATELET COUNT CONFIRMED BY SMEAR  GLUCOSE, CAPILLARY     Status: Normal   Collection Time   10/01/11  7:19 AM      Component Value Range Comment   Glucose-Capillary 89  70 - 99 (mg/dL)   GLUCOSE, CAPILLARY     Status: Abnormal   Collection Time   10/01/11 11:35 AM      Component Value Range Comment   Glucose-Capillary 67 (*) 70 - 99 (mg/dL)   GLUCOSE, CAPILLARY     Status: Abnormal   Collection Time   10/01/11 11:36 AM      Component Value Range Comment   Glucose-Capillary 68 (*) 70 - 99 (mg/dL)   GLUCOSE, CAPILLARY     Status: Normal   Collection Time   10/01/11 11:57 AM      Component Value Range Comment   Glucose-Capillary 95  70 - 99 (mg/dL)   GLUCOSE, CAPILLARY     Status: Abnormal   Collection Time   10/01/11  4:39 PM      Component Value Range Comment   Glucose-Capillary 194 (*) 70 - 99 (mg/dL)   GLUCOSE, CAPILLARY     Status: Abnormal   Collection Time   10/01/11  5:30 PM      Component Value Range Comment   Glucose-Capillary 235 (*) 70 - 99 (mg/dL)   GLUCOSE, CAPILLARY     Status: Abnormal   Collection Time   10/01/11 10:12 PM      Component Value Range Comment   Glucose-Capillary 139 (*) 70 - 99 (mg/dL)   CBC     Status: Abnormal   Collection Time   10/02/11  1:39 AM      Component Value Range Comment   WBC 11.8 (*) 4.0 - 10.5 (K/uL)    RBC 2.39 (*) 3.87 - 5.11 (MIL/uL)    Hemoglobin 7.4 (*) 12.0 - 15.0 (g/dL)    HCT 91.4 (*) 78.2 - 46.0 (%)    MCV 89.5  78.0 - 100.0  (fL)    MCH 31.0  26.0 - 34.0 (pg)    MCHC 34.6  30.0 - 36.0 (g/dL)    RDW 95.6  21.3 - 08.6 (%)    Platelets 290  150 - 400 (K/uL)   BASIC METABOLIC PANEL     Status: Abnormal   Collection Time   10/02/11  1:39 AM      Component Value Range Comment   Sodium 135  135 - 145 (mEq/L)    Potassium 4.6  3.5 - 5.1 (mEq/L)    Chloride 101  96 - 112 (mEq/L)    CO2 20  19 - 32 (mEq/L)    Glucose, Bld 160 (*) 70 - 99 (mg/dL)    BUN 41 (*) 6 - 23 (mg/dL)    Creatinine, Ser 5.78 (*) 0.50 - 1.10 (mg/dL)    Calcium 9.0  8.4 - 10.5 (mg/dL)    GFR calc non Af Denyse Dago  32 (*) >90 (mL/min)    GFR calc Af Amer 37 (*) >90 (mL/min)   GLUCOSE, CAPILLARY     Status: Abnormal   Collection Time   10/02/11  3:49 AM      Component Value Range Comment   Glucose-Capillary 139 (*) 70 - 99 (mg/dL)   GLUCOSE, CAPILLARY     Status: Normal   Collection Time   10/02/11  7:49 AM      Component Value Range Comment   Glucose-Capillary 78  70 - 99 (mg/dL)     Imaging: Nm Myocar Multi W/spect W/wall Motion / Ef  09/30/2011  *RADIOLOGY REPORT*  Clinical Data:  Chest pain  MYOCARDIAL IMAGING WITH SPECT (REST AND PHARMACOLOGIC-STRESS) GATED LEFT VENTRICULAR WALL MOTION STUDY LEFT VENTRICULAR EJECTION FRACTION  Technique:  Standard myocardial SPECT imaging was performed after resting intravenous injection of 10 mCi Tc-14m tetrofosmin. Subsequently, intravenous infusion of Lexiscan was performed under the supervision of the Cardiology staff.  At peak effect of the drug, 30 mCi Tc-25m tetrofosmin was injected intravenously and standard myocardial SPECT  imaging was performed.  Quantitative gated imaging was also performed to evaluate left ventricular wall motion, and estimate left ventricular ejection fraction.  Comparison:  None.  Findings:  Spect:  No perfusion defects.  Wall motion:  Normal motion.  Ejection fraction:  61%.  End diastolic volume 93 ml.  End-systolic volume 37 ml.  IMPRESSION: No perfusion defects.  Original Report  Authenticated By: Donavan Burnet, M.D.    Assessment:  1. Principal Problem: 2.  *Streptococcal endocarditis 3. Active Problems: 4.  HYPOTHYROIDISM 5.  HYPERLIPIDEMIA 6.  HYPERTENSION 7.  COPD UNSPECIFIED 8.  VERTIGO 9.  Obstructive sleep apnea on nocturnal CPAP 10.  Leukocytosis 11.  Acute renal failure 12.  Elevated troponin 13.  Pulmonary HTN-severe- due to diastolic dysfunction 14.  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 15.  Nausea with vomiting 16.  Diarrhea 17.  Volume depletion, gastrointestinal loss 18.  Normocytic anemia 19.  Hyperglycemia 20. Sinus bradycardia  Plan:  Asymptomatic sinus bradycardia overnight. No pacemaker indication. Would decrease clonidine patch to 0.1 mg/hr - replace patch today. Recommend increase Norvasc to 10 mg daily - she is not allergic, but may have had swelling. Continue treatment for endocarditis per ID with ceftriaxone. Also, notable drop in H/H today. WBC declined as well.  May be a spurious value. Repeat STAT H/H, guaiac stools. Defer to medicine service.   Time Spent Directly with Patient:  15 minutes  Length of Stay:  LOS: 4 days   Chrystie Nose, MD, [facility] Attending Cardiologist [facility]  Sunday Klos C 10/02/2011, 9:02 AM

## 2011-10-02 NOTE — Progress Notes (Signed)
Patient c/o SOB and tightness around rib cage.  She also states that this is the way she felt the last time she had fluid on her lungs.  She also feels "puffy".  Upon assessment, her armbands are tighter than previous night and hands and feet appear slightly edematous.  IV fluids are NS at 20 ml/hr to keep vein open.  Upon auscultation, patient is decreased but no wheezing or rhonchi.  VS:  HR-47  P-20 BP-164/53 O2-98% on 2LPM.  [facility] contacted.  Rec'd order for portable chest xray and nebulizer treatments.  Asked to call results of chest xray to provider once received.  Will continue to monitor patient.  Stacie Glaze  10/02/2011

## 2011-10-02 NOTE — Progress Notes (Signed)
Per telemetry, patient's HR is dropping into upper 30's and low 40's.  She is asymptomatic.  Awakens easily.  A&O x 3.  VS: T-97.5, P-42, Resp -18, B/P- 129/55, 95 2LPM [place].  [facility] contacted and order parameters rec'd for medications and to call [facility] for HR less than 30 or if patient becomes symptomatic.  MD also requested that I make cardiologist aware.  Dr Judie Grieve informed and rec'd order that if patient becomes symptomatic to remove Clonidine patch.  Cardiology will see on Sunday.  10/02/2011

## 2011-10-02 NOTE — Progress Notes (Signed)
Subjective: Some nausea today, otherwise, no new issues.  Objective: Vital signs in last 24 hours: Temp:  [97.5 F (36.4 C)-98.2 F (36.8 C)] 97.8 F (36.6 C) (05/12 1300) Pulse Rate:  [42-66] 46  (05/12 1300) Resp:  [18-19] 19  (05/12 1300) BP: (129-180)/(49-74) 151/49 mmHg (05/12 1300) SpO2:  [95 %-100 %] 96 % (05/12 1300) Weight:  [81.7 kg (180 lb 1.9 oz)] 81.7 kg (180 lb 1.9 oz) (05/11 2100) Weight change: -1.2 kg (-2 lb 10.3 oz) Last BM Date: 10/01/11  Intake/Output from previous day: 05/11 0701 - 05/12 0700 In: 1291.3 [P.O.:900; I.V.:387.3; IV Piggyback:4] Out: -  Total I/O In: 120 [P.O.:120] Out: -    Physical Exam: General: Comfortable, alert, communicative, fully oriented, not short of breath at rest.  HEENT:  Mild clinical pallor, no jaundice, no conjunctival injection or discharge. Hydration status appears fair. NECK:  Supple, JVP not seen, no carotid bruits, no palpable lymphadenopathy, no palpable goiter. CHEST:  Clinically clear to auscultation, no wheezes, no crackles. HEART:  Sounds 1 and 2 heard, normal, regular, no murmurs. ABDOMEN:  Moderately obese, soft, non-tender, no palpable organomegaly, no palpable masses, normal bowel sounds. GENITALIA:  Not examined. LOWER EXTREMITIES:  No pitting edema, palpable peripheral pulses. MUSCULOSKELETAL SYSTEM:  Unremarkable. CENTRAL NERVOUS SYSTEM:  No focal neurologic deficit on gross examination.  Lab Results:  Basename 10/02/11 0925 10/02/11 0139 10/01/11 0600  WBC -- 11.8* 14.2*  HGB 12.2 7.4* --  HCT 35.4* 21.4* --  PLT -- 290 259    Basename 10/02/11 0139 09/30/11 0450  NA 135 132*  K 4.6 4.8  CL 101 102  CO2 20 22  GLUCOSE 160* 190*  BUN 41* 48*  CREATININE 1.64* 1.60*  CALCIUM 9.0 8.9   Recent Results (from the past 240 hour(s))  CULTURE, BLOOD (ROUTINE X 2)     Status: Normal   Collection Time   09/28/11  6:48 PM      Component Value Range Status Comment   Specimen Description BLOOD ARM LEFT    Final    Special Requests BOTTLES DRAWN AEROBIC ONLY 10CC   Final    Culture  Setup Time [id number]   Final    Culture     Final    Value: STREPTOCOCCUS SPECIES     Note: SUSCEPTIBILITIES PERFORMED ON PREVIOUS CULTURE WITHIN THE LAST 5 DAYS.     Note: Gram Stain Report Called to,Read Back By and Verified With: LINDA [facility] @ 1806 ON 09/29/11 BY GOLLD   Report Status 10/01/2011 FINAL   Final   CULTURE, BLOOD (ROUTINE X 2)     Status: Normal   Collection Time   09/28/11  7:01 PM      Component Value Range Status Comment   Specimen Description BLOOD ARM RIGHT   Final    Special Requests BOTTLES DRAWN AEROBIC ONLY 10CC   Final    Culture  Setup Time [id number]   Final    Culture     Final    Value: STREPTOCOCCUS SPECIES     Note: Identified as Streptococcus gallolyticus     Note: Gram Stain Report Called to,Read Back By and Verified With: [facility] @ 1806 ON 09/29/11 BY GOLLD   Report Status 10/01/2011 FINAL   Final    Organism ID, Bacteria STREPTOCOCCUS SPECIES   Final   MRSA PCR SCREENING     Status: Normal   Collection Time   09/29/11  5:26 AM      Component Value Range  Status Comment   MRSA by PCR NEGATIVE  NEGATIVE  Final   URINE CULTURE     Status: Normal   Collection Time   09/29/11  6:24 AM      Component Value Range Status Comment   Specimen Description URINE, RANDOM   Final    Special Requests NONE   Final    Culture  Setup Time [id number]   Final    Colony Count 20,OOO COLONIES/ML   Final    Culture     Final    Value: Multiple bacterial morphotypes present, none predominant. Suggest appropriate recollection if clinically indicated.   Report Status 09/30/2011 FINAL   Final   CULTURE, BLOOD (ROUTINE X 2)     Status: Normal (Preliminary result)   Collection Time   09/29/11  8:00 PM      Component Value Range Status Comment   Specimen Description BLOOD RIGHT ARM   Final    Special Requests BOTTLES DRAWN AEROBIC ONLY 10CC   Final    Culture  Setup Time [id number]   Final     Culture     Final    Value: STREPTOCOCCUS SPECIES     Note: Gram Stain Report Called to,Read Back By and Verified With: MEREDITH SMITH @ 1829 ON 09/30/11 BY GOLLD   Report Status PENDING   Incomplete   CULTURE, BLOOD (ROUTINE X 2)     Status: Normal (Preliminary result)   Collection Time   09/29/11  8:03 PM      Component Value Range Status Comment   Specimen Description BLOOD LEFT ARM   Final    Special Requests BOTTLES DRAWN AEROBIC AND ANAEROBIC 10CC   Final    Culture  Setup Time [id number]   Final    Culture     Final    Value: STREPTOCOCCUS SPECIES     Note: Gram Stain Report Called to,Read Back By and Verified With: ELLEN BETHEL 09/30/11 13:40 BT GARRS   Report Status PENDING   Incomplete      Studies/Results: No results found.  Medications: Scheduled Meds:    . amLODipine  10 mg Oral Daily  . cefTRIAXone (ROCEPHIN)  IV  2 g Intravenous Q24H  . cloNIDine  0.1 mg Transdermal Weekly  . clopidogrel  75 mg Oral Q breakfast  . escitalopram  20 mg Oral Daily  . insulin aspart  0-15 Units Subcutaneous TID WC  . insulin aspart  0-5 Units Subcutaneous QHS  . insulin aspart  4 Units Subcutaneous TID WC  . insulin glargine  62 Units Subcutaneous QHS  . isosorbide mononitrate  60 mg Oral Daily  . levothyroxine  112 mcg Oral Q breakfast  . nebivolol  2.5 mg Oral Daily  . omega-3 acid ethyl esters  2 g Oral BID  . pantoprazole  40 mg Oral Daily  . predniSONE  20 mg Oral Q breakfast  . sodium chloride  3 mL Intravenous Q12H  . DISCONTD: amLODipine  7.5 mg Oral Daily  . DISCONTD: cloNIDine  0.3 mg Transdermal Weekly  . DISCONTD: nebivolol  2.5 mg Oral Daily  . DISCONTD: vancomycin  1,000 mg Intravenous Q24H   Continuous Infusions:    . sodium chloride 20 mL/hr (10/02/11 0705)   PRN Meds:.acetaminophen, acetaminophen, ALPRAZolam, hydrALAZINE, ondansetron (ZOFRAN) IV, ondansetron, oxyCODONE-acetaminophen  Assessment/Plan:  1. Elevated troponin: Patient presented with  weakness, dyspnea and elevated cardiac enzymes, against a background of ARF, due to dehydration, from G/enteritis and diuretics. Cardiology consultation was provided by  Dr Rachelle Hora Croitoru. Patient is s/p cardiac catheterization in 2012 with normal coronary arteries and LV function; and apparently has also undergone right and left heart catheterization in the past year at [facility] for evaluation of her pulmonary hypertension. Stress Myoview of 09/30/11, showed no perfusion defects, normal wall motion motion, ejection fraction: 61%. End diastolic volume 93 ml. End-systolic volume 37 ml. Renal function militated against contrast procedures. Per cardiologist, negative Myoview re-enforces recent coronary angiography, in confirming that positive Troponin is likely not ACS, rather due to pressure/?volume overload of RV. In view of current clinical stability, and subsequent infective developments, no further cardiac work up is contemplated at this time. 2. Nausea with vomiting and Diarrhea: Patient had 3-4 days of vomiting/diarrhea, pre--admission. This has resolved, with no further GI symptoms documented during this hospitalization. C. Diff PCR and enteric precautions have therefore, been discontinued. Likely, this was a self-limited acute viral gastro-enteritis. 3. Volume depletion, gastrointestinal loss /Acute renal failure:  Patient's creatinine was 2.20 on admission, with BUN of 57, consistent with ARF. This was secondary to volume loss, due to gastroenteritis and ongoing Lasix treatment. Diuretics and ARB were discontinued, patient was managed with ivi fluids, with satisfactory response. As of 09/30/11, creatinine was much improved at 1.60. Baseline creatinine was 1.2-1.8 one year prior. IV fluids were discontinued on 09/30/11. Renal indices are now stable. 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 Septic work up was commenced, and Vancomycin started. Blood cultures  grew Streptococcus Gallolyticus (formerly Streptococcus Bovis) in 4:4 blood cultures and 2D Echocardiogram of 09/30/11, showed normal left ventricular cavity size, moderate LVH, ejection fraction of 55% to 60% and no regional wall motion abnormalities. Unusual filamentous mobile echogenicities are seen on the mitral and tricuspid valve, without associated valvular hemodynamically significant abnormalities. Both atria are mildly dilated. PA peak pressure: 63mm Hg (S). Clinically, patient has bacterial endocarditis, based on the above constellation of findings. Dr Cliffton Asters provided ID consultation, and he has recommended high dose Rocephin therapy. It is felt that TEE would not provide any further benefit.  Clinically, patient feels much better, is apyrexial, and wcc is trending down nicely. The plan is to await negative blood cultures, then establish PICC for duration of antibiotics. GiI consultation has been requested, and per Dr Erick Blinks, we can anticipate a colonoscopy on 10/04/11.  5. Pulmonary HTN-severe- due to diastolic dysfunction  Patient has undergone extensive evaluation at [facility] and based on recent right heart catheterization the etiology for her pulmonary hypertension was diastolic dysfunction. She is on afterload reduction with nitrates and anticoagulation has been discontinued on cardiology recommendations, n view of endocarditis.  6. 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 Patient was recently evaluated by ENT and started on a two-week course of prednisone 20 mg daily, which we have continued. Patient will benefit from PT/OT input. 7. 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  Continue plavix  8. HYPERLIPIDEMIA:   History of statin intolerance. See # 6. Continue omega-3 fatty acids  9.  HYPOTHYROIDISM  Continue levothyroxine.  10. HYPERTENSION: Currently on multiple antihypertensives, ie, Amlodipine, Clonidine, Imdur as well as beta blocker. Still sub-optimally controlled, but cardiologist is assisting with optimization of antihypertensive therapy.  11. COPD/Obstructive sleep apnea on nocturnal CPAP:  Clinically stable. Placed on nocturnal CPAP. 12. Type 2 Diabetes Mellitus: Patient's insulin-requiring type 2  diabetes, is well controlled with diet, SSI and scheduled Lantus.  Comment: Ambulate patient.   LOS: 4 days   Pammy Vesey,CHRISTOPHER 10/02/2011, 3:56 PM

## 2011-10-03 ENCOUNTER — Encounter (HOSPITAL_COMMUNITY): Payer: Self-pay | Admitting: Physician Assistant

## 2011-10-03 DIAGNOSIS — J13 Pneumonia due to Streptococcus pneumoniae: Secondary | ICD-10-CM

## 2011-10-03 DIAGNOSIS — N179 Acute kidney failure, unspecified: Secondary | ICD-10-CM

## 2011-10-03 DIAGNOSIS — D7289 Other specified disorders of white blood cells: Secondary | ICD-10-CM

## 2011-10-03 DIAGNOSIS — E1165 Type 2 diabetes mellitus with hyperglycemia: Secondary | ICD-10-CM

## 2011-10-03 LAB — BASIC METABOLIC PANEL
BUN: 34 mg/dL — ABNORMAL HIGH (ref 6–23)
Chloride: 105 mEq/L (ref 96–112)
GFR calc Af Amer: 45 mL/min — ABNORMAL LOW (ref >90)
GFR calc non Af Amer: 38 mL/min — ABNORMAL LOW (ref >90)
Potassium: 4.6 mEq/L (ref 3.5–5.1)
Sodium: 138 mEq/L (ref 135–145)

## 2011-10-03 LAB — GLUCOSE, CAPILLARY
Glucose-Capillary: 75 mg/dL (ref 70–99)
Glucose-Capillary: 144 mg/dL — ABNORMAL HIGH (ref 70–99)
Glucose-Capillary: 201 mg/dL — ABNORMAL HIGH (ref 70–99)

## 2011-10-03 LAB — CBC
HCT: 30.4 % — ABNORMAL LOW (ref 36.0–46.0)
MCHC: 33.6 g/dL (ref 30.0–36.0)
Platelets: 304 10*3/uL (ref 150–400)
RDW: 13.3 % (ref 11.5–15.5)
WBC: 15.2 10*3/uL — ABNORMAL HIGH (ref 4.0–10.5)

## 2011-10-03 MED ORDER — ASPIRIN 81 MG PO CHEW
81.0000 mg | CHEWABLE_TABLET | Freq: Every day | ORAL | Status: DC
  Administered 2011-10-03 – 2011-10-06 (×4): 81 mg via ORAL
  Filled 2011-10-03 (×4): qty 1

## 2011-10-03 MED ORDER — ALPRAZOLAM 0.25 MG PO TABS
0.2500 mg | ORAL_TABLET | Freq: Two times a day (BID) | ORAL | Status: DC | PRN
  Administered 2011-10-03: 0.25 mg via ORAL
  Filled 2011-10-03: qty 1

## 2011-10-03 MED ORDER — HYDRALAZINE HCL 25 MG PO TABS
25.0000 mg | ORAL_TABLET | Freq: Three times a day (TID) | ORAL | Status: DC
  Administered 2011-10-03 – 2011-10-04 (×4): 25 mg via ORAL
  Filled 2011-10-03 (×6): qty 1

## 2011-10-03 NOTE — Progress Notes (Signed)
Pt refused cpap at this time, no distress noted, no machine in room. 

## 2011-10-03 NOTE — Progress Notes (Signed)
[facility]  Subjective: A little SOB and some mild chest tightness.  Objective: Vital signs in last 24 hours: Temp:  [97.7 F (36.5 C)-98.1 F (36.7 C)] 97.7 F (36.5 C) (05/13 0525) Pulse Rate:  [45-66] 61  (05/13 0525) Resp:  [18-20] 18  (05/13 0525) BP: (151-180)/(43-56) 170/44 mmHg (05/13 0525) SpO2:  [96 %-100 %] 98 % (05/13 0525) Weight:  [82.464 kg (181 lb 12.8 oz)] 82.464 kg (181 lb 12.8 oz) (05/12 2134) Last BM Date: 10/02/11  Intake/Output from previous day: 05/12 0701 - 05/13 0700 In: 1200 [P.O.:960; I.V.:240] Out: -  Intake/Output this shift:    Medications Current Facility-Administered Medications  Medication Dose Route Frequency Provider Last Rate Last Dose  . 0.9 %  sodium chloride infusion   Intravenous Continuous Russella Dar, NP 20 mL/hr at 10/02/11 2035 20 mL/hr at 10/02/11 2035  . acetaminophen (TYLENOL) tablet 650 mg  650 mg Oral Q6H PRN Eduard Clos, MD   650 mg at 09/30/11 0242   Or  . acetaminophen (TYLENOL) suppository 650 mg  650 mg Rectal Q6H PRN Eduard Clos, MD      . albuterol (PROVENTIL) (5 MG/ML) 0.5% nebulizer solution 2.5 mg  2.5 mg Nebulization Q6H PRN Everett Graff, NP   2.5 mg at 10/03/11 0450  . ALPRAZolam Prudy Feeler) tablet 0.5 mg  0.5 mg Oral QHS PRN Eduard Clos, MD   0.5 mg at 10/02/11 2218  . amLODipine (NORVASC) tablet 10 mg  10 mg Oral Daily Chrystie Nose, MD   7.5 mg at 10/02/11 1100  . cefTRIAXone (ROCEPHIN) 2 g in dextrose 5 % 50 mL IVPB  2 g Intravenous Q24H Cliffton Asters, MD   2 g at 10/02/11 1732  . cloNIDine (CATAPRES - Dosed in mg/24 hr) patch 0.1 mg  0.1 mg Transdermal Weekly Dwana Melena, PA   0.1 mg at 10/02/11 1055  . clopidogrel (PLAVIX) tablet 75 mg  75 mg Oral Q breakfast Eduard Clos, MD   75 mg at 10/03/11 0839  . escitalopram (LEXAPRO) tablet 20 mg  20 mg Oral Daily Eduard Clos, MD   20 mg at 10/02/11 1056  . hydrALAZINE (APRESOLINE) injection  10 mg  10 mg Intravenous Q4H PRN Eduard Clos, MD      . insulin aspart (novoLOG) injection 0-15 Units  0-15 Units Subcutaneous TID WC Russella Dar, NP   8 Units at 10/02/11 1757  . insulin aspart (novoLOG) injection 0-5 Units  0-5 Units Subcutaneous QHS Russella Dar, NP      . insulin aspart (novoLOG) injection 4 Units  4 Units Subcutaneous TID WC Russella Dar, NP   4 Units at 10/02/11 1758  . insulin glargine (LANTUS) injection 62 Units  62 Units Subcutaneous QHS Wyline Copas, PHARMD   62 Units at 10/02/11 2218  . isosorbide mononitrate (IMDUR) 24 hr tablet 60 mg  60 mg Oral Daily Eduard Clos, MD   60 mg at 10/02/11 1056  . levothyroxine (SYNTHROID, LEVOTHROID) tablet 112 mcg  112 mcg Oral Q breakfast Eduard Clos, MD   112 mcg at 10/03/11 0839  . nebivolol (BYSTOLIC) tablet 2.5 mg  2.5 mg Oral Daily Everett Graff, NP   2.5 mg at 10/02/11 1056  . omega-3 acid ethyl esters (LOVAZA) capsule 2 g  2 g Oral BID Eduard Clos, MD   2 g at 10/02/11 2218  . ondansetron (ZOFRAN) tablet 4 mg  4 mg Oral Q6H PRN Eduard Clos, MD       Or  . ondansetron [facility]) injection 4 mg  4 mg Intravenous Q6H PRN Eduard Clos, MD   4 mg at 10/01/11 1950  . oxyCODONE-acetaminophen (PERCOCET) 5-325 MG per tablet 1 tablet  1 tablet Oral Q8H PRN Eduard Clos, MD   1 tablet at 10/01/11 2146  . pantoprazole (PROTONIX) EC tablet 40 mg  40 mg Oral Daily Eduard Clos, MD   40 mg at 10/02/11 1057  . predniSONE (DELTASONE) tablet 20 mg  20 mg Oral Q breakfast Russella Dar, NP   20 mg at 10/03/11 0839  . sodium chloride 0.9 % injection 3 mL  3 mL Intravenous Q12H Eduard Clos, MD   3 mL at 10/01/11 2148  . DISCONTD: amLODipine (NORVASC) tablet 7.5 mg  7.5 mg Oral Daily Everett Graff, NP   7.5 mg at 10/01/11 1017    PE: General appearance: alert, No distress Lungs: clear to auscultation bilaterally Heart: regular rate and rhythm Abdomen: +BS in all  quads,  2/10 tenderness in suprapubic area. Extremities: No LEE Pulses: 2+ and symmetric Neuro:  Grossly normal.  Lab Results:   Basename 10/03/11 0600 10/02/11 0925 10/02/11 0139 10/01/11 0600  WBC 15.2* -- 11.8* 14.2*  HGB 10.2* 12.2 7.4* --  HCT 30.4* 35.4* 21.4* --  PLT 304 -- 290 259   BMET  Basename 10/03/11 0600 10/02/11 0139  NA 138 135  K 4.6 4.6  CL 105 101  CO2 23 20  GLUCOSE 112* 160*  BUN 34* 41*  CREATININE 1.42* 1.64*  CALCIUM 9.4 9.0    PORTABLE CHEST - 1 VIEW  Comparison: 09/28/2011  Findings: Mild cardiac enlargement with normal pulmonary  vascularity. Emphysematous changes with interstitial fibrosis  suggested. No blunting of costophrenic angles. No focal airspace  consolidation. No pneumothorax.  IMPRESSION:  Stable cardiac enlargement. No evidence of vascular congestion or  edema. No focal airspace disease.   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  Sinus brady to the 40's at night.  Clonidine decreased to 0.1 TD.  Continue for one week then DC.  Further improvement in SCr.  BP elvated. Adding Hydralazine PO   LOS: 5 days    Alfonso Carden W 10/03/2011 9:18 AM

## 2011-10-03 NOTE — Progress Notes (Signed)
Brief ID note.  Repeat blood cultures remain negative.   I would be comfortable placing PICC line after 72 hours negative.  Will then need 4 weeks of ceftriaxone.

## 2011-10-03 NOTE — Consult Note (Signed)
[facility]  Consult:1:01 PM 10/03/2011   Referring Provider: Dr Brien Few Primary Care Physician:  Alice Reichert, MD, MD Primary Gastroenterologist:  none  Reason for Consultation:  Strep galalytticus positive blood cultures,  Needs colonoscopy to rule out colon malignancy.   HPI: [name] is a 66 y.o. female with IDDM.  Admitted 6 days ago with weakness, diarrhea, fever, chest pain.  Background of IDDM and distolic heart dysfunction. Multiple blood cultures growing the Strep G.  This is a subtype of  Step Bovis.  Given strong positive association between this organism and colon cancer,  MD would like her to undergo screening colonoscopy.  She has never had this, nor EGD before. 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   Pt has 3 months of soft to loose stools, not bloody, but she has post prandial urgency to eliminate.  Stools are "rabbit -like", ie small pieces but sometimes diarrheal.  No weight loss.  No belly pain.  No nausea, emesis.    Course of Prednisone late last year over several weeks for Statin-induced myositis.  The weakness of this had recurred and MD had restarted her on 10 mg of Prednisone recently but it did not help. Current dose is 20 mg daily  Nexium eliminates her heartburn.  No dysphagia.  Wt is up 40 # over the last several months.   On Plavix at home.  It continues currently.      Past Medical History  Diagnosis Date  . HTN (hypertension)   . Heart attack   . CHF (congestive heart failure)   . Diabetes mellitus   . High cholesterol   . Kidney disease   . Cataracts, bilateral   . 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 Obstructive sleep apnea on nocturnal CPAP 06/20/2008    Qualifier: Diagnosis of  By: Vernie Murders    . Pulmonary HTN-severe- due to diastolic dysfunction 09/29/2011  . Acute renal failure 09/29/2011  . Proximal muscle weakness-felt related to  statin-induced myositis-recurrent 09/29/2011  . HYPOTHYROIDISM 06/19/2008    Qualifier: Diagnosis of  By: Excell Seltzer CMA, Lawson Fiscal    . Renal artery stenosis     s/p stents (restenosis)  . Subclavian arterial stenosis   . Peripheral neuropathy      Past Surgical History  Procedure Date  . Cardiac catheterization   . Laparoscopic tubal ligation     Prior to Admission medications   Medication Sig Start Date End Date Taking? Authorizing Provider  ALPRAZolam Prudy Feeler) 0.5 MG tablet Take 0.5 mg by mouth at bedtime as needed. For anxiety   Yes Historical Provider, MD  amLODipine (NORVASC) 5 MG tablet Take 7.5 mg by mouth daily.    Yes Historical Provider, MD  aspirin 81 MG tablet Take 81 mg by mouth daily.     Yes Historical Provider, MD  Cholecalciferol (VITAMIN D3) 2000 UNITS TABS Take 1 tablet by mouth at bedtime.     Yes Historical Provider, MD  cloNIDine (CATAPRES - DOSED IN MG/24 HR) 0.3 mg/24hr Place 1 patch onto the skin once a week. Take on mondays   Yes Historical Provider, MD  clopidogrel (PLAVIX) 75 MG tablet Take 75 mg by mouth daily.     Yes Historical Provider, MD  escitalopram (LEXAPRO) 20 MG tablet Take 20 mg by mouth daily.     Yes Historical Provider, MD  esomeprazole (NEXIUM) 40 MG capsule Take 40 mg by mouth daily as needed. For reflux   Yes Historical Provider, MD  furosemide (LASIX) 80 MG tablet Take 80 mg by mouth 2 (two) times daily.     Yes Historical Provider, MD  insulin glargine (LANTUS) 100 UNIT/ML injection Inject 62 Units into the skin daily.    Yes Historical Provider, MD  insulin lispro (HUMALOG) 100 UNIT/ML injection Inject 10-20 Units into the skin 3 (three) times daily with meals. 10 units with breakfast, 10 units with lunch, 20 units with dinner   Yes Historical Provider, MD  isosorbide mononitrate (IMDUR) 60 MG 24 hr tablet Take 60 mg by mouth daily.     Yes Historical Provider, MD  levothyroxine (SYNTHROID, LEVOTHROID) 112 MCG tablet Take 112 mcg by mouth daily.      Yes Historical Provider, MD  nebivolol (BYSTOLIC) 2.5 MG tablet Take 2.5 mg by mouth daily.     Yes Historical Provider, MD  omega-3 acid ethyl esters (LOVAZA) 1 G capsule Take 2 g by mouth 2 (two) times daily.   Yes Historical Provider, MD  oxyCODONE-acetaminophen (PERCOCET) 5-325 MG per tablet Take 2 tablets by mouth every 8 (eight) hours as needed for pain. 09/27/11 10/07/11 Yes Gerhard Munch, MD  potassium chloride SA (K-DUR,KLOR-CON) 20 MEQ tablet Take 20 mEq by mouth 2 (two) times daily.     Yes Historical Provider, MD  valsartan (DIOVAN) 160 MG tablet Take 160 mg by mouth 2 (two) times daily.   Yes Historical Provider, MD    Scheduled Meds:    . amLODipine  10 mg Oral Daily  . cefTRIAXone (ROCEPHIN)  IV  2 g Intravenous Q24H  . cloNIDine  0.1 mg Transdermal Weekly  . clopidogrel  75 mg Oral Q breakfast  . escitalopram  20 mg Oral Daily  . hydrALAZINE  25 mg Oral Q8H  . insulin aspart  0-15 Units Subcutaneous TID WC  . insulin aspart  0-5 Units Subcutaneous QHS  . insulin aspart  4 Units Subcutaneous TID WC  . insulin glargine  62 Units Subcutaneous QHS  . isosorbide mononitrate  60 mg Oral Daily  . levothyroxine  112 mcg Oral Q breakfast  . nebivolol  2.5 mg Oral Daily  . omega-3 acid ethyl esters  2 g Oral BID  . pantoprazole  40 mg Oral Daily  . predniSONE  20 mg Oral Q breakfast  . sodium chloride  3 mL Intravenous Q12H   Infusions:    . sodium chloride 20 mL/hr (10/02/11 2035)   PRN Meds: acetaminophen, acetaminophen, albuterol, ALPRAZolam, hydrALAZINE, ondansetron (ZOFRAN) IV, ondansetron, oxyCODONE-acetaminophen   Allergies as of 09/28/2011 - Review Complete 09/28/2011  Allergen Reaction Noted  . Amlodipine besylate    . Atenolol    . Bystolic (nebivolol hcl)  02/03/2011  . Ciprofloxacin    . Codeine    . Lidocaine    . Metformin    . Metoprolol    . Penicillins      Family History  Problem Relation Age of Onset  . Heart disease Mother   . Heart  disease Father   . Heart disease Sister   . Heart disease Daughter     History   Social History  . Marital Status: Married    Spouse Name: N/A    Number of Children: N/A  . Years of Education: N/A   Occupational History  . retired    Social History Main Topics  . Smoking status: Former Smoker -- 1.0 packs/day for 25 years    Types: Cigarettes    Quit date: 05/23/2004  . Smokeless tobacco: Not  on file  . Alcohol Use: No  . Drug Use: No  . Sexually Active: Not on file   Other Topics Concern  . Not on file   Social History Narrative  . No narrative on file    REVIEW OF SYSTEMS: Constitutional:  Weakness improved c/w PTA ENT:  No nose bleeds Pulm:  No cough, no pleuritic pain CV:  Chronic exertional dyspnea GU:  No hematuria or dysuria, no frequency GI:  As per HPI Heme:  No prior issue with anemia.    Transfusions:  None ever Neuro:  No headache, no seizure Derm:  No rash , sores or itching Endocrine:  Sugars recently more elevated due to prednisone.  Immunization:  Flu,pneumovax up to date.  Travel:  None.  [place]ives [place] miles from border [place]   PHYSICAL EXAM: Vital signs in last 24 hours: Temp:  [97.6 F (36.4 C)-98.1 F (36.7 C)] 97.6 F (36.4 C) (05/13 1259) Pulse Rate:  [45-63] 63  (05/13 1259) Resp:  [18-20] 18  (05/13 1259) BP: (153-170)/(43-77) 163/77 mmHg (05/13 1259) SpO2:  [98 %-99 %] 98 % (05/13 1259) Weight:  [181 lb 12.8 oz (82.464 kg)] 181 lb 12.8 oz (82.464 kg) (05/12 2134)  General: cushingoid.  Looks pale, unwell but not toxic Head:  No signs trauma.  Face symmetric.  Eyes:  No conj pallor.  EOMI  Ears:  Not HOH  Nose:  No congestion.    Mouth:  No oral yeast.  Moist and clearMM Neck:  No mass or JVD Lungs:  Clear B Heart: RRR.   Abdomen:  Soft, NT, ND, obese.   Rectal: not done  Musc/Skeltl: no joint swelling or deformity Extremities:  Non-pitting edema in hands and feet  Neurologic:  Oriented x 3.  Not depressed or anxious Skin:   No AVMs, no rash Tattoos:  none Nodes:  No adenopathy at neck or groin   Psych:  Pleasant, cooperative.  Knows her hx.   Intake/Output from previous day: 05/12 0701 - 05/13 0700 In: 1360 [P.O.:960; I.V.:400] Out: -  Intake/Output this shift:    LAB RESULTS:  Basename 10/03/11 0600 10/02/11 0925 10/02/11 0139 10/01/11 0600  WBC 15.2* -- 11.8* 14.2*  HGB 10.2* 12.2 7.4* --  HCT 30.4* 35.4* 21.4* --  PLT 304 -- 290 259   BMET Lab Results  Component Value Date   NA 138 10/03/2011   NA 135 10/02/2011   NA 132* 09/30/2011   K 4.6 10/03/2011   K 4.6 10/02/2011   K 4.8 09/30/2011   CL 105 10/03/2011   CL 101 10/02/2011   CL 102 09/30/2011   CO2 23 10/03/2011   CO2 20 10/02/2011   CO2 22 09/30/2011   GLUCOSE 112* 10/03/2011   GLUCOSE 160* 10/02/2011   GLUCOSE 190* 09/30/2011   BUN 34* 10/03/2011   BUN 41* 10/02/2011   BUN 48* 09/30/2011   CREATININE 1.42* 10/03/2011   CREATININE 1.64* 10/02/2011   CREATININE 1.60* 09/30/2011   CALCIUM 9.4 10/03/2011   CALCIUM 9.0 10/02/2011   CALCIUM 8.9 09/30/2011   RADIOLOGY STUDIES: Dg Chest Port 1 View 10/02/2011  *RADIOLOGY REPORT*  Clinical Data: Dyspnea and weakness.  PORTABLE CHEST - 1 VIEW  Comparison: 09/28/2011  Findings: Mild cardiac enlargement with normal pulmonary vascularity.  Emphysematous changes with interstitial fibrosis suggested.  No blunting of costophrenic angles.  No focal airspace consolidation.  No pneumothorax.  IMPRESSION: Stable cardiac enlargement.  No evidence of vascular congestion or edema.  No focal airspace  disease.  Original Report Authenticated By: Marlon Pel, M.D.    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 On Rocephin.  ID following  *  Strep gallolyticus positive blood cultures.  Need to rule out colon malignancy. Fever on admission *  Acute renal failure *  Statin-induced myositis, completed steroid taper last year, restarted on lower dose  prednisone recently by neurologist at [facility] for recurrence of muscle weakness.  *  normocytic anemia *  Diarrhea, for last 3 months.  *  Longstanding IDDM *  OSA, CPAP pt.  *  Acute renal failure in setting of staph infection Previous stenting of renal artery stenosis, restented secondary to in-stent restenosis.  On Plavix *  Diastolic dysfunction. Pulmonary htn. 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 Chest pain on admission. Troponin I maxed at 1.01. Myoview negative. Dr Royann Shivers does not feel this is acute coronary syndrome, more likely demand ischemia.    PLAN: *  ? Proceed to diagnostic colonoscopy with pt on Plavix, or delay study to allow for pt to come off Plavix?   LOS: 5 days   [name]  10/03/2011, 1:01 PM Pager: [phone]   Gastroenterology Consult  i have personally evaluated the patient and taken a history and performed exam. Agree with above note. i do hear a soft systolic heart murmur.  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 This is the subtype of S. Bovis most associated with colon neoplasioa. Her stools have been softer for last 3 months. Colonoscopy is appropriate. She is on Plavix for renal artery stents (x 2 - restenosis). Last stent > 1 year ago. It would be best if she were off Plavix 5-7 days prior to colonoscopy due to risks of post-polypectomy bleeding. Have discussed with Dr. Brien Few and will plan for outpatient colonoscopy, off Plavix. This presents increased procedural risk of stent stenosis or obstruction but is appropriate in context. If she needs to be here would do as inpatient. Will hold Plavix now, continue ASA. Will arrange outpatient colonoscopy appointment before she goes home.  Discussed with Dr. Renata Caprice, MD, [facility] [phone] (pager) 10/03/2011 3:04 PM

## 2011-10-03 NOTE — Progress Notes (Signed)
Pt. Seen and examined. Agree with the NP/PA-C note as written. HR is improved with reduced clonidine. Try to wean off with maintaining 0.1 mg patch for 1 week then discontinue. I agree with adding hydralazine and titrating up for bp control. Now on norvasc 10 mg.  Antibiotics per ID for endocarditis.  Will sign-off. Call with questions. We will arrange follow-up with Dr. Allyson Sabal in [place].  Chrystie Nose, MD, [facility] Attending Cardiologist [facility]

## 2011-10-03 NOTE — Progress Notes (Signed)
Portable chest xray showed no vascular congestion and no edema.  Patient given 0.5 alprazolam at HS with good relief of SOB.  C/O slight SOB this am but O2 is 98% on 2 LPM via [place].  She feels like she is getting no rest.  Called [facility] and advised of the above.  Called Respiratory and they will see patient to see if neb needed for patient now.  Will leave note for oncoming provider to see if med for sleep is appropriate.  Will continue to monitor patient.  Stacie Glaze 10/03/2011

## 2011-10-03 NOTE — Progress Notes (Signed)
Subjective: Asymptomatic.  Objective: Vital signs in last 24 hours: Temp:  [97.7 F (36.5 C)-98.1 F (36.7 C)] 97.7 F (36.5 C) (05/13 0525) Pulse Rate:  [45-61] 61  (05/13 0525) Resp:  [18-20] 18  (05/13 0525) BP: (151-170)/(43-53) 161/52 mmHg (05/13 1103) SpO2:  [96 %-99 %] 98 % (05/13 0525) Weight:  [82.464 kg (181 lb 12.8 oz)] 82.464 kg (181 lb 12.8 oz) (05/12 2134) Weight change: 0.764 kg (1 lb 11 oz) Last BM Date: 10/02/11  Intake/Output from previous day: 05/12 0701 - 05/13 0700 In: 1360 [P.O.:960; I.V.:400] Out: -      Physical Exam: General: Comfortable, alert, communicative, fully oriented, not short of breath at rest.  HEENT:  Mild clinical pallor, no jaundice, no conjunctival injection or discharge. Hydration status appears fair. NECK:  Supple, JVP not seen, no carotid bruits, no palpable lymphadenopathy, no palpable goiter. CHEST:  Clinically clear to auscultation, no wheezes, no crackles. HEART:  Sounds 1 and 2 heard, normal, regular, no murmurs. ABDOMEN:  Moderately obese, soft, non-tender, no palpable organomegaly, no palpable masses, normal bowel sounds. GENITALIA:  Not examined. LOWER EXTREMITIES:  No pitting edema, palpable peripheral pulses. MUSCULOSKELETAL SYSTEM:  Unremarkable. CENTRAL NERVOUS SYSTEM:  No focal neurologic deficit on gross examination.  Lab Results:  Basename 10/03/11 0600 10/02/11 0925 10/02/11 0139  WBC 15.2* -- 11.8*  HGB 10.2* 12.2 --  HCT 30.4* 35.4* --  PLT 304 -- 290    Basename 10/03/11 0600 10/02/11 0139  NA 138 135  K 4.6 4.6  CL 105 101  CO2 23 20  GLUCOSE 112* 160*  BUN 34* 41*  CREATININE 1.42* 1.64*  CALCIUM 9.4 9.0   Recent Results (from the past 240 hour(s))  CULTURE, BLOOD (ROUTINE X 2)     Status: Normal   Collection Time   09/28/11  6:48 PM      Component Value Range Status Comment   Specimen Description BLOOD ARM LEFT   Final    Special Requests BOTTLES DRAWN AEROBIC ONLY 10CC   Final    Culture   Setup Time [id number]   Final    Culture     Final    Value: STREPTOCOCCUS SPECIES     Note: SUSCEPTIBILITIES PERFORMED ON PREVIOUS CULTURE WITHIN THE LAST 5 DAYS.     Note: Gram Stain Report Called to,Read Back By and Verified With: LINDA [facility] @ 1806 ON 09/29/11 BY GOLLD   Report Status 10/01/2011 FINAL   Final   CULTURE, BLOOD (ROUTINE X 2)     Status: Normal   Collection Time   09/28/11  7:01 PM      Component Value Range Status Comment   Specimen Description BLOOD ARM RIGHT   Final    Special Requests BOTTLES DRAWN AEROBIC ONLY 10CC   Final    Culture  Setup Time [id number]   Final    Culture     Final    Value: STREPTOCOCCUS SPECIES     Note: Identified as Streptococcus gallolyticus     Note: Gram Stain Report Called to,Read Back By and Verified With: [facility] @ 1806 ON 09/29/11 BY GOLLD   Report Status 10/01/2011 FINAL   Final    Organism ID, Bacteria STREPTOCOCCUS SPECIES   Final   MRSA PCR SCREENING     Status: Normal   Collection Time   09/29/11  5:26 AM      Component Value Range Status Comment   MRSA by PCR NEGATIVE  NEGATIVE  Final  URINE CULTURE     Status: Normal   Collection Time   09/29/11  6:24 AM      Component Value Range Status Comment   Specimen Description URINE, RANDOM   Final    Special Requests NONE   Final    Culture  Setup Time [id number]   Final    Colony Count 20,OOO COLONIES/ML   Final    Culture     Final    Value: Multiple bacterial morphotypes present, none predominant. Suggest appropriate recollection if clinically indicated.   Report Status 09/30/2011 FINAL   Final   CULTURE, BLOOD (ROUTINE X 2)     Status: Normal (Preliminary result)   Collection Time   09/29/11  8:00 PM      Component Value Range Status Comment   Specimen Description BLOOD RIGHT ARM   Final    Special Requests BOTTLES DRAWN AEROBIC ONLY 10CC   Final    Culture  Setup Time [id number]   Final    Culture     Final    Value: STREPTOCOCCUS SPECIES     Note: Gram Stain Report  Called to,Read Back By and Verified With: MEREDITH SMITH @ 1829 ON 09/30/11 BY GOLLD   Report Status PENDING   Incomplete   CULTURE, BLOOD (ROUTINE X 2)     Status: Normal (Preliminary result)   Collection Time   09/29/11  8:03 PM      Component Value Range Status Comment   Specimen Description BLOOD LEFT ARM   Final    Special Requests BOTTLES DRAWN AEROBIC AND ANAEROBIC 10CC   Final    Culture  Setup Time [id number]   Final    Culture     Final    Value: STREPTOCOCCUS SPECIES     Note: Gram Stain Report Called to,Read Back By and Verified With: ELLEN BETHEL 09/30/11 13:40 BT GARRS   Report Status PENDING   Incomplete   CULTURE, BLOOD (ROUTINE X 2)     Status: Normal (Preliminary result)   Collection Time   10/02/11  1:09 AM      Component Value Range Status Comment   Specimen Description BLOOD RIGHT ARM   Final    Special Requests BOTTLES DRAWN AEROBIC AND ANAEROBIC 10CC EACH   Final    Culture  Setup Time [id number]   Final    Culture     Final    Value:        BLOOD CULTURE RECEIVED NO GROWTH TO DATE CULTURE WILL BE HELD FOR 5 DAYS BEFORE ISSUING A FINAL NEGATIVE REPORT   Report Status PENDING   Incomplete   CULTURE, BLOOD (ROUTINE X 2)     Status: Normal (Preliminary result)   Collection Time   10/02/11  1:13 AM      Component Value Range Status Comment   Specimen Description BLOOD RIGHT ARM   Final    Special Requests BOTTLES DRAWN AEROBIC AND ANAEROBIC 10CC EACH   Final    Culture  Setup Time [id number]   Final    Culture     Final    Value:        BLOOD CULTURE RECEIVED NO GROWTH TO DATE CULTURE WILL BE HELD FOR 5 DAYS BEFORE ISSUING A FINAL NEGATIVE REPORT   Report Status PENDING   Incomplete      Studies/Results: Dg Chest Port 1 View  10/02/2011  *RADIOLOGY REPORT*  Clinical Data: Dyspnea and weakness.  PORTABLE CHEST - 1 VIEW  Comparison: 09/28/2011  Findings: Mild cardiac enlargement with normal pulmonary vascularity.  Emphysematous changes with interstitial fibrosis  suggested.  No blunting of costophrenic angles.  No focal airspace consolidation.  No pneumothorax.  IMPRESSION: Stable cardiac enlargement.  No evidence of vascular congestion or edema.  No focal airspace disease.  Original Report Authenticated By: Marlon Pel, M.D.    Medications: Scheduled Meds:    . amLODipine  10 mg Oral Daily  . cefTRIAXone (ROCEPHIN)  IV  2 g Intravenous Q24H  . cloNIDine  0.1 mg Transdermal Weekly  . clopidogrel  75 mg Oral Q breakfast  . escitalopram  20 mg Oral Daily  . hydrALAZINE  25 mg Oral Q8H  . insulin aspart  0-15 Units Subcutaneous TID WC  . insulin aspart  0-5 Units Subcutaneous QHS  . insulin aspart  4 Units Subcutaneous TID WC  . insulin glargine  62 Units Subcutaneous QHS  . isosorbide mononitrate  60 mg Oral Daily  . levothyroxine  112 mcg Oral Q breakfast  . nebivolol  2.5 mg Oral Daily  . omega-3 acid ethyl esters  2 g Oral BID  . pantoprazole  40 mg Oral Daily  . predniSONE  20 mg Oral Q breakfast  . sodium chloride  3 mL Intravenous Q12H   Continuous Infusions:    . sodium chloride 20 mL/hr (10/02/11 2035)   PRN Meds:.acetaminophen, acetaminophen, albuterol, ALPRAZolam, hydrALAZINE, ondansetron (ZOFRAN) IV, ondansetron, oxyCODONE-acetaminophen  Assessment/Plan:  1. Elevated troponin: Patient presented with weakness, dyspnea and elevated cardiac enzymes, against a background of ARF, due to dehydration, from G/enteritis and diuretics. Cardiology consultation was provided by Dr Thurmon Fair. Patient is s/p cardiac catheterization in 2012 with normal coronary arteries and LV function; and apparently has also undergone right and left heart catheterization in the past year at [facility] for evaluation of her pulmonary hypertension. Stress Myoview of 09/30/11, showed no perfusion defects, normal wall motion motion, ejection fraction: 61%. End diastolic volume 93 ml. End-systolic volume 37 ml. Renal function militated against contrast  procedures. Per cardiologist, negative Myoview re-enforces recent coronary angiography, in confirming that positive Troponin is likely not ACS, rather due to pressure/?volume overload of RV. In view of current clinical stability, and subsequent infective developments, no further cardiac work up is contemplated at this time. Patient has had recurrent sinus bradycardia, in the 40s, usually at night-time, and this has responded to reduction of Clonidine dose. Dr Rennis Golden has recommended to wean off with maintaining 0.1 mg patch for 1 week then discontinue. Cardiology team signed off on 10/03/11, and will arrange follow up with Dr Allyson Sabal in [place] office. 2. Nausea with vomiting and Diarrhea: Patient had 3-4 days of vomiting/diarrhea, pre--admission. This has resolved, with no further GI symptoms documented during this hospitalization. C. Diff PCR and enteric precautions have therefore, been discontinued. Likely, this was a self-limited acute viral gastro-enteritis. 3. Volume depletion, gastrointestinal loss /Acute renal failure:  Patient's creatinine was 2.20 on admission, with BUN of 57, consistent with ARF. This was secondary to volume loss, due to gastroenteritis and ongoing Lasix treatment. Diuretics and ARB were discontinued, patient was managed with ivi fluids, with satisfactory response. As of 09/30/11, creatinine was much improved at 1.60. Baseline creatinine was 1.2-1.8 one year prior. IV fluids were discontinued on 09/30/11. Renal indices are now stable. 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 Septic work up was commenced, and Vancomycin started. Blood cultures grew Streptococcus Gallolyticus (formerly Streptococcus Bovis) in 4:4 blood cultures and  2D Echocardiogram of 09/30/11, showed normal left ventricular cavity size, moderate LVH, ejection fraction of 55% to 60% and no regional wall motion abnormalities. Unusual filamentous mobile echogenicities  are seen on the mitral and tricuspid valve, without associated valvular hemodynamically significant abnormalities. Both atria are mildly dilated. PA peak pressure: 63mm Hg (S). Clinically, patient has bacterial endocarditis, based on the above constellation of findings. Dr Cliffton Asters provided ID consultation, and he has recommended high dose Rocephin therapy. It is felt that TEE would not provide any further benefit.  Clinically, patient feels much better, is apyrexial, and wcc is trending down nicely. The plan is to await negative blood cultures, then establish PICC for duration of antibiotics. GI consultation has been requested, and per Dr Erick Blinks, we can anticipate a colonoscopy on 10/04/11. Awaiting Dr Blair Dolphin decision re timing of PICC and duration of antibiotic therapy. 5. Pulmonary HTN-severe- due to diastolic dysfunction  Patient has undergone extensive evaluation at [facility] and based on recent right heart catheterization the etiology for her pulmonary hypertension was diastolic dysfunction. She is on afterload reduction with nitrates and anticoagulation has been discontinued on cardiology recommendations, n view of endocarditis.  6. 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 Patient was recently evaluated by ENT and started on a two-week course of prednisone 20 mg daily, which we have continued. Patient will benefit from PT/OT input. 7. 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  Continue plavix  8. HYPERLIPIDEMIA:   History of statin intolerance. See # 6. Continue omega-3 fatty acids  9. HYPOTHYROIDISM  Continue levothyroxine.  10. HYPERTENSION: Currently on multiple antihypertensives, ie, Amlodipine, Clonidine, Imdur as well as beta blocker. Still sub-optimally controlled, but cardiologist is assisting  with optimization of antihypertensive therapy. Clonidine is being weaned off as described above. Hydralazine was started today. 11. COPD/Obstructive sleep apnea on nocturnal CPAP:  Clinically stable. Placed on nocturnal CPAP. 12. Type 2 Diabetes Mellitus: Patient's insulin-requiring type 2 diabetes, is well controlled with diet, SSI and scheduled Lantus.  Comment: Nearing discharge.   LOS: 5 days   [name],CHRISTOPHER 10/03/2011, 12:34 PM

## 2011-10-04 DIAGNOSIS — N179 Acute kidney failure, unspecified: Secondary | ICD-10-CM

## 2011-10-04 DIAGNOSIS — J13 Pneumonia due to Streptococcus pneumoniae: Secondary | ICD-10-CM

## 2011-10-04 DIAGNOSIS — D7289 Other specified disorders of white blood cells: Secondary | ICD-10-CM

## 2011-10-04 DIAGNOSIS — E1165 Type 2 diabetes mellitus with hyperglycemia: Secondary | ICD-10-CM

## 2011-10-04 LAB — CBC
HCT: 31.4 % — ABNORMAL LOW (ref 36.0–46.0)
Hemoglobin: 10.4 g/dL — ABNORMAL LOW (ref 12.0–15.0)
RBC: 3.48 MIL/uL — ABNORMAL LOW (ref 3.87–5.11)

## 2011-10-04 LAB — CULTURE, BLOOD (ROUTINE X 2): Culture  Setup Time: 201305100215

## 2011-10-04 LAB — BASIC METABOLIC PANEL
CO2: 24 mEq/L (ref 19–32)
Chloride: 106 mEq/L (ref 96–112)
GFR calc non Af Amer: 40 mL/min — ABNORMAL LOW (ref >90)
Glucose, Bld: 73 mg/dL (ref 70–99)
Potassium: 4.7 mEq/L (ref 3.5–5.1)
Sodium: 141 mEq/L (ref 135–145)

## 2011-10-04 LAB — GLUCOSE, CAPILLARY
Glucose-Capillary: 70 mg/dL (ref 70–99)
Glucose-Capillary: 144 mg/dL — ABNORMAL HIGH (ref 70–99)

## 2011-10-04 MED ORDER — HYDRALAZINE HCL 50 MG PO TABS
50.0000 mg | ORAL_TABLET | Freq: Three times a day (TID) | ORAL | Status: DC
  Administered 2011-10-04 – 2011-10-05 (×3): 50 mg via ORAL
  Filled 2011-10-04 (×5): qty 1

## 2011-10-04 NOTE — Progress Notes (Signed)
Have made appt for colonoscopy on 10/11/11 and pre-op GI RN visit for 10/07/11.  Will need to remain off Plavix until after colonoscopy.  These were entered into d/c provider navigator.  Discussed with the patient.  [name] PA_C [phone]

## 2011-10-04 NOTE — Progress Notes (Signed)
INFECTIOUS DISEASE PROGRESS NOTE  ID: [name] is a 66 y.o. 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    Subjective: Feels weak but overall better.  No fever or chills.  Hopefull to go home tomorrow.    Abtx:  Anti-infectives     Start     Dose/Rate Route Frequency Ordered Stop   10/01/11 1800   cefTRIAXone (ROCEPHIN) 2 g in dextrose 5 % 50 mL IVPB        2 g 100 mL/hr over 30 Minutes Intravenous Every 24 hours 10/01/11 1711     09/29/11 2100   vancomycin (VANCOCIN) IVPB 1000 mg/200 mL premix  Status:  Discontinued        1,000 mg 200 mL/hr over 60 Minutes Intravenous Every 24 hours 09/29/11 2026 10/01/11 1711   09/29/11 1915   cefTRIAXone (ROCEPHIN) 1 g in dextrose 5 % 50 mL IVPB  Status:  Discontinued        1 g 100 mL/hr over 30 Minutes Intravenous Every 24 hours 09/29/11 1909 09/29/11 2010          Medications: I have reviewed the patient's current medications.  Objective: Vital signs in last 24 hours: Temp:  [97.6 F (36.4 C)-98.3 F (36.8 C)] 98.1 F (36.7 C) (05/14 0735) Pulse Rate:  [47-63] 54  (05/14 0735) Resp:  [18] 18  (05/14 0735) BP: (156-183)/(36-77) 183/42 mmHg (05/14 0735) SpO2:  [94 %-100 %] 98 % (05/14 0735) Weight:  [182 lb 12.2 oz (82.9 kg)] 182 lb 12.2 oz (82.9 kg) (05/13 2231)   General appearance: alert and no distress Resp: clear to auscultation bilaterally Cardio: regular rate and rhythm, S1, S2 normal, no murmur, click, rub or gallop  Lab Results  Basename 10/04/11 0500 10/03/11 0600  WBC 15.6* 15.2*  HGB 10.4* 10.2*  HCT 31.4* 30.4*  NA 141 138  K 4.7 4.6  CL 106 105  CO2 24 23  BUN 33* 34*  CREATININE 1.37* 1.42*  GLU -- --   Liver Panel No results found for this basename: PROT:2,ALBUMIN:2,AST:2,ALT:2,ALKPHOS:2,BILITOT:2,BILIDIR:2,IBILI:2 in the last 72 hours Sedimentation Rate No results found for this basename: ESRSEDRATE in the last 72 hours C-Reactive Protein No results found for this basename: CRP:2 in the  last 72 hours  Microbiology: Recent Results (from the past 240 hour(s))  CULTURE, BLOOD (ROUTINE X 2)     Status: Normal   Collection Time   09/28/11  6:48 PM      Component Value Range Status Comment   Specimen Description BLOOD ARM LEFT   Final    Special Requests BOTTLES DRAWN AEROBIC ONLY 10CC   Final    Culture  Setup Time [id number]   Final    Culture     Final    Value: STREPTOCOCCUS SPECIES     Note: SUSCEPTIBILITIES PERFORMED ON PREVIOUS CULTURE WITHIN THE LAST 5 DAYS.     Note: Gram Stain Report Called to,Read Back By and Verified With: [facility] @ 1806 ON 09/29/11 BY GOLLD   Report Status 10/01/2011 FINAL   Final   CULTURE, BLOOD (ROUTINE X 2)     Status: Normal   Collection Time   09/28/11  7:01 PM      Component Value Range Status Comment   Specimen Description BLOOD ARM RIGHT   Final    Special Requests BOTTLES DRAWN AEROBIC ONLY 10CC   Final    Culture  Setup Time [id number]   Final    Culture     Final  Value: STREPTOCOCCUS SPECIES     Note: Identified as Streptococcus gallolyticus     Note: Gram Stain Report Called to,Read Back By and Verified With: LINDA [facility] @ 1806 ON 09/29/11 BY GOLLD   Report Status 10/01/2011 FINAL   Final    Organism ID, Bacteria STREPTOCOCCUS SPECIES   Final   MRSA PCR SCREENING     Status: Normal   Collection Time   09/29/11  5:26 AM      Component Value Range Status Comment   MRSA by PCR NEGATIVE  NEGATIVE  Final   URINE CULTURE     Status: Normal   Collection Time   09/29/11  6:24 AM      Component Value Range Status Comment   Specimen Description URINE, RANDOM   Final    Special Requests NONE   Final    Culture  Setup Time [id number]   Final    Colony Count 20,OOO COLONIES/ML   Final    Culture     Final    Value: Multiple bacterial morphotypes present, none predominant. Suggest appropriate recollection if clinically indicated.   Report Status 09/30/2011 FINAL   Final   CULTURE, BLOOD (ROUTINE X 2)     Status: Normal   Collection  Time   09/29/11  8:00 PM      Component Value Range Status Comment   Specimen Description BLOOD RIGHT ARM   Final    Special Requests BOTTLES DRAWN AEROBIC ONLY 10CC   Final    Culture  Setup Time [id number]   Final    Culture     Final    Value: STREPTOCOCCUS GROUP D;high probability for S.bovis     Note: Gram Stain Report Called to,Read Back By and Verified With: MEREDITH SMITH @ 1829 ON 09/30/11 BY GOLLD   Report Status 10/04/2011 FINAL   Final    Organism ID, Bacteria STREPTOCOCCUS GROUP D;high probability for S.bovis   Final   CULTURE, BLOOD (ROUTINE X 2)     Status: Normal   Collection Time   09/29/11  8:03 PM      Component Value Range Status Comment   Specimen Description BLOOD LEFT ARM   Final    Special Requests BOTTLES DRAWN AEROBIC AND ANAEROBIC 10CC   Final    Culture  Setup Time [id number]   Final    Culture     Final    Value: STREPTOCOCCUS GROUP D;high probability for S.bovis     Note: SUSCEPTIBILITIES PERFORMED ON PREVIOUS CULTURE WITHIN THE LAST 5 DAYS.     Note: Gram Stain Report Called to,Read Back By and Verified With: ELLEN BETHEL 09/30/11 13:40 BT GARRS   Report Status 10/04/2011 FINAL   Final   CULTURE, BLOOD (ROUTINE X 2)     Status: Normal (Preliminary result)   Collection Time   10/02/11  1:09 AM      Component Value Range Status Comment   Specimen Description BLOOD RIGHT ARM   Final    Special Requests BOTTLES DRAWN AEROBIC AND ANAEROBIC 10CC EACH   Final    Culture  Setup Time [id number]   Final    Culture     Final    Value:        BLOOD CULTURE RECEIVED NO GROWTH TO DATE CULTURE WILL BE HELD FOR 5 DAYS BEFORE ISSUING A FINAL NEGATIVE REPORT   Report Status PENDING   Incomplete   CULTURE, BLOOD (ROUTINE X 2)     Status: Normal (Preliminary result)  Collection Time   10/02/11  1:13 AM      Component Value Range Status Comment   Specimen Description BLOOD RIGHT ARM   Final    Special Requests BOTTLES DRAWN AEROBIC AND ANAEROBIC [facility]   Final     Culture  Setup Time [id number]   Final    Culture     Final    Value:        BLOOD CULTURE RECEIVED NO GROWTH TO DATE CULTURE WILL BE HELD FOR 5 DAYS BEFORE ISSUING A FINAL NEGATIVE REPORT   Report Status PENDING   Incomplete     Studies/Results: Dg Chest Port 1 View  10/02/2011  *RADIOLOGY REPORT*  Clinical Data: Dyspnea and weakness.  PORTABLE CHEST - 1 VIEW  Comparison: 09/28/2011  Findings: Mild cardiac enlargement with normal pulmonary vascularity.  Emphysematous changes with interstitial fibrosis suggested.  No blunting of costophrenic angles.  No focal airspace consolidation.  No pneumothorax.  IMPRESSION: Stable cardiac enlargement.  No evidence of vascular congestion or edema.  No focal airspace disease.  Original Report Authenticated By: Marlon Pel, M.D.     Assessment/Plan: 1) Native valve endocarditis - repeat blood cultures remain negative.  If negative tomorrow by the am (which is > 72 hours), a new PICC can be placed.    Tareq Dwan Infectious Diseases 10/04/2011, 10:05 AM

## 2011-10-04 NOTE — Progress Notes (Signed)
Agree with Ms. Gribbin's assessment and plan. Raahi Korber E. Taralynn Quiett, MD, FACG  

## 2011-10-04 NOTE — Progress Notes (Signed)
Pt refused cpap at this time, no distress noted, no machine in room.

## 2011-10-04 NOTE — Progress Notes (Signed)
Subjective: No new issues.  Objective: Vital signs in last 24 hours: Temp:  [98 F (36.7 C)-98.3 F (36.8 C)] 98 F (36.7 C) (05/14 1421) Pulse Rate:  [47-56] 54  (05/14 1423) Resp:  [18] 18  (05/14 1421) BP: (156-183)/(36-55) 162/44 mmHg (05/14 1423) SpO2:  [94 %-100 %] 98 % (05/14 1421) Weight:  [82.9 kg (182 lb 12.2 oz)] 82.9 kg (182 lb 12.2 oz) (05/13 2231) Weight change: 0.436 kg (15.4 oz) Last BM Date: 10/02/11  Intake/Output from previous day: 05/13 0701 - 05/14 0700 In: 690 [P.O.:480; I.V.:160; IV Piggyback:50] Out: -  Total I/O In: 360 [P.O.:360] Out: 200 [Urine:200]   Physical Exam: General: Comfortable, alert, communicative, fully oriented, not short of breath at rest.  HEENT:  Mild clinical pallor, no jaundice, no conjunctival injection or discharge. Hydration status appears fair. NECK:  Supple, JVP not seen, no carotid bruits, no palpable lymphadenopathy, no palpable goiter. CHEST:  Clinically clear to auscultation, no wheezes, no crackles. HEART:  Sounds 1 and 2 heard, normal, regular, no murmurs. ABDOMEN:  Moderately obese, soft, non-tender, no palpable organomegaly, no palpable masses, normal bowel sounds. GENITALIA:  Not examined. LOWER EXTREMITIES:  No pitting edema, palpable peripheral pulses. MUSCULOSKELETAL SYSTEM:  Unremarkable. CENTRAL NERVOUS SYSTEM:  No focal neurologic deficit on gross examination.  Lab Results:  Basename 10/04/11 0500 10/03/11 0600  WBC 15.6* 15.2*  HGB 10.4* 10.2*  HCT 31.4* 30.4*  PLT 319 304    Basename 10/04/11 0500 10/03/11 0600  NA 141 138  K 4.7 4.6  CL 106 105  CO2 24 23  GLUCOSE 73 112*  BUN 33* 34*  CREATININE 1.37* 1.42*  CALCIUM 9.8 9.4   Recent Results (from the past 240 hour(s))  CULTURE, BLOOD (ROUTINE X 2)     Status: Normal   Collection Time   09/28/11  6:48 PM      Component Value Range Status Comment   Specimen Description BLOOD ARM LEFT   Final    Special Requests BOTTLES DRAWN AEROBIC ONLY  10CC   Final    Culture  Setup Time [id number]   Final    Culture     Final    Value: STREPTOCOCCUS SPECIES     Note: SUSCEPTIBILITIES PERFORMED ON PREVIOUS CULTURE WITHIN THE LAST 5 DAYS.     Note: Gram Stain Report Called to,Read Back By and Verified With: LINDA [facility] @ 1806 ON 09/29/11 BY GOLLD   Report Status 10/01/2011 FINAL   Final   CULTURE, BLOOD (ROUTINE X 2)     Status: Normal   Collection Time   09/28/11  7:01 PM      Component Value Range Status Comment   Specimen Description BLOOD ARM RIGHT   Final    Special Requests BOTTLES DRAWN AEROBIC ONLY 10CC   Final    Culture  Setup Time [id number]   Final    Culture     Final    Value: STREPTOCOCCUS SPECIES     Note: Identified as Streptococcus gallolyticus     Note: Gram Stain Report Called to,Read Back By and Verified With: [facility] @ 1806 ON 09/29/11 BY GOLLD   Report Status 10/01/2011 FINAL   Final    Organism ID, Bacteria STREPTOCOCCUS SPECIES   Final   MRSA PCR SCREENING     Status: Normal   Collection Time   09/29/11  5:26 AM      Component Value Range Status Comment   MRSA by PCR NEGATIVE  NEGATIVE  Final  URINE CULTURE     Status: Normal   Collection Time   09/29/11  6:24 AM      Component Value Range Status Comment   Specimen Description URINE, RANDOM   Final    Special Requests NONE   Final    Culture  Setup Time [id number]   Final    Colony Count 20,OOO COLONIES/ML   Final    Culture     Final    Value: Multiple bacterial morphotypes present, none predominant. Suggest appropriate recollection if clinically indicated.   Report Status 09/30/2011 FINAL   Final   CULTURE, BLOOD (ROUTINE X 2)     Status: Normal   Collection Time   09/29/11  8:00 PM      Component Value Range Status Comment   Specimen Description BLOOD RIGHT ARM   Final    Special Requests BOTTLES DRAWN AEROBIC ONLY 10CC   Final    Culture  Setup Time [id number]   Final    Culture     Final    Value: STREPTOCOCCUS GROUP D;high probability for  S.bovis     Note: Gram Stain Report Called to,Read Back By and Verified With: MEREDITH SMITH @ 1829 ON 09/30/11 BY GOLLD   Report Status 10/04/2011 FINAL   Final    Organism ID, Bacteria STREPTOCOCCUS GROUP D;high probability for S.bovis   Final   CULTURE, BLOOD (ROUTINE X 2)     Status: Normal   Collection Time   09/29/11  8:03 PM      Component Value Range Status Comment   Specimen Description BLOOD LEFT ARM   Final    Special Requests BOTTLES DRAWN AEROBIC AND ANAEROBIC 10CC   Final    Culture  Setup Time [id number]   Final    Culture     Final    Value: STREPTOCOCCUS GROUP D;high probability for S.bovis     Note: SUSCEPTIBILITIES PERFORMED ON PREVIOUS CULTURE WITHIN THE LAST 5 DAYS.     Note: Gram Stain Report Called to,Read Back By and Verified With: ELLEN BETHEL 09/30/11 13:40 BT [facility]   Report Status 10/04/2011 FINAL   Final   CULTURE, BLOOD (ROUTINE X 2)     Status: Normal (Preliminary result)   Collection Time   10/02/11  1:09 AM      Component Value Range Status Comment   Specimen Description BLOOD RIGHT ARM   Final    Special Requests BOTTLES DRAWN AEROBIC AND ANAEROBIC 10CC EACH   Final    Culture  Setup Time [id number]   Final    Culture     Final    Value:        BLOOD CULTURE RECEIVED NO GROWTH TO DATE CULTURE WILL BE HELD FOR 5 DAYS BEFORE ISSUING A FINAL NEGATIVE REPORT   Report Status PENDING   Incomplete   CULTURE, BLOOD (ROUTINE X 2)     Status: Normal (Preliminary result)   Collection Time   10/02/11  1:13 AM      Component Value Range Status Comment   Specimen Description BLOOD RIGHT ARM   Final    Special Requests BOTTLES DRAWN AEROBIC AND ANAEROBIC 10CC EACH   Final    Culture  Setup Time [id number]   Final    Culture     Final    Value:        BLOOD CULTURE RECEIVED NO GROWTH TO DATE CULTURE WILL BE HELD FOR 5 DAYS BEFORE ISSUING A FINAL NEGATIVE REPORT   Report  Status PENDING   Incomplete      Studies/Results: Dg Chest Port 1 View  10/02/2011   *RADIOLOGY REPORT*  Clinical Data: Dyspnea and weakness.  PORTABLE CHEST - 1 VIEW  Comparison: 09/28/2011  Findings: Mild cardiac enlargement with normal pulmonary vascularity.  Emphysematous changes with interstitial fibrosis suggested.  No blunting of costophrenic angles.  No focal airspace consolidation.  No pneumothorax.  IMPRESSION: Stable cardiac enlargement.  No evidence of vascular congestion or edema.  No focal airspace disease.  Original Report Authenticated By: Marlon Pel, M.D.    Medications: Scheduled Meds:    . amLODipine  10 mg Oral Daily  . aspirin  81 mg Oral Daily  . cefTRIAXone (ROCEPHIN)  IV  2 g Intravenous Q24H  . cloNIDine  0.1 mg Transdermal Weekly  . escitalopram  20 mg Oral Daily  . hydrALAZINE  25 mg Oral Q8H  . insulin aspart  0-15 Units Subcutaneous TID WC  . insulin aspart  0-5 Units Subcutaneous QHS  . insulin aspart  4 Units Subcutaneous TID WC  . insulin glargine  62 Units Subcutaneous QHS  . isosorbide mononitrate  60 mg Oral Daily  . levothyroxine  112 mcg Oral Q breakfast  . nebivolol  2.5 mg Oral Daily  . omega-3 acid ethyl esters  2 g Oral BID  . pantoprazole  40 mg Oral Daily  . predniSONE  20 mg Oral Q breakfast  . sodium chloride  3 mL Intravenous Q12H   Continuous Infusions:    . sodium chloride 20 mL/hr (10/02/11 2035)   PRN Meds:.acetaminophen, acetaminophen, albuterol, ALPRAZolam, ALPRAZolam, hydrALAZINE, ondansetron (ZOFRAN) IV, ondansetron, oxyCODONE-acetaminophen  Assessment/Plan:  1. Elevated troponin: Patient presented with weakness, dyspnea and elevated cardiac enzymes, against a background of ARF, due to dehydration, from G/enteritis and diuretics. Cardiology consultation was provided by Dr Thurmon Fair. Patient is s/p cardiac catheterization in 2012 with normal coronary arteries and LV function; and apparently has also undergone right and left heart catheterization in the past year at [facility] for evaluation of her pulmonary  hypertension. Stress Myoview of 09/30/11, showed no perfusion defects, normal wall motion motion, ejection fraction: 61%. End diastolic volume 93 ml. End-systolic volume 37 ml. Renal function militated against contrast procedures. Per cardiologist, negative Myoview re-enforces recent coronary angiography, in confirming that positive Troponin is likely not ACS, rather due to pressure/?volume overload of RV. In view of current clinical stability, and subsequent infective developments, no further cardiac work up is contemplated at this time. Patient has had recurrent sinus bradycardia, in the 40s, usually at night-time, and this has responded to reduction of Clonidine dose. Dr Rennis Golden has recommended to wean off with maintaining 0.1 mg patch for 1 week then discontinue. Cardiology team signed off on 10/03/11, and will arrange follow up with Dr Allyson Sabal in [place] office. 2. Nausea with vomiting and Diarrhea: Patient had 3-4 days of vomiting/diarrhea, pre--admission. This has resolved, with no further GI symptoms documented during this hospitalization. C. Diff PCR and enteric precautions have therefore, been discontinued. Likely, this was a self-limited acute viral gastro-enteritis. 3. Volume depletion, gastrointestinal loss /Acute renal failure:  Patient's creatinine was 2.20 on admission, with BUN of 57, consistent with ARF. This was secondary to volume loss, due to gastroenteritis and ongoing Lasix treatment. Diuretics and ARB were discontinued, patient was managed with ivi fluids, with satisfactory response. As of 09/30/11, creatinine was much improved at 1.60. Baseline creatinine was 1.2-1.8 one year prior. IV fluids were discontinued on 09/30/11. Renal indices are  now stable. Creatinine was 1.37 on 10/04/11. 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 Septic work up was commenced, and Vancomycin started. Blood cultures grew Streptococcus Gallolyticus (formerly  Streptococcus Bovis) in 4:4 blood cultures and 2D Echocardiogram of 09/30/11, showed normal left ventricular cavity size, moderate LVH, ejection fraction of 55% to 60% and no regional wall motion abnormalities. Unusual filamentous mobile echogenicities are seen on the mitral and tricuspid valve, without associated valvular hemodynamically significant abnormalities. Both atria are mildly dilated. PA peak pressure: 63mm Hg (S). Clinically, patient has bacterial endocarditis, based on the above constellation of findings. Dr Cliffton Asters provided ID consultation, and he has recommended high dose Rocephin therapy. It is felt that TEE would not provide any further benefit.  Clinically, patient feels much better, is apyrexial, and wcc is trending down nicely. The plan is to await negative blood cultures x 72 hours (this will be achieved on 10/05/11), then establish PICC for duration of antibiotics. GI consultation was provided by Dr Stan Head, but colonoscopy had to be cancelled, as patient is still  On Plavix. This will be arranged by Dr Marvell Fuller office, on discharge. Awaiting Dr Ephriam Knuckles decision re duration of antibiotic therapy. 5. 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 She is on afterload reduction with nitrates and anticoagulation has been discontinued on cardiology recommendations, in view of endocarditis.  6. 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 Patient was recently evaluated by ENT and started on a two-week course of prednisone 20 mg daily, which we have continued. Patient will benefit from PT/OT input. 7. 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  Continue plavix  8. HYPERLIPIDEMIA:   History of statin intolerance. See # 6. Continue omega-3 fatty acids  9. HYPOTHYROIDISM: Continued on Levothyroxine.  10. HYPERTENSION: Currently on multiple antihypertensives, ie, Amlodipine, Clonidine, Imdur as well as beta blocker. Still sub-optimally controlled, but cardiologist is assisting with optimization of antihypertensive therapy. Clonidine is being weaned off as described above. Hydralazine was started on 10/03/11. 11. COPD/Obstructive sleep apnea on nocturnal CPAP:  Clinically stable. Placed on nocturnal CPAP. 12. Type 2 Diabetes Mellitus: Patient's insulin-requiring type 2 diabetes, is well controlled with diet, SSI and scheduled Lantus.  Comment: Nearing discharge.   LOS: 6 days   Latressa Harries,CHRISTOPHER 10/04/2011, 3:38 PM

## 2011-10-05 DIAGNOSIS — J13 Pneumonia due to Streptococcus pneumoniae: Secondary | ICD-10-CM

## 2011-10-05 DIAGNOSIS — N179 Acute kidney failure, unspecified: Secondary | ICD-10-CM

## 2011-10-05 DIAGNOSIS — D7289 Other specified disorders of white blood cells: Secondary | ICD-10-CM

## 2011-10-05 DIAGNOSIS — E1165 Type 2 diabetes mellitus with hyperglycemia: Secondary | ICD-10-CM

## 2011-10-05 LAB — GLUCOSE, CAPILLARY
Glucose-Capillary: 58 mg/dL — ABNORMAL LOW (ref 70–99)
Glucose-Capillary: 171 mg/dL — ABNORMAL HIGH (ref 70–99)
Glucose-Capillary: 162 mg/dL — ABNORMAL HIGH (ref 70–99)

## 2011-10-05 LAB — URINALYSIS, ROUTINE W REFLEX MICROSCOPIC
Glucose, UA: NEGATIVE mg/dL
Leukocytes, UA: NEGATIVE
pH: 6 (ref 5.0–8.0)

## 2011-10-05 LAB — BASIC METABOLIC PANEL
BUN: 34 mg/dL — ABNORMAL HIGH (ref 6–23)
GFR calc Af Amer: 46 mL/min — ABNORMAL LOW (ref >90)
GFR calc non Af Amer: 40 mL/min — ABNORMAL LOW (ref >90)
Potassium: 4 mEq/L (ref 3.5–5.1)

## 2011-10-05 LAB — URINE MICROSCOPIC-ADD ON

## 2011-10-05 LAB — CBC
HCT: 35.1 % — ABNORMAL LOW (ref 36.0–46.0)
MCHC: 33.3 g/dL (ref 30.0–36.0)
Platelets: 433 10*3/uL — ABNORMAL HIGH (ref 150–400)
RDW: 13.6 % (ref 11.5–15.5)

## 2011-10-05 MED ORDER — HYDRALAZINE HCL 50 MG PO TABS
50.0000 mg | ORAL_TABLET | Freq: Four times a day (QID) | ORAL | Status: DC
  Administered 2011-10-05 – 2011-10-06 (×5): 50 mg via ORAL
  Filled 2011-10-05 (×7): qty 1

## 2011-10-05 NOTE — Progress Notes (Signed)
INFECTIOUS DISEASE PROGRESS NOTE  ID: [name] is a 66 y.o. 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    Subjective: Feels weak but overall better.  No fever or chills.  Hopefull to go home tomorrow.    Abtx:  Anti-infectives     Start     Dose/Rate Route Frequency Ordered Stop   10/01/11 1800   cefTRIAXone (ROCEPHIN) 2 g in dextrose 5 % 50 mL IVPB        2 g 100 mL/hr over 30 Minutes Intravenous Every 24 hours 10/01/11 1711     09/29/11 2100   vancomycin (VANCOCIN) IVPB 1000 mg/200 mL premix  Status:  Discontinued        1,000 mg 200 mL/hr over 60 Minutes Intravenous Every 24 hours 09/29/11 2026 10/01/11 1711   09/29/11 1915   cefTRIAXone (ROCEPHIN) 1 g in dextrose 5 % 50 mL IVPB  Status:  Discontinued        1 g 100 mL/hr over 30 Minutes Intravenous Every 24 hours 09/29/11 1909 09/29/11 2010          Medications: I have reviewed the patient's current medications.  Objective: Vital signs in last 24 hours: Temp:  [98.3 F (36.8 C)-98.8 F (37.1 C)] 98.8 F (37.1 C) (05/15 1315) Pulse Rate:  [52-75] 66  (05/15 1315) Resp:  [18] 18  (05/15 1315) BP: (164-187)/(45-55) 187/55 mmHg (05/15 1315) SpO2:  [90 %-95 %] 95 % (05/15 1315) Weight:  [179 lb 7.3 oz (81.4 kg)] 179 lb 7.3 oz (81.4 kg) (05/14 2015)   General appearance: alert and no distress Resp: clear to auscultation bilaterally Cardio: regular rate and rhythm, S1, S2 normal, no murmur, click, rub or gallop  Lab Results  Basename 10/05/11 0550 10/04/11 0500  WBC 19.3* 15.6*  HGB 11.7* 10.4*  HCT 35.1* 31.4*  NA 141 141  K 4.0 4.7  CL 105 106  CO2 24 24  BUN 34* 33*  CREATININE 1.38* 1.37*  GLU -- --   Liver Panel No results found for this basename: PROT:2,ALBUMIN:2,AST:2,ALT:2,ALKPHOS:2,BILITOT:2,BILIDIR:2,IBILI:2 in the last 72 hours Sedimentation Rate No results found for this basename: ESRSEDRATE in the last 72 hours C-Reactive Protein No results found for this basename: CRP:2 in the last  72 hours  Microbiology: Recent Results (from the past 240 hour(s))  CULTURE, BLOOD (ROUTINE X 2)     Status: Normal   Collection Time   09/28/11  6:48 PM      Component Value Range Status Comment   Specimen Description BLOOD ARM LEFT   Final    Special Requests BOTTLES DRAWN AEROBIC ONLY 10CC   Final    Culture  Setup Time [id number]   Final    Culture     Final    Value: STREPTOCOCCUS SPECIES     Note: SUSCEPTIBILITIES PERFORMED ON PREVIOUS CULTURE WITHIN THE LAST 5 DAYS.     Note: Gram Stain Report Called to,Read Back By and Verified With: [facility] @ 1806 ON 09/29/11 BY GOLLD   Report Status 10/01/2011 FINAL   Final   CULTURE, BLOOD (ROUTINE X 2)     Status: Normal   Collection Time   09/28/11  7:01 PM      Component Value Range Status Comment   Specimen Description BLOOD ARM RIGHT   Final    Special Requests BOTTLES DRAWN AEROBIC ONLY 10CC   Final    Culture  Setup Time [id number]   Final    Culture     Final  Value: STREPTOCOCCUS SPECIES     Note: Identified as Streptococcus gallolyticus     Note: Gram Stain Report Called to,Read Back By and Verified With: LINDA [facility] @ 1806 ON 09/29/11 BY GOLLD   Report Status 10/01/2011 FINAL   Final    Organism ID, Bacteria STREPTOCOCCUS SPECIES   Final   MRSA PCR SCREENING     Status: Normal   Collection Time   09/29/11  5:26 AM      Component Value Range Status Comment   MRSA by PCR NEGATIVE  NEGATIVE  Final   URINE CULTURE     Status: Normal   Collection Time   09/29/11  6:24 AM      Component Value Range Status Comment   Specimen Description URINE, RANDOM   Final    Special Requests NONE   Final    Culture  Setup Time [id number]   Final    Colony Count 20,OOO COLONIES/ML   Final    Culture     Final    Value: Multiple bacterial morphotypes present, none predominant. Suggest appropriate recollection if clinically indicated.   Report Status 09/30/2011 FINAL   Final   CULTURE, BLOOD (ROUTINE X 2)     Status: Normal   Collection Time    09/29/11  8:00 PM      Component Value Range Status Comment   Specimen Description BLOOD RIGHT ARM   Final    Special Requests BOTTLES DRAWN AEROBIC ONLY 10CC   Final    Culture  Setup Time [id number]   Final    Culture     Final    Value: STREPTOCOCCUS GROUP D;high probability for S.bovis     Note: Gram Stain Report Called to,Read Back By and Verified With: MEREDITH SMITH @ 1829 ON 09/30/11 BY GOLLD   Report Status 10/04/2011 FINAL   Final    Organism ID, Bacteria STREPTOCOCCUS GROUP D;high probability for S.bovis   Final   CULTURE, BLOOD (ROUTINE X 2)     Status: Normal   Collection Time   09/29/11  8:03 PM      Component Value Range Status Comment   Specimen Description BLOOD LEFT ARM   Final    Special Requests BOTTLES DRAWN AEROBIC AND ANAEROBIC 10CC   Final    Culture  Setup Time [id number]   Final    Culture     Final    Value: STREPTOCOCCUS GROUP D;high probability for S.bovis     Note: SUSCEPTIBILITIES PERFORMED ON PREVIOUS CULTURE WITHIN THE LAST 5 DAYS.     Note: Gram Stain Report Called to,Read Back By and Verified With: ELLEN BETHEL 09/30/11 13:40 BT [facility]   Report Status 10/04/2011 FINAL   Final   CULTURE, BLOOD (ROUTINE X 2)     Status: Normal (Preliminary result)   Collection Time   10/02/11  1:09 AM      Component Value Range Status Comment   Specimen Description BLOOD RIGHT ARM   Final    Special Requests BOTTLES DRAWN AEROBIC AND ANAEROBIC 10CC EACH   Final    Culture  Setup Time [id number]   Final    Culture     Final    Value:        BLOOD CULTURE RECEIVED NO GROWTH TO DATE CULTURE WILL BE HELD FOR 5 DAYS BEFORE ISSUING A FINAL NEGATIVE REPORT   Report Status PENDING   Incomplete   CULTURE, BLOOD (ROUTINE X 2)     Status: Normal (Preliminary result)  Collection Time   10/02/11  1:13 AM      Component Value Range Status Comment   Specimen Description BLOOD RIGHT ARM   Final    Special Requests BOTTLES DRAWN AEROBIC AND ANAEROBIC 10CC EACH   Final    Culture   Setup Time [id number]   Final    Culture     Final    Value:        BLOOD CULTURE RECEIVED NO GROWTH TO DATE CULTURE WILL BE HELD FOR 5 DAYS BEFORE ISSUING A FINAL NEGATIVE REPORT   Report Status PENDING   Incomplete     Studies/Results: No results found.   Assessment/Plan: 1) Native valve endocarditis - repeat blood cultures remain negative. PICC line ordered.  4 weeks total through 6/10. I will arrange follow up with her in our clinic on June 10th or 11th to consider taking out the PICC (ok to overbook).    Thanks, call with questions  Lekendrick Alpern Infectious Diseases 10/05/2011, 3:53 PM

## 2011-10-05 NOTE — Progress Notes (Addendum)
Subjective: Pt denies chest pain, headaches, nausea vomiting also denies shortness of breath.  Objective: Vital signs in last 24 hours: Temp:  [98.3 F (36.8 C)-98.7 F (37.1 C)] 98.3 F (36.8 C) (05/15 0848) Pulse Rate:  [52-75] 56  (05/15 0900) Resp:  [18] 18  (05/15 0848) BP: (164-182)/(45-53) 182/52 mmHg (05/15 0900) SpO2:  [90 %-95 %] 94 % (05/15 0848) Weight:  [81.4 kg (179 lb 7.3 oz)] 81.4 kg (179 lb 7.3 oz) (05/14 2015) Weight change: -1.5 kg (-3 lb 4.9 oz) Last BM Date: 10/02/11  Intake/Output from previous day: 05/14 0701 - 05/15 0700 In: 680 [P.O.:480; I.V.:200] Out: 200 [Urine:200] Total I/O In: 240 [P.O.:240] Out: 100 [Urine:100]   Physical Exam: General: Comfortable, alert, communicative, fully oriented, not short of breath at rest.  HEENT:  Mild clinical pallor, no jaundice, no conjunctival injection or discharge. Hydration status appears fair. NECK:  Supple, JVP not seen, no carotid bruits, no palpable lymphadenopathy, no palpable goiter. CHEST:  Clinically clear to auscultation, no wheezes, no crackles. HEART:  Sounds 1 and 2 heard, normal, regular, no murmurs. ABDOMEN:  Moderately obese, soft, non-tender, no palpable organomegaly, no palpable masses, normal bowel sounds. GENITALIA:  Not examined. LOWER EXTREMITIES:  No pitting edema, palpable peripheral pulses. MUSCULOSKELETAL SYSTEM:  Unremarkable. CENTRAL NERVOUS SYSTEM:  No focal neurologic deficit on gross examination.  Lab Results:  Basename 10/05/11 0550 10/04/11 0500  WBC 19.3* 15.6*  HGB 11.7* 10.4*  HCT 35.1* 31.4*  PLT 433* 319    Basename 10/05/11 0550 10/04/11 0500  NA 141 141  K 4.0 4.7  CL 105 106  CO2 24 24  GLUCOSE 55* 73  BUN 34* 33*  CREATININE 1.38* 1.37*  CALCIUM 10.1 9.8   Recent Results (from the past 240 hour(s))  CULTURE, BLOOD (ROUTINE X 2)     Status: Normal   Collection Time   09/28/11  6:48 PM      Component Value Range Status Comment   Specimen Description  BLOOD ARM LEFT   Final    Special Requests BOTTLES DRAWN AEROBIC ONLY 10CC   Final    Culture  Setup Time [id number]   Final    Culture     Final    Value: STREPTOCOCCUS SPECIES     Note: SUSCEPTIBILITIES PERFORMED ON PREVIOUS CULTURE WITHIN THE LAST 5 DAYS.     Note: Gram Stain Report Called to,Read Back By and Verified With: LINDA [facility] @ 1806 ON 09/29/11 BY GOLLD   Report Status 10/01/2011 FINAL   Final   CULTURE, BLOOD (ROUTINE X 2)     Status: Normal   Collection Time   09/28/11  7:01 PM      Component Value Range Status Comment   Specimen Description BLOOD ARM RIGHT   Final    Special Requests BOTTLES DRAWN AEROBIC ONLY 10CC   Final    Culture  Setup Time [id number]   Final    Culture     Final    Value: STREPTOCOCCUS SPECIES     Note: Identified as Streptococcus gallolyticus     Note: Gram Stain Report Called to,Read Back By and Verified With: [facility] @ 1806 ON 09/29/11 BY GOLLD   Report Status 10/01/2011 FINAL   Final    Organism ID, Bacteria STREPTOCOCCUS SPECIES   Final   MRSA PCR SCREENING     Status: Normal   Collection Time   09/29/11  5:26 AM      Component Value Range Status Comment  MRSA by PCR NEGATIVE  NEGATIVE  Final   URINE CULTURE     Status: Normal   Collection Time   09/29/11  6:24 AM      Component Value Range Status Comment   Specimen Description URINE, RANDOM   Final    Special Requests NONE   Final    Culture  Setup Time [id number]   Final    Colony Count 20,OOO COLONIES/ML   Final    Culture     Final    Value: Multiple bacterial morphotypes present, none predominant. Suggest appropriate recollection if clinically indicated.   Report Status 09/30/2011 FINAL   Final   CULTURE, BLOOD (ROUTINE X 2)     Status: Normal   Collection Time   09/29/11  8:00 PM      Component Value Range Status Comment   Specimen Description BLOOD RIGHT ARM   Final    Special Requests BOTTLES DRAWN AEROBIC ONLY 10CC   Final    Culture  Setup Time [id number]   Final     Culture     Final    Value: STREPTOCOCCUS GROUP D;high probability for S.bovis     Note: Gram Stain Report Called to,Read Back By and Verified With: MEREDITH SMITH @ 1829 ON 09/30/11 BY GOLLD   Report Status 10/04/2011 FINAL   Final    Organism ID, Bacteria STREPTOCOCCUS GROUP D;high probability for S.bovis   Final   CULTURE, BLOOD (ROUTINE X 2)     Status: Normal   Collection Time   09/29/11  8:03 PM      Component Value Range Status Comment   Specimen Description BLOOD LEFT ARM   Final    Special Requests BOTTLES DRAWN AEROBIC AND ANAEROBIC 10CC   Final    Culture  Setup Time [id number]   Final    Culture     Final    Value: STREPTOCOCCUS GROUP D;high probability for S.bovis     Note: SUSCEPTIBILITIES PERFORMED ON PREVIOUS CULTURE WITHIN THE LAST 5 DAYS.     Note: Gram Stain Report Called to,Read Back By and Verified With: ELLEN BETHEL 09/30/11 13:40 BT [facility]   Report Status 10/04/2011 FINAL   Final   CULTURE, BLOOD (ROUTINE X 2)     Status: Normal (Preliminary result)   Collection Time   10/02/11  1:09 AM      Component Value Range Status Comment   Specimen Description BLOOD RIGHT ARM   Final    Special Requests BOTTLES DRAWN AEROBIC AND ANAEROBIC 10CC EACH   Final    Culture  Setup Time [id number]   Final    Culture     Final    Value:        BLOOD CULTURE RECEIVED NO GROWTH TO DATE CULTURE WILL BE HELD FOR 5 DAYS BEFORE ISSUING A FINAL NEGATIVE REPORT   Report Status PENDING   Incomplete   CULTURE, BLOOD (ROUTINE X 2)     Status: Normal (Preliminary result)   Collection Time   10/02/11  1:13 AM      Component Value Range Status Comment   Specimen Description BLOOD RIGHT ARM   Final    Special Requests BOTTLES DRAWN AEROBIC AND ANAEROBIC [facility]   Final    Culture  Setup Time [id number]   Final    Culture     Final    Value:        BLOOD CULTURE RECEIVED NO GROWTH TO DATE CULTURE WILL BE HELD FOR 5  DAYS BEFORE ISSUING A FINAL NEGATIVE REPORT   Report Status PENDING    Incomplete      Studies/Results: No results found.  Medications: Scheduled Meds:    . amLODipine  10 mg Oral Daily  . aspirin  81 mg Oral Daily  . cefTRIAXone (ROCEPHIN)  IV  2 g Intravenous Q24H  . cloNIDine  0.1 mg Transdermal Weekly  . escitalopram  20 mg Oral Daily  . hydrALAZINE  50 mg Oral Q8H  . insulin aspart  0-15 Units Subcutaneous TID WC  . insulin aspart  0-5 Units Subcutaneous QHS  . insulin aspart  4 Units Subcutaneous TID WC  . insulin glargine  62 Units Subcutaneous QHS  . isosorbide mononitrate  60 mg Oral Daily  . levothyroxine  112 mcg Oral Q breakfast  . nebivolol  2.5 mg Oral Daily  . omega-3 acid ethyl esters  2 g Oral BID  . pantoprazole  40 mg Oral Daily  . predniSONE  20 mg Oral Q breakfast  . sodium chloride  3 mL Intravenous Q12H  . DISCONTD: hydrALAZINE  25 mg Oral Q8H   Continuous Infusions:    . sodium chloride 20 mL/hr at 10/04/11 1901   PRN Meds:.acetaminophen, acetaminophen, albuterol, ALPRAZolam, ALPRAZolam, hydrALAZINE, ondansetron (ZOFRAN) IV, ondansetron, oxyCODONE-acetaminophen  Assessment/Plan:  1. Elevated troponin: Patient presented with weakness, dyspnea and elevated cardiac enzymes, against a background of ARF, due to dehydration, from G/enteritis and diuretics. Cardiology consultation was provided by Dr Thurmon Fair. Patient is s/p cardiac catheterization in 2012 with normal coronary arteries and LV function; and apparently has also undergone right and left heart catheterization in the past year at [facility] for evaluation of her pulmonary hypertension. Stress Myoview of 09/30/11, showed no perfusion defects, normal wall motion motion, ejection fraction: 61%. End diastolic volume 93 ml. End-systolic volume 37 ml. Renal function militated against contrast procedures. Per cardiologist, negative Myoview re-enforces recent coronary angiography, in confirming that positive Troponin is likely not ACS, rather due to pressure/?volume overload of  RV. In view of current clinical stability, and subsequent infective developments, no further cardiac work up is contemplated at this time. Patient has had recurrent sinus bradycardia, in the 40s, usually at night-time, and this has responded to reduction of Clonidine dose. Dr Rennis Golden has recommended to wean off with maintaining 0.1 mg patch for 1 week then discontinue. Cardiology team signed off on 10/03/11, and will arrange follow up with Dr Allyson Sabal in [place] office. 2. Nausea with vomiting and Diarrhea: Patient had 3-4 days of vomiting/diarrhea, pre--admission. This has resolved, with no further GI symptoms documented during this hospitalization. C. Diff PCR and enteric precautions have therefore, been discontinued. Likely, this was a self-limited acute viral gastro-enteritis. 3. Volume depletion, gastrointestinal loss /Acute renal failure:  Patient's creatinine was 2.20 on admission, with BUN of 57, consistent with ARF. This was secondary to volume loss, due to gastroenteritis and ongoing Lasix treatment. Diuretics and ARB were discontinued, patient was managed with ivi fluids, with satisfactory response. As of 09/30/11, creatinine was much improved at 1.60. Baseline creatinine was 1.2-1.8 one year prior. IV fluids were discontinued on 09/30/11. Renal indices are now stable. Creatinine was 1.37 on 10/04/11. -Creatinine remained stable at 1.38 today 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 Septic work up was commenced, and Vancomycin started. Blood cultures grew Streptococcus Gallolyticus (formerly Streptococcus Bovis) in 4:4 blood cultures and 2D Echocardiogram of 09/30/11, showed normal left ventricular cavity size, moderate LVH, ejection  fraction of 55% to 60% and no regional wall motion abnormalities. Unusual filamentous mobile echogenicities are seen on the mitral and tricuspid valve, without associated valvular hemodynamically significant  abnormalities. Both atria are mildly dilated. PA peak pressure: 63mm Hg (S). Clinically, patient has bacterial endocarditis, based on the above constellation of findings. Dr Cliffton Asters provided ID consultation, and he has recommended high dose Rocephin therapy. It is felt that TEE would not provide any further benefit.  Clinically, patient feels much better, is apyrexial, and wcc is trending down nicely. The plan is to await negative blood cultures x 72 hours (this will be achieved on 10/05/11), then establish PICC for duration of antibiotics. GI consultation was provided by Dr Stan Head, but colonoscopy had to be cancelled, as patient is still  On Plavix. This will be arranged by Dr Marvell Fuller office, on discharge. Awaiting Dr Ephriam Knuckles decision re duration of antibiotic therapy. -The patient's blood cultures showing no growth today at 72 hours and patient remaining afebrile -Will place a PICC line, await ID/Dr Comer's final recommendations for duration of antibiotic therapy. -increasing wbc possibly due to steroids 5. Pulmonary HTN-severe- due to diastolic dysfunction  Patient has undergone extensive evaluation at [facility] and based on recent right heart catheterization the etiology for her pulmonary hypertension was diastolic dysfunction. She is on afterload reduction with nitrates and anticoagulation has been discontinued on cardiology recommendations, in view of endocarditis.  6. 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 Patient was recently evaluated by ENT and started on a two-week course of prednisone 20 mg daily, which we have continued. Patient will benefit from PT/OT input. 7. 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  Continue plavix  8. HYPERLIPIDEMIA:   History of statin intolerance. See # 6. Continue  omega-3 fatty acids  9. HYPOTHYROIDISM: Continued on Levothyroxine.  10. HYPERTENSION,uncontrolled: Currently on multiple antihypertensives, ie, Amlodipine, Clonidine, Imdur as well as beta blocker. Still sub-optimally controlled, but cardiologist is assisting with optimization of antihypertensive therapy. Clonidine is being weaned off as described above. Hydralazine was started on 10/03/11. -Will increase hydralazine to 4 times a day, continue monitoring for improved control and further manage as appropriate. 11. COPD/Obstructive sleep apnea on nocturnal CPAP:  Clinically stable. Placed on nocturnal CPAP. 12. Type 2 Diabetes Mellitus: Patient's insulin-requiring type 2 diabetes, is well controlled with diet, SSI and scheduled Lantus.  Comment: Once PICC line placed we'll plan discharge home possibly in a.m. if okay with infectious disease..   LOS: 7 days   Nashaly Dorantes C 10/05/2011, 2:25 PM

## 2011-10-05 NOTE — Progress Notes (Signed)
Inpatient Diabetes Program Recommendations  AACE/ADA: New Consensus Statement on Inpatient Glycemic Control (2009)  Target Ranges:  Prepandial:   less than 140 mg/dL      Peak postprandial:   less than 180 mg/dL (1-2 hours)      Critically ill patients:  140 - 180 mg/dL    Results for [name], [name] (MRN [id number]) as of 10/05/2011 13:11  Ref. Range 10/05/2011 07:39  Glucose-Capillary Latest Range: 70-99 mg/dL 58 (L)     Inpatient Diabetes Program Recommendations Insulin - Basal: Hypoglycemic this morning (CBG 58 mg/dl)- If this trend continues, may want to reduce Lantus to 58 units QHS. Correction (SSI): Currently on Moderate SSI tid ac + HS.  Note: will follow. Ambrose Finland RN, MSN, CDE Diabetes Coordinator Inpatient Diabetes Program [phone]

## 2011-10-05 NOTE — Progress Notes (Signed)
Pt BP remaining >160 sys. Gave PRN hydro.

## 2011-10-06 DIAGNOSIS — N179 Acute kidney failure, unspecified: Secondary | ICD-10-CM

## 2011-10-06 DIAGNOSIS — D7289 Other specified disorders of white blood cells: Secondary | ICD-10-CM

## 2011-10-06 DIAGNOSIS — J13 Pneumonia due to Streptococcus pneumoniae: Secondary | ICD-10-CM

## 2011-10-06 DIAGNOSIS — E1165 Type 2 diabetes mellitus with hyperglycemia: Secondary | ICD-10-CM

## 2011-10-06 LAB — CBC
MCH: 30.6 pg (ref 26.0–34.0)
MCV: 90.4 fL (ref 78.0–100.0)
Platelets: 402 10*3/uL — ABNORMAL HIGH (ref 150–400)
RDW: 13.5 % (ref 11.5–15.5)

## 2011-10-06 LAB — GLUCOSE, CAPILLARY
Glucose-Capillary: 96 mg/dL (ref 70–99)
Glucose-Capillary: 142 mg/dL — ABNORMAL HIGH (ref 70–99)

## 2011-10-06 MED ORDER — CLONIDINE HCL 0.1 MG/24HR TD PTWK: 1.0000 | MEDICATED_PATCH | TRANSDERMAL | Status: DC

## 2011-10-06 MED ORDER — FUROSEMIDE 40 MG PO TABS: 40.0000 mg | ORAL_TABLET | Freq: Two times a day (BID) | ORAL | Status: DC

## 2011-10-06 MED ORDER — OMEPRAZOLE 40 MG PO CPDR: 40.0000 mg | DELAYED_RELEASE_CAPSULE | Freq: Every day | ORAL | Status: DC

## 2011-10-06 MED ORDER — SODIUM CHLORIDE 0.9 % IJ SOLN
10.0000 mL | INTRAMUSCULAR | Status: DC | PRN
  Administered 2011-10-06: 10 mL

## 2011-10-06 MED ORDER — PREDNISONE 20 MG PO TABS: 20.0000 mg | ORAL_TABLET | Freq: Every day | ORAL | Status: AC

## 2011-10-06 MED ORDER — DEXTROSE 5 % IV SOLN: 2.0000 g | INTRAVENOUS | Status: DC

## 2011-10-06 MED ORDER — FUROSEMIDE 40 MG PO TABS
40.0000 mg | ORAL_TABLET | Freq: Two times a day (BID) | ORAL | Status: DC
  Administered 2011-10-06 (×2): 40 mg via ORAL
  Filled 2011-10-06 (×4): qty 1

## 2011-10-06 MED ORDER — HYDRALAZINE HCL 50 MG PO TABS: 50.0000 mg | ORAL_TABLET | Freq: Four times a day (QID) | ORAL | Status: DC

## 2011-10-06 MED ORDER — AMLODIPINE BESYLATE 10 MG PO TABS: 10.0000 mg | ORAL_TABLET | Freq: Every day | ORAL | Status: DC

## 2011-10-06 MED ORDER — HEPARIN SOD (PORK) LOCK FLUSH 100 UNIT/ML IV SOLN
250.0000 [IU] | INTRAVENOUS | Status: DC | PRN
  Administered 2011-10-06: 250 [IU]
  Filled 2011-10-06: qty 3

## 2011-10-06 MED ORDER — HEPARIN SOD (PORK) LOCK FLUSH 100 UNIT/ML IV SOLN
250.0000 [IU] | Freq: Every day | INTRAVENOUS | Status: DC
  Filled 2011-10-06 (×2): qty 3

## 2011-10-06 NOTE — Discharge Summary (Signed)
Discharge Note  Name: [name] MRN: [id number] DOB: 1945-09-01 66 y.o.  Date of Admission: 09/28/2011  3:47 PM Date of Discharge: 10/06/2011 Attending Physician: Kela Millin, MD  Discharge Diagnosis: Principal Problem:  *Streptococcal endocarditis Active Problems:  HYPOTHYROIDISM  HYPERLIPIDEMIA  HYPERTENSION  COPD UNSPECIFIED  VERTIGO  Obstructive sleep apnea on nocturnal CPAP  Leukocytosis  Acute renal failure  Elevated troponin  Pulmonary HTN-severe- due to diastolic dysfunction  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  Nausea with vomiting  Diarrhea  Volume depletion, gastrointestinal loss  Normocytic anemia  Hyperglycemia   Discharge Medications: Medication List  As of 10/06/2011  2:44 PM   STOP taking these medications         cloNIDine 0.3 mg/24hr      valsartan 160 MG tablet         TAKE these medications         ALPRAZolam 0.5 MG tablet   Commonly known as: XANAX   Take 0.5 mg by mouth at bedtime as needed. For anxiety      amLODipine 10 MG tablet   Commonly known as: NORVASC   Take 1 tablet (10 mg total) by mouth daily.      aspirin 81 MG tablet   Take 81 mg by mouth daily.      cloNIDine 0.1 mg/24hr patch   Commonly known as: CATAPRES - Dosed in mg/24 hr   Place 1 patch (0.1 mg total) onto the skin once a week.      clopidogrel 75 MG tablet   Commonly known as: PLAVIX   Take 75 mg by mouth daily.      dextrose 5 % SOLN 50 mL with cefTRIAXone 2 G SOLR 2 g   Inject 2 g into the vein daily.      escitalopram 20 MG tablet   Commonly known as: LEXAPRO   Take 20 mg by mouth daily.      esomeprazole 40 MG capsule   Commonly known as: NEXIUM   Take 40 mg by mouth daily as needed. For reflux      furosemide 40 MG tablet   Commonly known as: LASIX   Take 1 tablet (40 mg total) by mouth 2 (two) times daily.      hydrALAZINE 50 MG tablet   Commonly known as: APRESOLINE   Take 1 tablet (50 mg  total) by mouth every 6 (six) hours.      insulin glargine 100 UNIT/ML injection   Commonly known as: LANTUS   Inject 62 Units into the skin daily.      insulin lispro 100 UNIT/ML injection   Commonly known as: HUMALOG   Inject 10-20 Units into the skin 3 (three) times daily with meals. 10 units with breakfast, 10 units with lunch, 20 units with dinner      isosorbide mononitrate 60 MG 24 hr tablet   Commonly known as: IMDUR   Take 60 mg by mouth daily.      levothyroxine 112 MCG tablet   Commonly known as: SYNTHROID, LEVOTHROID   Take 112 mcg by mouth daily.      nebivolol 2.5 MG tablet   Commonly known as: BYSTOLIC   Take 2.5 mg by mouth daily.      omega-3 acid ethyl esters 1 G capsule   Commonly known as: LOVAZA   Take 2 g by mouth 2 (two) times daily.      omeprazole 40 MG capsule   Commonly known as: PRILOSEC  Take 1 capsule (40 mg total) by mouth daily.      oxyCODONE-acetaminophen 5-325 MG per tablet   Commonly known as: PERCOCET   Take 2 tablets by mouth every 8 (eight) hours as needed for pain.      potassium chloride SA 20 MEQ tablet   Commonly known as: K-DUR,KLOR-CON   Take 20 mEq by mouth 2 (two) times daily.      predniSONE 20 MG tablet   Commonly known as: DELTASONE   Take 1 tablet (20 mg total) by mouth daily with breakfast.      Vitamin D3 2000 UNITS Tabs   Take 1 tablet by mouth at bedtime.           Pt instructed to stay off plavix as directed per GI till after her colonoscopy  Disposition and follow-up:   [name] was discharged from [facility] in stable/improved condition.    Follow-up Appointments: Discharge Orders    Future Appointments: Provider: Department: Dept Phone: Center:   10/07/2011 11:30 AM [facility] Rm [facility] [phone] [facility]   10/11/2011 8:00 AM Iva Boop, MD [facility] [phone] [facility]   11/01/2011 9:00 AM Gardiner Barefoot, MD [facility] [phone] [facility]     Future Orders Please Complete By Expires   Diet Carb Modified      Increase activity slowly         Consultations:   Infectious disease-Dr. Luciana Axe Cardiology-Dr. [facility] Procedures Performed:  Dg Chest 2 View  09/28/2011  *RADIOLOGY REPORT*  Clinical Data: Leukocytosis.  Weakness.  Tachycardia.  CHEST - 2 VIEW  Comparison: 09/27/2011  Findings: Lung volumes are low. Cardiopericardial silhouette is at upper limits of normal for size. Interstitial markings are diffusely coarsened with chronic features.  No edema or focal airspace consolidation.  No pleural effusion. Imaged bony structures of the thorax are intact.  IMPRESSION: Low volume film with borderline cardiomegaly underlying chronic interstitial coarsening.  No interval change.  Original Report Authenticated By: ERIC A. MANSELL, M.D.   Dg Chest 2 View  09/27/2011  *RADIOLOGY REPORT*  Clinical Data: Chest discomfort.  CHEST - 2 VIEW  Comparison: 02/10/2011  Findings: The lungs are clear without focal infiltrate, edema, pneumothorax or pleural effusion. Interstitial markings are diffusely coarsened with chronic features.  Cardiopericardial silhouette is at upper limits of normal for size. Imaged bony structures of the thorax are intact.  IMPRESSION: Stable.  No acute findings.  Original Report Authenticated By: ERIC A. MANSELL, M.D.   Ct Head Wo Contrast  09/27/2011  *RADIOLOGY REPORT*  Clinical Data: Nausea, vomiting, diarrhea for 24 hours.  Weakness, numbness and parasthesias of all four extremities.  Headache.  CT HEAD WITHOUT CONTRAST  Technique:  Contiguous axial images were obtained from the base of the skull through the vertex without contrast.  Comparison: 05/26/2006.  Findings: No mass lesion, mass effect, midline shift, hydrocephalus, hemorrhage.  No acute territorial cortical ischemia/infarct. Atrophy and chronic ischemic white matter disease is present.  Paranasal sinuses appear normal.  Mastoid air cells are  within normal limits.  Intracranial atherosclerosis.  IMPRESSION: Mild atrophy and chronic ischemic white matter disease without acute intracranial abnormality.  Original Report Authenticated By: Andreas Newport, M.D.   Nm Myocar Multi W/spect W/wall Motion / Ef  09/30/2011  *RADIOLOGY REPORT*  Clinical Data:  Chest pain  MYOCARDIAL IMAGING WITH SPECT (REST AND PHARMACOLOGIC-STRESS) GATED LEFT VENTRICULAR WALL MOTION STUDY LEFT VENTRICULAR EJECTION FRACTION  Technique:  Standard myocardial  SPECT imaging was performed after resting intravenous injection of 10 mCi Tc-13m tetrofosmin. Subsequently, intravenous infusion of Lexiscan was performed under the supervision of the Cardiology staff.  At peak effect of the drug, 30 mCi Tc-49m tetrofosmin was injected intravenously and standard myocardial SPECT  imaging was performed.  Quantitative gated imaging was also performed to evaluate left ventricular wall motion, and estimate left ventricular ejection fraction.  Comparison:  None.  Findings:  Spect:  No perfusion defects.  Wall motion:  Normal motion.  Ejection fraction:  61%.  End diastolic volume 93 ml.  End-systolic volume 37 ml.  IMPRESSION: No perfusion defects.  Original Report Authenticated By: Donavan Burnet, M.D.   [place] Extrem Up Right Ltd  09/07/2011  *RADIOLOGY REPORT*  Clinical Data: Palpable abnormality high proximal medial right arm versus axilla  ULTRASOUND RIGHT UPPER EXTREMITY LIMITED  Technique:  Ultrasound examination of the region of interest in the right upper extremity was performed.  Comparison:  None  Findings: I personally examined the patient and imaged through the region of clinical concern. Small deep normal-sized right axillary lymph node is visualized in this general vicinity, deeper than expected for the palpable finding. Normal appearing subcutaneous fat and soft tissues visualized. No evidence of mass, adenopathy or focal fluid collection identified at the site of clinical concern.   IMPRESSION: Negative ultrasound of the site of clinical concern at the right axilla/proximal right upper arm medially. Clinical management of this site is recommended.  Original Report Authenticated By: Lollie Marrow, M.D.   Dg Chest Port 1 View  10/02/2011  *RADIOLOGY REPORT*  Clinical Data: Dyspnea and weakness.  PORTABLE CHEST - 1 VIEW  Comparison: 09/28/2011  Findings: Mild cardiac enlargement with normal pulmonary vascularity.  Emphysematous changes with interstitial fibrosis suggested.  No blunting of costophrenic angles.  No focal airspace consolidation.  No pneumothorax.  IMPRESSION: Stable cardiac enlargement.  No evidence of vascular congestion or edema.  No focal airspace disease.  Original Report Authenticated By: Marlon Pel, M.D.   PICC line placement  2D Echo Study Conclusions  - Left ventricle: The cavity size was normal. Wall thickness was increased in a pattern of moderate LVH. Systolic function was normal. The estimated ejection fraction was in the range of 55% to 60%. Wall motion was normal; there were no regional wall motion abnormalities. Features are consistent with a pseudonormal left ventricular filling pattern, with concomitant abnormal relaxation and increased filling pressure (grade 2 diastolic dysfunction). Doppler parameters are consistent with elevated mean left atrial filling pressure. - Aortic valve: A hypermobile echogenicity is seen in the LV outflow tract and is probably attached to the aortic valve. In the right clinical context this could represent a vegetation. Trivial regurgitation. - Mitral valve: A hypermobile echogenicity is seen on the anterior mitral valve. In the right clinical context this could represent a vegetation. Calcified annulus. Valve area by pressure half-time: 2.34cm^2. - Left atrium: The atrium was mildly dilated. - Right atrium: The atrium was mildly dilated. - Pulmonary arteries: Systolic pressure was moderately  to severely increased. PA peak pressure: 63mm Hg (S). - Pericardium, extracardiac: A trivial pericardial effusion was identified. Impressions:  - Unusual filamentous mobile echogenicities are seen on the mitral andaortic valve, without associated valvular hemodynamically significant abnormalities. Consider Libman-Sacks endocarditis. Severe diastolic dysfunction and moderate to severe pulmonary hypertension are similar to previous studies. A transesophageal echo will offer better visualization of the valvular abnormalities, but may not clarify the diagnosis. If infective endocarditis is suspected, a  TEE is indicated.      Admission HPI HPI: 66 year-old female with known history of diabetes mellitus2, hypertension, hypothyroidism, renal artery stenosis status post stent placement and chronic shortness of breath presented to the ER because of weakness. Patient has been feeling weak over the last 3-4 days with feeling dizzy whenever she stands up and having nausea vomiting and diarrhea. She had at least 3 episodes each day and her diarrhea has stopped. Patient got her PCP and as the patient is to start on steroids. Despite this patient is to very weak and came to the ER. Patient also has subjective feeling of fever chills. In the ER patient was found to have leukocytosis and chest x-ray and UA did not show any definite source for infection. Patient's cardiac enzymes eventually has been positive but EKG shows poor R-wave progression with no definite ST elevation and patient has no chest pain. Patient's creatinine is also increased from her baseline. At this time patient has been admitted for further workup.  Physical exam General: Comfortable, alert, communicative, fully oriented, not short of breath at rest.  HEENT: Mild clinical pallor, no jaundice, no conjunctival injection or discharge. Hydration status appears fair.  NECK: Supple, JVP not seen, no carotid bruits, no palpable  lymphadenopathy, no palpable goiter.  CHEST: Clinically clear to auscultation, no wheezes, no crackles.  HEART: Sounds 1 and 2 heard, normal, regular, no murmurs.  ABDOMEN: Moderately obese, soft, non-tender, no palpable organomegaly, no palpable masses, normal bowel sounds.  LOWER EXTREMITIES: No pitting edema, palpable peripheral pulses.  CENTRAL NERVOUS SYSTEM: No focal neurologic deficit on gross examination  Hospital Course by problem list: Principal Problem:  *Streptococcal endocarditis Active Problems:  HYPOTHYROIDISM  HYPERLIPIDEMIA  HYPERTENSION  COPD UNSPECIFIED  VERTIGO  Obstructive sleep apnea on nocturnal CPAP  Leukocytosis  Acute renal failure  Elevated troponin  Pulmonary HTN-severe- due to diastolic dysfunction  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  Nausea with vomiting  Diarrhea  Volume depletion, gastrointestinal loss  Normocytic anemia  Hyperglycemia  1. Elevated troponin: Patient presented with weakness, dyspnea and elevated cardiac enzymes, against a background of ARF, due to dehydration, from G/enteritis and diuretics. Cardiology consultation was provided by Dr Thurmon Fair. Patient is s/p cardiac catheterization in 2012 with normal coronary arteries and LV function; and apparently has also undergone right and left heart catheterization in the past year at [facility] for evaluation of her pulmonary hypertension. Stress Myoview of 09/30/11, showed no perfusion defects, normal wall motion motion, ejection fraction: 61%. End diastolic volume 93 ml. End-systolic volume 37 ml. Renal function militated against contrast procedures. Per cardiologist, negative Myoview re-enforces recent coronary angiography, in confirming that positive Troponin is likely not ACS, rather due to pressure/?volume overload of RV. In view of current clinical stability, and subsequent infective developments, no further cardiac work up is contemplated at this time.  Patient has had recurrent sinus bradycardia, in the 40s, usually at night-time, and this has responded to reduction of Clonidine dose. Dr Rennis Golden has recommended to wean off with maintaining 0.1 mg patch for 1 week then discontinue. Cardiology team signed off on 10/03/11, and will arrange follow up with Dr Allyson Sabal in [place] office.  2. Nausea with vomiting and Diarrhea: Patient had 3-4 days of vomiting/diarrhea, pre--admission. This has resolved, with no further GI symptoms documented during this hospitalization. C. Diff PCR and enteric precautions have therefore, been discontinued. Likely, this was a self-limited acute viral gastro-enteritis.  3. Volume depletion, gastrointestinal loss /Acute renal failure:  Patient's creatinine was 2.20 on admission, with BUN of 57, consistent with ARF. This was secondary to volume loss, due to gastroenteritis and ongoing Lasix treatment. Diuretics and ARB were discontinued, patient was managed with ivi fluids, with satisfactory response. As of 09/30/11, creatinine was much improved at 1.60. Baseline creatinine was 1.2-1.8 one year prior. IV fluids were discontinued on 09/30/11. Renal indices are now stable. Creatinine was 1.37 on 10/04/11.  -Creatinine remained stable at 1.38 today  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 Septic work up was commenced, and Vancomycin started. Blood cultures grew Streptococcus Gallolyticus (formerly Streptococcus Bovis) in 4:4 blood cultures and 2D Echocardiogram of 09/30/11, showed normal left ventricular cavity size, moderate LVH, ejection fraction of 55% to 60% and no regional wall motion abnormalities. Unusual filamentous mobile echogenicities are seen on the mitral and tricuspid valve, without associated valvular hemodynamically significant abnormalities. Both atria are mildly dilated. PA peak pressure: 63mm Hg (S). Clinically, patient has bacterial endocarditis, based on the above  constellation of findings. Dr Cliffton Asters provided ID consultation, and he has recommended high dose Rocephin therapy. It is felt that TEE would not provide any further benefit. Clinically, patient feels much better, is apyrexial, and wcc is trending down nicely. The plan is to await negative blood cultures x 72 hours (this will be achieved on 10/05/11), then establish PICC for duration of antibiotics. GI consultation was provided by Dr Stan Head, but colonoscopy had to be cancelled, as patient is still On Plavix. GI instructed patient to stay off Plavix until after colonoscopy that is scheduled for 10/11/2011-The patient's repeat blood cultures  were showing no growth at 72 hours on 5/15 and patient remaining afebrile.  And PICC line placement was ordered for outpatient antibiotics. Dr. Luciana Axe followed up and recommended antibiotics for 4 weeks and he will arrange for patient to followup at the ID clinic. 5. Pulmonary HTN-severe- due to diastolic dysfunction  Patient has undergone extensive evaluation at [facility] and based on recent right heart catheterization the etiology for her pulmonary hypertension was diastolic dysfunction. She is on afterload reduction with nitrates and anticoagulation has been discontinued on cardiology recommendations, in view of endocarditis.  6. 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 Patient was recently evaluated by ENT and started on a two-week course of prednisone 20 mg daily, which we continued in the hospital and she is to continue as previously and follow up outpatient. 7. 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  Continue plavix  8. HYPERLIPIDEMIA:  History of statin intolerance. See # 6. Continue omega-3 fatty acids  9. HYPOTHYROIDISM:  Continued on Levothyroxine.  10.  HYPERTENSION,uncontrolled:  Patient was on multiple antihypertensives in the hospital and her blood pressures were poorly controlled. Cardiology followed and recommended weaning her off of clonidine and Hydralazine was started on 10/03/11 titrated to 50 mg 4 times a day. She was maintained on Imdur as well as Norvasc. The patient has been instructed to stay on the clonidine patche 0.1 mg for one week and then to discontinue this and followup up outpatient with his PCP and cardiology for further monitoring of her blood pressures and adjustment of her medications as appropriate for optimal blood pressure control. -Will increase hydralazine to 4 times a day, continue monitoring for improved control and further manage as appropriate.  11. COPD/Obstructive  sleep apnea on nocturnal CPAP:  Clinically stable. She was Placed on nocturnal CPAP.  12. Type 2 Diabetes Mellitus:  she was placed on Lantus and sliding scale insulin as well as modified carbohydrate diet in the hospital and she is to continue these upon discharge.  Discharge Vitals:  BP 177/46  Pulse 58  Temp(Src) 98.2 F (36.8 C) (Oral)  Resp 18  Ht 5\' 3"  (1.6 m)  Wt 81.8 kg (180 lb 5.4 oz)  BMI 31.95 kg/m2  SpO2 99%  Discharge Labs:  Results for orders placed during the hospital encounter of 09/28/11 (from the past 24 hour(s))  GLUCOSE, CAPILLARY     Status: Abnormal   Collection Time   10/05/11  5:18 PM      Component Value Range   Glucose-Capillary 167 (*) 70 - 99 (mg/dL)   Comment 1 Notify RN     Comment 2 Documented in Chart    GLUCOSE, CAPILLARY     Status: Abnormal   Collection Time   10/05/11  9:07 PM      Component Value Range   Glucose-Capillary 162 (*) 70 - 99 (mg/dL)   Comment 1 Notify RN     Comment 2 Documented in Chart    CBC     Status: Abnormal   Collection Time   10/06/11  6:05 AM      Component Value Range   WBC 16.7 (*) 4.0 - 10.5 (K/uL)   RBC 3.95  3.87 - 5.11 (MIL/uL)   Hemoglobin 12.1  12.0 - 15.0 (g/dL)    HCT 78.2 (*) 95.6 - 46.0 (%)   MCV 90.4  78.0 - 100.0 (fL)   MCH 30.6  26.0 - 34.0 (pg)   MCHC 33.9  30.0 - 36.0 (g/dL)   RDW 21.3  08.6 - 57.8 (%)   Platelets 402 (*) 150 - 400 (K/uL)  GLUCOSE, CAPILLARY     Status: Normal   Collection Time   10/06/11  6:40 AM      Component Value Range   Glucose-Capillary 96  70 - 99 (mg/dL)  GLUCOSE, CAPILLARY     Status: Abnormal   Collection Time   10/06/11  7:57 AM      Component Value Range   Glucose-Capillary 142 (*) 70 - 99 (mg/dL)  GLUCOSE, CAPILLARY     Status: Abnormal   Collection Time   10/06/11 12:03 PM      Component Value Range   Glucose-Capillary 120 (*) 70 - 99 (mg/dL)    Signed: Kela Millin 10/06/2011, 2:44 PM

## 2011-10-06 NOTE — Progress Notes (Signed)
Pt D/c to home. D/C instructions and medications reviewed w/PT. Pt questions answered. Pt states understanding,.

## 2011-10-08 LAB — CULTURE, BLOOD (ROUTINE X 2)
Culture  Setup Time: 201305121052
Culture  Setup Time: 201305121052
Culture: NO GROWTH

## 2011-10-09 ENCOUNTER — Encounter (HOSPITAL_COMMUNITY): Payer: Self-pay | Admitting: *Deleted

## 2011-10-09 ENCOUNTER — Emergency Department (HOSPITAL_COMMUNITY): Payer: BC Managed Care – PPO

## 2011-10-09 ENCOUNTER — Observation Stay (HOSPITAL_COMMUNITY)
Admission: EM | Admit: 2011-10-09 | Discharge: 2011-10-13 | Disposition: A | Discharge status: Home / Self Care | Payer: BC Managed Care – PPO | Source: Ambulatory Visit | Attending: Internal Medicine | Admitting: Internal Medicine

## 2011-10-09 DIAGNOSIS — R42 Dizziness and giddiness: Secondary | ICD-10-CM

## 2011-10-09 DIAGNOSIS — E039 Hypothyroidism, unspecified: Secondary | ICD-10-CM | POA: Diagnosis present

## 2011-10-09 DIAGNOSIS — I330 Acute and subacute infective endocarditis: Secondary | ICD-10-CM | POA: Insufficient documentation

## 2011-10-09 DIAGNOSIS — E785 Hyperlipidemia, unspecified: Secondary | ICD-10-CM | POA: Diagnosis present

## 2011-10-09 DIAGNOSIS — R079 Chest pain, unspecified: Secondary | ICD-10-CM | POA: Diagnosis present

## 2011-10-09 DIAGNOSIS — D72829 Elevated white blood cell count, unspecified: Secondary | ICD-10-CM | POA: Insufficient documentation

## 2011-10-09 DIAGNOSIS — J4489 Other specified chronic obstructive pulmonary disease: Secondary | ICD-10-CM | POA: Diagnosis present

## 2011-10-09 DIAGNOSIS — K219 Gastro-esophageal reflux disease without esophagitis: Secondary | ICD-10-CM | POA: Diagnosis present

## 2011-10-09 DIAGNOSIS — H6690 Otitis media, unspecified, unspecified ear: Secondary | ICD-10-CM

## 2011-10-09 DIAGNOSIS — I119 Hypertensive heart disease without heart failure: Secondary | ICD-10-CM | POA: Diagnosis present

## 2011-10-09 DIAGNOSIS — E119 Type 2 diabetes mellitus without complications: Secondary | ICD-10-CM | POA: Insufficient documentation

## 2011-10-09 DIAGNOSIS — A491 Streptococcal infection, unspecified site: Secondary | ICD-10-CM | POA: Insufficient documentation

## 2011-10-09 DIAGNOSIS — R1013 Epigastric pain: Secondary | ICD-10-CM | POA: Insufficient documentation

## 2011-10-09 DIAGNOSIS — Z1211 Encounter for screening for malignant neoplasm of colon: Secondary | ICD-10-CM

## 2011-10-09 DIAGNOSIS — I701 Atherosclerosis of renal artery: Secondary | ICD-10-CM | POA: Diagnosis not present

## 2011-10-09 DIAGNOSIS — IMO0001 Reserved for inherently not codable concepts without codable children: Secondary | ICD-10-CM

## 2011-10-09 DIAGNOSIS — J449 Chronic obstructive pulmonary disease, unspecified: Secondary | ICD-10-CM

## 2011-10-09 DIAGNOSIS — N179 Acute kidney failure, unspecified: Secondary | ICD-10-CM

## 2011-10-09 DIAGNOSIS — G608 Other hereditary and idiopathic neuropathies: Secondary | ICD-10-CM

## 2011-10-09 DIAGNOSIS — I5031 Acute diastolic (congestive) heart failure: Secondary | ICD-10-CM | POA: Insufficient documentation

## 2011-10-09 DIAGNOSIS — R112 Nausea with vomiting, unspecified: Secondary | ICD-10-CM

## 2011-10-09 DIAGNOSIS — D126 Benign neoplasm of colon, unspecified: Secondary | ICD-10-CM

## 2011-10-09 DIAGNOSIS — R0789 Other chest pain: Principal | ICD-10-CM | POA: Insufficient documentation

## 2011-10-09 DIAGNOSIS — I10 Essential (primary) hypertension: Secondary | ICD-10-CM | POA: Diagnosis present

## 2011-10-09 DIAGNOSIS — B955 Unspecified streptococcus as the cause of diseases classified elsewhere: Secondary | ICD-10-CM | POA: Diagnosis present

## 2011-10-09 DIAGNOSIS — G4733 Obstructive sleep apnea (adult) (pediatric): Secondary | ICD-10-CM | POA: Insufficient documentation

## 2011-10-09 DIAGNOSIS — M6281 Muscle weakness (generalized): Secondary | ICD-10-CM

## 2011-10-09 DIAGNOSIS — G4730 Sleep apnea, unspecified: Secondary | ICD-10-CM | POA: Diagnosis present

## 2011-10-09 DIAGNOSIS — I509 Heart failure, unspecified: Secondary | ICD-10-CM | POA: Insufficient documentation

## 2011-10-09 DIAGNOSIS — R739 Hyperglycemia, unspecified: Secondary | ICD-10-CM

## 2011-10-09 DIAGNOSIS — I272 Pulmonary hypertension, unspecified: Secondary | ICD-10-CM | POA: Diagnosis present

## 2011-10-09 DIAGNOSIS — D649 Anemia, unspecified: Secondary | ICD-10-CM

## 2011-10-09 DIAGNOSIS — R339 Retention of urine, unspecified: Secondary | ICD-10-CM | POA: Insufficient documentation

## 2011-10-09 DIAGNOSIS — R197 Diarrhea, unspecified: Secondary | ICD-10-CM

## 2011-10-09 DIAGNOSIS — E7800 Pure hypercholesterolemia, unspecified: Secondary | ICD-10-CM | POA: Insufficient documentation

## 2011-10-09 DIAGNOSIS — E869 Volume depletion, unspecified: Secondary | ICD-10-CM

## 2011-10-09 DIAGNOSIS — E1129 Type 2 diabetes mellitus with other diabetic kidney complication: Secondary | ICD-10-CM | POA: Diagnosis present

## 2011-10-09 HISTORY — DX: Other specified abnormal findings of blood chemistry: R79.89

## 2011-10-09 HISTORY — DX: Heart disease, unspecified: I51.9

## 2011-10-09 LAB — POCT I-STAT, CHEM 8
BUN: 66 mg/dL — ABNORMAL HIGH (ref 6–23)
Calcium, Ion: 1.19 mmol/L (ref 1.12–1.32)
Chloride: 107 mEq/L (ref 96–112)
Creatinine, Ser: 1.7 mg/dL — ABNORMAL HIGH (ref 0.50–1.10)
Glucose, Bld: 78 mg/dL (ref 70–99)
HCT: 36 % (ref 36.0–46.0)

## 2011-10-09 LAB — DIFFERENTIAL
Basophils Absolute: 0.1 10*3/uL (ref 0.0–0.1)
Eosinophils Relative: 1 % (ref 0–5)
Lymphocytes Relative: 17 % (ref 12–46)
Monocytes Absolute: 1.2 10*3/uL — ABNORMAL HIGH (ref 0.1–1.0)

## 2011-10-09 LAB — CBC
HCT: 35.8 % — ABNORMAL LOW (ref 36.0–46.0)
MCHC: 33.2 g/dL (ref 30.0–36.0)
MCV: 91.1 fL (ref 78.0–100.0)
RDW: 13.6 % (ref 11.5–15.5)

## 2011-10-09 LAB — URINALYSIS, ROUTINE W REFLEX MICROSCOPIC
Bilirubin Urine: NEGATIVE
Ketones, ur: NEGATIVE mg/dL
Nitrite: NEGATIVE
pH: 6 (ref 5.0–8.0)

## 2011-10-09 NOTE — ED Notes (Signed)
I gave the patient a pair of green socks. 

## 2011-10-09 NOTE — ED Notes (Signed)
IV team stated they will come draw abs on pt through PICC line

## 2011-10-09 NOTE — ED Notes (Signed)
Per EMS: pt was having CP all day, pt was treated and released from [facility] on Tuesday for pericarditis. Pt has PICC line placed. Pt was given 324mg  of asprin and 3 Sprays of nitro and is wearing a 1 inch nitro paste. Pt started as a pain of 5/10 and is currently a 0/10.

## 2011-10-09 NOTE — ED Provider Notes (Signed)
History     CSN: [id number]  Arrival date & time 10/09/11  2134   First MD Initiated Contact with Patient 10/09/11 2212      Chief Complaint  Patient presents with  . Chest Pain    (Consider location/radiation/quality/duration/timing/severity/associated sxs/prior treatment) HPI Complains of anterior chest pain left sided nonradiating pleuritic onset this morning. No other associated symptoms EMS treated patient with supplement oxygen 3 sublingual nitroglycerin and aspirin with complete relief . She presently denies any chest pain. Patient recently hospitalized diagnosed with bacterial endocarditis risk currently receiving antibiotics intravenously at home via PICC line. Patient had negative cardiac catheterization 2012 and negative stress Myoview 09/30/2011 Past Medical History  Diagnosis Date  . HTN (hypertension)   . Heart attack   . CHF (congestive heart failure)   . Diabetes mellitus   . High cholesterol   . Kidney disease   . Cataracts, bilateral   . 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 Obstructive sleep apnea on nocturnal CPAP 06/20/2008    Qualifier: Diagnosis of  By: Vernie Murders    . Pulmonary HTN-severe- due to diastolic dysfunction 09/29/2011  . Acute renal failure 09/29/2011  . 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 HYPOTHYROIDISM 06/19/2008    Qualifier: Diagnosis of  By: Excell Seltzer CMA, Lawson Fiscal    . Renal artery stenosis     s/p stents (restenosis)  . Subclavian arterial stenosis   . Peripheral neuropathy     Past Surgical History  Procedure Date  . Cardiac catheterization   . Laparoscopic tubal ligation   . Renal artery stent     x 2 (restenosis)    Family History  Problem Relation Age of Onset  . Heart disease Mother   . Heart disease Father   . Heart disease Sister   . Heart disease Daughter     History  Substance Use Topics  . Smoking status: Former Smoker -- 1.0  packs/day for 25 years    Types: Cigarettes    Quit date: 05/23/2004  . Smokeless tobacco: Not on file  . Alcohol Use: No    OB History    Grav Para Term Preterm Abortions TAB SAB Ect Mult Living                  Review of Systems  Constitutional: Negative.   HENT: Negative.   Respiratory: Negative.   Cardiovascular: Positive for chest pain.  Gastrointestinal: Negative.   Genitourinary: Positive for difficulty urinating.  Musculoskeletal: Negative.   Skin: Negative.   Neurological: Negative.   Hematological: Negative.   Psychiatric/Behavioral: Negative.   All other systems reviewed and are negative.    Allergies  Atenolol; Bystolic; Ciprofloxacin; Codeine; Lidocaine; Metformin; Metoprolol; and Penicillins  Home Medications   Current Outpatient Rx  Name Route Sig Dispense Refill  . ALPRAZOLAM 0.5 MG PO TABS Oral Take 0.5 mg by mouth at bedtime as needed. For anxiety    . AMLODIPINE BESYLATE 10 MG PO TABS Oral Take 1 tablet (10 mg total) by mouth daily. 30 tablet 0  . ASPIRIN 81 MG PO TABS Oral Take 81 mg by mouth daily.      Marland Kitchen VITAMIN D3 2000 UNITS PO TABS Oral Take 1 tablet by mouth at bedtime.      Marland Kitchen CLONIDINE HCL 0.1 MG/24HR TD PTWK Transdermal Place 1 patch (0.1 mg total) onto the skin once a week. 1 patch 0  . CEFTRIAXONE 2 G/50 ML IVPB MIXTURE Intravenous Inject 2  g into the vein daily. 28 Syringe 0  . ESCITALOPRAM OXALATE 20 MG PO TABS Oral Take 20 mg by mouth daily.      Marland Kitchen ESOMEPRAZOLE MAGNESIUM 40 MG PO CPDR Oral Take 40 mg by mouth daily as needed. For reflux    . FUROSEMIDE 40 MG PO TABS Oral Take 1 tablet (40 mg total) by mouth 2 (two) times daily. 60 tablet 0  . HYDRALAZINE HCL 50 MG PO TABS Oral Take 1 tablet (50 mg total) by mouth every 6 (six) hours. 120 tablet 0  . INSULIN GLARGINE 100 UNIT/ML [place] SOLN Subcutaneous Inject 62 Units into the skin daily.     . INSULIN LISPRO (HUMAN) 100 UNIT/ML [place] SOLN Subcutaneous Inject 10-20 Units into the skin 3 (three)  times daily with meals. 10 units with breakfast, 10 units with lunch, 20 units with dinner    . ISOSORBIDE MONONITRATE ER 60 MG PO TB24 Oral Take 60 mg by mouth daily.      Marland Kitchen LEVOTHYROXINE SODIUM 112 MCG PO TABS Oral Take 112 mcg by mouth daily.      . NEBIVOLOL HCL 2.5 MG PO TABS Oral Take 2.5 mg by mouth daily.      . OMEGA-3-ACID ETHYL ESTERS 1 G PO CAPS Oral Take 2 g by mouth 2 (two) times daily.    Marland Kitchen OMEPRAZOLE 40 MG PO CPDR Oral Take 1 capsule (40 mg total) by mouth daily. 30 capsule 0  . POTASSIUM CHLORIDE CRYS ER 20 MEQ PO TBCR Oral Take 20 mEq by mouth 2 (two) times daily.      Marland Kitchen PREDNISONE 20 MG PO TABS Oral Take 1 tablet (20 mg total) by mouth daily with breakfast.      Continue as previously    BP 131/73  Pulse 59  Temp 98.1 F (36.7 C)  Resp 18  SpO2 99%  Physical Exam  Nursing note and vitals reviewed. Constitutional: She appears well-developed and well-nourished.  HENT:  Head: Normocephalic and atraumatic.  Eyes: Conjunctivae are normal. Pupils are equal, round, and reactive to light.  Neck: Neck supple. No tracheal deviation present. No thyromegaly present.  Cardiovascular: Normal rate and regular rhythm.   No murmur heard. Pulmonary/Chest: Effort normal and breath sounds normal.  Abdominal: Soft. Bowel sounds are normal. She exhibits no distension. There is no tenderness.       Obese  Musculoskeletal: Normal range of motion. She exhibits no edema and no tenderness.  Neurological: She is alert. Coordination normal.  Skin: Skin is warm and dry. No rash noted.  Psychiatric: She has a normal mood and affect.    Date: 10/09/2011  Rate: 65  Rhythm: normal sinus rhythm  QRS Axis: left  Intervals: normal  ST/T Wave abnormalities: nonspecific ST/T changes  Conduction Disutrbances:none  Narrative Interpretation:   Old EKG Reviewed: unchanged No significant change from 09/28/2011 ED Course  Procedures (including critical care time) Foley catheter inserted  approximately 500 mL of urine output in the Foley catheter.At 1245 am c/o generalized weakness, no other complaint Labs Reviewed - No data to display No results found.   No diagnosis found. Results for orders placed during the hospital encounter of 10/09/11  CBC      Component Value Range   WBC 18.0 (*) 4.0 - 10.5 (K/uL)   RBC 3.93  3.87 - 5.11 (MIL/uL)   Hemoglobin 11.9 (*) 12.0 - 15.0 (g/dL)   HCT 46.9 (*) 62.9 - 46.0 (%)   MCV 91.1  78.0 - 100.0 (  fL)   MCH 30.3  26.0 - 34.0 (pg)   MCHC 33.2  30.0 - 36.0 (g/dL)   RDW 16.1  09.6 - 04.5 (%)   Platelets 375  150 - 400 (K/uL)  DIFFERENTIAL      Component Value Range   Neutrophils Relative 76  43 - 77 (%)   Neutro Abs 13.6 (*) 1.7 - 7.7 (K/uL)   Lymphocytes Relative 17  12 - 46 (%)   Lymphs Abs 3.0  0.7 - 4.0 (K/uL)   Monocytes Relative 7  3 - 12 (%)   Monocytes Absolute 1.2 (*) 0.1 - 1.0 (K/uL)   Eosinophils Relative 1  0 - 5 (%)   Eosinophils Absolute 0.1  0.0 - 0.7 (K/uL)   Basophils Relative 1  0 - 1 (%)   Basophils Absolute 0.1  0.0 - 0.1 (K/uL)  URINALYSIS, ROUTINE W REFLEX MICROSCOPIC      Component Value Range   Color, Urine YELLOW  YELLOW    APPearance CLEAR  CLEAR    Specific Gravity, Urine 1.009  1.005 - 1.030    pH 6.0  5.0 - 8.0    Glucose, UA NEGATIVE  NEGATIVE (mg/dL)   Hgb urine dipstick NEGATIVE  NEGATIVE    Bilirubin Urine NEGATIVE  NEGATIVE    Ketones, ur NEGATIVE  NEGATIVE (mg/dL)   Protein, ur NEGATIVE  NEGATIVE (mg/dL)   Urobilinogen, UA 0.2  0.0 - 1.0 (mg/dL)   Nitrite NEGATIVE  NEGATIVE    Leukocytes, UA NEGATIVE  NEGATIVE   POCT I-STAT, CHEM 8      Component Value Range   Sodium 138  135 - 145 (mEq/L)   Potassium 3.9  3.5 - 5.1 (mEq/L)   Chloride 107  96 - 112 (mEq/L)   BUN 66 (*) 6 - 23 (mg/dL)   Creatinine, Ser 4.09 (*) 0.50 - 1.10 (mg/dL)   Glucose, Bld 78  70 - 99 (mg/dL)   Calcium, Ion 8.11  9.14 - 1.32 (mmol/L)   TCO2 23  0 - 100 (mmol/L)   Hemoglobin 12.2  12.0 - 15.0 (g/dL)   HCT  78.2  95.6 - 21.3 (%)  POCT I-STAT TROPONIN I      Component Value Range   Troponin i, poc 0.15 (*) 0.00 - 0.08 (ng/mL)   Comment NOTIFIED PHYSICIAN     Comment 3            Dg Chest 2 View  09/28/2011  *RADIOLOGY REPORT*  Clinical Data: Leukocytosis.  Weakness.  Tachycardia.  CHEST - 2 VIEW  Comparison: 09/27/2011  Findings: Lung volumes are low. Cardiopericardial silhouette is at upper limits of normal for size. Interstitial markings are diffusely coarsened with chronic features.  No edema or focal airspace consolidation.  No pleural effusion. Imaged bony structures of the thorax are intact.  IMPRESSION: Low volume film with borderline cardiomegaly underlying chronic interstitial coarsening.  No interval change.  Original Report Authenticated By: ERIC A. MANSELL, M.D.   Dg Chest 2 View  09/27/2011  *RADIOLOGY REPORT*  Clinical Data: Chest discomfort.  CHEST - 2 VIEW  Comparison: 02/10/2011  Findings: The lungs are clear without focal infiltrate, edema, pneumothorax or pleural effusion. Interstitial markings are diffusely coarsened with chronic features.  Cardiopericardial silhouette is at upper limits of normal for size. Imaged bony structures of the thorax are intact.  IMPRESSION: Stable.  No acute findings.  Original Report Authenticated By: ERIC A. MANSELL, M.D.   Ct Head Wo Contrast  09/27/2011  *RADIOLOGY REPORT*  Clinical Data: Nausea, vomiting, diarrhea for 24 hours.  Weakness, numbness and parasthesias of all four extremities.  Headache.  CT HEAD WITHOUT CONTRAST  Technique:  Contiguous axial images were obtained from the base of the skull through the vertex without contrast.  Comparison: 05/26/2006.  Findings: No mass lesion, mass effect, midline shift, hydrocephalus, hemorrhage.  No acute territorial cortical ischemia/infarct. Atrophy and chronic ischemic white matter disease is present.  Paranasal sinuses appear normal.  Mastoid air cells are within normal limits.  Intracranial  atherosclerosis.  IMPRESSION: Mild atrophy and chronic ischemic white matter disease without acute intracranial abnormality.  Original Report Authenticated By: Andreas Newport, M.D.   Nm Myocar Multi W/spect W/wall Motion / Ef  09/30/2011  *RADIOLOGY REPORT*  Clinical Data:  Chest pain  MYOCARDIAL IMAGING WITH SPECT (REST AND PHARMACOLOGIC-STRESS) GATED LEFT VENTRICULAR WALL MOTION STUDY LEFT VENTRICULAR EJECTION FRACTION  Technique:  Standard myocardial SPECT imaging was performed after resting intravenous injection of 10 mCi Tc-24m tetrofosmin. Subsequently, intravenous infusion of Lexiscan was performed under the supervision of the Cardiology staff.  At peak effect of the drug, 30 mCi Tc-40m tetrofosmin was injected intravenously and standard myocardial SPECT  imaging was performed.  Quantitative gated imaging was also performed to evaluate left ventricular wall motion, and estimate left ventricular ejection fraction.  Comparison:  None.  Findings:  Spect:  No perfusion defects.  Wall motion:  Normal motion.  Ejection fraction:  61%.  End diastolic volume 93 ml.  End-systolic volume 37 ml.  IMPRESSION: No perfusion defects.  Original Report Authenticated By: Donavan Burnet, M.D.   Dg Chest Port 1 View  10/09/2011  *RADIOLOGY REPORT*  Clinical Data: Chest pain.  History of pericarditis  PORTABLE CHEST - 1 VIEW  Comparison: Chest radiograph 10/02/2011  Findings: Right PICC line with tip in distal SVC.  Normal cardiac silhouette.  No effusion, infiltrate, or pneumothorax.  IMPRESSION: No acute cardiopulmonary findings.  Original Report Authenticated By: Genevive Bi, M.D.   Dg Chest Port 1 View  10/02/2011  *RADIOLOGY REPORT*  Clinical Data: Dyspnea and weakness.  PORTABLE CHEST - 1 VIEW  Comparison: 09/28/2011  Findings: Mild cardiac enlargement with normal pulmonary vascularity.  Emphysematous changes with interstitial fibrosis suggested.  No blunting of costophrenic angles.  No focal airspace  consolidation.  No pneumothorax.  IMPRESSION: Stable cardiac enlargement.  No evidence of vascular congestion or edema.  No focal airspace disease.  Original Report Authenticated By: Marlon Pel, M.D.      MDM  Lab work reviewed mildly elevated troponin is on the way down from prior study Leukocytosis slightly more pronounced Spoke with [facility] hospitalist physician who will come to evaluate patient in the emergency department Diagnosis #1 chest pain #2 urinary retention #3 renal insufficiency #4 weakness      Doug Sou, MD 10/10/11 1610

## 2011-10-10 ENCOUNTER — Other Ambulatory Visit: Payer: Self-pay

## 2011-10-10 ENCOUNTER — Encounter (HOSPITAL_COMMUNITY): Payer: Self-pay | Admitting: Family Medicine

## 2011-10-10 DIAGNOSIS — Z1211 Encounter for screening for malignant neoplasm of colon: Secondary | ICD-10-CM

## 2011-10-10 DIAGNOSIS — R079 Chest pain, unspecified: Secondary | ICD-10-CM | POA: Diagnosis present

## 2011-10-10 DIAGNOSIS — I701 Atherosclerosis of renal artery: Secondary | ICD-10-CM | POA: Diagnosis not present

## 2011-10-10 DIAGNOSIS — A491 Streptococcal infection, unspecified site: Secondary | ICD-10-CM

## 2011-10-10 DIAGNOSIS — I330 Acute and subacute infective endocarditis: Secondary | ICD-10-CM

## 2011-10-10 DIAGNOSIS — K219 Gastro-esophageal reflux disease without esophagitis: Secondary | ICD-10-CM | POA: Diagnosis present

## 2011-10-10 DIAGNOSIS — E1165 Type 2 diabetes mellitus with hyperglycemia: Secondary | ICD-10-CM

## 2011-10-10 DIAGNOSIS — E1129 Type 2 diabetes mellitus with other diabetic kidney complication: Secondary | ICD-10-CM | POA: Diagnosis present

## 2011-10-10 DIAGNOSIS — Z9289 Personal history of other medical treatment: Secondary | ICD-10-CM

## 2011-10-10 DIAGNOSIS — N189 Chronic kidney disease, unspecified: Secondary | ICD-10-CM

## 2011-10-10 HISTORY — DX: Personal history of other medical treatment: Z92.89

## 2011-10-10 LAB — BASIC METABOLIC PANEL
Calcium: 9.3 mg/dL (ref 8.4–10.5)
GFR calc Af Amer: 41 mL/min — ABNORMAL LOW (ref >90)
GFR calc non Af Amer: 35 mL/min — ABNORMAL LOW (ref >90)
Glucose, Bld: 193 mg/dL — ABNORMAL HIGH (ref 70–99)
Potassium: 3.8 mEq/L (ref 3.5–5.1)
Sodium: 135 mEq/L (ref 135–145)

## 2011-10-10 LAB — LIPID PANEL
LDL Cholesterol: 153 mg/dL — ABNORMAL HIGH (ref 0–99)
Triglycerides: 219 mg/dL — ABNORMAL HIGH (ref <150)

## 2011-10-10 LAB — CBC
HCT: 33.9 % — ABNORMAL LOW (ref 36.0–46.0)
Hemoglobin: 11.5 g/dL — ABNORMAL LOW (ref 12.0–15.0)
MCHC: 33.9 g/dL (ref 30.0–36.0)
RBC: 3.74 MIL/uL — ABNORMAL LOW (ref 3.87–5.11)
WBC: 13.3 10*3/uL — ABNORMAL HIGH (ref 4.0–10.5)

## 2011-10-10 LAB — GLUCOSE, CAPILLARY
Glucose-Capillary: 79 mg/dL (ref 70–99)
Glucose-Capillary: 157 mg/dL — ABNORMAL HIGH (ref 70–99)
Glucose-Capillary: 202 mg/dL — ABNORMAL HIGH (ref 70–99)

## 2011-10-10 LAB — TROPONIN I
Troponin I: <0.3 ng/mL (ref <0.30)
Troponin I: <0.3 ng/mL (ref <0.30)

## 2011-10-10 MED ORDER — DEXTROSE 5 % IV SOLN
2.0000 g | INTRAVENOUS | Status: DC
  Administered 2011-10-10 – 2011-10-13 (×4): 2 g via INTRAVENOUS
  Filled 2011-10-10 (×4): qty 2

## 2011-10-10 MED ORDER — ESCITALOPRAM OXALATE 20 MG PO TABS
20.0000 mg | ORAL_TABLET | Freq: Every day | ORAL | Status: DC
  Administered 2011-10-10 – 2011-10-13 (×4): 20 mg via ORAL
  Filled 2011-10-10 (×4): qty 1

## 2011-10-10 MED ORDER — PANTOPRAZOLE SODIUM 40 MG PO TBEC
40.0000 mg | DELAYED_RELEASE_TABLET | Freq: Every day | ORAL | Status: DC
  Administered 2011-10-10: 40 mg via ORAL
  Filled 2011-10-10: qty 1

## 2011-10-10 MED ORDER — ISOSORBIDE MONONITRATE ER 60 MG PO TB24
60.0000 mg | ORAL_TABLET | Freq: Every day | ORAL | Status: DC
  Administered 2011-10-10 – 2011-10-13 (×4): 60 mg via ORAL
  Filled 2011-10-10 (×4): qty 1

## 2011-10-10 MED ORDER — PREDNISONE 20 MG PO TABS
20.0000 mg | ORAL_TABLET | Freq: Every day | ORAL | Status: DC
  Administered 2011-10-10 – 2011-10-13 (×4): 20 mg via ORAL
  Filled 2011-10-10 (×5): qty 1

## 2011-10-10 MED ORDER — ACETAMINOPHEN 325 MG PO TABS
650.0000 mg | ORAL_TABLET | Freq: Four times a day (QID) | ORAL | Status: DC | PRN
  Administered 2011-10-10: 650 mg via ORAL
  Filled 2011-10-10: qty 2

## 2011-10-10 MED ORDER — SPIRONOLACTONE 25 MG PO TABS
25.0000 mg | ORAL_TABLET | Freq: Every day | ORAL | Status: DC
  Administered 2011-10-10 – 2011-10-13 (×4): 25 mg via ORAL
  Filled 2011-10-10 (×4): qty 1

## 2011-10-10 MED ORDER — ONDANSETRON HCL 4 MG/2ML IJ SOLN: 4.0000 mg | Freq: Four times a day (QID) | INTRAMUSCULAR | Status: DC | PRN

## 2011-10-10 MED ORDER — ASPIRIN 325 MG PO TABS
325.0000 mg | ORAL_TABLET | Freq: Every day | ORAL | Status: DC
  Administered 2011-10-10 – 2011-10-13 (×4): 325 mg via ORAL
  Filled 2011-10-10 (×4): qty 1

## 2011-10-10 MED ORDER — HYDROCODONE-ACETAMINOPHEN 5-325 MG PO TABS
1.0000 | ORAL_TABLET | ORAL | Status: DC | PRN
  Filled 2011-10-10: qty 2

## 2011-10-10 MED ORDER — INSULIN GLARGINE 100 UNIT/ML ~~LOC~~ SOLN
62.0000 [IU] | Freq: Every day | SUBCUTANEOUS | Status: DC
  Administered 2011-10-11: 62 [IU] via SUBCUTANEOUS

## 2011-10-10 MED ORDER — LEVOTHYROXINE SODIUM 112 MCG PO TABS
112.0000 ug | ORAL_TABLET | Freq: Every day | ORAL | Status: DC
  Administered 2011-10-10 – 2011-10-13 (×4): 112 ug via ORAL
  Filled 2011-10-10 (×5): qty 1

## 2011-10-10 MED ORDER — INSULIN ASPART 100 UNIT/ML ~~LOC~~ SOLN
0.0000 [IU] | Freq: Every day | SUBCUTANEOUS | Status: DC
  Administered 2011-10-10: 2 [IU] via SUBCUTANEOUS

## 2011-10-10 MED ORDER — SODIUM CHLORIDE 0.9 % IJ SOLN
10.0000 mL | Freq: Two times a day (BID) | INTRAMUSCULAR | Status: DC
  Administered 2011-10-12 – 2011-10-13 (×2): 10 mL

## 2011-10-10 MED ORDER — CLONIDINE HCL 0.1 MG/24HR TD PTWK
0.1000 mg | MEDICATED_PATCH | TRANSDERMAL | Status: DC
  Administered 2011-10-10: 0.1 mg via TRANSDERMAL
  Filled 2011-10-10: qty 1

## 2011-10-10 MED ORDER — ALPRAZOLAM 0.5 MG PO TABS: 0.5000 mg | ORAL_TABLET | Freq: Every evening | ORAL | Status: DC | PRN

## 2011-10-10 MED ORDER — SODIUM CHLORIDE 0.9 % IJ SOLN
3.0000 mL | Freq: Two times a day (BID) | INTRAMUSCULAR | Status: DC
  Administered 2011-10-11 – 2011-10-13 (×2): 3 mL via INTRAVENOUS

## 2011-10-10 MED ORDER — INSULIN ASPART 100 UNIT/ML ~~LOC~~ SOLN
0.0000 [IU] | Freq: Three times a day (TID) | SUBCUTANEOUS | Status: DC
  Administered 2011-10-10: 3 [IU] via SUBCUTANEOUS
  Administered 2011-10-10 (×2): 8 [IU] via SUBCUTANEOUS
  Administered 2011-10-11: 3 [IU] via SUBCUTANEOUS
  Administered 2011-10-11: 11 [IU] via SUBCUTANEOUS
  Administered 2011-10-11: 8 [IU] via SUBCUTANEOUS
  Administered 2011-10-12: 5 [IU] via SUBCUTANEOUS
  Administered 2011-10-13: 3 [IU] via SUBCUTANEOUS
  Administered 2011-10-13: 2 [IU] via SUBCUTANEOUS

## 2011-10-10 MED ORDER — ONDANSETRON HCL 4 MG PO TABS: 4.0000 mg | ORAL_TABLET | Freq: Four times a day (QID) | ORAL | Status: DC | PRN

## 2011-10-10 MED ORDER — NEBIVOLOL HCL 2.5 MG PO TABS
2.5000 mg | ORAL_TABLET | Freq: Every day | ORAL | Status: DC
  Administered 2011-10-10 – 2011-10-13 (×4): 2.5 mg via ORAL
  Filled 2011-10-10 (×4): qty 1

## 2011-10-10 MED ORDER — OMEGA-3-ACID ETHYL ESTERS 1 G PO CAPS
2.0000 g | ORAL_CAPSULE | Freq: Two times a day (BID) | ORAL | Status: DC
  Administered 2011-10-10 – 2011-10-12 (×6): 2 g via ORAL
  Administered 2011-10-13: 1 g via ORAL
  Filled 2011-10-10 (×8): qty 2

## 2011-10-10 MED ORDER — SODIUM CHLORIDE 0.9 % IJ SOLN
10.0000 mL | INTRAMUSCULAR | Status: DC | PRN
  Administered 2011-10-10 – 2011-10-12 (×3): 10 mL

## 2011-10-10 MED ORDER — SODIUM CHLORIDE 0.9 % IV SOLN
INTRAVENOUS | Status: DC
  Administered 2011-10-10 (×2): via INTRAVENOUS

## 2011-10-10 MED ORDER — HEPARIN SODIUM (PORCINE) 5000 UNIT/ML IJ SOLN
5000.0000 [IU] | Freq: Three times a day (TID) | INTRAMUSCULAR | Status: AC
  Administered 2011-10-10 – 2011-10-11 (×5): 5000 [IU] via SUBCUTANEOUS
  Filled 2011-10-10 (×6): qty 1

## 2011-10-10 MED ORDER — AMLODIPINE BESYLATE 10 MG PO TABS
10.0000 mg | ORAL_TABLET | Freq: Every day | ORAL | Status: DC
  Administered 2011-10-10 – 2011-10-13 (×4): 10 mg via ORAL
  Filled 2011-10-10 (×4): qty 1

## 2011-10-10 MED ORDER — NITROGLYCERIN 2 % TD OINT
0.5000 [in_us] | TOPICAL_OINTMENT | Freq: Four times a day (QID) | TRANSDERMAL | Status: DC
  Administered 2011-10-10 – 2011-10-13 (×12): 0.5 [in_us] via TOPICAL
  Filled 2011-10-10: qty 30

## 2011-10-10 MED ORDER — POTASSIUM CHLORIDE CRYS ER 20 MEQ PO TBCR
20.0000 meq | EXTENDED_RELEASE_TABLET | Freq: Two times a day (BID) | ORAL | Status: DC
  Administered 2011-10-10 – 2011-10-13 (×7): 20 meq via ORAL
  Filled 2011-10-10 (×8): qty 1

## 2011-10-10 MED ORDER — FUROSEMIDE 40 MG PO TABS
40.0000 mg | ORAL_TABLET | Freq: Two times a day (BID) | ORAL | Status: DC
  Administered 2011-10-10 – 2011-10-13 (×6): 40 mg via ORAL
  Filled 2011-10-10 (×8): qty 1

## 2011-10-10 MED ORDER — HYDRALAZINE HCL 50 MG PO TABS
50.0000 mg | ORAL_TABLET | Freq: Four times a day (QID) | ORAL | Status: DC
  Administered 2011-10-10 – 2011-10-13 (×13): 50 mg via ORAL
  Filled 2011-10-10 (×17): qty 1

## 2011-10-10 MED ORDER — GI COCKTAIL ~~LOC~~
30.0000 mL | Freq: Once | ORAL | Status: AC
  Administered 2011-10-10: 30 mL via ORAL
  Filled 2011-10-10: qty 30

## 2011-10-10 MED ORDER — ACETAMINOPHEN 650 MG RE SUPP: 650.0000 mg | Freq: Four times a day (QID) | RECTAL | Status: DC | PRN

## 2011-10-10 MED ORDER — PANTOPRAZOLE SODIUM 40 MG PO TBEC
80.0000 mg | DELAYED_RELEASE_TABLET | Freq: Every day | ORAL | Status: DC
  Administered 2011-10-11 – 2011-10-12 (×2): 80 mg via ORAL
  Filled 2011-10-10: qty 1
  Filled 2011-10-10: qty 2
  Filled 2011-10-10: qty 1

## 2011-10-10 MED ORDER — INSULIN GLARGINE 100 UNIT/ML ~~LOC~~ SOLN: 62.0000 [IU] | Freq: Every day | SUBCUTANEOUS | Status: DC

## 2011-10-10 NOTE — ED Notes (Signed)
Admitting at bedside 

## 2011-10-10 NOTE — Progress Notes (Signed)
UR Completed. Simmons, Chestina Komatsu F [phone]  

## 2011-10-10 NOTE — Progress Notes (Signed)
Inpatient Diabetes Program Recommendations  AACE/ADA: New Consensus Statement on Inpatient Glycemic Control (2009)  Target Ranges:  Prepandial:   less than 140 mg/dL      Peak postprandial:   less than 180 mg/dL (1-2 hours)      Critically ill patients:  140 - 180 mg/dL   Reason for Visit: Hyperglycemia  Inpatient Diabetes Program Recommendations Insulin - Meal Coverage: At home patient takes 10 units meal coverage at breakfast & lunch and 20 units meal coverage at supper. HgbA1C: Last known Hgb A1c was in 2010. Request updated Hgb A1c.  Note: CBG before breakfast was 157 mg/dl.  By lunch, CBG was 256 mg/dl.  Please consider adding 6 units meal coverage tid with meals (to be held if NPO or eats < 50%) in addition to correction scale.  Thank you. [name] S. Elsie Lincoln, RN, CNS, CDE  [phone])

## 2011-10-10 NOTE — Progress Notes (Signed)
Patient called complaining of CP. Described it as upper chest pressure 5/10. Patient has nitro paste on her left chest. Gave 2 Tylenol, VS - 97.8 Temp, 62 HR, 127/64 BP, 98% Ox saturation on RA. EKG done showed NSR unconfirmed. Text paged Dr. Blake Divine and she stated that she would be up shortly. Lajuana Matte, RN

## 2011-10-10 NOTE — Consult Note (Signed)
Reason for Consult: chest pain Referring Physician:   [name] is an 66 y.o. female.  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  During the hospitalization to negative nuclear stress test ejection fraction of 61% and normal wall motion. She also had a left heart catheterization by Dr. Gery Pray on 01/28/2011 revealed normal coronary arteries. Her history also includes hypertension, peripheral vascular disease with renal artery stenosis as well as subclavian arterial stenosis, diastolic dysfunction, pulmonary hypertension,diabetes mellitus, hyperlipidemia, chronic kidney disease, bilateral cataracts, obstructive sleep apnea (CPAP), hypothyroidism.  The patient states she was receiving her on IV antibiotics at home when she developed substernal chest pain, diaphoresis and increasing blurry vision. She states she felt "heavy in the chest" and rated the intensity of 5/10. Her daughter called EMS and they administered nitroglycerin which apparently eased the chest pain.  Patient also reports she did have a lot of indigestion and belching lately.  EKG shows a rate of 66 beats per minute, LDH with ST changes in V3 through 6 which could be repolarization abnormality.  Troponin POC  0.15 which was decreased from prior troponin and a regular serum troponin was less than 0.30.    Past Medical History  Diagnosis Date  . HTN (hypertension)   . Elevated troponin level 09/28/11    Type II MI - not ACS related  . Diastolic dysfunction, left ventricle     with previous Diastolic HF  . Diabetes mellitus   . High cholesterol   . Kidney disease   . Cataracts, bilateral   . 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 Obstructive sleep apnea on nocturnal CPAP 06/20/2008    Qualifier: Diagnosis of  By: Vernie Murders    . Pulmonary HTN-severe- due to diastolic dysfunction 09/29/2011    Moderate to Severe  Pulm HTN.  Marland Kitchen Acute renal failure 09/29/2011  . 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 HYPOTHYROIDISM 06/19/2008    Qualifier: Diagnosis of  By: Excell Seltzer CMA, Lawson Fiscal    . Renal artery stenosis     s/p stents (restenosis)  . Subclavian arterial stenosis   . Peripheral neuropathy     Past Surgical History  Procedure Date  . Cardiac catheterization 01/2011    Minimal luminal irregularties  . Laparoscopic tubal ligation   . Renal artery stent     x 2 (restenosis)    Family History  Problem Relation Age of Onset  . Heart disease Mother   . Heart disease Father   . Heart disease Sister   . Heart disease Daughter     Social History:  reports that she quit smoking about 7 years ago. Her smoking use included Cigarettes. She has a 25 pack-year smoking history. She does not have any smokeless tobacco history on file. She reports that she does not drink alcohol or use illicit drugs.  Allergies:  Allergies  Allergen Reactions  . Atenolol     unknown  . Bystolic (Nebivolol Hcl)     Cannot tolerate greater then 5 mg, causes chest pain  . Ciprofloxacin     unknown  . Codeine     swelling  . Lidocaine     Throat swelling  . Metformin     vomiting  . Metoprolol     unknown  . Penicillins     rash    Medications:    . amLODipine  10 mg  Oral Daily  . aspirin  325 mg Oral Daily  . cefTRIAXone (ROCEPHIN) IVPB 2 gram/50 mL D5W (Pyxis)  2 g Intravenous Q24H  . cloNIDine  0.1 mg Transdermal Weekly  . escitalopram  20 mg Oral Daily  . gi cocktail  30 mL Oral Once  . heparin  5,000 Units Subcutaneous Q8H  . hydrALAZINE  50 mg Oral Q6H  . insulin aspart  0-15 Units Subcutaneous TID WC  . insulin aspart  0-5 Units Subcutaneous QHS  . insulin glargine  62 Units Subcutaneous QHS  . isosorbide mononitrate  60 mg Oral Daily  . levothyroxine  112 mcg Oral Q breakfast  . nebivolol  2.5 mg Oral Daily  . nitroGLYCERIN  0.5 inch Topical Q6H  .  omega-3 acid ethyl esters  2 g Oral BID  . pantoprazole  80 mg Oral Daily  . potassium chloride SA  20 mEq Oral BID  . predniSONE  20 mg Oral Q breakfast  . sodium chloride  3 mL Intravenous Q12H  . DISCONTD: insulin glargine  62 Units Subcutaneous Daily  . DISCONTD: pantoprazole  40 mg Oral Daily     Results for orders placed during the hospital encounter of 10/09/11 (from the past 48 hour(s))  URINALYSIS, ROUTINE W REFLEX MICROSCOPIC     Status: Normal   Collection Time   10/09/11 10:54 PM      Component Value Range Comment   Color, Urine YELLOW  YELLOW     APPearance CLEAR  CLEAR     Specific Gravity, Urine 1.009  1.005 - 1.030     pH 6.0  5.0 - 8.0     Glucose, UA NEGATIVE  NEGATIVE (mg/dL)    Hgb urine dipstick NEGATIVE  NEGATIVE     Bilirubin Urine NEGATIVE  NEGATIVE     Ketones, ur NEGATIVE  NEGATIVE (mg/dL)    Protein, ur NEGATIVE  NEGATIVE (mg/dL)    Urobilinogen, UA 0.2  0.0 - 1.0 (mg/dL)    Nitrite NEGATIVE  NEGATIVE     Leukocytes, UA NEGATIVE  NEGATIVE  MICROSCOPIC NOT DONE ON URINES WITH NEGATIVE PROTEIN, BLOOD, LEUKOCYTES, NITRITE, OR GLUCOSE <1000 mg/dL.  GLUCOSE, CAPILLARY     Status: Normal   Collection Time   10/09/11 11:05 PM      Component Value Range Comment   Glucose-Capillary 79  70 - 99 (mg/dL)   CBC     Status: Abnormal   Collection Time   10/09/11 11:20 PM      Component Value Range Comment   WBC 18.0 (*) 4.0 - 10.5 (K/uL)    RBC 3.93  3.87 - 5.11 (MIL/uL)    Hemoglobin 11.9 (*) 12.0 - 15.0 (g/dL)    HCT 16.1 (*) 09.6 - 46.0 (%)    MCV 91.1  78.0 - 100.0 (fL)    MCH 30.3  26.0 - 34.0 (pg)    MCHC 33.2  30.0 - 36.0 (g/dL)    RDW 04.5  40.9 - 81.1 (%)    Platelets 375  150 - 400 (K/uL)   DIFFERENTIAL     Status: Abnormal   Collection Time   10/09/11 11:20 PM      Component Value Range Comment   Neutrophils Relative 76  43 - 77 (%)    Neutro Abs 13.6 (*) 1.7 - 7.7 (K/uL)    Lymphocytes Relative 17  12 - 46 (%)    Lymphs Abs 3.0  0.7 - 4.0 (K/uL)     Monocytes Relative 7  3 - 12 (%)    Monocytes Absolute 1.2 (*) 0.1 - 1.0 (K/uL)    Eosinophils Relative 1  0 - 5 (%)    Eosinophils Absolute 0.1  0.0 - 0.7 (K/uL)    Basophils Relative 1  0 - 1 (%)    Basophils Absolute 0.1  0.0 - 0.1 (K/uL)   POCT I-STAT TROPONIN I     Status: Abnormal   Collection Time   10/09/11 11:25 PM      Component Value Range Comment   Troponin i, poc 0.15 (*) 0.00 - 0.08 (ng/mL)    Comment NOTIFIED PHYSICIAN      Comment 3            POCT I-STAT, CHEM 8     Status: Abnormal   Collection Time   10/09/11 11:28 PM      Component Value Range Comment   Sodium 138  135 - 145 (mEq/L)    Potassium 3.9  3.5 - 5.1 (mEq/L)    Chloride 107  96 - 112 (mEq/L)    BUN 66 (*) 6 - 23 (mg/dL)    Creatinine, Ser 1.61 (*) 0.50 - 1.10 (mg/dL)    Glucose, Bld 78  70 - 99 (mg/dL)    Calcium, Ion 0.96  1.12 - 1.32 (mmol/L)    TCO2 23  0 - 100 (mmol/L)    Hemoglobin 12.2  12.0 - 15.0 (g/dL)    HCT 04.5  40.9 - 81.1 (%)   LIPID PANEL     Status: Abnormal   Collection Time   10/10/11  5:00 AM      Component Value Range Comment   Cholesterol 235 (*) 0 - 200 (mg/dL)    Triglycerides 914 (*) <150 (mg/dL)    HDL 38 (*) >78 (mg/dL)    Total CHOL/HDL Ratio 6.2      VLDL 44 (*) 0 - 40 (mg/dL)    LDL Cholesterol 295 (*) 0 - 99 (mg/dL)   CBC     Status: Abnormal   Collection Time   10/10/11  5:00 AM      Component Value Range Comment   WBC 13.3 (*) 4.0 - 10.5 (K/uL)    RBC 3.74 (*) 3.87 - 5.11 (MIL/uL)    Hemoglobin 11.5 (*) 12.0 - 15.0 (g/dL)    HCT 62.1 (*) 30.8 - 46.0 (%)    MCV 90.6  78.0 - 100.0 (fL)    MCH 30.7  26.0 - 34.0 (pg)    MCHC 33.9  30.0 - 36.0 (g/dL)    RDW 65.7  84.6 - 96.2 (%)    Platelets 353  150 - 400 (K/uL)   BASIC METABOLIC PANEL     Status: Abnormal   Collection Time   10/10/11  5:00 AM      Component Value Range Comment   Sodium 135  135 - 145 (mEq/L)    Potassium 3.8  3.5 - 5.1 (mEq/L)    Chloride 100  96 - 112 (mEq/L)    CO2 22  19 - 32 (mEq/L)     Glucose, Bld 193 (*) 70 - 99 (mg/dL)    BUN 68 (*) 6 - 23 (mg/dL)    Creatinine, Ser 9.52 (*) 0.50 - 1.10 (mg/dL)    Calcium 9.3  8.4 - 10.5 (mg/dL)    GFR calc non Af Amer 35 (*) >90 (mL/min)    GFR calc Af Amer 41 (*) >90 (mL/min)   GLUCOSE, CAPILLARY     Status: Abnormal  Collection Time   10/10/11  6:11 AM      Component Value Range Comment   Glucose-Capillary 157 (*) 70 - 99 (mg/dL)   TROPONIN I     Status: Normal   Collection Time   10/10/11  7:35 AM      Component Value Range Comment   Troponin I <0.30  <0.30 (ng/mL)   GLUCOSE, CAPILLARY     Status: Abnormal   Collection Time   10/10/11 11:56 AM      Component Value Range Comment   Glucose-Capillary 256 (*) 70 - 99 (mg/dL)     Dg Chest Port 1 View  10/09/2011  *RADIOLOGY REPORT*  Clinical Data: Chest pain.  History of pericarditis  PORTABLE CHEST - 1 VIEW  Comparison: Chest radiograph 10/02/2011  Findings: Right PICC line with tip in distal SVC.  Normal cardiac silhouette.  No effusion, infiltrate, or pneumothorax.  IMPRESSION: No acute cardiopulmonary findings.  Original Report Authenticated By: Genevive Bi, M.D.    Review of Systems  Constitutional: Positive for diaphoresis. Negative for fever.  HENT: Negative for congestion and sore throat.   Eyes: Positive for blurred vision.  Respiratory: Negative for cough, shortness of breath and wheezing.   Cardiovascular: Positive for chest pain (no radiation to arm neck back or jaw) and palpitations. Negative for orthopnea, leg swelling and PND.  Gastrointestinal: Positive for heartburn. Negative for nausea, vomiting, abdominal pain, diarrhea, constipation, blood in stool and melena.  Genitourinary: Negative for dysuria and hematuria.  Neurological: Positive for dizziness (upon standing).   Blood pressure 127/64, pulse 62, temperature 97.8 F (36.6 C), temperature source Oral, resp. rate 16, height 5' 2.99" (1.6 m), weight 80.2 kg (176 lb 12.9 oz), SpO2 98.00%. Physical  Exam  Constitutional: She is oriented to person, place, and time. No distress.       Deconditioned obese female  HENT:  Head: Normocephalic and atraumatic.  Eyes: EOM are normal. Pupils are equal, round, and reactive to light. No scleral icterus.  Neck: Normal range of motion. Neck supple. No JVD present.  Cardiovascular: Normal rate and regular rhythm.   Murmur heard.  Systolic murmur is present with a grade of 2/6  Pulses:      Carotid pulses are on the right side with bruit.      Radial pulses are 2+ on the right side, and 2+ on the left side.       Dorsalis pedis pulses are 2+ on the right side, and 2+ on the left side.       Positive right carotid bruit likely accentuated by systolic murmur.  Respiratory: Effort normal and breath sounds normal. She has no wheezes. She has no rales.  GI: Soft. Bowel sounds are normal. There is tenderness (mild epigastric tenderness).  Musculoskeletal: She exhibits no edema.       No lower extremity edema  Lymphadenopathy:    She has no cervical adenopathy.  Neurological: She is alert and oriented to person, place, and time.  Skin: Skin is warm and dry.  Psychiatric: She has a normal mood and affect.   2D ECHO for recent admission Left ventricle: The cavity size was normal. Wall thickness was increased in a pattern of moderate LVH. Systolic function was normal. The estimated ejection fraction was in the range of 55% to 60%. Wall motion was normal; there were no regional wall motion abnormalities. Features are consistent with a pseudonormal left ventricular filling pattern, with concomitant abnormal relaxation and increased filling pressure (grade 2 diastolic  dysfunction). Doppler parameters are consistent with elevated mean left atrial filling pressure. - Aortic valve: A hypermobile echogenicity is seen in the LV outflow tract and is probably attached to the aortic valve. In the right clinical context this could represent a vegetation.  Trivial regurgitation. - Mitral valve: A hypermobile echogenicity is seen on the anterior mitral valve. In the right clinical context this could represent a vegetation. Calcified annulus. Valve area by pressure half-time: 2.34cm^2. - Left atrium: The atrium was mildly dilated. - Right atrium: The atrium was mildly dilated. - Pulmonary arteries: Systolic pressure was moderately to severely increased. PA peak pressure: 63mm Hg (S). - Pericardium, extracardiac: A trivial pericardial effusion was identified. Impressions:  - Unusual filamentous mobile echogenicities are seen on the mitral andaortic valve, without associated valvular hemodynamically significant abnormalities. Consider Libman-Sacks endocarditis. Severe diastolic dysfunction and moderate to severe pulmonary hypertension are similar to previous studies. A transesophageal echo will offer better visualization of the valvular abnormalities, but may not clarify the diagnosis. If infective endocarditis is suspected, a TEE is indicated.  PORTABLE CHEST - 1 VIEW  Comparison: Chest radiograph 10/02/2011  Findings: Right PICC line with tip in distal SVC. Normal cardiac  silhouette. No effusion, infiltrate, or pneumothorax.  IMPRESSION:  No acute cardiopulmonary findings.   Assessment/Plan: Patient Active Hospital Problem List: Chest pain (10/10/2011) HYPOTHYROIDISM (06/19/2008) HYPERLIPIDEMIA (06/19/2008) HYPERTENSION (06/19/2008) HYPERTENSIVE CARDIOVASCULAR DISEASE (06/19/2008) COPD UNSPECIFIED (07/22/2008) VERTIGO (06/19/2008) Obstructive sleep apnea on nocturnal CPAP (06/20/2008) Streptococcal endocarditis (10/01/2011) Diabetes mellitus (10/10/2011) RAS (renal artery stenosis) (10/10/2011)   Plan:  Chest pain symptoms are not likely cardiac in nature. Patient had a negative nuclear stress test on May 10; normal coronary arteries by cath in September 2012 and apparently at [facility].  Point of Care troponin was positive at 0.15 however  subsequent serum troponin was less than 0.30.  It could be CP is related to grade II diastolic dysfunction however, the patient has not symptoms of heart failure.  A GI cocktail has been ordered.  Let's see if she improves.  She also is wearing nitro paste and still complaining of pain.  A new Clonidine patch was applied yesterday and should NOT be reapplied at the end of the seven days(She is being tapered off).  Titrate hydralazine for BP.   HAGER, BRYAN 10/10/2011, 2:07 PM   ATTENDING ATTESTATION:  I have seen and examined the patient along with Wilburt Finlay, PA.  I have reviewed the chart, notes and new data.  I agree with Bryan's note.  Brief Description: 66 y/o woman with extensive cardiac evaluation over the past 8 months - non-obstructive / minimal CAD with Chronic Grade 2 diastolic dysfunction / Diastolic HF and elevated PA pressures (likel 2nd-ary to LV Diastoloic Dysfunction) recently d/c'd on IV Abx for Strep Bovis Endocarditis - had episode of CP with Abx infusion @ home.  Still notes orthopea & PND without edema.    Her SSx & ECG are interesting given her recent negative Myoview & Cath -- ECG most likely c/w LVH & repol / strain abnormalities.  (Current ECG is quite different than prior ECGs - will check another).  She appears to have been hypertensive on arrival -- Sx relieved with NTG - due to decreased after & pre-load -- net out ~1344ml overnight with improvement of symptoms.  She may well have subendocardial ischemia from hypertensive heart disease.  I doubt that her CP was related to the SBE -- no gross worsening of her murmurs & no overt CHF.  Key examination changes: Aortic outflow 1-2/6 murmur radiating to carotids with B carotid bruits as well as Holosystolic TR 2/6 murmur.  NO significant JVP & no rales.  Key new findings / data: Troponin normal now.  Had elevation from last admission.  PLAN:  Increase afterload reduction with increased Hydralazine (ACE-I/ARB not being  used due to recent renal failure exaccerbation - would probably benefit from re-initiation, but can be done as OP)  Will add Spironolactone for diuretic & HF effect -- was in the plan from her [facility] evaluation in Feb 2013.  Would also consider standing Lasix dose as she still has orthopnea.  Monitor overnight -- would not recommend further ischemic work-up for now, will defer to Dr. Hazle Coca judgement (he is in the Hospital tomorrow) as her Primary Cardiologist on this.  With renal function just now improving, would likely regret contrast load & would be reticent to perform invasive intra-vscular procedures until Endocarditis is fully treated.     Marykay Lex, M.D., M.S. [facility] [address] [place], [place]  [address]  [phone]  10/10/2011 2:55 PM

## 2011-10-10 NOTE — ED Notes (Signed)
Dr. Jacobawitz at bedside. 

## 2011-10-10 NOTE — ED Notes (Signed)
Followed up with bed control regarding pts bed. Will continue to monitor.

## 2011-10-10 NOTE — Consult Note (Signed)
I have taken a history, examined the patient and reviewed the chart. I agree with the extender's note, impression and recommendations. Cause of chest pain is not clear-could be GERD or esophagitis. Plan for colonoscopy and EGD as inpatient.   Meryl Dare MD [facility]

## 2011-10-10 NOTE — H&P (Signed)
PCP:   Alice Reichert, MD, MD  Cardiologist: Dr. Allyson Sabal  Chief Complaint:  Chest pains  HPI: This is a 66 year old female with recent diagnosis of strep endocarditis. She's on Rocephin 2 g IV daily at home. Tonight while her daughter was administrating her antibiotics she developed chest pains centrally located. She does states pain is 10 out of 10, described as dull and heavy. Danielle Dess was also constantn.She denies any nausea, vomiting or diaphoresis. However,  she does describe palpitations described as pounding. Patient has no history of MI however she does have cardiomyopathy from uncontrolled hypertension. She has a history of renal artery stenosis which has been stented. She severe  pulmonary hypertension. During her recent hospital admission she had some mildly elevated troponins, she had a normal stress test during this hospitalization. Patient states she did left heart cath approximately a year ago for similar chest pains, however, she is unclear she was stented or not or of the specific findings. IEnroute to the hospital she received nitroglycerin which she states  chest and resolved her chest pain which is now 0-10.   Review of Systems: Positives bolded anorexia, fever, weight loss,, vision loss, decreased hearing, hoarseness, chest pain, syncope, dyspnea on exertion, peripheral edema, balance deficits, hemoptysis, abdominal pain, melena, hematochezia, severe indigestion/heartburn, hematuria, incontinence, genital sores, muscle weakness, suspicious skin lesions, transient blindness, difficulty walking, depression, unusual weight change, abnormal bleeding, enlarged lymph nodes, angioedema, and breast masses.  Past Medical History: Past Medical History  Diagnosis Date  . HTN (hypertension)   . Heart attack   . CHF (congestive heart failure)   . Diabetes mellitus   . High cholesterol   . Kidney disease   . Cataracts, bilateral   . 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 Obstructive sleep apnea on nocturnal CPAP 06/20/2008    Qualifier: Diagnosis of  By: Vernie Murders    . Pulmonary HTN-severe- due to diastolic dysfunction 09/29/2011  . Acute renal failure 09/29/2011  . 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 HYPOTHYROIDISM 06/19/2008    Qualifier: Diagnosis of  By: Excell Seltzer CMA, Lawson Fiscal    . Renal artery stenosis     s/p stents (restenosis)  . Subclavian arterial stenosis   . Peripheral neuropathy    Past Surgical History  Procedure Date  . Cardiac catheterization   . Laparoscopic tubal ligation   . Renal artery stent     x 2 (restenosis)    Medications: Prior to Admission medications   Medication Sig Start Date End Date Taking? Authorizing Provider  ALPRAZolam Prudy Feeler) 0.5 MG tablet Take 0.5 mg by mouth at bedtime as needed. For anxiety   Yes Historical Provider, MD  amLODipine (NORVASC) 10 MG tablet Take 1 tablet (10 mg total) by mouth daily. 10/06/11 10/05/12 Yes Adeline Joselyn Glassman, MD  aspirin 81 MG tablet Take 81 mg by mouth daily.     Yes Historical Provider, MD  Cholecalciferol (VITAMIN D3) 2000 UNITS TABS Take 1 tablet by mouth at bedtime.     Yes Historical Provider, MD  cloNIDine (CATAPRES - DOSED IN MG/24 HR) 0.1 mg/24hr patch Place 1 patch (0.1 mg total) onto the skin once a week. 10/06/11 10/05/12 Yes Adeline C Viyuoh, MD  dextrose 5 % SOLN 50 mL with cefTRIAXone 2 G SOLR 2 g Inject 2 g into the vein daily. 10/06/11 11/02/11 Yes Adeline C Viyuoh, MD  escitalopram (LEXAPRO) 20 MG tablet Take 20 mg by mouth daily.  Yes Historical Provider, MD  esomeprazole (NEXIUM) 40 MG capsule Take 40 mg by mouth daily as needed. For reflux   Yes Historical Provider, MD  furosemide (LASIX) 40 MG tablet Take 1 tablet (40 mg total) by mouth 2 (two) times daily. 10/06/11 10/05/12 Yes Adeline Joselyn Glassman, MD  hydrALAZINE (APRESOLINE) 50 MG tablet Take 1 tablet (50 mg total) by mouth every 6 (six) hours. 10/06/11  10/05/12 Yes Adeline C Viyuoh, MD  insulin glargine (LANTUS) 100 UNIT/ML injection Inject 62 Units into the skin daily.    Yes Historical Provider, MD  insulin lispro (HUMALOG) 100 UNIT/ML injection Inject 10-20 Units into the skin 3 (three) times daily with meals. 10 units with breakfast, 10 units with lunch, 20 units with dinner   Yes Historical Provider, MD  isosorbide mononitrate (IMDUR) 60 MG 24 hr tablet Take 60 mg by mouth daily.     Yes Historical Provider, MD  levothyroxine (SYNTHROID, LEVOTHROID) 112 MCG tablet Take 112 mcg by mouth daily.     Yes Historical Provider, MD  nebivolol (BYSTOLIC) 2.5 MG tablet Take 2.5 mg by mouth daily.     Yes Historical Provider, MD  omega-3 acid ethyl esters (LOVAZA) 1 G capsule Take 2 g by mouth 2 (two) times daily.   Yes Historical Provider, MD  potassium chloride SA (K-DUR,KLOR-CON) 20 MEQ tablet Take 20 mEq by mouth 2 (two) times daily.     Yes Historical Provider, MD  predniSONE (DELTASONE) 20 MG tablet Take 1 tablet (20 mg total) by mouth daily with breakfast. 10/06/11 10/16/11 Yes Adeline Joselyn Glassman, MD    Allergies:   Allergies  Allergen Reactions  . Atenolol     unknown  . Bystolic (Nebivolol Hcl)     Cannot tolerate greater then 5 mg, causes chest pain  . Ciprofloxacin     unknown  . Codeine     swelling  . Lidocaine     Throat swelling  . Metformin     vomiting  . Metoprolol     unknown  . Penicillins     rash    Social History:  reports that she quit smoking about 7 years ago. Her smoking use included Cigarettes. She has a 25 pack-year smoking history. She does not have any smokeless tobacco history on file. She reports that she does not drink alcohol or use illicit drugs.  Family History: Family History  Problem Relation Age of Onset  . Heart disease Mother   . Heart disease Father   . Heart disease Sister   . Heart disease Daughter     Physical Exam: Filed Vitals:   10/10/11 0100 10/10/11 0115 10/10/11 0130 10/10/11  0145  BP: 149/38 151/35 152/36 154/38  Pulse: 52 52 52 52  Temp:      TempSrc:      Resp: 15 17 16 18   SpO2: 100% 99% 99% 98%    General:  Alert and oriented times three, well developed and nourished, no acute distress Eyes: PERRLA, pink conjunctiva, no scleral icterus ENT: Moist oral mucosa, neck supple, no thyromegaly Lungs: clear to ascultation, no wheeze, no crackles, no use of accessory muscles Cardiovascular: regular rate and rhythm, no regurgitation, no gallops, no murmurs. No carotid bruits, no JVD Abdomen: soft, positive BS, non-tender, non-distended, no organomegaly, not an acute abdomen GU: not examined Neuro: CN II - XII grossly intact, sensation intact Musculoskeletal: strength 5/5 all extremities, no clubbing, cyanosis or edema, PICC line right arm Skin: no rash, no subcutaneous crepitation,  no decubitus Psych: appropriate patient   Labs on Admission:   Basename 10/09/11 2328  NA 138  K 3.9  CL 107  CO2 --  GLUCOSE 78  BUN 66*  CREATININE 1.70*  CALCIUM --  MG --  PHOS --   No results found for this basename: AST:2,ALT:2,ALKPHOS:2,BILITOT:2,PROT:2,ALBUMIN:2 in the last 72 hours No results found for this basename: LIPASE:2,AMYLASE:2 in the last 72 hours  Basename 10/09/11 2328 10/09/11 2320  WBC -- 18.0*  NEUTROABS -- 13.6*  HGB 12.2 11.9*  HCT 36.0 35.8*  MCV -- 91.1  PLT -- 375   No results found for this basename: CKTOTAL:3,CKMB:3,CKMBINDEX:3,TROPONINI:3 in the last 72 hours No components found with this basename: POCBNP:3 No results found for this basename: DDIMER:2 in the last 72 hours No results found for this basename: HGBA1C:2 in the last 72 hours No results found for this basename: CHOL:2,HDL:2,LDLCALC:2,TRIG:2,CHOLHDL:2,LDLDIRECT:2 in the last 72 hours No results found for this basename: TSH,T4TOTAL,FREET3,T3FREE,THYROIDAB in the last 72 hours No results found for this basename: VITAMINB12:2,FOLATE:2,FERRITIN:2,TIBC:2,IRON:2,RETICCTPCT:2 in  the last 72 hours  Micro Results: Recent Results (from the past 240 hour(s))  CULTURE, BLOOD (ROUTINE X 2)     Status: Normal   Collection Time   10/02/11  1:09 AM      Component Value Range Status Comment   Specimen Description BLOOD RIGHT ARM   Final    Special Requests BOTTLES DRAWN AEROBIC AND ANAEROBIC [facility]   Final    Culture  Setup Time [id number]   Final    Culture NO GROWTH 5 DAYS   Final    Report Status 10/08/2011 FINAL   Final   CULTURE, BLOOD (ROUTINE X 2)     Status: Normal   Collection Time   10/02/11  1:13 AM      Component Value Range Status Comment   Specimen Description BLOOD RIGHT ARM   Final    Special Requests BOTTLES DRAWN AEROBIC AND ANAEROBIC [facility]   Final    Culture  Setup Time [id number]   Final    Culture NO GROWTH 5 DAYS   Final    Report Status 10/08/2011 FINAL   Final      Radiological Exams on Admission: Dg Chest Port 1 View  10/09/2011  *RADIOLOGY REPORT*  Clinical Data: Chest pain.  History of pericarditis  PORTABLE CHEST - 1 VIEW  Comparison: Chest radiograph 10/02/2011  Findings: Right PICC line with tip in distal SVC.  Normal cardiac silhouette.  No effusion, infiltrate, or pneumothorax.  IMPRESSION: No acute cardiopulmonary findings.  Original Report Authenticated By: Genevive Bi, M.D.    Stress test done 09/30/2011  *RADIOLOGY REPORT*  Clinical Data: Chest pain  MYOCARDIAL IMAGING WITH SPECT (REST AND PHARMACOLOGIC-STRESS)  GATED LEFT VENTRICULAR WALL MOTION STUDY  LEFT VENTRICULAR EJECTION FRACTION  Technique: Standard myocardial SPECT imaging was performed after  resting intravenous injection of 10 mCi Tc-45m tetrofosmin.  Subsequently, intravenous infusion of Lexiscan was performed under  the supervision of the Cardiology staff. At peak effect of the  drug, 30 mCi Tc-105m tetrofosmin was injected intravenously and  standard myocardial SPECT imaging was performed. Quantitative  gated imaging was also performed to evaluate  left ventricular wall  motion, and estimate left ventricular ejection fraction.  Comparison: None.  Findings:  Spect: No perfusion defects.  Wall motion: Normal motion.  Ejection fraction: 61%. End diastolic volume 93 ml. End-systolic  volume 37 ml.  IMPRESSION:  No perfusion defects.  Original Report Authenticated By: Donavan Burnet,  M.D.    EKG: Normal sinus rhythm with ST segment changes  Assessment/Plan Present on Admission:  .Chest pain Admit to observation with telemetry Aspirin daily, nitroglycerin, lipid panel in a.m. Patient with recent negative stress test. Patient with endocarditis. mildly elevated troponin last visits which still continues to trend down. No EKG changes. observe overnight. Defer to a.m. team cardiology consult  Streptococcal Endocarditis with leukocytosis Continue Rocephin, CBC in a.m. The patient likely did not get glucose of Rocephin tonight HYPERLIPIDEMIA .COPD UNSPECIFIED .HYPERTENSION .HYPOTHYROIDISM .Obstructive sleep apnea on nocturnal CPAP .VERTIGO  diabetes mellitus   stable resume home medications ADA diet with sliding scale insulin  Full code DVT prophylaxis Team 2/Dr. Art Buff, Analena Gama 10/10/2011, 2:59 AM

## 2011-10-10 NOTE — ED Notes (Signed)
Foley D/C per order.

## 2011-10-10 NOTE — Consult Note (Signed)
[facility] Consult: 2:03 PM 10/10/2011   Referring Provider: Blake Divine  Primary Care Physician:  Alice Reichert, MD, MD Primary Gastroenterologist:  Jolayne Panther for inpt consult 09/28/2011  Reason for Consultation:  Dyspepsia, heartburn, chest pain  HPI: [name] is a 66 y.o. female with IDDM. Admitted recently for weakness, diarrhea, fever, chest pain. Background of IDDM and distolic heart dysfunction.  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 Multiple blood cultures grew  the Strep G. This is a subtype of Step Bovis. Given strong positive association between this organism and colon cancer, she was set up for 5/28  outpt. screening colonoscopy after she was off Plavix.  She was to have RN visit tomorrow, the 21st. She has never had colonoscopy, nor EGD before.   She discharged on 5/16.  She started the evening infusions of home abx on 5/17.  Last night, 5/19 about 10 minutes into her nightly abx infusion, she developed acute burning in chest, felt like indigestion.  Was burping a lot.  Did not have nausea.  Felt flushed.  sxs did not subside after the infusion was stopped. She was a bit SOB.  EMS came and she was treated with NTG SL and patch, the sxs subsided until this AM when the indigestion and belching recurred.  She was given 4 81 mg ASA yesterday by EMS, in addition to her morning dose of 81 mg ASA.  Last dose of Plavix was at least one week ago. Did not receive this during the recent hospital stay and was not restarted at discharge.   Her reflux had always been well controlled at home on Nexium.   Cardiologist, Dr Herbie Baltimore, has seen her and is not inclined to pursue any more cardiac tests. Her cardiac enzymes are not abnormal.    Pt has 3 months of soft to loose stools, not bloody, but she has post prandial urgency to eliminate. Stools are "rabbit -like", ie small pieces but sometimes diarrheal. No weight loss. No  belly pain. No nausea, emesis.   Course of Prednisone late last year over several weeks for Statin-induced myositis. The weakness of this had recurred and MD had restarted her on 10 mg of Prednisone recently but it did not help. Current dose is 20 mg daily  Nexium eliminates her heartburn. No dysphagia. Wt is up 40 # over the last several months.  On Plavix at home. It continues currently.      Past Medical History  Diagnosis Date  . HTN (hypertension)   . Elevated troponin level 09/28/11    Type II MI - not ACS related  . Diastolic dysfunction, left ventricle     with previous Diastolic HF  . Diabetes mellitus   . High cholesterol   . Kidney disease   . Cataracts, bilateral   . 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 Obstructive sleep apnea on nocturnal CPAP 06/20/2008    Qualifier: Diagnosis of  By: Vernie Murders    . Pulmonary HTN-severe- due to diastolic dysfunction 09/29/2011    Moderate to Severe Pulm HTN.  Marland Kitchen Acute renal failure 09/29/2011  . 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 HYPOTHYROIDISM 06/19/2008    Qualifier: Diagnosis of  By: Excell Seltzer CMA, Lawson Fiscal    . Renal artery stenosis     s/p stents (restenosis)  . Subclavian arterial stenosis   . Peripheral neuropathy     Past Surgical History  Procedure Date  . Cardiac catheterization 01/2011  Minimal luminal irregularties  . Laparoscopic tubal ligation   . Renal artery stent     x 2 (restenosis)    Prior to Admission medications   Medication Sig Start Date End Date Taking? Authorizing Provider  ALPRAZolam Prudy Feeler) 0.5 MG tablet Take 0.5 mg by mouth at bedtime as needed. For anxiety   Yes Historical Provider, MD  amLODipine (NORVASC) 10 MG tablet Take 1 tablet (10 mg total) by mouth daily. 10/06/11 10/05/12 Yes Adeline Joselyn Glassman, MD  aspirin 81 MG tablet Take 81 mg by mouth daily.     Yes Historical Provider, MD  Cholecalciferol (VITAMIN D3) 2000  UNITS TABS Take 1 tablet by mouth at bedtime.     Yes Historical Provider, MD  cloNIDine (CATAPRES - DOSED IN MG/24 HR) 0.1 mg/24hr patch Place 1 patch (0.1 mg total) onto the skin once a week. 10/06/11 10/05/12 Yes Adeline C Viyuoh, MD  dextrose 5 % SOLN 50 mL with cefTRIAXone 2 G SOLR 2 g Inject 2 g into the vein daily. 10/06/11 11/02/11 Yes Adeline C Viyuoh, MD  escitalopram (LEXAPRO) 20 MG tablet Take 20 mg by mouth daily.     Yes Historical Provider, MD  esomeprazole (NEXIUM) 40 MG capsule Take 40 mg by mouth daily as needed. For reflux   Yes Historical Provider, MD  furosemide (LASIX) 40 MG tablet Take 1 tablet (40 mg total) by mouth 2 (two) times daily. 10/06/11 10/05/12 Yes Adeline Joselyn Glassman, MD  hydrALAZINE (APRESOLINE) 50 MG tablet Take 1 tablet (50 mg total) by mouth every 6 (six) hours. 10/06/11 10/05/12 Yes Adeline C Viyuoh, MD  insulin glargine (LANTUS) 100 UNIT/ML injection Inject 62 Units into the skin daily.    Yes Historical Provider, MD  insulin lispro (HUMALOG) 100 UNIT/ML injection Inject 10-20 Units into the skin 3 (three) times daily with meals. 10 units with breakfast, 10 units with lunch, 20 units with dinner   Yes Historical Provider, MD  isosorbide mononitrate (IMDUR) 60 MG 24 hr tablet Take 60 mg by mouth daily.     Yes Historical Provider, MD  levothyroxine (SYNTHROID, LEVOTHROID) 112 MCG tablet Take 112 mcg by mouth daily.     Yes Historical Provider, MD  nebivolol (BYSTOLIC) 2.5 MG tablet Take 2.5 mg by mouth daily.     Yes Historical Provider, MD  omega-3 acid ethyl esters (LOVAZA) 1 G capsule Take 2 g by mouth 2 (two) times daily.   Yes Historical Provider, MD  potassium chloride SA (K-DUR,KLOR-CON) 20 MEQ tablet Take 20 mEq by mouth 2 (two) times daily.     Yes Historical Provider, MD  predniSONE (DELTASONE) 20 MG tablet Take 1 tablet (20 mg total) by mouth daily with breakfast. 10/06/11 10/16/11 Yes Adeline C Viyuoh, MD    Scheduled Meds:    . amLODipine  10 mg Oral Daily    . aspirin  325 mg Oral Daily  . cefTRIAXone (ROCEPHIN) IVPB 2 gram/50 mL D5W (Pyxis)  2 g Intravenous Q24H  . cloNIDine  0.1 mg Transdermal Weekly  . escitalopram  20 mg Oral Daily  . gi cocktail  30 mL Oral Once  . heparin  5,000 Units Subcutaneous Q8H  . hydrALAZINE  50 mg Oral Q6H  . insulin aspart  0-15 Units Subcutaneous TID WC  . insulin aspart  0-5 Units Subcutaneous QHS  . insulin glargine  62 Units Subcutaneous QHS  . isosorbide mononitrate  60 mg Oral Daily  . levothyroxine  112 mcg Oral Q breakfast  .  nebivolol  2.5 mg Oral Daily  . nitroGLYCERIN  0.5 inch Topical Q6H  . omega-3 acid ethyl esters  2 g Oral BID  . pantoprazole  80 mg Oral Daily  . potassium chloride SA  20 mEq Oral BID  . predniSONE  20 mg Oral Q breakfast  . sodium chloride  3 mL Intravenous Q12H  . DISCONTD: insulin glargine  62 Units Subcutaneous Daily  . DISCONTD: pantoprazole  40 mg Oral Daily   Infusions:    . sodium chloride 75 mL/hr at 10/10/11 1019   PRN Meds: acetaminophen, acetaminophen, ALPRAZolam, HYDROcodone-acetaminophen, ondansetron (ZOFRAN) IV, ondansetron   Allergies as of 10/09/2011 - Review Complete 10/09/2011  Allergen Reaction Noted  . Atenolol    . Bystolic (nebivolol hcl)  02/03/2011  . Ciprofloxacin    . Codeine    . Lidocaine    . Metformin    . Metoprolol    . Penicillins      Family History  Problem Relation Age of Onset  . Heart disease Mother   . Heart disease Father   . Heart disease Sister   . Heart disease Daughter     History   Social History  . Marital Status: Married    Spouse Name: N/A    Number of Children: N/A  . Years of Education: N/A   Occupational History  . retired    Social History Main Topics  . Smoking status: Former Smoker -- 1.0 packs/day for 25 years    Types: Cigarettes    Quit date: 05/23/2004  . Smokeless tobacco: Not on file  . Alcohol Use: No  . Drug Use: No  . Sexually Active: Not Currently   Other Topics Concern   . Not on file   Social History Narrative  . No narrative on file    REVIEW OF SYSTEMS: anorexia, fever, weight loss,, vision loss, decreased hearing, hoarseness, chest pain, syncope, dyspnea on exertion, peripheral edema, balance deficits, hemoptysis, abdominal pain, melena, hematochezia, severe indigestion/heartburn, hematuria, incontinence, genital sores, muscle weakness, suspicious skin lesions, transient blindness, difficulty walking, depression, unusual weight change, abnormal bleeding, enlarged lymph nodes, angioedema, and breast masses.   PHYSICAL EXAM: Vital signs in last 24 hours: Temp:  [97.8 F (36.6 C)-98.6 F (37 C)] 97.8 F (36.6 C) (05/20 1253) Pulse Rate:  [47-62] 62  (05/20 1253) Resp:  [13-18] 16  (05/20 1253) BP: (127-169)/(35-88) 127/64 mmHg (05/20 1253) SpO2:  [96 %-100 %] 98 % (05/20 1253) Weight:  [176 lb 12.9 oz (80.2 kg)] 176 lb 12.9 oz (80.2 kg) (05/20 0415)  General: cushingoid appearing WF sho is diaphoretic, pale and looks unwell but she looks about the same as when I saw her on the of [place]:  Normocephallic, atraumatic.  Moon-faced.  Eyes:  No icterus or pallor Ears:  Not HOH  Nose:  No discharge or congestion Mouth:  Moist, pink, clear oral MM Neck:  No jvd or bruits.  No masses Lungs:  Clear B.  Not SOB.  No cough Heart: RRR, no MRG Abdomen:  Soft, NT, ND, moderately obese.  No mass or HSM.   Rectal: deferred   Musc/Skeltl: no joint deformity or swellilng. Extremities:  No pedal edema.  PICC line in right arm  Neurologic:  Pleasant.  Not confused.  Moves all 4s. Slightly tremulous. Skin:  No rash, sores. Tattoos:  none Nodes:  No adenopathy at neck or axilla   Psych:  Pleasant, depressed/flat affect.  Not anxious.   Intake/Output from previous  day: 05/19 0701 - 05/20 0700 In: -  Out: 1300 [Urine:1300] Intake/Output this shift: Total I/O In: 230 [P.O.:230] Out: -   LAB RESULTS:  Basename 10/10/11 0500 10/09/11 2328 10/09/11 2320   WBC 13.3* -- 18.0*  HGB 11.5* 12.2 11.9*  HCT 33.9* 36.0 35.8*  PLT 353 -- 375   BMET Lab Results  Component Value Date   NA 135 10/10/2011   NA 138 10/09/2011   NA 141 10/05/2011   K 3.8 10/10/2011   K 3.9 10/09/2011   K 4.0 10/05/2011   CL 100 10/10/2011   CL 107 10/09/2011   CL 105 10/05/2011   CO2 22 10/10/2011   CO2 24 10/05/2011   CO2 24 10/04/2011   GLUCOSE 193* 10/10/2011   GLUCOSE 78 10/09/2011   GLUCOSE 55* 10/05/2011   BUN 68* 10/10/2011   BUN 66* 10/09/2011   BUN 34* 10/05/2011   CREATININE 1.53* 10/10/2011   CREATININE 1.70* 10/09/2011   CREATININE 1.38* 10/05/2011   CALCIUM 9.3 10/10/2011   CALCIUM 10.1 10/05/2011   CALCIUM 9.8 10/04/2011   LFT No results found for this basename: PROT:3,ALBUMIN:3,AST:3,ALT:3,ALKPHOS:3,BILITOT:3,BILIDIR:3,IBILI:3 in the last 72 hours  RADIOLOGY STUDIES: Dg Chest Port 1 View 10/09/2011  *RADIOLOGY REPORT*  Clinical Data: Chest pain.  History of pericarditis  PORTABLE CHEST - 1 VIEW  Comparison: Chest radiograph 10/02/2011  Findings: Right PICC line with tip in distal SVC.  Normal cardiac silhouette.  No effusion, infiltrate, or pneumothorax.  IMPRESSION: No acute cardiopulmonary findings.  Original Report Authenticated By: Genevive Bi, M.D.    ENDOSCOPIC STUDIES: None ever.   IMPRESSION: *  Indegestion, chest burning.   *  Strp Galalytticus endocarditis.  On Rocehin *  Chronic Plavix.  For hx renal artery stents.  *  DM 2.  Long standing IDDM *  OSA.  On CPAP at home.  * Statin-induced myositis, completed steroid taper last year, restarted on lower dose prednisone recently by neurologist at [facility] for recurrence of muscle weakness.  * Recent acute renal failure.  Current BUN and creat are worse than at discharge.  PLAN: Since she has been off Plavix, but not ASA, for at least 7 days, probably best to pursue the screening colonoscopy plus EGD (to eval this chest pain) while she is an inpt.   LOS: 1 day   [name]  10/10/2011, 2:03  PM Pager: [phone]

## 2011-10-10 NOTE — Progress Notes (Signed)
Subjective: SHE HAD CHEST Pain relieved with GI cocktail. EKG showed T WAVE inversions in I, aVl and lateral leads.   Objective: Weight change:   Intake/Output Summary (Last 24 hours) at 10/10/11 1400 Last data filed at 10/10/11 1100  Gross per 24 hour  Intake    230 ml  Output   1300 ml  Net  -1070 ml    Filed Vitals:   10/10/11 1253  BP: 127/64  Pulse: 62  Temp: 97.8 F (36.6 C)  Resp: 16   On exam  She is alert afebrile comfortable after the gi cocktail CVS s1s2 heard Lungs: clear Abdomen: soft non tender ND BS+ Extremities: no pedal edema.  Lab Results: @labtest @  Micro Results: Recent Results (from the past 240 hour(s))  CULTURE, BLOOD (ROUTINE X 2)     Status: Normal   Collection Time   10/02/11  1:09 AM      Component Value Range Status Comment   Specimen Description BLOOD RIGHT ARM   Final    Special Requests BOTTLES DRAWN AEROBIC AND ANAEROBIC [facility]   Final    Culture  Setup Time [id number]   Final    Culture NO GROWTH 5 DAYS   Final    Report Status 10/08/2011 FINAL   Final   CULTURE, BLOOD (ROUTINE X 2)     Status: Normal   Collection Time   10/02/11  1:13 AM      Component Value Range Status Comment   Specimen Description BLOOD RIGHT ARM   Final    Special Requests BOTTLES DRAWN AEROBIC AND ANAEROBIC [facility]   Final    Culture  Setup Time [id number]   Final    Culture NO GROWTH 5 DAYS   Final    Report Status 10/08/2011 FINAL   Final     Studies/Results: Dg Chest 2 View  09/28/2011  *RADIOLOGY REPORT*  Clinical Data: Leukocytosis.  Weakness.  Tachycardia.  CHEST - 2 VIEW  Comparison: 09/27/2011  Findings: Lung volumes are low. Cardiopericardial silhouette is at upper limits of normal for size. Interstitial markings are diffusely coarsened with chronic features.  No edema or focal airspace consolidation.  No pleural effusion. Imaged bony structures of the thorax are intact.  IMPRESSION: Low volume film with borderline cardiomegaly underlying  chronic interstitial coarsening.  No interval change.  Original Report Authenticated By: ERIC A. MANSELL, M.D.   Dg Chest 2 View  09/27/2011  *RADIOLOGY REPORT*  Clinical Data: Chest discomfort.  CHEST - 2 VIEW  Comparison: 02/10/2011  Findings: The lungs are clear without focal infiltrate, edema, pneumothorax or pleural effusion. Interstitial markings are diffusely coarsened with chronic features.  Cardiopericardial silhouette is at upper limits of normal for size. Imaged bony structures of the thorax are intact.  IMPRESSION: Stable.  No acute findings.  Original Report Authenticated By: ERIC A. MANSELL, M.D.   Ct Head Wo Contrast  09/27/2011  *RADIOLOGY REPORT*  Clinical Data: Nausea, vomiting, diarrhea for 24 hours.  Weakness, numbness and parasthesias of all four extremities.  Headache.  CT HEAD WITHOUT CONTRAST  Technique:  Contiguous axial images were obtained from the base of the skull through the vertex without contrast.  Comparison: 05/26/2006.  Findings: No mass lesion, mass effect, midline shift, hydrocephalus, hemorrhage.  No acute territorial cortical ischemia/infarct. Atrophy and chronic ischemic white matter disease is present.  Paranasal sinuses appear normal.  Mastoid air cells are within normal limits.  Intracranial atherosclerosis.  IMPRESSION: Mild atrophy and chronic ischemic white matter disease  without acute intracranial abnormality.  Original Report Authenticated By: Andreas Newport, M.D.   Nm Myocar Multi W/spect W/wall Motion / Ef  09/30/2011  *RADIOLOGY REPORT*  Clinical Data:  Chest pain  MYOCARDIAL IMAGING WITH SPECT (REST AND PHARMACOLOGIC-STRESS) GATED LEFT VENTRICULAR WALL MOTION STUDY LEFT VENTRICULAR EJECTION FRACTION  Technique:  Standard myocardial SPECT imaging was performed after resting intravenous injection of 10 mCi Tc-34m tetrofosmin. Subsequently, intravenous infusion of Lexiscan was performed under the supervision of the Cardiology staff.  At peak effect of the drug,  30 mCi Tc-20m tetrofosmin was injected intravenously and standard myocardial SPECT  imaging was performed.  Quantitative gated imaging was also performed to evaluate left ventricular wall motion, and estimate left ventricular ejection fraction.  Comparison:  None.  Findings:  Spect:  No perfusion defects.  Wall motion:  Normal motion.  Ejection fraction:  61%.  End diastolic volume 93 ml.  End-systolic volume 37 ml.  IMPRESSION: No perfusion defects.  Original Report Authenticated By: Donavan Burnet, M.D.   Dg Chest Port 1 View  10/09/2011  *RADIOLOGY REPORT*  Clinical Data: Chest pain.  History of pericarditis  PORTABLE CHEST - 1 VIEW  Comparison: Chest radiograph 10/02/2011  Findings: Right PICC line with tip in distal SVC.  Normal cardiac silhouette.  No effusion, infiltrate, or pneumothorax.  IMPRESSION: No acute cardiopulmonary findings.  Original Report Authenticated By: Genevive Bi, M.D.   Dg Chest Port 1 View  10/02/2011  *RADIOLOGY REPORT*  Clinical Data: Dyspnea and weakness.  PORTABLE CHEST - 1 VIEW  Comparison: 09/28/2011  Findings: Mild cardiac enlargement with normal pulmonary vascularity.  Emphysematous changes with interstitial fibrosis suggested.  No blunting of costophrenic angles.  No focal airspace consolidation.  No pneumothorax.  IMPRESSION: Stable cardiac enlargement.  No evidence of vascular congestion or edema.  No focal airspace disease.  Original Report Authenticated By: Marlon Pel, M.D.   Medications: Scheduled Meds:   . amLODipine  10 mg Oral Daily  . aspirin  325 mg Oral Daily  . cefTRIAXone (ROCEPHIN) IVPB 2 gram/50 mL D5W (Pyxis)  2 g Intravenous Q24H  . cloNIDine  0.1 mg Transdermal Weekly  . escitalopram  20 mg Oral Daily  . gi cocktail  30 mL Oral Once  . heparin  5,000 Units Subcutaneous Q8H  . hydrALAZINE  50 mg Oral Q6H  . insulin aspart  0-15 Units Subcutaneous TID WC  . insulin aspart  0-5 Units Subcutaneous QHS  . insulin glargine  62 Units  Subcutaneous QHS  . isosorbide mononitrate  60 mg Oral Daily  . levothyroxine  112 mcg Oral Q breakfast  . nebivolol  2.5 mg Oral Daily  . nitroGLYCERIN  0.5 inch Topical Q6H  . omega-3 acid ethyl esters  2 g Oral BID  . pantoprazole  80 mg Oral Daily  . potassium chloride SA  20 mEq Oral BID  . predniSONE  20 mg Oral Q breakfast  . sodium chloride  3 mL Intravenous Q12H  . DISCONTD: insulin glargine  62 Units Subcutaneous Daily  . DISCONTD: pantoprazole  40 mg Oral Daily   Continuous Infusions:   . sodium chloride 75 mL/hr at 10/10/11 1019   PRN Meds:.acetaminophen, acetaminophen, ALPRAZolam, HYDROcodone-acetaminophen, ondansetron (ZOFRAN) IV, ondansetron  Assessment/Plan: Patient Active Hospital Problem List: Chest pain (10/10/2011): atypical probably GERD. Cardiology and GI on board. Possible EGD AND Colonoscopy in am.    HYPOTHYROIDISM (06/19/2008) Continue with home doses of synthroid.   HYPERTENSION (06/19/2008)   Better controlled.  Asthma: better controlled.   Obstructive sleep apnea on nocturnal CPAP (06/20/2008) CPAP AT NIGHT.  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  Diabetes mellitus (10/10/2011) Not well controlled.   CBG (last 3)   Basename 10/10/11 1632 10/10/11 1156 10/10/11 0611  GLUCAP 283* 256* 157*   continue with SSI.  CONTINUE WITH Lantus.  Acute Diastolic Heart failure: continue with lasix and hydralazine, nebivolol and aldactone.   DVT prophylaxis. Full code.     LOS: 1 day   Bence Trapp 10/10/2011, 2:00 PM

## 2011-10-10 NOTE — ED Notes (Signed)
Per EMS and pt, Pts cardiologist okayed the use of PICC line.

## 2011-10-11 ENCOUNTER — Other Ambulatory Visit: Payer: BC Managed Care – PPO | Admitting: Internal Medicine

## 2011-10-11 DIAGNOSIS — R079 Chest pain, unspecified: Secondary | ICD-10-CM

## 2011-10-11 DIAGNOSIS — E1165 Type 2 diabetes mellitus with hyperglycemia: Secondary | ICD-10-CM

## 2011-10-11 DIAGNOSIS — N189 Chronic kidney disease, unspecified: Secondary | ICD-10-CM

## 2011-10-11 DIAGNOSIS — I330 Acute and subacute infective endocarditis: Secondary | ICD-10-CM

## 2011-10-11 LAB — GLUCOSE, CAPILLARY
Glucose-Capillary: 197 mg/dL — ABNORMAL HIGH (ref 70–99)
Glucose-Capillary: 345 mg/dL — ABNORMAL HIGH (ref 70–99)

## 2011-10-11 LAB — TROPONIN I: Troponin I: <0.3 ng/mL (ref <0.30)

## 2011-10-11 MED ORDER — BISACODYL 5 MG PO TBEC
5.0000 mg | DELAYED_RELEASE_TABLET | Freq: Three times a day (TID) | ORAL | Status: AC
  Administered 2011-10-11 (×2): 5 mg via ORAL
  Filled 2011-10-11 (×2): qty 1

## 2011-10-11 MED ORDER — INSULIN ASPART 100 UNIT/ML ~~LOC~~ SOLN
3.0000 [IU] | Freq: Three times a day (TID) | SUBCUTANEOUS | Status: DC
  Administered 2011-10-11 – 2011-10-12 (×2): 3 [IU] via SUBCUTANEOUS

## 2011-10-11 MED ORDER — PEG-KCL-NACL-NASULF-NA ASC-C 100 G PO SOLR
1.0000 | Freq: Once | ORAL | Status: AC
  Administered 2011-10-11: 100 g via ORAL
  Filled 2011-10-11: qty 1

## 2011-10-11 NOTE — Progress Notes (Signed)
Inpatient Diabetes Program Recommendations  AACE/ADA: New Consensus Statement on Inpatient Glycemic Control (2009)  Target Ranges:  Prepandial:   less than 140 mg/dL      Peak postprandial:   less than 180 mg/dL (1-2 hours)      Critically ill patients:  140 - 180 mg/dL   Results for SHALAWN, WYNDER (MRN [id number]) as of 10/11/2011 09:12  Ref. Range 10/10/2011 06:11 10/10/2011 11:56 10/10/2011 16:32 10/10/2011 20:32 10/11/2011 06:15  Glucose-Capillary Latest Range: 70-99 mg/dL 409 (H) 811 (H) 914 (H) 202 (H) 197 (H)   Home dose of Lantus was entered yesterday but does not start until tonight 5/21.  Recommend addition of Novolog meal coverage 5 units TID. Will follow.  Thank you  Piedad [facility] Inpatient Diabetes Coordinator [phone]

## 2011-10-11 NOTE — Progress Notes (Signed)
Subjective:  No chest pain  Objective:  Vital Signs in the last 24 hours: Temp:  [97.8 F (36.6 C)-99.3 F (37.4 C)] 98.7 F (37.1 C) (05/21 0450) Pulse Rate:  [56-62] 56  (05/21 0450) Resp:  [16-18] 18  (05/21 0450) BP: (119-168)/(53-72) 161/72 mmHg (05/21 0450) SpO2:  [92 %-98 %] 92 % (05/21 0450)  Intake/Output from previous day:  Intake/Output Summary (Last 24 hours) at 10/11/11 0825 Last data filed at 10/10/11 2038  Gross per 24 hour  Intake 1807.5 ml  Output    300 ml  Net 1507.5 ml    Physical Exam: General appearance: alert, cooperative and no distress Lungs: clear to auscultation bilaterally Heart: regular rate and rhythm   Rate: 58  Rhythm: sinus bradycardia  Lab Results:  Basename 10/10/11 0500 10/09/11 2328 10/09/11 2320  WBC 13.3* -- 18.0*  HGB 11.5* 12.2 --  PLT 353 -- 375    Basename 10/10/11 0500 10/09/11 2328  NA 135 138  K 3.8 3.9  CL 100 107  CO2 22 --  GLUCOSE 193* 78  BUN 68* 66*  CREATININE 1.53* 1.70*    Basename 10/11/11 0525 10/10/11 2330  TROPONINI <0.30 <0.30   Hepatic Function Panel No results found for this basename: PROT,ALBUMIN,AST,ALT,ALKPHOS,BILITOT,BILIDIR,IBILI in the last 72 hours  Basename 10/10/11 0500  CHOL 235*   No results found for this basename: INR in the last 72 hours  Imaging: Dg Chest Port 1 View  10/09/2011  *RADIOLOGY REPORT*  Clinical Data: Chest pain.  History of pericarditis  PORTABLE CHEST - 1 VIEW  Comparison: Chest radiograph 10/02/2011  Findings: Right PICC line with tip in distal SVC.  Normal cardiac silhouette.  No effusion, infiltrate, or pneumothorax.  IMPRESSION: No acute cardiopulmonary findings.  Original Report Authenticated By: Genevive Bi, M.D.    Cardiac Studies:  Assessment/Plan:   Principal Problem:  *Chest pain Active Problems:  HYPOTHYROIDISM  HYPERLIPIDEMIA  HYPERTENSION  HYPERTENSIVE CARDIOVASCULAR DISEASE  COPD UNSPECIFIED  VERTIGO  Obstructive sleep apnea on  nocturnal CPAP  Streptococcal endocarditis  Diabetes mellitus  RAS (renal artery stenosis)  Esophageal reflux  Special screening for malignant neoplasms, colon   Plan-GI work up pending. B/P is better.   Corine Shelter PA-C 10/11/2011, 8:25 AM    Agree with note written by Corine Shelter [facility]  W/U noted. Has SBE presumably of GI source. GI w/u in progress. Cardiac stable. H/O nl cors and LV fxn. 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 Exam benign except for foft out flow murmur. Will continue to follow. Renal fxn improving.  Runell Gess 10/11/2011 8:34 AM

## 2011-10-11 NOTE — Progress Notes (Addendum)
Subjective: Chest pain and epigastric pain resolved.  No nausea vomiting or abdominal pain.  Eager to go home.   Objective: Weight change:   Intake/Output Summary (Last 24 hours) at 10/11/11 1402 Last data filed at 10/10/11 2038  Gross per 24 hour  Intake 1577.5 ml  Output    300 ml  Net 1277.5 ml    Filed Vitals:   10/11/11 0450  BP: 161/72  Pulse: 56  Temp: 98.7 F (37.1 C)  Resp: 18  On exam  She is alert afebrile comfortable  Sitting up in the bed.  CVS s1s2 heard, no MRG, RRR Lungs: CTAB, no wheezing, rhonchi, or rales. Abdomen: soft non tender ND BS+  Extremities: no pedal edema.  Neuro; no focal deficits.     Lab Results: Results for orders placed during the hospital encounter of 10/09/11 (from the past 24 hour(s))  TROPONIN I     Status: Normal   Collection Time   10/10/11  3:30 PM      Component Value Range   Troponin I <0.30  <0.30 (ng/mL)  GLUCOSE, CAPILLARY     Status: Abnormal   Collection Time   10/10/11  4:32 PM      Component Value Range   Glucose-Capillary 283 (*) 70 - 99 (mg/dL)   Comment 1 Documented in Chart     Comment 2 Notify RN    HEMOGLOBIN A1C     Status: Abnormal   Collection Time   10/10/11  4:40 PM      Component Value Range   Hemoglobin A1C 8.2 (*) <5.7 (%)   Mean Plasma Glucose 189 (*) <117 (mg/dL)  GLUCOSE, CAPILLARY     Status: Abnormal   Collection Time   10/10/11  8:32 PM      Component Value Range   Glucose-Capillary 202 (*) 70 - 99 (mg/dL)   Comment 1 Documented in Chart     Comment 2 Notify RN    TROPONIN I     Status: Normal   Collection Time   10/10/11 11:30 PM      Component Value Range   Troponin I <0.30  <0.30 (ng/mL)  TROPONIN I     Status: Normal   Collection Time   10/11/11  5:25 AM      Component Value Range   Troponin I <0.30  <0.30 (ng/mL)  GLUCOSE, CAPILLARY     Status: Abnormal   Collection Time   10/11/11  6:15 AM      Component Value Range   Glucose-Capillary 197 (*) 70 - 99 (mg/dL)   Comment 1  Notify RN    GLUCOSE, CAPILLARY     Status: Abnormal   Collection Time   10/11/11 11:30 AM      Component Value Range   Glucose-Capillary 299 (*) 70 - 99 (mg/dL)   Comment 1 Notify RN       Micro Results: Recent Results (from the past 240 hour(s))  CULTURE, BLOOD (ROUTINE X 2)     Status: Normal   Collection Time   10/02/11  1:09 AM      Component Value Range Status Comment   Specimen Description BLOOD RIGHT ARM   Final    Special Requests BOTTLES DRAWN AEROBIC AND ANAEROBIC [facility]   Final    Culture  Setup Time [id number]   Final    Culture NO GROWTH 5 DAYS   Final    Report Status 10/08/2011 FINAL   Final   CULTURE, BLOOD (ROUTINE X  2)     Status: Normal   Collection Time   10/02/11  1:13 AM      Component Value Range Status Comment   Specimen Description BLOOD RIGHT ARM   Final    Special Requests BOTTLES DRAWN AEROBIC AND ANAEROBIC [facility]   Final    Culture  Setup Time [id number]   Final    Culture NO GROWTH 5 DAYS   Final    Report Status 10/08/2011 FINAL   Final     Studies/Results: Dg Chest 2 View  09/28/2011  *RADIOLOGY REPORT*  Clinical Data: Leukocytosis.  Weakness.  Tachycardia.  CHEST - 2 VIEW  Comparison: 09/27/2011  Findings: Lung volumes are low. Cardiopericardial silhouette is at upper limits of normal for size. Interstitial markings are diffusely coarsened with chronic features.  No edema or focal airspace consolidation.  No pleural effusion. Imaged bony structures of the thorax are intact.  IMPRESSION: Low volume film with borderline cardiomegaly underlying chronic interstitial coarsening.  No interval change.  Original Report Authenticated By: ERIC A. MANSELL, M.D.   Dg Chest 2 View  09/27/2011  *RADIOLOGY REPORT*  Clinical Data: Chest discomfort.  CHEST - 2 VIEW  Comparison: 02/10/2011  Findings: The lungs are clear without focal infiltrate, edema, pneumothorax or pleural effusion. Interstitial markings are diffusely coarsened with chronic features.   Cardiopericardial silhouette is at upper limits of normal for size. Imaged bony structures of the thorax are intact.  IMPRESSION: Stable.  No acute findings.  Original Report Authenticated By: ERIC A. MANSELL, M.D.   Ct Head Wo Contrast  09/27/2011  *RADIOLOGY REPORT*  Clinical Data: Nausea, vomiting, diarrhea for 24 hours.  Weakness, numbness and parasthesias of all four extremities.  Headache.  CT HEAD WITHOUT CONTRAST  Technique:  Contiguous axial images were obtained from the base of the skull through the vertex without contrast.  Comparison: 05/26/2006.  Findings: No mass lesion, mass effect, midline shift, hydrocephalus, hemorrhage.  No acute territorial cortical ischemia/infarct. Atrophy and chronic ischemic white matter disease is present.  Paranasal sinuses appear normal.  Mastoid air cells are within normal limits.  Intracranial atherosclerosis.  IMPRESSION: Mild atrophy and chronic ischemic white matter disease without acute intracranial abnormality.  Original Report Authenticated By: Andreas Newport, M.D.   Nm Myocar Multi W/spect W/wall Motion / Ef  09/30/2011  *RADIOLOGY REPORT*  Clinical Data:  Chest pain  MYOCARDIAL IMAGING WITH SPECT (REST AND PHARMACOLOGIC-STRESS) GATED LEFT VENTRICULAR WALL MOTION STUDY LEFT VENTRICULAR EJECTION FRACTION  Technique:  Standard myocardial SPECT imaging was performed after resting intravenous injection of 10 mCi Tc-97m tetrofosmin. Subsequently, intravenous infusion of Lexiscan was performed under the supervision of the Cardiology staff.  At peak effect of the drug, 30 mCi Tc-5m tetrofosmin was injected intravenously and standard myocardial SPECT  imaging was performed.  Quantitative gated imaging was also performed to evaluate left ventricular wall motion, and estimate left ventricular ejection fraction.  Comparison:  None.  Findings:  Spect:  No perfusion defects.  Wall motion:  Normal motion.  Ejection fraction:  61%.  End diastolic volume 93 ml.   End-systolic volume 37 ml.  IMPRESSION: No perfusion defects.  Original Report Authenticated By: Donavan Burnet, M.D.   Dg Chest Port 1 View  10/09/2011  *RADIOLOGY REPORT*  Clinical Data: Chest pain.  History of pericarditis  PORTABLE CHEST - 1 VIEW  Comparison: Chest radiograph 10/02/2011  Findings: Right PICC line with tip in distal SVC.  Normal cardiac silhouette.  No effusion, infiltrate, or pneumothorax.  IMPRESSION: No acute cardiopulmonary findings.  Original Report Authenticated By: Genevive Bi, M.D.   Dg Chest Port 1 View  10/02/2011  *RADIOLOGY REPORT*  Clinical Data: Dyspnea and weakness.  PORTABLE CHEST - 1 VIEW  Comparison: 09/28/2011  Findings: Mild cardiac enlargement with normal pulmonary vascularity.  Emphysematous changes with interstitial fibrosis suggested.  No blunting of costophrenic angles.  No focal airspace consolidation.  No pneumothorax.  IMPRESSION: Stable cardiac enlargement.  No evidence of vascular congestion or edema.  No focal airspace disease.  Original Report Authenticated By: Marlon Pel, M.D.   Medications: Scheduled Meds:   . amLODipine  10 mg Oral Daily  . aspirin  325 mg Oral Daily  . bisacodyl  5 mg Oral Q8H  . cefTRIAXone (ROCEPHIN) IVPB 2 gram/50 mL D5W (Pyxis)  2 g Intravenous Q24H  . cloNIDine  0.1 mg Transdermal Weekly  . escitalopram  20 mg Oral Daily  . furosemide  40 mg Oral BID  . gi cocktail  30 mL Oral Once  . heparin  5,000 Units Subcutaneous Q8H  . hydrALAZINE  50 mg Oral Q6H  . insulin aspart  0-15 Units Subcutaneous TID WC  . insulin aspart  0-5 Units Subcutaneous QHS  . insulin glargine  62 Units Subcutaneous QHS  . isosorbide mononitrate  60 mg Oral Daily  . levothyroxine  112 mcg Oral Q breakfast  . nebivolol  2.5 mg Oral Daily  . nitroGLYCERIN  0.5 inch Topical Q6H  . omega-3 acid ethyl esters  2 g Oral BID  . pantoprazole  80 mg Oral Daily  . peg 3350 powder  1 kit Oral Once  . potassium chloride SA  20 mEq Oral  BID  . predniSONE  20 mg Oral Q breakfast  . sodium chloride  10-40 mL Intracatheter Q12H  . sodium chloride  3 mL Intravenous Q12H  . spironolactone  25 mg Oral Daily   Continuous Infusions:   . DISCONTD: sodium chloride 75 mL/hr at 10/10/11 1019   PRN Meds:.acetaminophen, acetaminophen, ALPRAZolam, HYDROcodone-acetaminophen, ondansetron (ZOFRAN) IV, ondansetron, sodium chloride   Interim Summary:   66 year old female with recent diagnosis of strep endocarditis. She's on Rocephin 2 g IV daily, came in on 5/20 for substernal chest pain. She was admitted to hospitalist service for evaluation of ACS. She had a Cardiac cath done in 2012 which was essentially normal and a stress test done this year which was also normal. She was admitted on 09/29/2011 for chest pain and was found to have streptococcal endocarditis, she was started on rocephin and discharged. She came in yesterday for similar chest pain, she was given GI cocktail and her pain resolved. We have consulted GI for possible EGD and colonoscopy, which is scheduled tomorrow. Cardiology was consulted and recommended no further cardiac interventions at this point and added spironolactone and increased Hydralazine.   Consults: 1. Cardiology 2. Gastroenterology from [place]  Assessment/Plan: Patient Active Hospital Problem List:  Atypical Chest pain (10/10/2011): resolved with GI cocktail.   -cardiac enzymes negative -EKG no ischemic changes -scheduled for EGD and colonoscopy in am.  - NPO after midnight. 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    HYPERTENSION (06/19/2008) Not well controlled.  Continue with Norvasc, clonidine patch, hydralazine 50 mg Q6   hours, imdur 60 mg daily, nebivolol 2.5mg  daily.  Her Imdur can be increased to 90  mg if her Bp doesn't get better by the   Morning.   COPD UNSPECIFIED (07/22/2008) Stable.   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  -Evaluation with colonoscopy for colon source.    Diabetes mellitus (10/10/2011)  -Not well controlled. Lantus was scheduled from tonight.   CBG (last 3)   Basename 10/11/11 1130 10/11/11 0615 10/10/11 2032  GLUCAP 299* 197* 202*   Will add pre meal Novo log coverage 3 units TIDAC.    RAS (renal artery stenosis) with renal insufficiency: 10/10/2011) - S/P STENT placement.  -improving - repeat labs in am.   Esophageal reflux (10/10/2011) Continue with PPI  Leukocytosis:  -probably secondary to steroids vs from endocarditis. UA negative, CXR negative for pneumonia. Improving. Repeat labs in am.  Anemia:    -Normocytic, probably Anemia of chronic diease. Stable.   Depression: - on lexapro  DVT prophylaxis  Full Code     LOS: 2 days   Myca Perno 10/11/2011, 2:02 PM

## 2011-10-11 NOTE — Progress Notes (Signed)
     [facility] Daily Rounding Note 10/11/2011, 9:47 AM  SUBJECTIVE:       No further chest pain.  Feels weak  OBJECTIVE:        General: looks unwell     Vital signs in last 24 hours:    Temp:  [97.8 F (36.6 C)-99.3 F (37.4 C)] 98.7 F (37.1 C) (05/21 0450) Pulse Rate:  [56-62] 56  (05/21 0450) Resp:  [16-18] 18  (05/21 0450) BP: (119-168)/(53-72) 161/72 mmHg (05/21 0450) SpO2:  [92 %-98 %] 92 % (05/21 0450) Last BM Date: 10/10/11  Heart: RRR Chest: clear but overall diminished BS.  No cough or SOB Abdomen: NT, ND, soft.  Active BS  Extremities: edema in feet, sushingoid Neuro/Psych:  Pleasant, not confused.  Flat affect. No tremor  Intake/Output from previous day: 05/20 0701 - 05/21 0700 In: 1807.5 [P.O.:830; I.V.:977.5] Out: 300 [Urine:300]  Intake/Output this shift:    Lab Results:  Basename 10/10/11 0500 10/09/11 2328 10/09/11 2320  WBC 13.3* -- 18.0*  HGB 11.5* 12.2 11.9*  HCT 33.9* 36.0 35.8*  PLT 353 -- 375   BMET  Basename 10/10/11 0500 10/09/11 2328  NA 135 138  K 3.8 3.9  CL 100 107  CO2 22 --  GLUCOSE 193* 78  BUN 68* 66*  CREATININE 1.53* 1.70*  CALCIUM 9.3 --   ASSESMENT: * Indegestion, chest burning. Resolved.  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  hx renal artery stents.  * DM 2. Long standing IDDM  * OSA. On CPAP at home.  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  * Recent acute renal failure. Chronic kidney disease.    PLAN: Colonoscopy and EGD 12:30 tomorrow. Stop sq heparin after this evenings dose.  split dose movi prep   LOS: 2 days   [name]  10/11/2011, 9:47 AM Pager: [phone]

## 2011-10-11 NOTE — Progress Notes (Signed)
I have personally taken an interval history, reviewed the chart, and examined the patient.  I agree with the extender's note, impression and recommendations.  

## 2011-10-12 ENCOUNTER — Encounter (HOSPITAL_COMMUNITY)
Admission: EM | Disposition: A | Discharge status: Home / Self Care | Payer: Self-pay | Source: Ambulatory Visit | Attending: Emergency Medicine

## 2011-10-12 ENCOUNTER — Encounter (HOSPITAL_COMMUNITY): Payer: Self-pay | Admitting: Gastroenterology

## 2011-10-12 DIAGNOSIS — N189 Chronic kidney disease, unspecified: Secondary | ICD-10-CM

## 2011-10-12 DIAGNOSIS — D126 Benign neoplasm of colon, unspecified: Secondary | ICD-10-CM

## 2011-10-12 DIAGNOSIS — I330 Acute and subacute infective endocarditis: Secondary | ICD-10-CM

## 2011-10-12 DIAGNOSIS — R079 Chest pain, unspecified: Secondary | ICD-10-CM

## 2011-10-12 DIAGNOSIS — IMO0001 Reserved for inherently not codable concepts without codable children: Secondary | ICD-10-CM

## 2011-10-12 DIAGNOSIS — E1165 Type 2 diabetes mellitus with hyperglycemia: Secondary | ICD-10-CM

## 2011-10-12 HISTORY — PX: COLONOSCOPY: SHX5424

## 2011-10-12 HISTORY — PX: ESOPHAGOGASTRODUODENOSCOPY: SHX5428

## 2011-10-12 LAB — GLUCOSE, CAPILLARY: Glucose-Capillary: 220 mg/dL — ABNORMAL HIGH (ref 70–99)

## 2011-10-12 LAB — BASIC METABOLIC PANEL
Calcium: 9.8 mg/dL (ref 8.4–10.5)
Chloride: 101 mEq/L (ref 96–112)
Creatinine, Ser: 1.57 mg/dL — ABNORMAL HIGH (ref 0.50–1.10)
GFR calc Af Amer: 39 mL/min — ABNORMAL LOW (ref >90)

## 2011-10-12 LAB — CBC
MCV: 89.7 fL (ref 78.0–100.0)
Platelets: 360 10*3/uL (ref 150–400)
RDW: 13.4 % (ref 11.5–15.5)
WBC: 11.8 10*3/uL — ABNORMAL HIGH (ref 4.0–10.5)

## 2011-10-12 LAB — MAGNESIUM: Magnesium: 2.2 mg/dL (ref 1.5–2.5)

## 2011-10-12 SURGERY — COLONOSCOPY
Anesthesia: Moderate Sedation

## 2011-10-12 MED ORDER — INSULIN GLARGINE 100 UNIT/ML ~~LOC~~ SOLN
65.0000 [IU] | Freq: Every day | SUBCUTANEOUS | Status: DC
  Administered 2011-10-12: 65 [IU] via SUBCUTANEOUS

## 2011-10-12 MED ORDER — GLYCOPYRROLATE 0.2 MG/ML IJ SOLN
INTRAMUSCULAR | Status: DC | PRN
  Administered 2011-10-12: 0.2 mg via INTRAVENOUS

## 2011-10-12 MED ORDER — FENTANYL CITRATE 0.05 MG/ML IJ SOLN
INTRAMUSCULAR | Status: AC
  Filled 2011-10-12: qty 4

## 2011-10-12 MED ORDER — SPOT INK MARKER SYRINGE KIT
PACK | SUBMUCOSAL | Status: DC | PRN
  Administered 2011-10-12: 1.3 mL via SUBMUCOSAL

## 2011-10-12 MED ORDER — GLYCOPYRROLATE 0.2 MG/ML IJ SOLN
INTRAMUSCULAR | Status: AC
  Filled 2011-10-12: qty 1

## 2011-10-12 MED ORDER — INSULIN ASPART 100 UNIT/ML ~~LOC~~ SOLN
6.0000 [IU] | Freq: Three times a day (TID) | SUBCUTANEOUS | Status: DC
  Administered 2011-10-13 (×2): 6 [IU] via SUBCUTANEOUS

## 2011-10-12 MED ORDER — DIPHENHYDRAMINE HCL 50 MG/ML IJ SOLN
INTRAMUSCULAR | Status: AC
  Filled 2011-10-12: qty 1

## 2011-10-12 MED ORDER — MIDAZOLAM HCL 10 MG/2ML IJ SOLN
INTRAMUSCULAR | Status: AC
  Filled 2011-10-12: qty 4

## 2011-10-12 MED ORDER — SODIUM CHLORIDE 0.9 % IJ SOLN
INTRAMUSCULAR | Status: DC | PRN
  Administered 2011-10-12: 13.5 mL

## 2011-10-12 MED ORDER — MIDAZOLAM HCL 10 MG/2ML IJ SOLN
INTRAMUSCULAR | Status: DC | PRN
  Administered 2011-10-12: 1 mg via INTRAVENOUS
  Administered 2011-10-12: 2 mg via INTRAVENOUS
  Administered 2011-10-12 (×2): 1 mg via INTRAVENOUS
  Administered 2011-10-12: 2 mg via INTRAVENOUS
  Administered 2011-10-12: 1 mg via INTRAVENOUS
  Administered 2011-10-12: 2 mg via INTRAVENOUS

## 2011-10-12 MED ORDER — FENTANYL NICU IV SYRINGE 50 MCG/ML
INJECTION | INTRAMUSCULAR | Status: DC | PRN
  Administered 2011-10-12 (×2): 25 ug via INTRAVENOUS
  Administered 2011-10-12 (×2): 12.5 ug via INTRAVENOUS
  Administered 2011-10-12: 25 ug via INTRAVENOUS

## 2011-10-12 MED ORDER — SPOT INK MARKER SYRINGE KIT
PACK | SUBMUCOSAL | Status: AC
  Filled 2011-10-12: qty 5

## 2011-10-12 NOTE — Op Note (Signed)
[facility] [address] [place], [place]  [address]  COLONOSCOPY PROCEDURE REPORT  PATIENT:  [name], [name]  MR#:  [id number] BIRTHDATE:  16-Jun-1945, 65 yrs. old  GENDER:  female ENDOSCOPIST:  Barbette Hair. Arlyce Dice, MD REF. BY:  Butch Penny, M.D. PROCEDURE DATE:  10/12/2011 PROCEDURE:  Colonoscopy with polypectomy and submucosal injection, Colonoscopy with snare polypectomy, Colonoscopy with tumor ablation, Colon with cold biopsy polypectomy ASA CLASS:  Class II INDICATIONS:  Screening h/o streptococcal endocarditis MEDICATIONS:   These medications were titrated to patient response per physician's verbal order, Fentanyl 75 mcg IV, Versed 9 mg IV, glycopyrrolate (Robinal) 0.2 mg IV  DESCRIPTION OF PROCEDURE:   After the risks benefits and alternatives of the procedure were thoroughly explained, informed consent was obtained.  Digital rectal exam was performed and revealed no abnormalities.   The Pentax Colonoscope [id number] and [id number] endoscope was introduced through the anus and advanced to the cecum, which was identified by both the appendix and ileocecal valve, without limitations.  The quality of the prep was excellent, using MoviPrep.  The instrument was then slowly withdrawn as the colon was fully examined. <<PROCEDUREIMAGES>>  FINDINGS:  There were multiple polyps identified and removed. There were 4 sessile polyps in the descending colon/hepatic flexure/proximal transverse colon measuring 1-2 cm. Each polyp was removed following submucosal injection of normal saline, 3-4 cc around and under each polyp. Specimens were sent to the lab. Polyp remnants in the descending colon were fulgurated utilizing the argon plasma coagulator (see image002, image004, image005, image006, image007, and image008). The area of polyposis was injected with "spot" submucosally to identify the area of polyposis  A diminutive polyp was found in the ascending colon.  It was 1 mm in size. The polyp was removed using cold biopsy forceps.  This was otherwise a normal examination of the colon (see image001 and image011). Retroflexed views in the rectum revealed no abnormalities.    The time to cecum =  minutes. The scope was then withdrawn in  1) 40 minutes from the cecum and the procedure completed. COMPLICATIONS:  None ENDOSCOPIC IMPRESSION: 1) Polyps, multiple 2) 1 mm diminutive polyp in the ascending colon 3) Otherwise normal examination RECOMMENDATIONS: 1) Await biopsy results - f/u 6-12 months pending path REPEAT EXAM:  You will receive a letter from Dr. Arlyce Dice in 1-2 weeks, after reviewing the final pathology, with followup recommendations.  ______________________________ Barbette Hair Arlyce Dice, MD  CC:  n. eSIGNED:   Barbette Hair. Tawfiq Favila at 10/12/2011 01:53 PM  Roxy Cedar, [phone]

## 2011-10-12 NOTE — Progress Notes (Signed)
Colo - multiple sessile, large polyps EGD normal. Await pathology results. Hold plavix for 5 days

## 2011-10-12 NOTE — Progress Notes (Signed)
Subjective:  Not feeling well, nothing specific  Objective:  Vital Signs in the last 24 hours: Temp:  [97.3 F (36.3 C)-98.6 F (37 C)] 98.4 F (36.9 C) (05/22 0453) Pulse Rate:  [50-57] 50  (05/22 0453) Resp:  [17-18] 17  (05/22 0453) BP: (138-158)/(52-60) 143/56 mmHg (05/22 0453) SpO2:  [96 %-99 %] 96 % (05/22 0453)  Intake/Output from previous day:  Intake/Output Summary (Last 24 hours) at 10/12/11 1106 Last data filed at 10/11/11 1700  Gross per 24 hour  Intake    960 ml  Output      0 ml  Net    960 ml    Physical Exam: General appearance: alert, cooperative, no distress and mildly obese Lungs: clear to auscultation bilaterally Heart: regular rate and rhythm and 2/6 systolic murmur LSB   Rate: 54  Rhythm: sinus bradycardia  Lab Results:  Basename 10/12/11 0500 10/10/11 0500  WBC 11.8* 13.3*  HGB 11.4* 11.5*  PLT 360 353    Basename 10/12/11 0500 10/10/11 0500  NA 136 135  K 4.0 3.8  CL 101 100  CO2 22 22  GLUCOSE 73 193*  BUN 57* 68*  CREATININE 1.57* 1.53*    Basename 10/11/11 0525 10/10/11 2330  TROPONINI <0.30 <0.30   Hepatic Function Panel No results found for this basename: PROT,ALBUMIN,AST,ALT,ALKPHOS,BILITOT,BILIDIR,IBILI in the last 72 hours  Basename 10/10/11 0500  CHOL 235*   No results found for this basename: INR in the last 72 hours  Imaging: Imaging results have been reviewed  Cardiac Studies:  Assessment/Plan:   Principal Problem:  *Chest pain Active Problems:  HYPOTHYROIDISM  HYPERLIPIDEMIA  HYPERTENSION, history of statin induced myositis  HYPERTENSIVE CARDIOVASCULAR DISEASE  COPD UNSPECIFIED  VERTIGO  Obstructive sleep apnea on nocturnal CPAP  Pulmonary HTN-severe- due to diastolic dysfunction  Streptococcal endocarditis  Diabetes mellitus  RAS (renal artery stenosis), s/p renal artery PTA X 2  Esophageal reflux  Special screening for malignant neoplasms, colon  Normal coronary angiogram Sept 2012, Nl  LVF   Plan- GI w/u in progress. We will be available for any cardiac issues.   [employer] PA-C 10/12/2011, 11:06 AM

## 2011-10-12 NOTE — Interval H&P Note (Signed)
History and Physical Interval Note:  10/12/2011 12:11 PM  [name]  has presented today for surgery, with the diagnosis of chest pain, dyspepsia, screening colonoscopy to look for source of bacterial infection  The various methods of treatment have been discussed with the patient and family. After consideration of risks, benefits and other options for treatment, the patient has consented to  Procedure(s) (LRB): COLONOSCOPY (N/A) ESOPHAGOGASTRODUODENOSCOPY (EGD) (N/A) as a surgical intervention .  The patients' history has been reviewed, patient examined, no change in status, stable for surgery.  I have reviewed the patients' chart and labs.  Questions were answered to the patient's satisfaction.    The recent H&P (dated 10/11/11) was reviewed, the patient was examined and there is no change in the patients condition since that H&P was completed.   [name]  10/12/2011, 12:11 PM    [name]

## 2011-10-12 NOTE — Progress Notes (Signed)
Pt. Seen and examined. Agree with the NP/PA-C note as written.  EGD and colonoscopy results noted - multiple sessile polyps. ?source of endocarditis? Will be available as needed.  Chrystie Nose, MD, [facility] Attending Cardiologist [facility]

## 2011-10-12 NOTE — Progress Notes (Signed)
Subjective: No new issues. No chest pain, since admission.  Objective: Vital signs in last 24 hours: Temp:  [97.3 F (36.3 C)-98.4 F (36.9 C)] 97.7 F (36.5 C) (05/22 1436) Pulse Rate:  [50-66] 53  (05/22 1436) Resp:  [10-30] 18  (05/22 1436) BP: (109-160)/(37-62) 115/50 mmHg (05/22 1436) SpO2:  [96 %-99 %] 96 % (05/22 1436) FiO2 (%):  [98 %] 98 % (05/22 1149) Weight change:  Last BM Date: 10/12/11  Intake/Output from previous day: 05/21 0701 - 05/22 0700 In: 960 [P.O.:960] Out: -      Physical Exam: General: Comfortable, alert, communicative, fully oriented, not short of breath at rest, eating with gusto.   HEENT:  Milod clinical pallor, no jaundice, no conjunctival injection or discharge. Hydration status is fair. NECK:  Supple, JVP not seen, no carotid bruits, no palpable lymphadenopathy, no palpable goiter. CHEST:  Clinically clear to auscultation, no wheezes, no crackles. HEART:  Sounds 1 and 2 heard, normal, regular, no murmurs. ABDOMEN:  Moderate, soft, non-tender, no palpable organomegaly, no palpable masses, normal bowel sounds. GENITALIA:  Not examined. LOWER EXTREMITIES:  No pitting edema, palpable peripheral pulses. MUSCULOSKELETAL SYSTEM:  Generalized osteoarthritic changes, otherwise, normal. CENTRAL NERVOUS SYSTEM:  No focal neurologic deficit on gross examination.  Lab Results:  Basename 10/12/11 0500 10/10/11 0500  WBC 11.8* 13.3*  HGB 11.4* 11.5*  HCT 33.8* 33.9*  PLT 360 353    Basename 10/12/11 0500 10/10/11 0500  NA 136 135  K 4.0 3.8  CL 101 100  CO2 22 22  GLUCOSE 73 193*  BUN 57* 68*  CREATININE 1.57* 1.53*  CALCIUM 9.8 9.3   No results found for this or any previous visit (from the past 240 hour(s)).   Studies/Results: No results found.  Medications: Scheduled Meds:   . amLODipine  10 mg Oral Daily  . aspirin  325 mg Oral Daily  . bisacodyl  5 mg Oral Q8H  . cefTRIAXone (ROCEPHIN) IVPB 2 gram/50 mL D5W (Pyxis)  2 g  Intravenous Q24H  . cloNIDine  0.1 mg Transdermal Weekly  . escitalopram  20 mg Oral Daily  . furosemide  40 mg Oral BID  . heparin  5,000 Units Subcutaneous Q8H  . hydrALAZINE  50 mg Oral Q6H  . insulin aspart  0-15 Units Subcutaneous TID WC  . insulin aspart  0-5 Units Subcutaneous QHS  . insulin aspart  3 Units Subcutaneous TID WC  . insulin glargine  62 Units Subcutaneous QHS  . isosorbide mononitrate  60 mg Oral Daily  . levothyroxine  112 mcg Oral Q breakfast  . nebivolol  2.5 mg Oral Daily  . nitroGLYCERIN  0.5 inch Topical Q6H  . omega-3 acid ethyl esters  2 g Oral BID  . pantoprazole  80 mg Oral Daily  . peg 3350 powder  1 kit Oral Once  . potassium chloride SA  20 mEq Oral BID  . predniSONE  20 mg Oral Q breakfast  . sodium chloride  10-40 mL Intracatheter Q12H  . sodium chloride  3 mL Intravenous Q12H  . spironolactone  25 mg Oral Daily   Continuous Infusions:  PRN Meds:.acetaminophen, acetaminophen, ALPRAZolam, HYDROcodone-acetaminophen, ondansetron (ZOFRAN) IV, ondansetron, sodium chloride, sodium chloride, spot ink marker, DISCONTD: fentaNYL, DISCONTD: glycopyrrolate, DISCONTD: midazolam  Assessment/Plan:  Principal Problem:  *Chest pain: Patient presented with atypical-sounding chest pain. There were no acute ischemic changes on 12 lead EKG, no arrhythmias were recorded on telemetry, and cardiac enzymes remained un-elevated. Patient responded to GI cocktail,  with resolution of symptoms, and no recurrence. Likely, GERD was the culprit. EGD done by Dr [name], who provided GI consultation, on 10/12/11, was a normal study. Active Problems:  1. HYPOTHYROIDISM: Patient is on thyroxine replacement therapy.   2. HYPERLIPIDEMIA: On Omega 3 fatty acid.   HYPERTENSION: This requires multiple antihypertensive medications, but appears controlled on Amlodipine, Clonidine, Hydralazine and Nebivolol.  3. COPD UNSPECIFIED: Stable/asymptomatic.   4. Obstructive sleep apnea on  nocturnal CPAP: Clinically stable.   5. 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   6. Diabetes mellitus: Managing with diet, SSI and scheduled Lantus. Have changed Lantus dose to 65 units daily. Titrating meal cover.   7. Special screening for malignant neoplasms, colon: Patient was scheduled to have colonoscopic screening, to rule out occult GI neoplasm, given etiology of patient's bacterial endocarditis. Colonoscopy on 10/12/11, revealed multiple polyps, including 4 sessile polyps in the descending colon/hepatic  flexure/proximal transverse colon measuring 1-2 cm, which were removed, as was 1 mm diminutive polyp in the ascending colon. Otherwise normal examination. Recommendation is to await biopsy results, with f/u 6-12 months pending pathology.   Comment: aiming possible discharge on 10/13/11. Patient will restart Plavix on 10/17/11.   LOS: 3 days   [name],[name] 10/12/2011, 5:07 PM

## 2011-10-12 NOTE — Op Note (Signed)
[facility] [address] [place], [place]  [address]  ENDOSCOPY PROCEDURE REPORT  PATIENT:  [name], [name]  MR#:  [id number] BIRTHDATE:  11-Sep-1945, 65 yrs. old  GENDER:  female  ENDOSCOPIST:  Barbette Hair. Arlyce Dice, MD Referred by:  Butch Penny, M.D.  PROCEDURE DATE:  10/12/2011 PROCEDURE:  EGD, diagnostic 43235 ASA CLASS:  Class II INDICATIONS:  chest pain  MEDICATIONS:   There was residual sedation effect present from prior procedure., These medications were titrated to patient response per physician's verbal order, Fentanyl 25 mcg IV, Versed 1 mg IV TOPICAL ANESTHETIC:  DESCRIPTION OF PROCEDURE:   After the risks and benefits of the procedure were explained, informed consent was obtained.  The Pentax Gastroscope [id number] endoscope was introduced through the mouth and advanced to the third portion of the duodenum.  The instrument was slowly withdrawn as the mucosa was fully examined. <<PROCEDUREIMAGES>>  The upper, middle, and distal third of the esophagus were carefully inspected and no abnormalities were noted. The z-line was well seen at the GEJ. The endoscope was pushed into the fundus which was normal including a retroflexed view. The antrum,gastric body, first and second part of the duodenum were unremarkable (see image002, image003, and image004).    Retroflexed views revealed no abnormalities.    The scope was then withdrawn from the patient and the procedure completed.  COMPLICATIONS:  None  ENDOSCOPIC IMPRESSION: 1) Normal EGD RECOMMENDATIONS: 1) continue PPI  ______________________________ Barbette Hair. Arlyce Dice, MD  CC:  n. eSIGNED:   Barbette Hair. Falon Flinchum at 10/12/2011 01:56 PM  Roxy Cedar, [phone]

## 2011-10-12 NOTE — Interval H&P Note (Signed)
History and Physical Interval Note:  10/12/2011 11:58 AM  [name]  has presented today for surgery, with the diagnosis of chest pain, dyspepsia, screening colonoscopy to look for source of bacterial infection  The various methods of treatment have been discussed with the patient and family. After consideration of risks, benefits and other options for treatment, the patient has consented to  Procedure(s) (LRB): COLONOSCOPY (N/A) ESOPHAGOGASTRODUODENOSCOPY (EGD) (N/A) as a surgical intervention .  The patients' history has been reviewed, patient examined, no change in status, stable for surgery.  I have reviewed the patients' chart and labs.  Questions were answered to the patient's satisfaction.    The recent H&P (dated *10/11/11**) was reviewed, the patient was examined and there is no change in the patients condition since that H&P was completed.   [name]  10/12/2011, 11:58 AM    [name]

## 2011-10-13 ENCOUNTER — Encounter (HOSPITAL_COMMUNITY): Payer: Self-pay | Admitting: Gastroenterology

## 2011-10-13 DIAGNOSIS — N189 Chronic kidney disease, unspecified: Secondary | ICD-10-CM

## 2011-10-13 DIAGNOSIS — R079 Chest pain, unspecified: Secondary | ICD-10-CM

## 2011-10-13 DIAGNOSIS — E1165 Type 2 diabetes mellitus with hyperglycemia: Secondary | ICD-10-CM

## 2011-10-13 DIAGNOSIS — I330 Acute and subacute infective endocarditis: Secondary | ICD-10-CM

## 2011-10-13 LAB — BASIC METABOLIC PANEL
BUN: 67 mg/dL — ABNORMAL HIGH (ref 6–23)
Chloride: 98 mEq/L (ref 96–112)
GFR calc non Af Amer: 27 mL/min — ABNORMAL LOW (ref >90)
Glucose, Bld: 156 mg/dL — ABNORMAL HIGH (ref 70–99)
Potassium: 4.9 mEq/L (ref 3.5–5.1)
Sodium: 133 mEq/L — ABNORMAL LOW (ref 135–145)

## 2011-10-13 LAB — CBC
HCT: 35.4 % — ABNORMAL LOW (ref 36.0–46.0)
Hemoglobin: 11.7 g/dL — ABNORMAL LOW (ref 12.0–15.0)
RBC: 3.93 MIL/uL (ref 3.87–5.11)
WBC: 14.2 10*3/uL — ABNORMAL HIGH (ref 4.0–10.5)

## 2011-10-13 LAB — GLUCOSE, CAPILLARY: Glucose-Capillary: 161 mg/dL — ABNORMAL HIGH (ref 70–99)

## 2011-10-13 MED ORDER — POTASSIUM CHLORIDE CRYS ER 20 MEQ PO TBCR: 20.0000 meq | EXTENDED_RELEASE_TABLET | Freq: Every day | ORAL | Status: DC

## 2011-10-13 MED ORDER — DEXTROSE 5 % IV SOLN: 2.0000 g | INTRAVENOUS | Status: AC

## 2011-10-13 MED ORDER — FUROSEMIDE 40 MG PO TABS: 40.0000 mg | ORAL_TABLET | Freq: Every day | ORAL | Status: DC

## 2011-10-13 MED ORDER — PANTOPRAZOLE SODIUM 40 MG PO TBEC
40.0000 mg | DELAYED_RELEASE_TABLET | Freq: Every day | ORAL | Status: DC
  Administered 2011-10-13: 40 mg via ORAL
  Filled 2011-10-13: qty 1

## 2011-10-13 MED ORDER — INSULIN GLARGINE 100 UNIT/ML ~~LOC~~ SOLN: 65.0000 [IU] | Freq: Every day | SUBCUTANEOUS | Status: DC

## 2011-10-13 NOTE — Discharge Summary (Signed)
Physician Discharge Summary  Patient ID: [name] MRN: [id number] DOB/AGE: April 15, 1946 66 y.o.  Admit date: 10/09/2011 Discharge date: 10/13/2011  Primary Care Physician:  Anita Reichert, MD, MD Primary Gastroenterologist: Dr [name].  Discharge Diagnoses:    Patient Active Problem List  Diagnoses  . HYPOTHYROIDISM  . HYPERLIPIDEMIA  . OTHER SPECIFIED IDIOPATHIC PERIPHERAL NEUROPATHY  . HYPERTENSION, history of statin induced myositis  . HYPERTENSIVE CARDIOVASCULAR DISEASE  . COPD UNSPECIFIED  . VERTIGO  . Obstructive sleep apnea on nocturnal CPAP  . Leukocytosis  . Acute renal failure  . Elevated troponin  . Pulmonary HTN-severe- due to diastolic dysfunction  . 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  . Nausea with vomiting  . Diarrhea  . Volume depletion, gastrointestinal loss  . Normocytic anemia  . Hyperglycemia  . Streptococcal endocarditis  . Recurrent otitis media  . Chest pain  . Diabetes mellitus  . RAS (renal artery stenosis), s/p renal artery PTA X 2  . Esophageal reflux  . Special screening for malignant neoplasms, colon  . Normal coronary angiogram Sept 2012, Nl LVF  . Benign neoplasm of colon    Medication List  As of 10/13/2011 11:13 AM   TAKE these medications         ALPRAZolam 0.5 MG tablet   Commonly known as: XANAX   Take 0.5 mg by mouth at bedtime as needed. For anxiety      amLODipine 10 MG tablet   Commonly known as: NORVASC   Take 1 tablet (10 mg total) by mouth daily.      aspirin 81 MG tablet   Take 81 mg by mouth daily.      cloNIDine 0.1 mg/24hr patch   Commonly known as: CATAPRES - Dosed in mg/24 hr   Place 1 patch (0.1 mg total) onto the skin once a week.      dextrose 5 % SOLN 50 mL with cefTRIAXone 2 G SOLR 2 g   Inject 2 g into the vein daily.      escitalopram 20 MG tablet   Commonly known as: LEXAPRO   Take 20 mg by mouth daily.      esomeprazole 40 MG capsule   Commonly known as: NEXIUM   Take 40 mg by mouth daily as needed. For reflux      furosemide 40 MG tablet   Commonly known as: LASIX   Take 1 tablet (40 mg total) by mouth daily.      hydrALAZINE 50 MG tablet   Commonly known as: APRESOLINE   Take 1 tablet (50 mg total) by mouth every 6 (six) hours.      insulin glargine 100 UNIT/ML injection   Commonly known as: LANTUS   Inject 65 Units into the skin daily.      insulin lispro 100 UNIT/ML injection   Commonly known as: HUMALOG   Inject 10-20 Units into the skin 3 (three) times daily with meals. 10 units with breakfast, 10 units with lunch, 20 units with dinner      isosorbide mononitrate 60 MG 24 hr tablet   Commonly known as: IMDUR   Take 60 mg by mouth daily.      levothyroxine 112 MCG tablet   Commonly known as: SYNTHROID, LEVOTHROID   Take 112 mcg by mouth daily.      nebivolol 2.5 MG tablet   Commonly known as: BYSTOLIC   Take 2.5 mg by mouth daily.      omega-3 acid ethyl esters  1 G capsule   Commonly known as: LOVAZA   Take 2 g by mouth 2 (two) times daily.      potassium chloride SA 20 MEQ tablet   Commonly known as: K-DUR,KLOR-CON   Take 1 tablet (20 mEq total) by mouth daily.      predniSONE 20 MG tablet   Commonly known as: DELTASONE   Take 1 tablet (20 mg total) by mouth daily with breakfast.      Vitamin D3 2000 UNITS Tabs   Take 1 tablet by mouth at bedtime.             Disposition and Follow-up: Follow up with primary MD, and with Dr [name].  Consults:  GI Dr [name], GI. Dr [name], Cardiologist.  Significant Diagnostic Studies:  Dg Chest Port 1 View  10/09/2011  *RADIOLOGY REPORT*  Clinical Data: Chest pain.  History of pericarditis  PORTABLE CHEST - 1 VIEW  Comparison: Chest radiograph 10/02/2011  Findings: Right PICC line with tip in distal SVC.  Normal cardiac silhouette.  No effusion, infiltrate, or pneumothorax.  IMPRESSION: No acute cardiopulmonary findings.  Original  Report Authenticated By: Genevive Bi, M.D.    Brief H and P: For complete details, refer to admission H and P. 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 Enroute to the hospital she received nitroglycerin which, with resolution of chest pain. She was admitted for further evaluation, investigation and management.  Physical Exam: On 10/13/11. General: Comfortable, alert, communicative, fully oriented, not short of breath at rest, eating with gusto.  HEENT: Milod clinical pallor, no jaundice, no conjunctival injection or discharge. Hydration status is fair.  NECK: Supple, JVP not seen, no carotid bruits, no palpable lymphadenopathy, no palpable goiter.  CHEST: Clinically clear to auscultation, no wheezes, no crackles.  HEART: Sounds 1 and 2 heard, normal, regular, no murmurs.  ABDOMEN: Moderate, soft, non-tender, no palpable organomegaly, no palpable masses, normal bowel sounds.  GENITALIA: Not examined.  LOWER EXTREMITIES: No pitting edema, palpable peripheral pulses.  MUSCULOSKELETAL SYSTEM: Generalized osteoarthritic changes, otherwise, normal.  CENTRAL NERVOUS SYSTEM: No focal neurologic deficit on gross examination.   Hospital Course:  Principal Problem:  *Chest pain: Patient presented with atypical-sounding chest pain. There were no acute ischemic changes on 12 lead EKG, no arrhythmias were recorded on telemetry, and cardiac enzymes remained un-elevated. Patient responded to GI cocktail, with resolution of symptoms, and no recurrence. Likely, GERD was the culprit. EGD done by Dr [name], who provided GI consultation, on 10/12/11, was a normal study. Patient has been reassured accordingly. Active Problems:  1.  HYPOTHYROIDISM: Patient is on thyroxine replacement therapy.  2. HYPERLIPIDEMIA: On Omega 3 fatty acid. Given her known history of Stain-induced myopathy, Statins are not a option. 3. HYPERTENSION: This requires multiple antihypertensive medications, but appeared controlled on Amlodipine, Clonidine, Hydralazine and Nebivolol, during this hospitalizatiopn.  4. COPD UNSPECIFIED: Stable/Asymptomatic.  5. Obstructive sleep apnea on nocturnal CPAP: Clinically stable.  6. 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  7. Diabetes mellitus: Managing with diet, SSI and scheduled Lantus. Have changed Lantus dose to 65 units daily, with reasonable control.  8. Special screening for malignant neoplasms, colon: Patient  was scheduled to have colonoscopic screening, to rule out occult GI neoplasm, given etiology of patient's bacterial endocarditis. Colonoscopy on 10/12/11, revealed multiple polyps, including 4 sessile polyps in the descending colon/hepatic  flexure/proximal transverse colon measuring 1-2 cm, which were removed, as was 1 mm diminutive polyp in the ascending colon. Otherwise normal examination. Pathology from biopsy demonstrated benign findings, ie, tubular adenoma. Dr Arlyce Dice has recommended follow up in 6 months.    Comment: Clinically stable for discharge on 10/13/11. Patient will restart Plavix on 10/17/11.  Time spent on Discharge: 40 mins.  Signed: Gery Sabedra,CHRISTOPHER 10/13/2011, 11:13 AM

## 2011-10-13 NOTE — Progress Notes (Signed)
Polyps are benign.  wouls hold plavix for a total of 5 days. Recommend f/u colonoscopy in 6 months in view of polyposis and difficulty with complete removal.

## 2011-10-13 NOTE — Care Management Note (Signed)
    Page 1 of 1   10/13/2011     9:39:39 AM   CARE MANAGEMENT NOTE 10/13/2011  Patient:  [name], [name]   Account Number:  [id number]  Date Initiated:  10/13/2011  Documentation initiated by:  Avie Arenas  Subjective/Objective Assessment:   Admitted with OBS - r/o - ?? GI for endo and colonoscopy on 10-12-11.  Prior was at home with [facility] IV antibiotics.     Action/Plan:   Anticipated DC Date:  10/13/2011   Anticipated DC Plan:  HOME W HOME HEALTH SERVICES      DC Planning Services  CM consult      Choice offered to / List presented to:        DME agency  [facility]     [facility] arranged  HH-1 RN      Status of service:  In process, will continue to follow Medicare Important Message given?   (If response is "NO", the following Medicare IM given date fields will be blank) Date Medicare IM given:   Date Additional Medicare IM given:    Discharge Disposition:    Per UR Regulation:  Reviewed for med. necessity/level of care/duration of stay  If discussed at Long Length of Stay Meetings, dates discussed:    Comments:  5-23-2013Avie Arenas, RNBSN [phone] Plan for discharge back home with [facility] and IV antibiotics - will need order for [facility] RN and IV antibiotics - dose, times and duration.  Thanks

## 2011-10-13 NOTE — Progress Notes (Signed)
     [facility] Daily Rounding Note 10/13/2011, 8:30 AM  SUBJECTIVE:       No recurrent chest/epigastric pain.  Stools a bit loose.   OBJECTIVE:        General: Looks better, not well     Vital signs in last 24 hours:    Temp:  [97.7 F (36.5 C)-98.5 F (36.9 C)] 98.5 F (36.9 C) (05/23 0219) Pulse Rate:  [50-66] 53  (05/23 0219) Resp:  [10-30] 19  (05/23 0219) BP: (109-160)/(37-62) 150/60 mmHg (05/23 0630) SpO2:  [95 %-99 %] 96 % (05/23 0219) FiO2 (%):  [98 %] 98 % (05/22 1149) Weight:  [173 lb 8 oz (78.7 kg)] 173 lb 8 oz (78.7 kg) (05/23 0219) Last BM Date: 10/12/11  Heart: RRR.  2/6 SEM Chest: clear B.  Not SOB Abdomen: soft, NT, ND.  No masses.  BS active  Extremities: no edema Neuro/Psych:  Pleasant. Cooperative.  No confused or anxious.   Intake/Output from previous day: 05/22 0701 - 05/23 0700 In: 480 [P.O.:480] Out: -   Intake/Output this shift:    Lab Results:  Basename 10/13/11 0546 10/12/11 0500  WBC 14.2* 11.8*  HGB 11.7* 11.4*  HCT 35.4* 33.8*  PLT 375 360   BMET  Basename 10/13/11 0546 10/12/11 0500  NA 133* 136  K 4.9 4.0  CL 98 101  CO2 21 22  GLUCOSE 156* 73  BUN 67* 57*  CREATININE 1.89* 1.57*  CALCIUM 10.1 9.8    ASSESMENT: *  S/P polypectomy, multiple sessile polyps 10/12/11. *  Epigasttic/chest discomfort.  EGD 5/22 normal.   *  Strep gallolyticus blood cultures and endocarditis.  No gross colonoscopic evidence for colon cancer but waiting for polyp pathology.  *  Chronic Plavix for hx renal artery stent (x 2 dueto hx restenosis).  Plan is to restart Plavix 5/27 *  IDDM  PLAN: *  Repeat colonoscopy, timing: 6 to 12 months depending upon the pathology report.  The follow up will actually be with Dr Leone Payor, not Dr Arlyce Dice. Office will contact pt with details and with pathology results.  *  Once daily PPI as per home routine with Nexium.   LOS: 4 days   [name]  10/13/2011, 8:30 AM Pager: [phone]

## 2011-10-13 NOTE — Progress Notes (Signed)
Pt given discharge instructions, medication lists, when to call the doctor, follow up appointments, s/sx infection and HH needs. Pt verbalized understanding of instructions. Thomas Hoff

## 2011-10-18 ENCOUNTER — Telehealth: Payer: Self-pay | Admitting: *Deleted

## 2011-10-18 ENCOUNTER — Other Ambulatory Visit: Payer: BC Managed Care – PPO | Admitting: Internal Medicine

## 2011-10-18 ENCOUNTER — Encounter: Payer: Self-pay | Admitting: Internal Medicine

## 2011-10-18 NOTE — Telephone Encounter (Signed)
ok 

## 2011-10-18 NOTE — Telephone Encounter (Signed)
Wilkie Aye with [facility] called to report the pt c/o chest pressure Sunday. It happened at rest & went away in a few minutes. She got up & was fine. No problems before Sunday & none yesterday. Due for next dose today. Wanted md to be aware. RN's number is [phone]

## 2011-10-18 NOTE — Progress Notes (Signed)
Quick Note:  I have discussed with Dr. Arlyce Dice - 6 month follow-up colonoscopy with me. Do not see a letter yet so I will create one ______

## 2011-10-19 ENCOUNTER — Encounter (HOSPITAL_COMMUNITY): Payer: Self-pay | Admitting: *Deleted

## 2011-10-19 ENCOUNTER — Emergency Department (HOSPITAL_COMMUNITY): Payer: BC Managed Care – PPO

## 2011-10-19 ENCOUNTER — Inpatient Hospital Stay (HOSPITAL_COMMUNITY)
Admission: EM | Admit: 2011-10-19 | Discharge: 2011-10-31 | DRG: 582 | Disposition: A | Discharge status: Home Health | Payer: BC Managed Care – PPO | Attending: Internal Medicine | Admitting: Internal Medicine

## 2011-10-19 DIAGNOSIS — I130 Hypertensive heart and chronic kidney disease with heart failure and stage 1 through stage 4 chronic kidney disease, or unspecified chronic kidney disease: Secondary | ICD-10-CM | POA: Diagnosis present

## 2011-10-19 DIAGNOSIS — E785 Hyperlipidemia, unspecified: Secondary | ICD-10-CM | POA: Diagnosis present

## 2011-10-19 DIAGNOSIS — R0789 Other chest pain: Secondary | ICD-10-CM | POA: Diagnosis present

## 2011-10-19 DIAGNOSIS — Y92009 Unspecified place in unspecified non-institutional (private) residence as the place of occurrence of the external cause: Secondary | ICD-10-CM

## 2011-10-19 DIAGNOSIS — J4489 Other specified chronic obstructive pulmonary disease: Secondary | ICD-10-CM | POA: Diagnosis present

## 2011-10-19 DIAGNOSIS — D62 Acute posthemorrhagic anemia: Secondary | ICD-10-CM | POA: Diagnosis present

## 2011-10-19 DIAGNOSIS — T45515A Adverse effect of anticoagulants, initial encounter: Secondary | ICD-10-CM | POA: Diagnosis present

## 2011-10-19 DIAGNOSIS — I5030 Unspecified diastolic (congestive) heart failure: Secondary | ICD-10-CM | POA: Diagnosis present

## 2011-10-19 DIAGNOSIS — I249 Acute ischemic heart disease, unspecified: Secondary | ICD-10-CM

## 2011-10-19 DIAGNOSIS — Y849 Medical procedure, unspecified as the cause of abnormal reaction of the patient, or of later complication, without mention of misadventure at the time of the procedure: Secondary | ICD-10-CM | POA: Diagnosis not present

## 2011-10-19 DIAGNOSIS — K625 Hemorrhage of anus and rectum: Secondary | ICD-10-CM | POA: Diagnosis present

## 2011-10-19 DIAGNOSIS — I509 Heart failure, unspecified: Secondary | ICD-10-CM | POA: Diagnosis present

## 2011-10-19 DIAGNOSIS — I82629 Acute embolism and thrombosis of deep veins of unspecified upper extremity: Secondary | ICD-10-CM | POA: Diagnosis not present

## 2011-10-19 DIAGNOSIS — IMO0002 Reserved for concepts with insufficient information to code with codable children: Secondary | ICD-10-CM | POA: Diagnosis present

## 2011-10-19 DIAGNOSIS — D126 Benign neoplasm of colon, unspecified: Secondary | ICD-10-CM

## 2011-10-19 DIAGNOSIS — T82898A Other specified complication of vascular prosthetic devices, implants and grafts, initial encounter: Secondary | ICD-10-CM | POA: Diagnosis not present

## 2011-10-19 DIAGNOSIS — R739 Hyperglycemia, unspecified: Secondary | ICD-10-CM

## 2011-10-19 DIAGNOSIS — I119 Hypertensive heart disease without heart failure: Secondary | ICD-10-CM | POA: Diagnosis present

## 2011-10-19 DIAGNOSIS — I701 Atherosclerosis of renal artery: Secondary | ICD-10-CM | POA: Diagnosis not present

## 2011-10-19 DIAGNOSIS — G4733 Obstructive sleep apnea (adult) (pediatric): Secondary | ICD-10-CM | POA: Diagnosis present

## 2011-10-19 DIAGNOSIS — E1129 Type 2 diabetes mellitus with other diabetic kidney complication: Secondary | ICD-10-CM | POA: Diagnosis present

## 2011-10-19 DIAGNOSIS — D72829 Elevated white blood cell count, unspecified: Secondary | ICD-10-CM

## 2011-10-19 DIAGNOSIS — R112 Nausea with vomiting, unspecified: Secondary | ICD-10-CM

## 2011-10-19 DIAGNOSIS — K5730 Diverticulosis of large intestine without perforation or abscess without bleeding: Secondary | ICD-10-CM | POA: Diagnosis present

## 2011-10-19 DIAGNOSIS — B955 Unspecified streptococcus as the cause of diseases classified elsewhere: Secondary | ICD-10-CM

## 2011-10-19 DIAGNOSIS — Z860100 Personal history of colon polyps, unspecified: Secondary | ICD-10-CM

## 2011-10-19 DIAGNOSIS — Z794 Long term (current) use of insulin: Secondary | ICD-10-CM

## 2011-10-19 DIAGNOSIS — E039 Hypothyroidism, unspecified: Secondary | ICD-10-CM | POA: Diagnosis present

## 2011-10-19 DIAGNOSIS — M609 Myositis, unspecified: Secondary | ICD-10-CM

## 2011-10-19 DIAGNOSIS — R197 Diarrhea, unspecified: Secondary | ICD-10-CM

## 2011-10-19 DIAGNOSIS — G608 Other hereditary and idiopathic neuropathies: Secondary | ICD-10-CM

## 2011-10-19 DIAGNOSIS — H6690 Otitis media, unspecified, unspecified ear: Secondary | ICD-10-CM

## 2011-10-19 DIAGNOSIS — I330 Acute and subacute infective endocarditis: Secondary | ICD-10-CM | POA: Diagnosis present

## 2011-10-19 DIAGNOSIS — IMO0001 Reserved for inherently not codable concepts without codable children: Secondary | ICD-10-CM

## 2011-10-19 DIAGNOSIS — N183 Chronic kidney disease, stage 3 unspecified: Secondary | ICD-10-CM | POA: Diagnosis present

## 2011-10-19 DIAGNOSIS — D696 Thrombocytopenia, unspecified: Secondary | ICD-10-CM | POA: Diagnosis present

## 2011-10-19 DIAGNOSIS — K219 Gastro-esophageal reflux disease without esophagitis: Secondary | ICD-10-CM

## 2011-10-19 DIAGNOSIS — I2510 Atherosclerotic heart disease of native coronary artery without angina pectoris: Secondary | ICD-10-CM | POA: Diagnosis present

## 2011-10-19 DIAGNOSIS — G609 Hereditary and idiopathic neuropathy, unspecified: Secondary | ICD-10-CM | POA: Diagnosis present

## 2011-10-19 DIAGNOSIS — R7989 Other specified abnormal findings of blood chemistry: Secondary | ICD-10-CM | POA: Diagnosis present

## 2011-10-19 DIAGNOSIS — Z8249 Family history of ischemic heart disease and other diseases of the circulatory system: Secondary | ICD-10-CM

## 2011-10-19 DIAGNOSIS — Y838 Other surgical procedures as the cause of abnormal reaction of the patient, or of later complication, without mention of misadventure at the time of the procedure: Secondary | ICD-10-CM | POA: Diagnosis present

## 2011-10-19 DIAGNOSIS — M6281 Muscle weakness (generalized): Secondary | ICD-10-CM

## 2011-10-19 DIAGNOSIS — R748 Abnormal levels of other serum enzymes: Secondary | ICD-10-CM | POA: Diagnosis present

## 2011-10-19 DIAGNOSIS — J449 Chronic obstructive pulmonary disease, unspecified: Secondary | ICD-10-CM | POA: Diagnosis present

## 2011-10-19 DIAGNOSIS — D649 Anemia, unspecified: Secondary | ICD-10-CM

## 2011-10-19 DIAGNOSIS — Z885 Allergy status to narcotic agent status: Secondary | ICD-10-CM

## 2011-10-19 DIAGNOSIS — G4730 Sleep apnea, unspecified: Secondary | ICD-10-CM | POA: Diagnosis present

## 2011-10-19 DIAGNOSIS — Z7982 Long term (current) use of aspirin: Secondary | ICD-10-CM

## 2011-10-19 DIAGNOSIS — T466X5A Adverse effect of antihyperlipidemic and antiarteriosclerotic drugs, initial encounter: Secondary | ICD-10-CM | POA: Diagnosis present

## 2011-10-19 DIAGNOSIS — I2789 Other specified pulmonary heart diseases: Secondary | ICD-10-CM | POA: Diagnosis present

## 2011-10-19 DIAGNOSIS — I272 Pulmonary hypertension, unspecified: Secondary | ICD-10-CM | POA: Diagnosis present

## 2011-10-19 DIAGNOSIS — E119 Type 2 diabetes mellitus without complications: Secondary | ICD-10-CM

## 2011-10-19 DIAGNOSIS — I200 Unstable angina: Secondary | ICD-10-CM | POA: Diagnosis present

## 2011-10-19 DIAGNOSIS — Z880 Allergy status to penicillin: Secondary | ICD-10-CM

## 2011-10-19 DIAGNOSIS — R079 Chest pain, unspecified: Secondary | ICD-10-CM | POA: Diagnosis present

## 2011-10-19 DIAGNOSIS — R55 Syncope and collapse: Secondary | ICD-10-CM | POA: Diagnosis present

## 2011-10-19 DIAGNOSIS — Z888 Allergy status to other drugs, medicaments and biological substances status: Secondary | ICD-10-CM

## 2011-10-19 DIAGNOSIS — Z1211 Encounter for screening for malignant neoplasm of colon: Secondary | ICD-10-CM

## 2011-10-19 DIAGNOSIS — N179 Acute kidney failure, unspecified: Secondary | ICD-10-CM

## 2011-10-19 DIAGNOSIS — R42 Dizziness and giddiness: Secondary | ICD-10-CM

## 2011-10-19 DIAGNOSIS — A491 Streptococcal infection, unspecified site: Secondary | ICD-10-CM | POA: Diagnosis present

## 2011-10-19 DIAGNOSIS — E869 Volume depletion, unspecified: Secondary | ICD-10-CM

## 2011-10-19 DIAGNOSIS — K633 Ulcer of intestine: Secondary | ICD-10-CM | POA: Diagnosis present

## 2011-10-19 HISTORY — DX: Acute myocardial infarction, unspecified: I21.9

## 2011-10-19 HISTORY — DX: Atherosclerotic heart disease of native coronary artery without angina pectoris: I25.10

## 2011-10-19 HISTORY — DX: Disease of pericardium, unspecified: I31.9

## 2011-10-19 HISTORY — DX: Unstable angina: I20.0

## 2011-10-19 LAB — DIFFERENTIAL
Basophils Absolute: 0 10*3/uL (ref 0.0–0.1)
Lymphocytes Relative: 9 % — ABNORMAL LOW (ref 12–46)
Neutro Abs: 7.4 10*3/uL (ref 1.7–7.7)
Neutrophils Relative %: 86 % — ABNORMAL HIGH (ref 43–77)

## 2011-10-19 LAB — MRSA PCR SCREENING: MRSA by PCR: NEGATIVE

## 2011-10-19 LAB — BASIC METABOLIC PANEL
CO2: 20 mEq/L (ref 19–32)
Calcium: 8.9 mg/dL (ref 8.4–10.5)
Chloride: 101 mEq/L (ref 96–112)
Sodium: 136 mEq/L (ref 135–145)

## 2011-10-19 LAB — CARDIAC PANEL(CRET KIN+CKTOT+MB+TROPI)
CK, MB: 11.3 ng/mL (ref 0.3–4.0)
Relative Index: 6.5 — ABNORMAL HIGH (ref 0.0–2.5)
Total CK: 174 U/L (ref 7–177)

## 2011-10-19 LAB — CBC
Platelets: 207 10*3/uL (ref 150–400)
RDW: 13.7 % (ref 11.5–15.5)
WBC: 8.7 10*3/uL (ref 4.0–10.5)

## 2011-10-19 LAB — D-DIMER, QUANTITATIVE: D-Dimer, Quant: 1.51 ug/mL-FEU — ABNORMAL HIGH (ref 0.00–0.48)

## 2011-10-19 LAB — PROTIME-INR: Prothrombin Time: 13.6 seconds (ref 11.6–15.2)

## 2011-10-19 LAB — POCT I-STAT TROPONIN I

## 2011-10-19 LAB — GLUCOSE, CAPILLARY: Glucose-Capillary: 102 mg/dL — ABNORMAL HIGH (ref 70–99)

## 2011-10-19 MED ORDER — ESCITALOPRAM OXALATE 20 MG PO TABS
20.0000 mg | ORAL_TABLET | Freq: Every day | ORAL | Status: DC
  Administered 2011-10-20 – 2011-10-24 (×5): 20 mg via ORAL
  Filled 2011-10-19 (×6): qty 1

## 2011-10-19 MED ORDER — ALPRAZOLAM 0.5 MG PO TABS
0.5000 mg | ORAL_TABLET | Freq: Every evening | ORAL | Status: DC | PRN
  Administered 2011-10-19 – 2011-10-29 (×7): 0.5 mg via ORAL
  Filled 2011-10-19 (×8): qty 1

## 2011-10-19 MED ORDER — ACETAMINOPHEN 325 MG PO TABS
650.0000 mg | ORAL_TABLET | ORAL | Status: DC | PRN
  Administered 2011-10-20 (×2): 650 mg via ORAL
  Filled 2011-10-19 (×2): qty 2

## 2011-10-19 MED ORDER — HEPARIN BOLUS VIA INFUSION
4000.0000 [IU] | Freq: Once | INTRAVENOUS | Status: AC
  Administered 2011-10-19: 4000 [IU] via INTRAVENOUS

## 2011-10-19 MED ORDER — ASPIRIN EC 81 MG PO TBEC
81.0000 mg | DELAYED_RELEASE_TABLET | Freq: Every day | ORAL | Status: DC
  Administered 2011-10-20 – 2011-10-24 (×4): 81 mg via ORAL
  Filled 2011-10-19 (×6): qty 1

## 2011-10-19 MED ORDER — INSULIN GLARGINE 100 UNIT/ML ~~LOC~~ SOLN: 65.0000 [IU] | Freq: Every day | SUBCUTANEOUS | Status: DC

## 2011-10-19 MED ORDER — HYDRALAZINE HCL 50 MG PO TABS
50.0000 mg | ORAL_TABLET | Freq: Four times a day (QID) | ORAL | Status: DC
  Filled 2011-10-19 (×2): qty 1

## 2011-10-19 MED ORDER — ASPIRIN 300 MG RE SUPP: 300.0000 mg | RECTAL | Status: AC

## 2011-10-19 MED ORDER — ASPIRIN 81 MG PO CHEW: 324.0000 mg | CHEWABLE_TABLET | ORAL | Status: AC

## 2011-10-19 MED ORDER — SODIUM CHLORIDE 0.9 % IV SOLN
INTRAVENOUS | Status: DC
  Administered 2011-10-19 – 2011-10-21 (×2): via INTRAVENOUS
  Administered 2011-10-21: 75 mL/h via INTRAVENOUS
  Administered 2011-10-22 – 2011-10-24 (×3): via INTRAVENOUS
  Administered 2011-10-25: 50 mL/h via INTRAVENOUS

## 2011-10-19 MED ORDER — DEXTROSE 5 % IV SOLN
2.0000 g | INTRAVENOUS | Status: DC
  Administered 2011-10-20 – 2011-10-30 (×11): 2 g via INTRAVENOUS
  Filled 2011-10-19 (×12): qty 2

## 2011-10-19 MED ORDER — ONDANSETRON HCL 4 MG/2ML IJ SOLN
INTRAMUSCULAR | Status: AC
  Administered 2011-10-19: 4 mg via INTRAVENOUS
  Filled 2011-10-19: qty 2

## 2011-10-19 MED ORDER — ONDANSETRON HCL 4 MG/2ML IJ SOLN
4.0000 mg | Freq: Once | INTRAMUSCULAR | Status: AC
  Administered 2011-10-19: 4 mg via INTRAVENOUS

## 2011-10-19 MED ORDER — POTASSIUM CHLORIDE CRYS ER 20 MEQ PO TBCR
20.0000 meq | EXTENDED_RELEASE_TABLET | Freq: Every day | ORAL | Status: DC
  Administered 2011-10-20: 20 meq via ORAL
  Filled 2011-10-19 (×2): qty 1

## 2011-10-19 MED ORDER — POTASSIUM CHLORIDE CRYS ER 20 MEQ PO TBCR: 20.0000 meq | EXTENDED_RELEASE_TABLET | Freq: Every day | ORAL | Status: DC

## 2011-10-19 MED ORDER — OMEGA-3-ACID ETHYL ESTERS 1 G PO CAPS
2.0000 g | ORAL_CAPSULE | Freq: Two times a day (BID) | ORAL | Status: DC
  Administered 2011-10-19 – 2011-10-30 (×20): 2 g via ORAL
  Filled 2011-10-19 (×24): qty 2

## 2011-10-19 MED ORDER — FUROSEMIDE 40 MG PO TABS
40.0000 mg | ORAL_TABLET | Freq: Every day | ORAL | Status: DC
  Filled 2011-10-19 (×2): qty 1

## 2011-10-19 MED ORDER — INSULIN ASPART 100 UNIT/ML ~~LOC~~ SOLN
5.0000 [IU] | Freq: Three times a day (TID) | SUBCUTANEOUS | Status: DC
  Administered 2011-10-20 – 2011-10-24 (×7): 5 [IU] via SUBCUTANEOUS

## 2011-10-19 MED ORDER — NITROGLYCERIN 2 % TD OINT: 1.0000 [in_us] | TOPICAL_OINTMENT | Freq: Once | TRANSDERMAL | Status: DC

## 2011-10-19 MED ORDER — NEBIVOLOL HCL 2.5 MG PO TABS
2.5000 mg | ORAL_TABLET | Freq: Every day | ORAL | Status: DC
  Administered 2011-10-20 – 2011-10-24 (×5): 2.5 mg via ORAL
  Filled 2011-10-19 (×6): qty 1

## 2011-10-19 MED ORDER — PANTOPRAZOLE SODIUM 40 MG PO TBEC: 40.0000 mg | DELAYED_RELEASE_TABLET | Freq: Every day | ORAL | Status: DC

## 2011-10-19 MED ORDER — ASPIRIN EC 81 MG PO TBEC: 81.0000 mg | DELAYED_RELEASE_TABLET | Freq: Every day | ORAL | Status: DC

## 2011-10-19 MED ORDER — GI COCKTAIL ~~LOC~~: 30.0000 mL | Freq: Once | ORAL | Status: DC

## 2011-10-19 MED ORDER — DEXTROSE 5 % IV SOLN
2.0000 g | INTRAVENOUS | Status: AC
  Administered 2011-10-19: 2 g via INTRAVENOUS
  Filled 2011-10-19: qty 2

## 2011-10-19 MED ORDER — PANTOPRAZOLE SODIUM 40 MG PO TBEC
40.0000 mg | DELAYED_RELEASE_TABLET | Freq: Every day | ORAL | Status: DC
  Administered 2011-10-20 – 2011-10-30 (×9): 40 mg via ORAL
  Filled 2011-10-19 (×8): qty 1

## 2011-10-19 MED ORDER — FUROSEMIDE 40 MG PO TABS: 40.0000 mg | ORAL_TABLET | Freq: Every day | ORAL | Status: DC

## 2011-10-19 MED ORDER — NITROGLYCERIN IN D5W 200-5 MCG/ML-% IV SOLN
10.0000 ug/min | INTRAVENOUS | Status: DC
  Administered 2011-10-19: 2 ug/min via INTRAVENOUS

## 2011-10-19 MED ORDER — INSULIN ASPART 100 UNIT/ML ~~LOC~~ SOLN
0.0000 [IU] | Freq: Three times a day (TID) | SUBCUTANEOUS | Status: DC
  Administered 2011-10-20: 2 [IU] via SUBCUTANEOUS
  Administered 2011-10-20: 1 [IU] via SUBCUTANEOUS
  Administered 2011-10-21 (×3): 3 [IU] via SUBCUTANEOUS
  Administered 2011-10-22: 1 [IU] via SUBCUTANEOUS
  Administered 2011-10-22: 5 [IU] via SUBCUTANEOUS
  Administered 2011-10-22 – 2011-10-23 (×2): 3 [IU] via SUBCUTANEOUS
  Administered 2011-10-23 (×2): 2 [IU] via SUBCUTANEOUS
  Administered 2011-10-24: 7 [IU] via SUBCUTANEOUS
  Administered 2011-10-24: 1 [IU] via SUBCUTANEOUS
  Administered 2011-10-24: 2 [IU] via SUBCUTANEOUS

## 2011-10-19 MED ORDER — PREDNISONE 20 MG PO TABS
20.0000 mg | ORAL_TABLET | Freq: Every day | ORAL | Status: DC
  Administered 2011-10-20 – 2011-10-24 (×5): 20 mg via ORAL
  Filled 2011-10-19 (×7): qty 1

## 2011-10-19 MED ORDER — NITROGLYCERIN IN D5W 200-5 MCG/ML-% IV SOLN
2.0000 ug/min | Freq: Once | INTRAVENOUS | Status: AC
  Administered 2011-10-19: 2 ug/min via INTRAVENOUS
  Filled 2011-10-19: qty 250

## 2011-10-19 MED ORDER — ONDANSETRON HCL 4 MG/2ML IJ SOLN
4.0000 mg | Freq: Four times a day (QID) | INTRAMUSCULAR | Status: DC | PRN
  Administered 2011-10-20 (×2): 4 mg via INTRAVENOUS
  Filled 2011-10-19 (×2): qty 2

## 2011-10-19 MED ORDER — AMLODIPINE BESYLATE 10 MG PO TABS
10.0000 mg | ORAL_TABLET | Freq: Every day | ORAL | Status: DC
  Administered 2011-10-21 – 2011-10-24 (×4): 10 mg via ORAL
  Filled 2011-10-19 (×6): qty 1

## 2011-10-19 MED ORDER — HEPARIN (PORCINE) IN NACL 100-0.45 UNIT/ML-% IJ SOLN
900.0000 [IU]/h | INTRAMUSCULAR | Status: DC
  Administered 2011-10-19 (×2): 900 [IU]/h via INTRAVENOUS
  Filled 2011-10-19: qty 250

## 2011-10-19 MED ORDER — LEVOTHYROXINE SODIUM 112 MCG PO TABS
112.0000 ug | ORAL_TABLET | Freq: Every day | ORAL | Status: DC
  Administered 2011-10-20 – 2011-10-24 (×5): 112 ug via ORAL
  Filled 2011-10-19 (×9): qty 1

## 2011-10-19 NOTE — ED Notes (Signed)
MD Rancoir aware of Cardiac markers.

## 2011-10-19 NOTE — ED Notes (Signed)
Rocephin ordered from pharmacy.

## 2011-10-19 NOTE — ED Notes (Signed)
Heparin ordered from pharmacy 

## 2011-10-19 NOTE — ED Notes (Signed)
Cardiology MD at bedside.

## 2011-10-19 NOTE — ED Provider Notes (Signed)
History     CSN: [id number]  Arrival date & time 10/19/11  1408   First MD Initiated Contact with Patient 10/19/11 1454      Chief Complaint  Patient presents with  . Chest Pain  . Shortness of Breath    (Consider location/radiation/quality/duration/timing/severity/associated sxs/prior treatment) HPI Comments: Patient presents with left-sided chest pressure, shortness of breath, nausea vomiting diaphoresis since this morning. She states her pain has been constant but improving with nitroglycerin throughout the day. She's had this pain in the past most recently in May 19 and she was admitted to the hospital. She was ruled out with serial cardiac enzymes. She had a negative stress test on May 10 and a clean cardiac catheterization in the fall 2012. She states this pain is similar to what she had last week. 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 She had a negative GI workup during her last admission as well including negative EGD and colonoscopy with polyps. She denies any fevers, abdominal pain, headache or back pain.  The history is provided by the patient.    Past Medical History  Diagnosis Date  . HTN (hypertension)   . Elevated troponin level 09/28/11    Type II MI - not ACS related  . Diastolic dysfunction, left ventricle     with previous Diastolic HF  . Diabetes mellitus   . High cholesterol   . Kidney disease   . Cataracts, bilateral   . 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 Pulmonary HTN-severe- due to diastolic dysfunction 09/29/2011    Moderate to Severe Pulm HTN.  Marland Kitchen Acute renal failure 09/29/2011  . 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 HYPOTHYROIDISM 06/19/2008    Qualifier: Diagnosis of  By: Excell Seltzer CMA, Lawson Fiscal    . Renal artery stenosis     s/p stents (restenosis)  . Subclavian arterial stenosis   . Peripheral neuropathy   . Obstructive  sleep apnea on nocturnal CPAP 06/20/2008    Qualifier: Diagnosis of  By: Vernie Murders    . Unstable angina 10/19/2011  . Coronary artery disease   . Myocardial infarction   . Pericarditis     Past Surgical History  Procedure Date  . Cardiac catheterization 01/2011    Minimal luminal irregularties  . Laparoscopic tubal ligation   . Renal artery stent     x 2 (restenosis)  . Colonoscopy 10/12/2011    Procedure: COLONOSCOPY;  Surgeon: Louis Meckel, MD;  Location: [facility] ENDOSCOPY;  Service: Endoscopy;  Laterality: N/A;  . Esophagogastroduodenoscopy 10/12/2011    Procedure: ESOPHAGOGASTRODUODENOSCOPY (EGD);  Surgeon: Louis Meckel, MD;  Location: [facility] ENDOSCOPY;  Service: Endoscopy;  Laterality: N/A;  . Tubal ligation     Family History  Problem Relation Age of Onset  . Heart disease Mother   . Heart attack Mother   . Heart disease Father   . Heart attack Father   . Heart disease Sister   . Heart disease Daughter     History  Substance Use Topics  . Smoking status: Former Smoker -- 1.0 packs/day for 25 years    Types: Cigarettes    Quit date: 05/23/2004  . Smokeless tobacco: Not on file  . Alcohol Use: No    OB History    Grav Para Term Preterm Abortions TAB SAB Ect Mult Living                  Review of Systems  Constitutional: Positive for chills, diaphoresis, activity change and appetite change. Negative for fever.  Respiratory: Positive for chest tightness and shortness of breath.   Cardiovascular: Positive for chest pain.  Gastrointestinal: Positive for nausea and vomiting. Negative for abdominal pain.  Genitourinary: Negative for dysuria.  Neurological: Negative for dizziness.    Allergies  Atenolol; Bystolic; Ciprofloxacin; Codeine; Lidocaine; Metformin; Metoprolol; and Penicillins  Home Medications   No current outpatient prescriptions on file.  BP 104/31  Pulse 77  Temp(Src) 99.8 F (37.7 C) (Oral)  Resp 24  Ht 5\' 6"  (1.676 m)  Wt 178 lb 2.1 oz  (80.8 kg)  BMI 28.75 kg/m2  SpO2 100%  Physical Exam  Constitutional: She is oriented to person, place, and time. She appears well-developed and well-nourished. No distress.  HENT:  Head: Normocephalic and atraumatic.  Mouth/Throat: Oropharynx is clear and moist. No oropharyngeal exudate.  Eyes: Conjunctivae are normal. Pupils are equal, round, and reactive to light.  Neck: Normal range of motion. Neck supple.  Cardiovascular: Normal rate, regular rhythm and normal heart sounds.   Pulmonary/Chest: Effort normal and breath sounds normal. No respiratory distress. She exhibits no tenderness.  Abdominal: Soft. There is no tenderness. There is no rebound and no guarding.  Musculoskeletal: Normal range of motion.       RUE PICC  Neurological: She is alert and oriented to person, place, and time. No cranial nerve deficit.  Skin: Skin is warm.    ED Course  Procedures (including critical care time)  Labs Reviewed  CBC - Abnormal; Notable for the following:    RBC 3.37 (*)    Hemoglobin 10.1 (*)    HCT 29.9 (*)    All other components within normal limits  DIFFERENTIAL - Abnormal; Notable for the following:    Neutrophils Relative 86 (*)    Lymphocytes Relative 9 (*)    All other components within normal limits  BASIC METABOLIC PANEL - Abnormal; Notable for the following:    BUN 49 (*)    Creatinine, Ser 1.43 (*)    GFR calc non Af Amer 38 (*)    GFR calc Af Amer 44 (*)    All other components within normal limits  CARDIAC PANEL(CRET KIN+CKTOT+MB+TROPI) - Abnormal; Notable for the following:    CK, MB 11.3 (*)    Troponin I 0.36 (*)    Relative Index 6.5 (*)    All other components within normal limits  POCT I-STAT TROPONIN I - Abnormal; Notable for the following:    Troponin i, poc 0.29 (*)    All other components within normal limits  HEPARIN LEVEL (UNFRACTIONATED) - Abnormal; Notable for the following:    Heparin Unfractionated 0.87 (*)    All other components within normal  limits  CBC - Abnormal; Notable for the following:    WBC 11.6 (*)    RBC 3.34 (*)    Hemoglobin 10.0 (*)    HCT 29.5 (*)    All other components within normal limits  CARDIAC PANEL(CRET KIN+CKTOT+MB+TROPI) - Abnormal; Notable for the following:    CK, MB 13.3 (*) CRITICAL VALUE NOTED.  VALUE IS CONSISTENT WITH PREVIOUSLY REPORTED AND CALLED VALUE.   Troponin I 0.33 (*)    Relative Index 8.1 (*)    All other components within normal limits  BASIC METABOLIC PANEL - Abnormal; Notable for the following:    BUN 46 (*)    Creatinine, Ser 1.50 (*)    GFR calc non Af Amer 36 (*)  GFR calc Af Amer 42 (*)    All other components within normal limits  D-DIMER, QUANTITATIVE - Abnormal; Notable for the following:    D-Dimer, Quant 1.51 (*)    All other components within normal limits  CARDIAC PANEL(CRET KIN+CKTOT+MB+TROPI) - Abnormal; Notable for the following:    CK, MB 11.9 (*) CRITICAL VALUE NOTED.  VALUE IS CONSISTENT WITH PREVIOUSLY REPORTED AND CALLED VALUE.   Troponin I 0.31 (*)    Relative Index 6.8 (*)    All other components within normal limits  GLUCOSE, CAPILLARY - Abnormal; Notable for the following:    Glucose-Capillary 102 (*)    All other components within normal limits  LIPID PANEL - Abnormal; Notable for the following:    Triglycerides 170 (*)    All other components within normal limits  CBC - Abnormal; Notable for the following:    RBC 2.95 (*)    Hemoglobin 9.0 (*)    HCT 26.2 (*)    All other components within normal limits  GLUCOSE, CAPILLARY - Abnormal; Notable for the following:    Glucose-Capillary 122 (*)    All other components within normal limits  PROTIME-INR  PROTIME-INR  MAGNESIUM  MRSA PCR SCREENING  HEPARIN LEVEL (UNFRACTIONATED)  GLUCOSE, CAPILLARY  TYPE AND SCREEN  PREPARE RBC (CROSSMATCH)  GLUCOSE, CAPILLARY  ABO/RH  CULTURE, BLOOD (ROUTINE X 2)  CULTURE, BLOOD (ROUTINE X 2)  CARDIAC PANEL(CRET KIN+CKTOT+MB+TROPI)   Dg Chest 2  View  10/19/2011  *RADIOLOGY REPORT*  Clinical Data: Shortness of breath and chest pain  CHEST - 2 VIEW  Comparison: 10/09/2011  Findings: There is a right arm PICC line with tip in the SVC.  The heart size and mediastinal contours are within normal limits.  Both lungs are clear.  The visualized skeletal structures are unremarkable.  IMPRESSION: Negative examination.  Original Report Authenticated By: Rosealee Albee, M.D.     1. Acute coronary syndrome   2. Chest pain       MDM  Substernal chest pressure with shortness of breath, nausea diaphoresis. Recent admission for same with recent negative stress test. However new ST depressions in EKG. Currently on rocephin for endocarditis.  Elevated troponin with concerning new EKG changes. Patient already received ASA. NTG begun.  Caution with heparin in setting of polypectomy 1 week ago. D/w [facility] who plan to admit to stepdown unit.    Date: 10/19/2011  Rate: 65  Rhythm: normal sinus rhythm  QRS Axis: normal  Intervals: QT prolonged  ST/T Wave abnormalities: ST elevations inferiorly and ST depressions laterally  Conduction Disutrbances:none  Narrative Interpretation:   Old EKG Reviewed: changes noted  CRITICAL CARE Performed by: Glynn Octave   Total critical care time: 30  Critical care time was exclusive of separately billable procedures and treating other patients.  Critical care was necessary to treat or prevent imminent or life-threatening deterioration.  Critical care was time spent personally by me on the following activities: development of treatment plan with patient and/or surrogate as well as nursing, discussions with consultants, evaluation of patient's response to treatment, examination of patient, obtaining history from patient or surrogate, ordering and performing treatments and interventions, ordering and review of laboratory studies, ordering and review of radiographic studies, pulse oximetry and re-evaluation of  patient's condition.\         Glynn Octave, MD 10/20/11 1229

## 2011-10-19 NOTE — Progress Notes (Signed)
ANTICOAGULATION CONSULT NOTE - Initial Consult  Pharmacy Consult for [facility] Indication: NSTEMI  Allergies  Allergen Reactions  . Atenolol     unknown  . Bystolic (Nebivolol Hcl)     Cannot tolerate greater then 5 mg, causes chest pain  . Ciprofloxacin     unknown  . Codeine     swelling  . Lidocaine     Throat swelling  . Metformin     vomiting  . Metoprolol     unknown  . Penicillins     rash    Patient Measurements: Height: 5' 2.99" (160 cm) Weight: 173 lb 8 oz (78.7 kg) IBW/kg (Calculated) : 52.38  Heparin Dosing Weight: 67kg  Vital Signs: Temp: 98.4 F (36.9 C) (05/29 1424) Temp src: Oral (05/29 1424) BP: 127/41 mmHg (05/29 1534) Pulse Rate: 60  (05/29 1534)  Labs:  Basename 10/19/11 1506  HGB 10.1*  HCT 29.9*  PLT 207  APTT --  LABPROT --  INR --  HEPARINUNFRC --  CREATININE --  CKTOTAL --  CKMB --  TROPONINI --    Estimated Creatinine Clearance: 30.3 ml/min (by C-G formula based on Cr of 1.89).   Medical History: Past Medical History  Diagnosis Date  . HTN (hypertension)   . Elevated troponin level 09/28/11    Type II MI - not ACS related  . Diastolic dysfunction, left ventricle     with previous Diastolic HF  . Diabetes mellitus   . High cholesterol   . Kidney disease   . Cataracts, bilateral   . 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 Pulmonary HTN-severe- due to diastolic dysfunction 09/29/2011    Moderate to Severe Pulm HTN.  Marland Kitchen Acute renal failure 09/29/2011  . 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 HYPOTHYROIDISM 06/19/2008    Qualifier: Diagnosis of  By: Excell Seltzer CMA, Lawson Fiscal    . Renal artery stenosis     s/p stents (restenosis)  . Subclavian arterial stenosis   . Peripheral neuropathy   . Obstructive sleep apnea on nocturnal CPAP 06/20/2008    Qualifier: Diagnosis of  By: Vernie Murders      Medications:   (Not in a hospital admission)  Assessment: 66  y/o female patient admitted with chest pain, positive troponins, requiring anticoagulation for NSTEMI.  Goal of Therapy:  Heparin level 0.3-0.7 units/ml Monitor platelets by anticoagulation protocol: Yes   Plan:  Heparin 4000 unit IV bolus followed by infusion at 900 units/hr. Check 6 hour heparin level with daily cbc and heparin level.  Verlene Mayer, PharmD, BCPS Pager [phone] 10/19/2011,4:06 PM

## 2011-10-19 NOTE — ED Notes (Signed)
Per EMS pt from home with c/o chest pressure, shortness of breath, nausea/vomiting, diaphoresis since this am. Alert and oriented x 3. VSS. BG 79. IV 20G L Hand. Pt has hx of MI x 2- no stent placement.

## 2011-10-19 NOTE — ED Notes (Signed)
Pt received aspirin 324 mg in route. 1 nitro SL given in route. Pt refused more nitro after first dose.

## 2011-10-19 NOTE — Progress Notes (Signed)
Pt. Decided to sleep without cpap tonight. Pt. Stated she will notify if she decides to wear it.

## 2011-10-19 NOTE — H&P (Signed)
[name] is an 66 y.o. female.   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  Since her discharge on the 23rd of May.  She has continued with chest pain, almost daily, by yesterday she had increasing SOB, along with the pain.  Last PM she was instructed to come to the ER if pain continued but she did not.  By 0900 today pain had increased and she was SOB, diaphoretic, with nausea and vomiting.  Pain was 8/10 but now here in ER pain is resolved.   EKG  Is abnormal with new lat ischemia in her lateral leads.  Troponin is also elevated at 0.29, now 0.36 with elevated MB.  She has a history of noncritical coronary artery disease cardiac catheterization in November 2010. Re-cardiac catheterization in September 2012 which revealed normal coronary arteries and normal LV function. A 2-D echocardiogram which showed diastolic dysfunction. History also includes peripheral vascular obstructive disease with history of renal artery intervention and with reintervention iCAST covered stents for in-stent restenosis. She also has subclavian disease. History also includes difficult to control hypertension, pulmonary HTN, hyperlipidemia, insulin-dependent diabetes, chronic kidney disease, hypothyroidism, obstructive sleep apnea for which she uses a CPAP. Her last renal Dopplers revealed a stent to be widely patent.   Her original admit for endocarditis 09/28/11 she did have + cardiac enzymes with pk troponin of 1.01.  Nuc. Study with that admit revealed:                      Ejection fraction: 61%. End diastolic volume 93 ml. End-systolic volume 37 ml. No perfusion defects.   EGD was done that was normal and colonoscopy with polyps.  She will have followup in 6 months.  Past Medical History  Diagnosis Date  . HTN (hypertension)   . Elevated troponin level 09/28/11    Type II MI - not ACS  related  . Diastolic dysfunction, left ventricle     with previous Diastolic HF  . Diabetes mellitus   . High cholesterol   . Kidney disease   . Cataracts, bilateral   . 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 Pulmonary HTN-severe- due to diastolic dysfunction 09/29/2011    Moderate to Severe Pulm HTN.  Marland Kitchen Acute renal failure 09/29/2011  . 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 HYPOTHYROIDISM 06/19/2008    Qualifier: Diagnosis of  By: Excell Seltzer CMA, Lawson Fiscal    . Renal artery stenosis     s/p stents (restenosis)  . Subclavian arterial stenosis   . Peripheral neuropathy   . Obstructive sleep apnea on nocturnal CPAP 06/20/2008    Qualifier: Diagnosis of  By: Vernie Murders    . Unstable angina 10/19/2011    Past Surgical History  Procedure Date  . Cardiac catheterization 01/2011    Minimal luminal irregularties  . Laparoscopic tubal ligation   . Renal artery stent     x 2 (restenosis)  . Colonoscopy 10/12/2011    Procedure: COLONOSCOPY;  Surgeon: Louis Meckel, MD;  Location: [facility] ENDOSCOPY;  Service: Endoscopy;  Laterality: N/A;  . Esophagogastroduodenoscopy 10/12/2011    Procedure: ESOPHAGOGASTRODUODENOSCOPY (EGD);  Surgeon: Louis Meckel, MD;  Location[facility] ENDOSCOPY;  Service: Endoscopy;  Laterality: N/A;    Family History  Problem Relation Age of Onset  . Heart disease Mother   . Heart attack  Mother   . Heart disease Father   . Heart attack Father   . Heart disease Sister   . Heart disease Daughter    Social History:  reports that she quit smoking about 7 years ago. Her smoking use included Cigarettes. She has a 25 pack-year smoking history. She does not have any smokeless tobacco history on file. She reports that she does not drink alcohol or use illicit drugs.  Allergies:  Allergies  Allergen Reactions  . Atenolol     unknown  . Bystolic (Nebivolol Hcl)     Cannot tolerate greater then 5 mg, causes  chest pain  . Ciprofloxacin     unknown  . Codeine     swelling  . Lidocaine     Throat swelling  . Metformin     vomiting  . Metoprolol     unknown  . Penicillins     rash   Outpatient medications:  Xanax 0.5 mg as needed at bedtime Norvasc 10 mg one daily Aspirin 81 mg daily Vitamin D3 2000 units one at bedtime On ceftriaxone 2 g in D5%- 50 cc daily IV Lexapro 20 mg daily Nexium 40 mg as needed  Lasix 40 mg daily Apresoline 50 mg one every 6 hours Lantus 65 units at bedtime Humalog 10-20 units subcutaneous 3 times daily with meals i.e. 10 units with breakfast 10 units at lunch 20 units at dinner Imdur 60 mg daily Synthroid 112 mcg daily Diastolic 2.5 mg daily Levoza 2 g twice a day Potassium 20 mEq daily  Results for orders placed during the hospital encounter of 10/19/11 (from the past 48 hour(s))  CBC     Status: Abnormal   Collection Time   10/19/11  3:06 PM      Component Value Range Comment   WBC 8.7  4.0 - 10.5 (K/uL)    RBC 3.37 (*) 3.87 - 5.11 (MIL/uL)    Hemoglobin 10.1 (*) 12.0 - 15.0 (g/dL)    HCT 36.6 (*) 44.0 - 46.0 (%)    MCV 88.7  78.0 - 100.0 (fL)    MCH 30.0  26.0 - 34.0 (pg)    MCHC 33.8  30.0 - 36.0 (g/dL)    RDW 34.7  42.5 - 95.6 (%)    Platelets 207  150 - 400 (K/uL)   DIFFERENTIAL     Status: Abnormal   Collection Time   10/19/11  3:06 PM      Component Value Range Comment   Neutrophils Relative 86 (*) 43 - 77 (%)    Neutro Abs 7.4  1.7 - 7.7 (K/uL)    Lymphocytes Relative 9 (*) 12 - 46 (%)    Lymphs Abs 0.8  0.7 - 4.0 (K/uL)    Monocytes Relative 4  3 - 12 (%)    Monocytes Absolute 0.3  0.1 - 1.0 (K/uL)    Eosinophils Relative 1  0 - 5 (%)    Eosinophils Absolute 0.1  0.0 - 0.7 (K/uL)    Basophils Relative 0  0 - 1 (%)    Basophils Absolute 0.0  0.0 - 0.1 (K/uL)   BASIC METABOLIC PANEL     Status: Abnormal   Collection Time   10/19/11  3:06 PM      Component Value Range Comment   Sodium 136  135 - 145 (mEq/L)    Potassium 4.5   3.5 - 5.1 (mEq/L)    Chloride 101  96 - 112 (mEq/L)    CO2 20  19 - 32 (mEq/L)    Glucose, Bld 86  70 - 99 (mg/dL)    BUN 49 (*) 6 - 23 (mg/dL)    Creatinine, Ser 1.61 (*) 0.50 - 1.10 (mg/dL)    Calcium 8.9  8.4 - 10.5 (mg/dL)    GFR calc non Af Amer 38 (*) >90 (mL/min)    GFR calc Af Amer 44 (*) >90 (mL/min)   CARDIAC PANEL(CRET KIN+CKTOT+MB+TROPI)     Status: Abnormal   Collection Time   10/19/11  3:06 PM      Component Value Range Comment   Total CK 174  7 - 177 (U/L)    CK, MB 11.3 (*) 0.3 - 4.0 (ng/mL)    Troponin I 0.36 (*) <0.30 (ng/mL)    Relative Index 6.5 (*) 0.0 - 2.5    PROTIME-INR     Status: Normal   Collection Time   10/19/11  3:06 PM      Component Value Range Comment   Prothrombin Time 13.6  11.6 - 15.2 (seconds)    INR 1.02  0.00 - 1.49    POCT I-STAT TROPONIN I     Status: Abnormal   Collection Time   10/19/11  3:50 PM      Component Value Range Comment   Troponin i, poc 0.29 (*) 0.00 - 0.08 (ng/mL)    Comment 3             Dg Chest 2 View  10/19/2011  *RADIOLOGY REPORT*  Clinical Data: Shortness of breath and chest pain  CHEST - 2 VIEW  Comparison: 10/09/2011  Findings: There is a right arm PICC line with tip in the SVC.  The heart size and mediastinal contours are within normal limits.  Both lungs are clear.  The visualized skeletal structures are unremarkable.  IMPRESSION: Negative examination.  Original Report Authenticated By: Rosealee Albee, M.D.    ROS: General:Anxious , No recent colds or fevers positive endocarditis Skin:No rashes or ulcers HEENT:No blurred vision CV:C. History of present illness PUL:See history of present illness GI:Has had loose stools up to 3 a day no melena denies indigestion GU:No hematuria or dysuria WR:UEAVWU joint pain Neuro:Stated she had near syncope today but prior to today no dizziness or lightheadedness Endo:She is diabetic her glucose is been stable as per the patient   Blood pressure 124/40, pulse 60, temperature  98.4 F (36.9 C), temperature source Oral, resp. rate 18, height 5' 2.99" (1.6 m), weight 78.7 kg (173 lb 8 oz), SpO2 100.00%. PE: General:Alert and oriented white female currently in no acute distress she is anxious Skin:Warm and dry brisk capillary refill HEENT:Normocephalic, sclera clear Neck:Supple, soft carotid bruit on the left no JVD Heart:S1-S2 regular rate and rhythm with a soft systolic murmur, No gallop Lungs: Clear without rales rhonchi or wheezes JWJ:XBJYNWGN bowel sounds soft nontender cannot palpate liver spleen or masses Ext:No lower extremity edema 2+ pedal pulses bilaterally 2+ radial pulses bilaterally Neuro:Alert and oriented x3 follows commands moves all extremities    Assessment/Plan Patient Active Problem List  Diagnoses  . HYPOTHYROIDISM  . HYPERLIPIDEMIA  . OTHER SPECIFIED IDIOPATHIC PERIPHERAL NEUROPATHY  . HYPERTENSION, history of statin induced myositis  . HYPERTENSIVE CARDIOVASCULAR DISEASE  . COPD UNSPECIFIED  . VERTIGO  . Obstructive sleep apnea on nocturnal CPAP  . Leukocytosis  . Acute renal failure  . Elevated troponin  . Pulmonary HTN-severe- due to diastolic dysfunction  . 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  . Nausea with vomiting  .  Diarrhea  . Volume depletion, gastrointestinal loss  . Normocytic anemia  . Hyperglycemia  . Streptococcal endocarditis  . Recurrent otitis media  . Chest pain  . Diabetes mellitus  . RAS (renal artery stenosis), s/p renal artery PTA X 2  . Esophageal reflux  . Special screening for malignant neoplasms, colon  . Normal coronary angiogram Sept 2012, Nl LVF  . Benign neoplasm of colon  . Unstable angina   PLAN: Patient now presenting with increasing chest pain or the last 2-3 days associated with shortness of breath nausea and vomiting and diaphoresis. EKG is abnormal on this admission with ischemic changes in her lateral leads.  In 2012 senna cardiac catheterization  revealing no coronary artery disease.  He does have multiple risk factors including diabetes strong family history hypertension and dyslipidemia. She is unable to take statins secondary to myositis.  She is on antibiotics for endocarditis with vegetation on a 2D Echo On aortic mitral and tricuspid valves.  Nuclear stress test done in May was negative for ischemia.  Now she has abnormal troponin level.  Will admit to step down unit IV heparin IV nitroglycerin follow cardiac enzymes and proceed with 2D echo and either TEE tomorrow versus cardiac catheterization depending on the results of followup labs.  Will also get repeat blood cultures.  INGOLD,LAURA R 10/19/2011, 5:27 PM   Patient seen and examined. Agree with assessment and plan. Patient now represents with chest tightness associated with nausea and vomiting yesterday.  She apparently did not have significant obstructive CAD at cath last year.  Recently she was diagnosed with endocarditis from 09/29/11 admission and at that time had positive CPK and Trop with filamentous vegetations noted on 3 valves. She again was readmitted on 5/19 with CP and ECG was normal. She underwent endo and coloscopy during that admission. She re presents now with recurrent chest pain and worsening STT changes laterally and again has mildly positive troponin. Concern for ischemia, ? Embolic from SBE vs progressive obstructive. Pt does have PVD and is s/p stents to renals and subclavian. Pt has been antibiotics for approximately 19 days for streptocococal endocarditis.  Will culture to see if blood cultures are still positive. Will check echo to reassess valves. Will keep NPO for possible cath tomorrow.    Lennette Bihari, MD, [facility] 10/19/2011 5:55 PM

## 2011-10-19 NOTE — ED Notes (Addendum)
Dr.Rancour aware of critical istat troponin of 0.29

## 2011-10-19 NOTE — ED Notes (Signed)
Pt aware ABX will be ordered

## 2011-10-20 ENCOUNTER — Encounter (HOSPITAL_COMMUNITY): Payer: Self-pay | Admitting: *Deleted

## 2011-10-20 ENCOUNTER — Encounter (HOSPITAL_COMMUNITY)
Admission: EM | Disposition: A | Discharge status: Home Health | Payer: Self-pay | Source: Home / Self Care | Attending: Internal Medicine

## 2011-10-20 DIAGNOSIS — K625 Hemorrhage of anus and rectum: Secondary | ICD-10-CM | POA: Diagnosis present

## 2011-10-20 DIAGNOSIS — D126 Benign neoplasm of colon, unspecified: Secondary | ICD-10-CM

## 2011-10-20 DIAGNOSIS — D62 Acute posthemorrhagic anemia: Secondary | ICD-10-CM | POA: Diagnosis present

## 2011-10-20 HISTORY — PX: CORONARY ANGIOGRAM: SHX5466

## 2011-10-20 LAB — CBC
HCT: 29.5 % — ABNORMAL LOW (ref 36.0–46.0)
HCT: 26.1 % — ABNORMAL LOW (ref 36.0–46.0)
Hemoglobin: 10 g/dL — ABNORMAL LOW (ref 12.0–15.0)
Hemoglobin: 9 g/dL — ABNORMAL LOW (ref 12.0–15.0)
Hemoglobin: 9.4 g/dL — ABNORMAL LOW (ref 12.0–15.0)
Hemoglobin: 8.7 g/dL — ABNORMAL LOW (ref 12.0–15.0)
MCH: 29.9 pg (ref 26.0–34.0)
MCH: 30.5 pg (ref 26.0–34.0)
MCH: 28.7 pg (ref 26.0–34.0)
MCHC: 33.9 g/dL (ref 30.0–36.0)
MCHC: 34.4 g/dL (ref 30.0–36.0)
MCV: 85.6 fL (ref 78.0–100.0)
MCV: 85.6 fL (ref 78.0–100.0)
Platelets: 173 10*3/uL (ref 150–400)
Platelets: 165 10*3/uL (ref 150–400)
RBC: 2.95 MIL/uL — ABNORMAL LOW (ref 3.87–5.11)
RBC: 3.27 MIL/uL — ABNORMAL LOW (ref 3.87–5.11)
RDW: 13.9 % (ref 11.5–15.5)
RDW: 16.5 % — ABNORMAL HIGH (ref 11.5–15.5)
WBC: 7.4 10*3/uL (ref 4.0–10.5)
WBC: 7 10*3/uL (ref 4.0–10.5)

## 2011-10-20 LAB — GLUCOSE, CAPILLARY
Glucose-Capillary: 96 mg/dL (ref 70–99)
Glucose-Capillary: 141 mg/dL — ABNORMAL HIGH (ref 70–99)
Glucose-Capillary: 161 mg/dL — ABNORMAL HIGH (ref 70–99)
Glucose-Capillary: 171 mg/dL — ABNORMAL HIGH (ref 70–99)

## 2011-10-20 LAB — CARDIAC PANEL(CRET KIN+CKTOT+MB+TROPI)
CK, MB: 11.9 ng/mL (ref 0.3–4.0)
CK, MB: 10.2 ng/mL (ref 0.3–4.0)
Relative Index: 6.8 — ABNORMAL HIGH (ref 0.0–2.5)
Relative Index: 8.1 — ABNORMAL HIGH (ref 0.0–2.5)
Total CK: 174 U/L (ref 7–177)
Total CK: 151 U/L (ref 7–177)
Troponin I: 0.33 ng/mL (ref <0.30)

## 2011-10-20 LAB — PREPARE RBC (CROSSMATCH)

## 2011-10-20 LAB — BASIC METABOLIC PANEL
Calcium: 8.8 mg/dL (ref 8.4–10.5)
Creatinine, Ser: 1.5 mg/dL — ABNORMAL HIGH (ref 0.50–1.10)
GFR calc non Af Amer: 36 mL/min — ABNORMAL LOW (ref >90)
Sodium: 140 mEq/L (ref 135–145)

## 2011-10-20 LAB — ABO/RH: ABO/RH(D): A POS

## 2011-10-20 LAB — MAGNESIUM: Magnesium: 1.8 mg/dL (ref 1.5–2.5)

## 2011-10-20 LAB — PROTIME-INR: Prothrombin Time: 14.8 seconds (ref 11.6–15.2)

## 2011-10-20 LAB — HEPARIN LEVEL (UNFRACTIONATED): Heparin Unfractionated: 0.67 IU/mL (ref 0.30–0.70)

## 2011-10-20 SURGERY — CORONARY ANGIOGRAM

## 2011-10-20 MED ORDER — ONDANSETRON HCL 4 MG/2ML IJ SOLN
4.0000 mg | Freq: Four times a day (QID) | INTRAMUSCULAR | Status: DC | PRN
  Administered 2011-10-20 – 2011-10-23 (×3): 4 mg via INTRAVENOUS
  Filled 2011-10-20 (×4): qty 2

## 2011-10-20 MED ORDER — HEPARIN (PORCINE) IN NACL 2-0.9 UNIT/ML-% IJ SOLN
INTRAMUSCULAR | Status: AC
  Filled 2011-10-20: qty 2000

## 2011-10-20 MED ORDER — SODIUM CHLORIDE 0.9 % IJ SOLN
3.0000 mL | Freq: Two times a day (BID) | INTRAMUSCULAR | Status: DC
  Administered 2011-10-20: 3 mL via INTRAVENOUS

## 2011-10-20 MED ORDER — MIDAZOLAM HCL 2 MG/2ML IJ SOLN
INTRAMUSCULAR | Status: AC
  Filled 2011-10-20: qty 2

## 2011-10-20 MED ORDER — ONDANSETRON HCL 4 MG/2ML IJ SOLN
INTRAMUSCULAR | Status: AC
  Administered 2011-10-20: 4 mg via INTRAVENOUS
  Filled 2011-10-20: qty 2

## 2011-10-20 MED ORDER — SODIUM CHLORIDE 0.9 % IV BOLUS (SEPSIS)
500.0000 mL | Freq: Once | INTRAVENOUS | Status: AC
  Administered 2011-10-20: 500 mL via INTRAVENOUS

## 2011-10-20 MED ORDER — SODIUM CHLORIDE 0.9 % IJ SOLN: 3.0000 mL | INTRAMUSCULAR | Status: DC | PRN

## 2011-10-20 MED ORDER — NITROGLYCERIN 0.2 MG/ML ON CALL CATH LAB
INTRAVENOUS | Status: AC
  Filled 2011-10-20: qty 1

## 2011-10-20 MED ORDER — DIAZEPAM 5 MG PO TABS
5.0000 mg | ORAL_TABLET | ORAL | Status: AC
  Administered 2011-10-20: 5 mg via ORAL
  Filled 2011-10-20: qty 1

## 2011-10-20 MED ORDER — SODIUM CHLORIDE 0.9 % IV SOLN
1.0000 mL/kg/h | INTRAVENOUS | Status: DC
Start: 2011-10-20 — End: 2011-10-21
  Administered 2011-10-20: 0.99 mL/kg/h via INTRAVENOUS

## 2011-10-20 MED ORDER — ACETAMINOPHEN 325 MG PO TABS
650.0000 mg | ORAL_TABLET | ORAL | Status: DC | PRN
  Administered 2011-10-21 – 2011-10-22 (×2): 650 mg via ORAL
  Filled 2011-10-20 (×2): qty 2

## 2011-10-20 MED ORDER — CHLOROPROCAINE HCL 1 % IJ SOLN
20.0000 mL | INTRAMUSCULAR | Status: AC
  Administered 2011-10-20: 20 mL
  Filled 2011-10-20: qty 30

## 2011-10-20 MED ORDER — SODIUM CHLORIDE 0.9 % IV SOLN: 250.0000 mL | INTRAVENOUS | Status: DC | PRN

## 2011-10-20 MED ORDER — OXYCODONE-ACETAMINOPHEN 5-325 MG PO TABS: 1.0000 | ORAL_TABLET | ORAL | Status: DC | PRN

## 2011-10-20 MED ORDER — FENTANYL CITRATE 0.05 MG/ML IJ SOLN
INTRAMUSCULAR | Status: AC
  Filled 2011-10-20: qty 2

## 2011-10-20 NOTE — Progress Notes (Signed)
ANTICOAGULATION CONSULT NOTE - Follow Up Consult  Pharmacy Consult for heparin Indication: NSTEMI  Labs:  Basename 10/20/11 0420 10/20/11 0414 10/20/11 0020 10/19/11 2157 10/19/11 1506  HGB 10.0* -- -- -- 10.1*  HCT 29.5* -- -- -- 29.9*  PLT 187 -- -- -- 207  APTT -- -- -- -- --  LABPROT 14.8 -- -- -- 13.6  INR 1.14 -- -- -- 1.02  HEPARINUNFRC -- 0.67 -- 0.87* --  CREATININE 1.50* -- -- -- 1.43*  CKTOTAL -- -- 164 -- 174  CKMB -- -- 13.3* -- 11.3*  TROPONINI -- -- 0.33* -- 0.36*    Assessment/Plan: 66yo female on heparin for NSTEMI, initial level supratherapeutic but thought to be due to bolus effect and rate maintained.  RN called pharmacy to notify of pt having multiple consecutive stools consisting of frank red blood; instructed RN to drawn heparin level stat and turn off heparin gtt.  Level reported back at goal, though pt continues to have BRBPR.  Pt notified RN that she recently had a colonoscopy.  RN spoke with Nada Boozer who agreed to leave heparin gtt off for now.  Will continue to monitor and f/u plans.  Colleen Can PharmD BCPS 10/20/2011,5:18 AM

## 2011-10-20 NOTE — Progress Notes (Signed)
Pt having multiple consecutive stools that are frank red blood. VSS. Pharmacy notified and heparin held. AM labs drawn, and MD notified. New orders received and followed. Will continue to monitor closely.

## 2011-10-20 NOTE — Progress Notes (Signed)
Subjective: No further chest pain and rectal bleeding has slowed.    Objective: Vital signs in last 24 hours: Temp:  [97.7 F (36.5 C)-99.7 F (37.6 C)] 99.7 F (37.6 C) (05/30 0400) Pulse Rate:  [50-87] 77  (05/30 0520) Resp:  [13-27] 24  (05/30 0520) BP: (114-144)/(34-48) 138/37 mmHg (05/30 0520) SpO2:  [90 %-100 %] 99 % (05/30 0520) Weight:  [78.7 kg (173 lb 8 oz)-80.8 kg (178 lb 2.1 oz)] 80.8 kg (178 lb 2.1 oz) (05/29 1945) Weight change:  Last BM Date: 10/19/11 Intake/Output from previous day: +507 05/29 0701 - 05/30 0700 In: 1297.9 [P.O.:350; I.V.:445.9; IV Piggyback:502] Out: 790 [Urine:300; Stool:490] Intake/Output this shift:    PE: General:alert and oriented, complains of being tired, on commode most of night Skin:warm and dry, brisk capillary refill Heart:S1S2 RRR Lungs:clear without rales, rhonchi and wheezes Abd:+ BS soft, non tender Ext:no edema Neuro:alert and oriented X 3, MAE, follows commands   Lab Results:  Basename 10/20/11 0420 10/19/11 1506  WBC 11.6* 8.7  HGB 10.0* 10.1*  HCT 29.5* 29.9*  PLT 187 207   BMET  Basename 10/20/11 0420 10/19/11 1506  NA 140 136  K 3.9 4.5  CL 104 101  CO2 21 20  GLUCOSE 75 86  BUN 46* 49*  CREATININE 1.50* 1.43*  CALCIUM 8.8 8.9    Basename 10/20/11 0020 10/19/11 1506  TROPONINI 0.33* 0.36*    Lab Results  Component Value Date   CHOL 172 10/20/2011   HDL 41 10/20/2011   LDLCALC 97 10/20/2011   TRIG 170* 10/20/2011   CHOLHDL 4.2 10/20/2011   Lab Results  Component Value Date   HGBA1C 8.2* 10/10/2011     Lab Results  Component Value Date   TSH 1.077 09/29/2011    Hepatic Function Panel No results found for this basename: PROT,ALBUMIN,AST,ALT,ALKPHOS,BILITOT,BILIDIR,IBILI in the last 72 hours  Basename 10/20/11 0420  CHOL 172   No results found for this basename: PROTIME in the last 72 hours    EKG: Orders placed during the hospital encounter of 10/19/11  . EKG 12-LEAD  . EKG 12-LEAD  . EKG  12-LEAD  . EKG 12-LEAD  . EKG 12-LEAD    Studies/Results: Dg Chest 2 View  10/19/2011  *RADIOLOGY REPORT*  Clinical Data: Shortness of breath and chest pain  CHEST - 2 VIEW  Comparison: 10/09/2011  Findings: There is a right arm PICC line with tip in the SVC.  The heart size and mediastinal contours are within normal limits.  Both lungs are clear.  The visualized skeletal structures are unremarkable.  IMPRESSION: Negative examination.  Original Report Authenticated By: Rosealee Albee, M.D.    Medications: I have reviewed the patient's current medications.    Marland Kitchen amLODipine  10 mg Oral Daily  . aspirin  324 mg Oral NOW   Or  . aspirin  300 mg Rectal NOW  . aspirin EC  81 mg Oral Daily  . cefTRIAXone (ROCEPHIN) IVPB 2 gram/50 mL D5W (Pyxis)  2 g Intravenous Q24H  . cefTRIAXone (ROCEPHIN)  IV  2 g Intravenous To Major  . escitalopram  20 mg Oral Daily  . furosemide  40 mg Oral Daily  . heparin  4,000 Units Intravenous Once  . insulin aspart  0-9 Units Subcutaneous TID WC  . insulin aspart  5 Units Subcutaneous TID WC  . levothyroxine  112 mcg Oral Q breakfast  . nebivolol  2.5 mg Oral Daily  . nitroGLYCERIN  2-200 mcg/min Intravenous Once  .  omega-3 acid ethyl esters  2 g Oral BID  . ondansetron (ZOFRAN) IV  4 mg Intravenous Once  . pantoprazole  40 mg Oral Q1200  . potassium chloride  20 mEq Oral Daily  . predniSONE  20 mg Oral Q breakfast  . sodium chloride  500 mL Intravenous Once  . DISCONTD: aspirin EC  81 mg Oral Daily  . DISCONTD: furosemide  40 mg Oral Daily  . DISCONTD: gi cocktail  30 mL Oral Once  . DISCONTD: hydrALAZINE  50 mg Oral Q6H  . DISCONTD: insulin glargine  65 Units Subcutaneous QHS  . DISCONTD: nitroGLYCERIN  1 inch Topical Once  . DISCONTD: pantoprazole  40 mg Oral Q1200  . DISCONTD: potassium chloride SA  20 mEq Oral Daily   Assessment/Plan: Patient Active Problem List  Diagnoses  . HYPOTHYROIDISM  . HYPERLIPIDEMIA  . OTHER SPECIFIED IDIOPATHIC  PERIPHERAL NEUROPATHY  . HYPERTENSION, history of statin induced myositis  . HYPERTENSIVE CARDIOVASCULAR DISEASE  . COPD UNSPECIFIED  . VERTIGO  . Obstructive sleep apnea on nocturnal CPAP  . Leukocytosis  . Acute renal failure  . Elevated troponin  . Pulmonary HTN-severe- due to diastolic dysfunction  . 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  . Nausea with vomiting  . Diarrhea  . Volume depletion, gastrointestinal loss  . Normocytic anemia  . Hyperglycemia  . Streptococcal endocarditis  . Recurrent otitis media  . Chest pain  . Diabetes mellitus  . RAS (renal artery stenosis), s/p renal artery PTA X 2  . Esophageal reflux  . Special screening for malignant neoplasms, colon  . Normal coronary angiogram Sept 2012, Nl LVF  . Benign neoplasm of colon  . Unstable angina  Acute Rectal Bleeding.  PLAN:Have call in to GI.  They will see ASAP -Began bleeding early this AM, bright blood, rectally with history of polypectomy on 10/12/11.  Chest pain has subsided with pk troponin this admit 0.36,  And elevated MB.    Has a new murmur, also. 2D Echo  Has been ordered stat. Complex issues.  DR. Allyson Sabal here.   LOS: 1 day   INGOLD,LAURA R 10/20/2011, 7:39 AM  Agree with note written by Nada Boozer RNP  Pt well known to me. Readmitted with chest pain and SOB which was nitrate responsive. She has more impressive lateral T wave inversion and CP which improved with IV NTG. She was also placed on IV heparin and developed acute Lower GIB with BRBPR and decrease in hgb. Her troponin was mildly elevated as was her d-dimer. Heparin has subsequently been D/Cd. She was cathed last year and had clean coronaries. On exam she has a to and fro aortic out flow murmur and clear lungs. Plan is for TTE this AM to r/o new structural pathology secondary to SBE, cath radially this afternoon. GI has been reconsulted. She will probably require re colonoscopy to define bleeding  source. She will need a V/Q scan as well.   Runell Gess 10/20/2011 8:22 AM

## 2011-10-20 NOTE — Progress Notes (Signed)
ANTICOAGULATION CONSULT NOTE - Follow Up Consult  Pharmacy Consult for heparin Indication: NSTEMI  Labs:  [facility] 10/19/11 2157 10/19/11 1506  HGB -- 10.1*  HCT -- 29.9*  PLT -- 207  APTT -- --  LABPROT -- 13.6  INR -- 1.02  HEPARINUNFRC 0.87* --  CREATININE -- 1.43*  CKTOTAL -- 174  CKMB -- 11.3*  TROPONINI -- 0.36*    Assessment/Plan: 66yo female with supratherapeutic heparin level with initial dosing for NSTEMI; however, level was drawn just five hours after bolus given and renal function is borderline so likely due to bolus effect.  Will continue gtt at current rate and confirm with am labs.  Colleen Can PharmD BCPS 10/20/2011,12:33 AM

## 2011-10-20 NOTE — Progress Notes (Signed)
Pt with 4 frank bloody stools since 1900 tonight. Hgb recheck at 2100 revealed a HGB of 8.7. [facility] notified of the situation and order received to keep pt NPO and recheck Hgb with AM labs. VSS. Will cont to monitor.

## 2011-10-20 NOTE — Care Management Note (Signed)
    Page 1 of 1   10/20/2011     8:38:21 AM   CARE MANAGEMENT NOTE 10/20/2011  Patient:  [name], [name]   Account Number:  [id number]  Date Initiated:  10/20/2011  Documentation initiated by:  Junius Creamer  Subjective/Objective Assessment:   adm w pos trop, ch pain     Action/Plan:   lives w husband,pcp dr Thalia Party [place], act w [facility]   Anticipated DC Date:  10/22/2011   Anticipated DC Plan:  HOME W HOME HEALTH SERVICES      DC Planning Services  CM consult      [facility] Choice  Resumption Of Svcs/PTA Provider   Choice offered to / List presented to:          [facility] arranged  HH-1 RN      [facility] agency  [facility]   Status of service:   Medicare Important Message given?   (If response is "NO", the following Medicare IM given date fields will be blank) Date Medicare IM given:   Date Additional Medicare IM given:    Discharge Disposition:    Per UR Regulation:  Reviewed for med. necessity/level of care/duration of stay  If discussed at Long Length of Stay Meetings, dates discussed:    Comments:  5/30 8:37a debbie Frutoso [place] rn,bsn [phone]

## 2011-10-20 NOTE — Progress Notes (Signed)
  Echocardiogram 2D Echocardiogram has been performed.  Jorje Guild [facility] 10/20/2011, 9:06 AM

## 2011-10-20 NOTE — Consult Note (Signed)
[facility] Consult: 9:59 AM 10/20/2011   Referring Provider: Dr Allyson Sabal Primary Care Physician:  Alice Reichert, MD, MD Primary Gastroenterologist:  Dr. Leone Payor   Reason for Consultation:  Hematochezia after heparin initiated.   HPI: [name] is a 66 y.o. female with IDDM and multiple problems.  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  On schedule for 5/28 out pt screening colonoscopy, off Plavix.  However, she was readmitted 5/19 with non-cardiac chest/epigastric pain and had Colon/EGD on 5/22.  Several sessile colon polyps (adenomas) removed, polypectomy sites fulgurated, area of polyposis tattood for future reference.   EGD was normal.  Discharged home 5/23 on 81 mg ASA but not Plavix.   Readmitted 5/29.  Has had near constant chest pain since discharge, but developed acute SOB, n/v, sweats yesterday.  New lateral ischemia on EKG, Troponin and MB elevated:  Non-STEMI.  Started on Heparin drip starting with bolus dose. . She began passing painless hematochezia about 4 AM today.  Heparin level was supratherapeutic. Heparin was d/c'd within 15 minutes. Rectal bleeding has slowed, smaller volume bleeding last at 0930 this AM.  BP soft with SBP 113 ot 144.  Pulses 50 t0 80s.   Had 2 days of n/v and SOB.  Emesis was of part digested food, no blood.  Exertional chest pressure was a daily occurrence, similar to sxs leading to the second of her recent admits.  Generally has poor appetite. Stools soft to loose, about 3 per day but non-melenic and no blood until this AM.     Past Medical History  Diagnosis Date  . HTN (hypertension)   . Elevated troponin level 09/28/11    Type II MI - not ACS related  . Diastolic dysfunction, left ventricle     with previous Diastolic HF  . Diabetes mellitus     . High cholesterol   . Kidney disease   . Cataracts, bilateral   . 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 Pulmonary HTN-severe- due to diastolic dysfunction 09/29/2011    Moderate to Severe Pulm HTN.  Marland Kitchen Acute renal failure 09/29/2011  . 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 HYPOTHYROIDISM 06/19/2008    Qualifier: Diagnosis of  By: Excell Seltzer CMA, Lawson Fiscal    . Renal artery stenosis     s/p stents (restenosis)  . Subclavian arterial stenosis   . Peripheral neuropathy   . Obstructive sleep apnea on nocturnal CPAP 06/20/2008    Qualifier: Diagnosis of  By: Vernie Murders    . Unstable angina 10/19/2011  . Coronary artery disease   . Myocardial infarction   . Pericarditis     Past Surgical History  Procedure Date  . Cardiac catheterization 01/2011    Minimal luminal irregularties  . Laparoscopic tubal ligation   . Renal artery stent     x 2 (restenosis)  . Colonoscopy 10/12/2011    Procedure: COLONOSCOPY;  Surgeon: Louis Meckel, MD;  [facility] END[facility]COPY;  Service: Endoscopy;  Laterality: N/A;  . Esophagogastroduodenoscopy 10/12/2011    Procedure: ESOPHAGOGASTRODUODENOSCOPY (EGD);  Surgeon: Louis Meckel, MD;  Location: [facility] ENDOSCOPY;  Service: Endoscopy;  Laterality: N/A;  . Tubal ligation     Prior to Admission medications   Medication Sig Start Date End Date Taking? Authorizing Provider  ALPRAZolam Prudy Feeler) 0.5 MG tablet Take 0.5 mg by mouth at bedtime  as needed. For anxiety   Yes Historical Provider, MD  amLODipine (NORVASC) 10 MG tablet Take 1 tablet (10 mg total) by mouth daily. 10/06/11 10/05/12 Yes Adeline Joselyn Glassman, MD  aspirin EC 81 MG tablet Take 81 mg by mouth daily.   Yes Historical Provider, MD  Cholecalciferol (VITAMIN D3) 2000 UNITS TABS Take 1 tablet by mouth at bedtime.     Yes Historical Provider, MD  dextrose 5 % SOLN 50 mL with cefTRIAXone 2 G SOLR 2 g Inject 2 g into the vein daily. 10/13/11  11/03/11 Yes Laveda Norman, MD  escitalopram (LEXAPRO) 20 MG tablet Take 20 mg by mouth daily.     Yes Historical Provider, MD  esomeprazole (NEXIUM) 40 MG capsule Take 40 mg by mouth daily as needed. For reflux   Yes Historical Provider, MD  furosemide (LASIX) 40 MG tablet Take 1 tablet (40 mg total) by mouth daily. 10/13/11 10/12/12 Yes Laveda Norman, MD  hydrALAZINE (APRESOLINE) 50 MG tablet Take 1 tablet (50 mg total) by mouth every 6 (six) hours. 10/06/11 10/05/12 Yes Adeline C Viyuoh, MD  insulin glargine (LANTUS) 100 UNIT/ML injection Inject 65 Units into the skin at bedtime.   Yes Historical Provider, MD  insulin lispro (HUMALOG) 100 UNIT/ML injection Inject 10-20 Units into the skin 3 (three) times daily with meals. 10 units with breakfast, 10 units with lunch, 20 units with dinner   Yes Historical Provider, MD  isosorbide mononitrate (IMDUR) 60 MG 24 hr tablet Take 60 mg by mouth daily.     Yes Historical Provider, MD  levothyroxine (SYNTHROID, LEVOTHROID) 112 MCG tablet Take 112 mcg by mouth daily.     Yes Historical Provider, MD  nebivolol (BYSTOLIC) 2.5 MG tablet Take 2.5 mg by mouth daily.     Yes Historical Provider, MD  omega-3 acid ethyl esters (LOVAZA) 1 G capsule Take 2 g by mouth 2 (two) times daily.   Yes Historical Provider, MD  potassium chloride SA (K-DUR,KLOR-CON) 20 MEQ tablet Take 1 tablet (20 mEq total) by mouth daily. 10/13/11  Yes Laveda Norman, MD    Scheduled Meds:    . amLODipine  10 mg Oral Daily  . aspirin  324 mg Oral NOW   Or  . aspirin  300 mg Rectal NOW  . aspirin EC  81 mg Oral Daily  . cefTRIAXone (ROCEPHIN) IVPB 2 gram/50 mL D5W (Pyxis)  2 g Intravenous Q24H  . cefTRIAXone (ROCEPHIN)  IV  2 g Intravenous To Major  . escitalopram  20 mg Oral Daily  . furosemide  40 mg Oral Daily  . heparin  4,000 Units Intravenous Once  . insulin aspart  0-9 Units Subcutaneous TID WC  . insulin aspart  5 Units Subcutaneous TID WC  . levothyroxine  112 mcg Oral Q breakfast  .  nebivolol  2.5 mg Oral Daily  . nitroGLYCERIN  2-200 mcg/min Intravenous Once  . omega-3 acid ethyl esters  2 g Oral BID  . ondansetron (ZOFRAN) IV  4 mg Intravenous Once  . pantoprazole  40 mg Oral Q1200  . potassium chloride  20 mEq Oral Daily  . predniSONE  20 mg Oral Q breakfast  . sodium chloride  500 mL Intravenous Once   Infusions:    . sodium chloride 20 mL/hr at 10/20/11 0600  . nitroGLYCERIN 2 mcg/min (10/19/11 1951)  . DISCONTD: heparin Stopped (10/20/11 0410)   PRN Meds: acetaminophen, ALPRAZolam, ondansetron (ZOFRAN) IV   Allergies as of 10/19/2011 -  Review Complete 10/19/2011  Allergen Reaction Noted  . Atenolol    . Bystolic (nebivolol hcl)  02/03/2011  . Ciprofloxacin    . Codeine    . Lidocaine    . Metformin    . Metoprolol    . Penicillins      Family History  Problem Relation Age of Onset  . Heart disease Mother   . Heart attack Mother   . Heart disease Father   . Heart attack Father   . Heart disease Sister   . Heart disease Daughter     History   Social History  . Marital Status: Married    Spouse Name: N/A    Number of Children: N/A  . Years of Education: N/A   Occupational History  . retired    Social History Main Topics  . Smoking status: Former Smoker -- 1.0 packs/day for 25 years    Types: Cigarettes    Quit date: 05/23/2004  . Smokeless tobacco: Not on file  . Alcohol Use: No  . Drug Use: No  . Sexually Active: Not Currently   Other Topics Concern  . Not on file   Social History Narrative  . No narrative on file    REVIEW OF SYSTEMS: See HPI. 14 system review completed. 40# wt gain over several months.  No rash or skin sore. No pedal edema.   No cough.  No hematuria or dysuria.    No recent transfusions.  No epistaxis.  Vision chronically blurry due to cataracts.    PHYSICAL EXAM: Vital signs in last 24 hours: Temp:  [97.7 F (36.5 C)-100 F (37.8 C)] 100 F (37.8 C) (05/30 0800) Pulse Rate:  [50-87] 77   (05/30 0520) Resp:  [13-27] 24  (05/30 0520) BP: (113-144)/(34-48) 113/43 mmHg (05/30 0800) SpO2:  [90 %-100 %] 100 % (05/30 0800) Weight:  [173 lb 8 oz (78.7 kg)-178 lb 2.1 oz (80.8 kg)] 178 lb 2.1 oz (80.8 kg) (05/29 1945)  General:      Cushingoid. Head:  Moon faced.  Eyes:  No icterus or conj pallor.  EOMI Ears:  Not HOH  Nose:  No discharge or congestion Mouth:  Clear, pink oral MM Neck:  No mass or JVD Lungs:  clear Heart: RRR. 1 to2/6 soft syst murmer.   Abdomen:  Soft, obese, active BS.  NT, ND.  No bruits, mass or HSM.   Rectal: deferred.    Musc/Skeltl: no joint deformity or erythema Extremities:  No pedal edema.  Feet warm.  Neurologic:  Some fine UE tremor.  Moves all 4 limbs.  Oriented x 3.   Skin:  No rash, large hematoma or sores. Tattoos:  None  Nodes:  None at neck   Psych:  Flat affect, relaxed  Intake/Output from previous day: 05/29 0701 - 05/30 0700 In: 1297.9 [P.O.:350; I.V.:445.9; IV Piggyback:502] Out: 790 [Urine:300; Stool:490] Intake/Output this shift: Total I/O In: -  Out: 150 [Stool:150]  LAB RESULTS:    Ref. Range 10/12/2011 05:00 10/13/2011 05:46 10/19/2011 15:06 10/20/2011 04:20 10/20/2011 07:00  WBC Latest Range: 4.0-10.5 K/uL 11.8 (H) 14.2 (H) 8.7 11.6 (H) 9.6  RBC Latest Range: 3.87-5.11 MIL/uL 3.77 (L) 3.93 3.37 (L) 3.34 (L) 2.95 (L)  Hemoglobin Latest Range: 12.0-15.0 g/dL 16.1 (L) 09.6 (L) 04.5 (L) 10.0 (L) 9.0 (L)  HCT Latest Range: 36.0-46.0 % 33.8 (L) 35.4 (L) 29.9 (L) 29.5 (L) 26.2 (L)  MCV Latest Range: 78.0-100.0 fL 89.7 90.1 88.7 88.3 88.8  MCH Latest Range: 26.0-34.0  pg 30.2 29.8 30.0 29.9 30.5  MCHC Latest Range: 30.0-36.0 g/dL 16.1 09.6 04.5 40.9 81.1  RDW Latest Range: 11.5-15.5 % 13.4 13.4 13.7 13.9 14.0  Platelets Latest Range: 150-400 K/uL 360 375 207 187 173    BMET Lab Results  Component Value Date   NA 140 10/20/2011   NA 136 10/19/2011   NA 133* 10/13/2011   K 3.9 10/20/2011   K 4.5 10/19/2011   K 4.9 10/13/2011   CL  104 10/20/2011   CL 101 10/19/2011   CL 98 10/13/2011   CO2 21 10/20/2011   CO2 20 10/19/2011   CO2 21 10/13/2011   GLUCOSE 75 10/20/2011   GLUCOSE 86 10/19/2011   GLUCOSE 156* 10/13/2011   BUN 46* 10/20/2011   BUN 49* 10/19/2011   BUN 67* 10/13/2011   CREATININE 1.50* 10/20/2011   CREATININE 1.43* 10/19/2011   CREATININE 1.89* 10/13/2011   CALCIUM 8.8 10/20/2011   CALCIUM 8.9 10/19/2011   CALCIUM 10.1 10/13/2011   PT/INR Lab Results  Component Value Date   INR 1.14 10/20/2011   INR 1.02 10/19/2011   INR 1.2 05/30/2008   RADIOLOGY STUDIES: Dg Chest 2 View 10/19/2011  *RADIOLOGY REPORT*  Clinical Data: Shortness of breath and chest pain  CHEST - 2 VIEW  Comparison: 10/09/2011  Findings: There is a right arm PICC line with tip in the SVC.  The heart size and mediastinal contours are within normal limits.  Both lungs are clear.  The visualized skeletal structures are unremarkable.  IMPRESSION: Negative examination.  Original Report Authenticated By: Rosealee Albee, M.D.    ENDOSCOPIC STUDIES: Colonoscopy   10/12/11   Robt Kaplan Screening study of pt with Strep Gatolyticus (S. Bovis) 1) Polyps, multiple:  4 sessile polyps in the descending colon/hepatic  flexure/proximal transverse colon measuring 1-2 cm. Each polyp was  removed following submucosal injection of normal saline, 3-4 cc  around and under each polyp.   Polyp remnants in the descending colon were fulgurated utilizing the  argon plasma coagulator   The area of polyposis was injected with "spot" submucosally to  identify the area of polyposis.  A diminutive polyp was found in  the ascending colon. It was 1 mm in size. The polyp was removed  using cold biopsy forceps. 2) Otherwise normal examination  RECOMMENDATIONS:  1) Await biopsy results - f/u 6-12 months pending path   Pathology:   Tubular adenomas without high grade dysplasia.  Some fragments read as benign colon mucosa.   EGD   10/12/11  For eval of chest/epigastric  pain. Normal study  IMPRESSION: *  Painless hematochezia.  Propable post-polypectomy bleed in setting of Heparin use *  Non STEMI.  Chronic chest pain, new dyspnea, sweats, n/v. Cath 01/2011 with minor luminal irregularities.  *  Strep gallolyticus endocarditis.  Home Rocephin via PICC.  ID has been involved in treatment plans.  *  IDDM *  Chronic prednisone for Statin induced myositis.  MD at [facility] is overseeing this.   PLAN: *  Dr Allyson Sabal plans cardiac cath this afternoon. *  Leave off Heparin for now, obviously if necessary to restart to protect myocardium, will need to be restarted and pt transfused as needed.    LOS: 1 day   [name]  10/20/2011, 9:59 AM Pager: [phone]   GI ATTENDING  PATIENT SEEN AND EXAMINED. LABS,XRAYS, RECENT COLONOSCOPY, ENDOSCOPY, AND PATHOLOGY REPORTS REVIEWED. TODAY'S CATH RESULTS REVIEWED. AGREE WITH ABOVE H&P. ACUTE LOWER GI BLEEDING ALMOST CERTAINLY A POST-POLYPECTOMY  BLEED (MULTIPLE LARGE PRECANCEROUS POLYPS REMOVED). THE PROBLEM MAGNIFIED BY HEPARIN. HEMODYNAMICALLY STABLE W/O BLEEDING SINCE THIS AM (HEPARIN OFF). NO PLAVIX FOR WEEKS NOW. MOST POST POLYPECTOMY BLEEDING WITH RESOLVE SPONTANEOUSLY. IF NOT, THERAPEUTIC COLONOSCOPY CAN BE PURSUED. WILL FOLLOW CLOSELY WITH YOU. THANKS.  Wilhemina Bonito. Eda Keys., M.D. [facility] Division of Gastroenterology

## 2011-10-20 NOTE — Progress Notes (Addendum)
Called by RN to report that pt is having bright red blood per rectum.  Heparin level was 0.87 last night at 2167 which may refect some bolus effect after 4000 unit bolus and 900 units/hr. Plan: will get am heparin level now/stat and adjust as needed. Herby Abraham, Pharm.D. [phone] 10/20/2011 4:02 AM   Update: Per RN, pt continues to bleed from rectum; RN will turn heparin off NOW and draw labs STAT; will resume at lower rate when heparin level reports. Vernard Gambles, PharmD, BCPS 10/20/2011 4:15 AM

## 2011-10-20 NOTE — CV Procedure (Addendum)
[name], [name], 66 y.o., 1945/10/26  Location: [facility]CATH LAB Bed: NONE  MRN: [id number]  CSN: [id number]    CARDIAC CATHETERIZATION REPORT   Procedures performed:  1. Selective coronary angiography   Reason for procedure:  Unstable angina pectoris  Procedure performed by: Thurmon Fair, MD, [facility]  Complications: none   Estimated blood loss: less than 5 mL   History:  66 year old woman with numerous medical problems including severe pulmonary hypertension, polymyositis, obstructive sleep apnea, congestive heart failure due to severe left ventricular diastolic dysfunction and recent endocarditis with vegetations on both the mitral and aortic valves do to Streptococcus gallolyticus. She presents chest pain electrocardiographic changes and minimal increase in cardiac enzymes. When intravenous heparin was initiated she developed severe lower GI bleeding requiring transfusion.  Consent: The risks, benefits, and details of the procedure were explained to the patient. Risks including death, MI, stroke, bleeding, limb ischemia, renal failure and allergy were described and accepted by the patient. Informed written consent was obtained prior to proceeding.  Technique: The patient was brought to the cardiac catheterization laboratory in the fasting state. He was prepped and draped in the usual sterile fashion. Local anesthesia with chlorprocaine was administered to the right groin area. Using the modified Seldinger technique a 5 French right common femoral artery sheath was introduced without difficulty. Under fluoroscopic guidance, using 5 [place] JL4 and JR , selective cannulation of the left coronary artery and right coronary artery were respectively performed. Care was taken to avoid cannulation of the left ventricle to avoid potential dislodgment of vegetations. For the same reason you're at about was not crossed with angiogram was not performed. Several coronary angiograms in a variety of  projections were recorded. No immediate complications occurred. At the end of the procedure, all catheters were removed. After the procedure, hemostasis will be achieved with manual pressure.  Contrast used: 60 mL Omnipaque  Angiographic Findings:  1. The left main coronary artery is free of significant atherosclerosis and bifurcates in the usual fashion into the left anterior descending artery and left circumflex coronary artery.  2. The left anterior descending artery is a large vessel that reaches the apex and generates 2 major diagonal branches. There is evidence of mild luminal irregularities and minimal calcification. A 40% stenosis is seen in the mid vessel at the level of the second diagonal artery takeoff. No other hemodynamically meaningful stenoses are seen. 3. The left circumflex coronary artery is a large-size vessel but non- dominant vessel that generates 2 major oblique marginal arteries. The cranial oblique marginal artery is much larger and bifurcates.  There is evidence of mild luminal irregularities and minimal calcification. No hemodynamically meaningful stenoses are seen. 4. The right coronary artery is a large-size dominant vessel that generates a extensive posterior lateral ventricular system as well as the PDA. There is evidence of mild luminal irregularities and minimal calcification. no hemodynamically meaningful stenoses are seen.    IMPRESSIONS:  No evidence of significant coronary stenoses that might explain an acute coronary syndrome.   RECOMMENDATION:  Discontinue heparin anticoagulation in the setting of active lower GI bleeding    Thurmon Fair, MD, [facility] [phone] office [phone] pager 10/20/2011 3:08 PM

## 2011-10-21 ENCOUNTER — Encounter (HOSPITAL_COMMUNITY)
Admission: EM | Disposition: A | Discharge status: Home Health | Payer: Self-pay | Source: Home / Self Care | Attending: Internal Medicine

## 2011-10-21 ENCOUNTER — Encounter (HOSPITAL_COMMUNITY): Payer: Self-pay | Admitting: Internal Medicine

## 2011-10-21 ENCOUNTER — Inpatient Hospital Stay (HOSPITAL_COMMUNITY): Payer: BC Managed Care – PPO

## 2011-10-21 DIAGNOSIS — N183 Chronic kidney disease, stage 3 unspecified: Secondary | ICD-10-CM | POA: Diagnosis present

## 2011-10-21 DIAGNOSIS — R079 Chest pain, unspecified: Secondary | ICD-10-CM | POA: Diagnosis present

## 2011-10-21 DIAGNOSIS — E869 Volume depletion, unspecified: Secondary | ICD-10-CM

## 2011-10-21 DIAGNOSIS — R55 Syncope and collapse: Secondary | ICD-10-CM | POA: Diagnosis present

## 2011-10-21 DIAGNOSIS — K633 Ulcer of intestine: Secondary | ICD-10-CM

## 2011-10-21 HISTORY — PX: COLONOSCOPY: SHX5424

## 2011-10-21 LAB — CBC
HCT: 26.9 % — ABNORMAL LOW (ref 36.0–46.0)
HCT: 24.3 % — ABNORMAL LOW (ref 36.0–46.0)
Hemoglobin: 9.5 g/dL — ABNORMAL LOW (ref 12.0–15.0)
Hemoglobin: 8.6 g/dL — ABNORMAL LOW (ref 12.0–15.0)
MCHC: 35.4 g/dL (ref 30.0–36.0)
MCV: 85 fL (ref 78.0–100.0)
Platelets: 133 10*3/uL — ABNORMAL LOW (ref 150–400)
RBC: 2.79 MIL/uL — ABNORMAL LOW (ref 3.87–5.11)
RBC: 3.18 MIL/uL — ABNORMAL LOW (ref 3.87–5.11)
RDW: 15.2 % (ref 11.5–15.5)
WBC: 10.4 10*3/uL (ref 4.0–10.5)

## 2011-10-21 LAB — GLUCOSE, CAPILLARY
Glucose-Capillary: 229 mg/dL — ABNORMAL HIGH (ref 70–99)
Glucose-Capillary: 216 mg/dL — ABNORMAL HIGH (ref 70–99)

## 2011-10-21 LAB — HEMOGLOBIN AND HEMATOCRIT, BLOOD
HCT: 19.2 % — ABNORMAL LOW (ref 36.0–46.0)
Hemoglobin: 6.7 g/dL — CL (ref 12.0–15.0)

## 2011-10-21 LAB — BASIC METABOLIC PANEL
CO2: 18 mEq/L — ABNORMAL LOW (ref 19–32)
Chloride: 102 mEq/L (ref 96–112)
Potassium: 4.9 mEq/L (ref 3.5–5.1)
Sodium: 132 mEq/L — ABNORMAL LOW (ref 135–145)

## 2011-10-21 LAB — PREPARE RBC (CROSSMATCH)

## 2011-10-21 SURGERY — COLONOSCOPY
Anesthesia: Moderate Sedation

## 2011-10-21 MED ORDER — FENTANYL NICU IV SYRINGE 50 MCG/ML
INJECTION | INTRAMUSCULAR | Status: DC | PRN
  Administered 2011-10-21 (×2): 25 ug via INTRAVENOUS

## 2011-10-21 MED ORDER — MIDAZOLAM HCL 10 MG/2ML IJ SOLN
INTRAMUSCULAR | Status: DC | PRN
  Administered 2011-10-21: 2.5 mg via INTRAVENOUS
  Administered 2011-10-21 (×2): 2 mg via INTRAVENOUS

## 2011-10-21 MED ORDER — PEG 3350-KCL-NA BICARB-NACL 420 G PO SOLR
4000.0000 mL | Freq: Once | ORAL | Status: AC
  Administered 2011-10-21: 4000 mL via ORAL
  Filled 2011-10-21: qty 4000

## 2011-10-21 MED ORDER — SODIUM CHLORIDE 0.9 % IJ SOLN
INTRAMUSCULAR | Status: DC | PRN
  Administered 2011-10-21: 14:00:00

## 2011-10-21 MED ORDER — MIDAZOLAM HCL 10 MG/2ML IJ SOLN
INTRAMUSCULAR | Status: AC
  Filled 2011-10-21: qty 2

## 2011-10-21 MED ORDER — ONDANSETRON HCL 4 MG/2ML IJ SOLN
4.0000 mg | Freq: Once | INTRAMUSCULAR | Status: AC
  Administered 2011-10-21: 4 mg via INTRAVENOUS

## 2011-10-21 MED ORDER — FENTANYL CITRATE 0.05 MG/ML IJ SOLN
INTRAMUSCULAR | Status: AC
  Filled 2011-10-21: qty 2

## 2011-10-21 MED ORDER — EPINEPHRINE HCL 0.1 MG/ML IJ SOLN
INTRAMUSCULAR | Status: AC
  Filled 2011-10-21: qty 10

## 2011-10-21 MED ORDER — PROMETHAZINE HCL 25 MG/ML IJ SOLN
12.5000 mg | Freq: Four times a day (QID) | INTRAMUSCULAR | Status: DC | PRN
  Administered 2011-10-21 – 2011-10-22 (×2): 12.5 mg via INTRAVENOUS
  Filled 2011-10-21 (×2): qty 1

## 2011-10-21 NOTE — Op Note (Signed)
[facility] [address] [place], [place]  [id number]  COLONOSCOPY PROCEDURE REPORT  PATIENT:  Anita, [name]  MR#:  [id number] BIRTHDATE:  [birth year], 65 yrs. old  GENDER:  female ENDOSCOPIST:  Wilhemina Bonito. Eda Keys, MD REF. BY:  Lennette Bihari, M.D. PROCEDURE DATE:  10/21/2011 PROCEDURE:  Colonoscopy for control of bleeding, Colon w/ endoscopic clipping, Colonoscopy with submucosal injection - epinephrine ASA CLASS:  Class III INDICATIONS:  bright red bleeding ; post polypectomy bleed MEDICATIONS:   Fentanyl 50 mcg IV, Versed 6.5 mg IV  DESCRIPTION OF PROCEDURE:   After the risks benefits and alternatives of the procedure were thoroughly explained, informed consent was obtained.  Digital rectal exam was performed and revealed no abnormalities.   The a endoscope was introduced through the anus and advanced to the cecum, which was identified by both the appendix and ileocecal valve, without limitations. The quality of the prep was excellent, using Colyte.  The instrument was then slowly withdrawn as the colon was fully examined. <<PROCEDUREIMAGES>>  FINDINGS:  Several polypectomy site Ulcers in the right colon. One quite large - 3-4 cm - with clot. No active bleeding. The lesion was injected w/ 1:10,000 epi x 3.5 cc. then 5 endoclips deployed on lesion w/ 3 holding secure. Otherwise normal colonoscopy without  polyps, masses, vascular ectasias, or inflammatory changes.   Retroflexed views in the rectum revealed no abnormalities.    The scope was then withdrawn from the patient and the procedure completed.  COMPLICATIONS:  None  ENDOSCOPIC IMPRESSION: 1) Postpolypectomy Ulcers in the right colon - s/p endoscopic hemostatic therapy 2) Otherwise normal colonoscopy  RECOMMENDATIONS: 1) ICU Monitoring for evidence of rebleed  ______________________________ Wilhemina Bonito. Eda Keys, MD  CC:  n. eSIGNED:   Wilhemina Bonito. Eda Keys at 10/21/2011 02:44  PM  Roxy Cedar, [phone]

## 2011-10-21 NOTE — Progress Notes (Signed)
Pt. Refused to wear cpap.

## 2011-10-21 NOTE — Progress Notes (Signed)
Subjective:  Sleeping, no SOB.  Objective:  Vital Signs in the last 24 hours: Temp:  [98.1 F (36.7 C)-99.8 F (37.7 C)] 98.9 F (37.2 C) (05/31 0748) Pulse Rate:  [56-79] 64  (05/31 0700) Resp:  [10-24] 24  (05/31 0700) BP: (101-161)/(26-45) 101/34 mmHg (05/31 0700) SpO2:  [96 %-100 %] 100 % (05/31 0700)  Intake/Output from previous day:  Intake/Output Summary (Last 24 hours) at 10/21/11 0836 Last data filed at 10/21/11 0600  Gross per 24 hour  Intake 2383.1 ml  Output   3280 ml  Net -896.9 ml    Physical Exam: General appearance: no distress, mildly obese and sleepy Lungs: clear to auscultation bilaterally Heart: regular rate and rhythm   Rate: 68  Rhythm: normal sinus rhythm  Lab Results:  Basename 10/21/11 0645 10/20/11 2038  WBC 10.4 7.0  HGB 8.3* 8.7*  PLT 133* 160    Basename 10/21/11 0645 10/20/11 0420  NA 132* 140  K 4.9 3.9  CL 102 104  CO2 18* 21  GLUCOSE 226* 75  BUN 41* 46*  CREATININE 1.72* 1.50*    Basename 10/20/11 1635 10/20/11 0700  TROPONINI <0.30 0.31*   Hepatic Function Panel No results found for this basename: PROT,ALBUMIN,AST,ALT,ALKPHOS,BILITOT,BILIDIR,IBILI in the last 72 hours  Basename 10/20/11 0420  CHOL 172    Basename 10/20/11 0420  INR 1.14    Imaging: Imaging results have been reviewed  Cardiac Studies:  Assessment/Plan:   Principal Problem:  *Chest pain, presenting complaint Active Problems:  Hemorrhage of rectum and anus, polypectomy 10/12/11  Acute posthemorrhagic anemia, trnasfused multiple units PRBC  Pulmonary HTN-severe- due to diastolic dysfunction  Streptococcal endocarditis, 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  Normal coronary angiogram, 10/20/11  Nl LVF  Myositis, chronic steroids  Chronic renal insufficiency, stage III (moderate)  HYPOTHYROIDISM  HYPERLIPIDEMIA, hx of statin induced myosistis  HYPERTENSIVE CARDIOVASCULAR DISEASE  Obstructive sleep apnea on nocturnal CPAP  Elevated troponin  Diabetes mellitus  RAS (renal artery stenosis), s/p renal artery PTA X 2   Plan- she did well yesterday afternoon but later in evening she had multiple bloody stools.  She was transfused one unit yesterday PM and two units last night and her HGB is down to 8.3. Cardiac status is stable, she has no significant CAD and NL LVF. She is on ABs for endocarditis.  I will stop her Lasix and K+ for now as there is no CHF on exam or by CXR and her SCr and K+ are drifting up. She is for colonoscopy today.  Corine Shelter PA-C 10/21/2011, 8:36 AM  Attending Attestation: Pt.seen & examined, chart reviewed.  Did OK yesterday & cath reviewed.  Unclear of etiology of CP. Unfortunately has continued to have dark hematochezia - more episodes this AM.  Most recent episode with syncope - most likely vasovagal, although BP & HR have remained stable.  Hgb continues to decline, was getting Go Lytely for Bowel Prep - planned Colonoscopy today.  Have d/w GI - plan NGT with Go-Lytely via NGT to complete prep. Continue to transfuse - units. ?use of Desmopressin; not on any further anticoagulation.  Continues to be stable from cardiac standpoint with non-obstructive CAD & normal LV function.  On Abx for SBE.  Concerning situation with progressive GIBld - need bowel prep to visualize.  Hope for Colonoscopy today -- supported measures.  Marykay Lex, M.D., M.S. [facility] [address] [place], [place]  [address]  [phone] Pager # [phone]  10/21/2011 10:52 AM

## 2011-10-21 NOTE — Progress Notes (Signed)
Called back to CCU after Ms Palladino had a syncopal while on the commode getting GI prep for colonoscopy. B/P stable. HR stable. GI notified. Awaiting colonoscopy.  Corine Shelter PA-C 10/21/2011 10:42 AM  Patient seen at time of large (bed pan full) bloody stool. See progress note for additional information.

## 2011-10-21 NOTE — Progress Notes (Signed)
Patient up to bedside commode with assistance. Husband at bedside while I left the room briefly to get some linen to change her bed. Upon returning to the room patient was found lying in the floor. The only injury noted was a small abrasion on her left knee. MD aware and family aware. Emotional support provided to patient and family.

## 2011-10-21 NOTE — Progress Notes (Signed)
Patient assisted to bedside commode. Patient became unresponsive. After 5 minutes of sternal rub. Patient was aroused, but lethargic. BP 109/86 (92), HR 87, Pulse ox 100% on 4L, RR 26. Patient was physically lifted by nursing staff from bedside commode and placed in bed. MD paged; orders given.

## 2011-10-21 NOTE — Progress Notes (Signed)
Pt bleeding more in last hour.  Syncope while on commode. Had gotten 2 units blood overnight.  Spoke with Corine Shelter, PA he will order 2 more units of blood Ordered NGT placed, and 4 litres additional Nulytely infused.  Pt has PICC and another peripheral IV in place.   Latest vitals 120/32, pulse in 80s.   Plan to try to get intestine clear of blood so colonoscopy with hemostatic therapy can get done this afternoon.  [name] PA-c.   GI ATTENDING  STILL BLEEDING. SYNCOPE. HAVE ORDERED NGT W/ RAPID LAVAGE AS WELL AS BLOOD FOR HEMORRHAGIC ANEMIA. COLONOSCOPY NOW. DISCUSSED W/ PT AND HUSBAND.  [name]. Eda Keys., M.D. [facility] Division of Gastroenterology

## 2011-10-21 NOTE — OR Nursing (Signed)
Case done at bedside. Recovered in ICU> 

## 2011-10-21 NOTE — Progress Notes (Signed)
Contacted by RN with regard to bloody BMs over the last 3-4 hours. When seen by GI consult service today, bleeding had stopped, but she had another bloody BM at 1500 on 10/20/11, then 4 larger volume bloody BMs recently. Pt with nausea and no vomiting.  She received antiemetics. VSS at present. Repeat Hgb performed at 2100 and down to 8.7 Will plan additional 2 units of pRBC now, given ongoing bleeding and begin colonoscopy prep (otherwise NPO) Heparin remains off. Most likely etiology remains post-polypectomy bleeding.

## 2011-10-21 NOTE — Op Note (Signed)
COLONOSCOPY W/ EPI INJECTION AND CLIPPING. BLEEDING STOPPED. SEE PENTAX REPORT  [name]. Eda Keys., M.D. [facility] Division of Gastroenterology

## 2011-10-21 NOTE — Progress Notes (Signed)
     [name] Daily Rounding Note 10/21/2011, 8:31 AM  SUBJECTIVE:       More bleeding overnightand gotmore PRBC, 3so far.  Prepping for colonoscopy but now nauseated and has 1/4 of prep remaining. No SOB or chest pain  OBJECTIVE:        General: looks ill and depressed     Vital signs in last 24 hours:    Temp:  [98.1 F (36.7 C)-99.8 F (37.7 C)] 98.9 F (37.2 C) (05/31 0748) Pulse Rate:  [56-79] 64  (05/31 0700) Resp:  [10-24] 24  (05/31 0700) BP: (101-161)/(26-45) 101/34 mmHg (05/31 0700) SpO2:  [96 %-100 %] 100 % (05/31 0700) Last BM Date: 10/20/11  Heart: RRR Chest: clear Abdomen: NT, activeBS,obese, soft  Extremities: slight pedal edema Neuro/Psych:  Non confused, depressed.  Intake/Output from previous day: 05/30 0701 - 05/31 0700 In: 2403.7 [P.O.:660; I.V.:727.2; Blood:962.5; IV Piggyback:54] Out: 3430 [Stool:3430]  Intake/Output this shift:    Lab Results:  Basename 10/21/11 0645 10/20/11 2038 10/20/11 1635  WBC 10.4 7.0 7.4  HGB 8.3* 8.7* 9.4*  HCT 23.4* 26.1* 28.0*  PLT 133* 160 165   BMET  Basename 10/21/11 0645 10/20/11 0420 10/19/11 1506  NA 132* 140 136  K 4.9 3.9 4.5  CL 102 104 101  CO2 18* 21 20  GLUCOSE 226* 75 86  BUN 41* 46* 49*  CREATININE 1.72* 1.50* 1.43*  CALCIUM 7.5* 8.8 8.9   LFT No results found for this basename: PROT:3,ALBUMIN:3,AST:3,ALT:3,ALKPHOS:3,BILITOT:3,BILIDIR:3,IBILI:3 in the last 72 hours PT/INR  Basename 10/20/11 0420 10/19/11 1506  LABPROT 14.8 13.6  INR 1.14 1.02   Hepatitis Panel No results found for this basename: HEPBSAG,HCVAB,HEPAIGM,HEPBIGM in the last 72 hours  Studies/Results: Dg Chest 2 View  10/19/2011  *RADIOLOGY REPORT*  Clinical Data: Shortness of breath and chest pain  CHEST - 2 VIEW  Comparison: 10/09/2011  Findings: There is a right arm PICC line with tip in the SVC.  The heart size and mediastinal contours are within normal limits.  Both lungs are clear.  The visualized skeletal  structures are unremarkable.  IMPRESSION: Negative examination.  Original Report Authenticated By: Rosealee Albee, M.D.    ASSESMENT: * Painless hematochezia. Propable post-polypectomy bleed in setting of Heparin use.  Further bleeding late yesterday.  Prepped for colonoscopy today but latest stool this AM still bloody.    *  Non STEMI.  Cath 5/30:  No signif CAD, no obvious cause for MI.  Off heparin.  *  Anemia.  ABL.  S/p PRBC x  *  Myositis, chronic prednisone.     PLAN: *  Complete prep with tap water enema(s)   LOS: 2 days   [name]  10/21/2011, 8:31 AM Pager: [phone]   [name] ATTENDING  AGREE. URGENT COLONOSCOPY FOR ACUTE BLEEDING  Brenisha Tsui N. Eda Keys., M.D. [facility] Division of Gastroenterology

## 2011-10-22 LAB — CBC
MCH: 30 pg (ref 26.0–34.0)
MCHC: 35.6 g/dL (ref 30.0–36.0)
MCHC: 35.6 g/dL (ref 30.0–36.0)
MCV: 84.3 fL (ref 78.0–100.0)
MCV: 85.4 fL (ref 78.0–100.0)
Platelets: 131 10*3/uL — ABNORMAL LOW (ref 150–400)
Platelets: 121 10*3/uL — ABNORMAL LOW (ref 150–400)
RDW: 15.4 % (ref 11.5–15.5)
WBC: 10.2 10*3/uL (ref 4.0–10.5)

## 2011-10-22 LAB — GLUCOSE, CAPILLARY
Glucose-Capillary: 144 mg/dL — ABNORMAL HIGH (ref 70–99)
Glucose-Capillary: 295 mg/dL — ABNORMAL HIGH (ref 70–99)
Glucose-Capillary: 143 mg/dL — ABNORMAL HIGH (ref 70–99)

## 2011-10-22 LAB — PREPARE RBC (CROSSMATCH)

## 2011-10-22 MED ORDER — HYDRALAZINE HCL 25 MG PO TABS
25.0000 mg | ORAL_TABLET | Freq: Three times a day (TID) | ORAL | Status: DC
  Administered 2011-10-22 – 2011-10-25 (×9): 25 mg via ORAL
  Filled 2011-10-22 (×13): qty 1

## 2011-10-22 NOTE — Progress Notes (Signed)
Pt. Stated she did not want to wear her CPAP machine. Said if she felt like she needed it she would get her nurse to call RT. RT will continue to monitor.

## 2011-10-22 NOTE — Progress Notes (Signed)
Pt had another 200cc bloody stool.  Dr. Marina Goodell notified, orders received for 2 additional units of PRBCs to be transfused and to draw H & H now.  VSS.  Will continue to monitor.

## 2011-10-22 NOTE — Progress Notes (Signed)
Noted transfer orders from [facility], [place].  Pt has had several bloody bowel movements since Dr. Tresa Endo saw pt this AM and hgb is 7.3.  Per Dr. Marina Goodell pt is being transfused 2 units PRBC.  Notified Joesph Fillers, PA of change in pt status and transfer order D/C'd.  Will continue to monitor.

## 2011-10-22 NOTE — Progress Notes (Signed)
Patient ID: ORAL HALLGREN, female   DOB: 1946-03-22, 66 y.o.   MRN: [id number] [facility] Progress Note  Subjective: Had done well all night,this am about one hour ago she passed about 200cc of dark blood. No c/o pain. Hgb 7.3  Objective:  Vital signs in last 24 hours: Temp:  [97.9 F (36.6 C)-99.6 F (37.6 C)] 98.8 F (37.1 C) (06/01 0815) Pulse Rate:  [49-76] 70  (06/01 1000) Resp:  [16-24] 16  (05/31 1900) BP: (90-164)/(30-65) 137/40 mmHg (06/01 1000) SpO2:  [95 %-100 %] 100 % (06/01 1000) Weight:  [180 lb 12.4 oz (82 kg)] 180 lb 12.4 oz (82 kg) (06/01 0500) Last BM Date: 10/21/11 General:   Alert,  Well-developed,    in NAD Heart:  Regular rate and rhythm; no murmurs Pulm;clear Abdomen:  Soft, nontender and nondistended. Normal bowel sounds, without guarding Extremities:  Without edema. Neurologic:  Alert and  oriented x4;  grossly normal neurologically. Psych:  Alert and cooperative. Normal mood and affect.  Intake/Output from previous day: 05/31 0701 - 06/01 0700 In: 1575 [I.V.:1500; Blood:25; IV Piggyback:50] Out: 2100 [Urine:1100; Stool:1000] Intake/Output this shift: Total I/O In: 345 [P.O.:120; I.V.:225] Out: 2 [Stool:2]  Lab Results:  Basename 10/22/11 1000 10/22/11 0400 10/21/11 2231  WBC 10.2 14.7* 7.7  HGB 7.3* 8.8* 8.6*  HCT 20.5* 24.7* 24.3*  PLT 121* 131* 122*   BMET  Basename 10/21/11 0645 10/20/11 0420 10/19/11 1506  NA 132* 140 136  K 4.9 3.9 4.5  CL 102 104 101  CO2 18* 21 20  GLUCOSE 226* 75 86  BUN 41* 46* 49*  CREATININE 1.72* 1.50* 1.43*  CALCIUM 7.5* 8.8 8.9   LFT No results found for this basename: PROT,ALBUMIN,AST,ALT,ALKPHOS,BILITOT,BILIDIR,IBILI in the last 72 hours PT/INR  Basename 10/20/11 0420 10/19/11 1506  LABPROT 14.8 13.6  INR 1.14 1.02    Assessment / Plan: #1 66 yo female with acute post polypectomy bleed in setting of Plavix.  She had multiple ulcers in right colon from polypectomies, and largest  was endoclipped and injected yesterday with good hemostasis. ?rebleeding- one episode this am, hgb drifting. She is off antiplatelets,on ASA Observe closely, repeat colonoscopy if shows further  signs of bleeding  #2 Anemia-post hemorrhagic- will transfuse 2 more units now. 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 Agree with above assessment and plan as well as H&P. Husband in room.Feels well. Exam benign. She appears to have had some recurrent bleeding without hemodynamic compromise. Agree with current plans for supportive care with additional blood products. Hopefully this will settle down. If not, or if she becomes hemodynamically compromised, would proceed with rapid lavage and repeat colonoscopy. If this were to fail, other options might include selective angiography or even surgery. Discussed with patient and husband.  Wilhemina Bonito. Marina Goodell,  Montez Hageman., M.D. [facility] Division of Gastroenterology

## 2011-10-22 NOTE — Progress Notes (Signed)
[facility]  Subjective: Feeling stronger every day.  No further CP.  Objective: Vital signs in last 24 hours: Temp:  [97.9 F (36.6 C)-99.6 F (37.6 C)] 98.8 F (37.1 C) (06/01 0815) Pulse Rate:  [49-86] 65  (06/01 0800) Resp:  [9-26] 16  (05/31 1900) BP: (90-183)/(28-86) 164/38 mmHg (06/01 0400) SpO2:  [95 %-100 %] 98 % (06/01 0800) Weight:  [82 kg (180 lb 12.4 oz)] 82 kg (180 lb 12.4 oz) (06/01 0500) Last BM Date: 10/21/11  Intake/Output from previous day: 05/31 0701 - 06/01 0700 In: 1575 [I.V.:1500; Blood:25; IV Piggyback:50] Out: 2100 [Urine:1100; Stool:1000] Intake/Output this shift: Total I/O In: 75 [I.V.:75] Out: -   Medications Current Facility-Administered Medications  Medication Dose Route Frequency Provider Last Rate Last Dose  . 0.9 %  sodium chloride infusion   Intravenous Continuous Dianah Field, PA 75 mL/hr at 10/21/11 2257    . acetaminophen (TYLENOL) tablet 650 mg  650 mg Oral Q4H PRN Thurmon Fair, MD   650 mg at 10/22/11 0439  . ALPRAZolam Prudy Feeler) tablet 0.5 mg  0.5 mg Oral QHS PRN Nada Boozer, NP   0.5 mg at 10/21/11 0048  . amLODipine (NORVASC) tablet 10 mg  10 mg Oral Daily Nada Boozer, NP   10 mg at 10/21/11 0905  . aspirin EC tablet 81 mg  81 mg Oral Daily Nada Boozer, NP   81 mg at 10/21/11 0906  . cefTRIAXone (ROCEPHIN) 2 g in dextrose 5 % 50 mL IVPB  2 g Intravenous Q24H Nada Boozer, NP   2 g at 10/21/11 1958  . escitalopram (LEXAPRO) tablet 20 mg  20 mg Oral Daily Nada Boozer, NP   20 mg at 10/21/11 0905  . insulin aspart (novoLOG) injection 0-9 Units  0-9 Units Subcutaneous TID WC Nada Boozer, NP   1 Units at 10/22/11 0836  . insulin aspart (novoLOG) injection 5 Units  5 Units Subcutaneous TID WC Nada Boozer, NP   5 Units at 10/21/11 1741  . levothyroxine (SYNTHROID, LEVOTHROID) tablet 112 mcg  112 mcg Oral Q breakfast Nada Boozer, NP   112 mcg at 10/21/11 0900  . nebivolol (BYSTOLIC) tablet 2.5 mg  2.5  mg Oral Daily Nada Boozer, NP   2.5 mg at 10/21/11 1000  . nitroGLYCERIN 0.2 mg/mL in dextrose 5 % infusion  10 mcg/min Intravenous Titrated Nada Boozer, NP   2 mcg/min at 10/19/11 1951  . omega-3 acid ethyl esters (LOVAZA) capsule 2 g  2 g Oral BID Nada Boozer, NP   2 g at 10/21/11 0906  . ondansetron (ZOFRAN) injection 4 mg  4 mg Intravenous Q6H PRN Thurmon Fair, MD   4 mg at 10/21/11 0354  . oxyCODONE-acetaminophen (PERCOCET) 5-325 MG per tablet 1-2 tablet  1-2 tablet Oral Q4H PRN Mihai Croitoru, MD      . pantoprazole (PROTONIX) EC tablet 40 mg  40 mg Oral Q1200 Lennette Bihari, MD   40 mg at 10/20/11 1107  . polyethylene glycol-electrolytes (NuLYTELY/GoLYTELY) solution 4,000 mL  4,000 mL Oral Once Dianah Field, PA   4,000 mL at 10/21/11 1130  . predniSONE (DELTASONE) tablet 20 mg  20 mg Oral Q breakfast Nada Boozer, NP   20 mg at 10/21/11 0900  . promethazine (PHENERGAN) injection 12.5 mg  12.5 mg Intravenous Q6H PRN Abelino Derrick, PA   12.5 mg at 10/21/11 0454  . DISCONTD: 0.9 %  sodium chloride infusion  1 mL/kg/hr Intravenous Continuous Mihai Croitoru,  MD   0.99 mL/kg/hr at 10/20/11 1600  . DISCONTD: EPINEPHrine 1:10,000, 10 mL syringe/NS, 10 mL vial for sclerotherapy inj mixture    PRN Hilarie Fredrickson, MD      . DISCONTD: fentaNYL NICU IV Syringe 50 mcg/mL    PRN Hilarie Fredrickson, MD   25 mcg at 10/21/11 1405  . DISCONTD: midazolam (VERSED) injection    PRN Hilarie Fredrickson, MD   2 mg at 10/21/11 1406    PE: General appearance: alert, cooperative and no distress Lungs: clear to auscultation bilaterally Heart: regular rate and rhythm, S1, S2 normal, no murmur, click, rub or gallop Abdomen: +BS, nontender Extremities: No LEE Pulses: 2+ and symmetric Skin: warm and dry  Lab Results:   Basename 10/22/11 0400 10/21/11 2231 10/21/11 1740  WBC 14.7* 7.7 9.6  HGB 8.8* 8.6* 9.5*  HCT 24.7* 24.3* 26.9*  PLT 131* 122* 127*   BMET  Basename 10/21/11 0645 10/20/11 0420 10/19/11 1506  NA  132* 140 136  K 4.9 3.9 4.5  CL 102 104 101  CO2 18* 21 20  GLUCOSE 226* 75 86  BUN 41* 46* 49*  CREATININE 1.72* 1.50* 1.43*  CALCIUM 7.5* 8.8 8.9   PT/INR  Basename 10/20/11 0420 10/19/11 1506  LABPROT 14.8 13.6  INR 1.14 1.02   Cholesterol  Basename 10/20/11 0420  CHOL 172   Cardiac Enzymes No components found with this basename: TROPONIN:3, CKMB:3  Studies/Results: COLONOSCOPY PROCEDURE REPORT  PATIENT: Anita, [name] MR#: [id number]  BIRTHDATE: 1945-11-28, 65 yrs. old GENDER: female  ENDOSCOPIST: Wilhemina Bonito. Eda Keys, MD  REF. BY: Lennette Bihari, M.D.  PROCEDURE DATE: 10/21/2011  PROCEDURE: Colonoscopy for control of bleeding,  Colon w/ endoscopic clipping,  Colonoscopy with submucosal  injection - epinephrine  ASA CLASS: Class III  INDICATIONS: bright red bleeding ; post polypectomy bleed  MEDICATIONS: Fentanyl 50 mcg IV, Versed 6.5 mg IV  DESCRIPTION OF PROCEDURE: After the risks benefits and  alternatives of the procedure were thoroughly explained, informed  consent was obtained. Digital rectal exam was performed and  revealed no abnormalities. The a endoscope was introduced  through the anus and advanced to the cecum, which was identified  by both the appendix and ileocecal valve, without limitations.  The quality of the prep was excellent, using Colyte. The  instrument was then slowly withdrawn as the colon was fully  examined.  <<PROCEDUREIMAGES>>  FINDINGS: Several polypectomy site Ulcers in the right colon. One  quite large - 3-4 cm - with clot. No active bleeding. The lesion  was injected w/ 1:10,000 epi x 3.5 cc. then 5 endoclips deployed  on lesion w/ 3 holding secure. Otherwise normal colonoscopy  without polyps, masses, vascular ectasias, or inflammatory  changes. Retroflexed views in the rectum revealed no  abnormalities. The scope was then withdrawn from the patient  and the procedure completed.  COMPLICATIONS: None  ENDOSCOPIC IMPRESSION:  1)  Postpolypectomy Ulcers in the right colon - s/p endoscopic  hemostatic therapy  2) Otherwise normal colonoscopy  RECOMMENDATIONS:  1) ICU Monitoring for evidence of rebleed  ______________________________  Wilhemina Bonito. 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   HGB stable.  BP 90/61 - 183/42.  Last 164/38.  S/P left heart cath 10/20/11 with no significant disease noted.   On low dose BB. HR low is 49.  Will avoid ACE/ARb due to CKD.  Will add hydralazine 25mg  tid. May be able to transfer to telemetry.     LOS: 3 days    HAGER, BRYAN 10/22/2011 8:54 AM   Patient seen and examined. Agree with assessment and plan. No chest pain or further rectal bleeding.  Continue IV fluids with Cr increased to 1.72. Follow H/H if hb falls below 8 consider additional PRBC Tx.  OK to transfer to telemetry.   Lennette Bihari, MD, [facility] 10/22/2011 9:25 AM

## 2011-10-23 ENCOUNTER — Encounter (HOSPITAL_COMMUNITY)
Admission: EM | Disposition: A | Discharge status: Home Health | Payer: Self-pay | Source: Home / Self Care | Attending: Internal Medicine

## 2011-10-23 ENCOUNTER — Encounter (HOSPITAL_COMMUNITY): Payer: Self-pay | Admitting: Internal Medicine

## 2011-10-23 HISTORY — PX: COLONOSCOPY: SHX5424

## 2011-10-23 LAB — HEMOGLOBIN AND HEMATOCRIT, BLOOD
HCT: 22.1 % — ABNORMAL LOW (ref 36.0–46.0)
HCT: 30.9 % — ABNORMAL LOW (ref 36.0–46.0)
HCT: 25.2 % — ABNORMAL LOW (ref 36.0–46.0)
Hemoglobin: 11.3 g/dL — ABNORMAL LOW (ref 12.0–15.0)
Hemoglobin: 8.9 g/dL — ABNORMAL LOW (ref 12.0–15.0)

## 2011-10-23 LAB — GLUCOSE, CAPILLARY: Glucose-Capillary: 205 mg/dL — ABNORMAL HIGH (ref 70–99)

## 2011-10-23 LAB — BASIC METABOLIC PANEL
CO2: 20 mEq/L (ref 19–32)
Calcium: 7.2 mg/dL — ABNORMAL LOW (ref 8.4–10.5)
Chloride: 110 mEq/L (ref 96–112)
Glucose, Bld: 140 mg/dL — ABNORMAL HIGH (ref 70–99)
Sodium: 138 mEq/L (ref 135–145)

## 2011-10-23 SURGERY — COLONOSCOPY
Anesthesia: Moderate Sedation

## 2011-10-23 MED ORDER — AZITHROMYCIN 500 MG PO TABS
500.0000 mg | ORAL_TABLET | Freq: Every day | ORAL | Status: AC
  Administered 2011-10-23: 500 mg via ORAL
  Filled 2011-10-23: qty 1

## 2011-10-23 MED ORDER — FENTANYL NICU IV SYRINGE 50 MCG/ML
INJECTION | INTRAMUSCULAR | Status: DC | PRN
  Administered 2011-10-23 (×4): 25 ug via INTRAVENOUS

## 2011-10-23 MED ORDER — MIDAZOLAM HCL 10 MG/2ML IJ SOLN
INTRAMUSCULAR | Status: AC
  Filled 2011-10-23: qty 2

## 2011-10-23 MED ORDER — ONDANSETRON HCL 4 MG/2ML IJ SOLN
4.0000 mg | INTRAMUSCULAR | Status: DC | PRN
  Administered 2011-10-24 – 2011-10-28 (×3): 4 mg via INTRAVENOUS
  Filled 2011-10-23 (×3): qty 2

## 2011-10-23 MED ORDER — FENTANYL CITRATE 0.05 MG/ML IJ SOLN
INTRAMUSCULAR | Status: AC
  Filled 2011-10-23: qty 2

## 2011-10-23 MED ORDER — EPINEPHRINE HCL 0.1 MG/ML IJ SOLN
INTRAMUSCULAR | Status: AC
  Filled 2011-10-23: qty 10

## 2011-10-23 MED ORDER — PEG 3350-KCL-NA BICARB-NACL 420 G PO SOLR
4000.0000 mL | Freq: Once | ORAL | Status: AC
  Administered 2011-10-23: 2000 mL via ORAL
  Filled 2011-10-23: qty 4000

## 2011-10-23 MED ORDER — MIDAZOLAM HCL 10 MG/2ML IJ SOLN
INTRAMUSCULAR | Status: DC | PRN
  Administered 2011-10-23 (×4): 1 mg via INTRAVENOUS
  Administered 2011-10-23 (×2): 2 mg via INTRAVENOUS

## 2011-10-23 MED ORDER — FENTANYL CITRATE 0.05 MG/ML IJ SOLN
12.5000 ug | INTRAMUSCULAR | Status: DC | PRN
  Administered 2011-10-23: 12.5 ug via INTRAVENOUS
  Administered 2011-10-24 – 2011-10-27 (×3): 25 ug via INTRAVENOUS
  Filled 2011-10-23 (×3): qty 2

## 2011-10-23 MED ORDER — KETOROLAC TROMETHAMINE 15 MG/ML IJ SOLN
15.0000 mg | Freq: Four times a day (QID) | INTRAMUSCULAR | Status: DC | PRN
  Filled 2011-10-23: qty 1

## 2011-10-23 MED ORDER — AZITHROMYCIN 250 MG PO TABS
250.0000 mg | ORAL_TABLET | Freq: Every day | ORAL | Status: DC
  Administered 2011-10-24: 250 mg via ORAL
  Filled 2011-10-23 (×2): qty 1

## 2011-10-23 MED ORDER — SODIUM CHLORIDE 0.9 % IJ SOLN
INTRAMUSCULAR | Status: DC | PRN
  Administered 2011-10-23: 13:00:00

## 2011-10-23 MED ORDER — FENTANYL CITRATE 0.05 MG/ML IJ SOLN
INTRAMUSCULAR | Status: AC
  Administered 2011-10-23: 12.5 ug via INTRAVENOUS
  Filled 2011-10-23: qty 2

## 2011-10-23 NOTE — Op Note (Signed)
[facility] [address] [place], [place]  [id number]  COLONOSCOPY PROCEDURE REPORT  PATIENT:  [name], [name]  MR#:  [id number] BIRTHDATE:  Oct 21, 1945, 65 yrs. old  GENDER:  female ENDOSCOPIST:  Wilhemina Bonito. Eda Keys, MD REF. BY:  Lennette Bihari, M.D. PROCEDURE DATE:  10/23/2011 PROCEDURE:  Colonoscopy for control of bleeding, Colonoscopy with submucosal injection - epi 3.5cc- Colon w/ endoscopic clipping x 1 ASA CLASS:  Class III INDICATIONS:  hematochezia ; treated 2 days ago for post-polypectomy bleed MEDICATIONS:   Fentanyl 100 mcg IV, Versed 8 mg IV  DESCRIPTION OF PROCEDURE:   After the risks benefits and alternatives of the procedure were thoroughly explained, informed consent was obtained.  Digital rectal exam was performed and revealed no abnormalities.   The Pentax Colonoscope [id number] endoscope was introduced through the anus and advanced to the cecum, which was identified by both the appendix and ileocecal valve, without limitations.  The quality of the prep was excellent, using Colyte.  The instrument was then slowly withdrawn as the colon was fully examined. <<PROCEDUREIMAGES>>  FINDINGS:  No active bleeding. Multiple Ulcers in the right colon from prior polypectomies. The previously treated ulcer looked fine with clips in place.Another ulcer with "red spots" was injected. A third ulcer with vissible vessel  (felt to be the culprit) was injected with epi - 2cc- and clipped x 1..  Otherwise normal colonoscopy without other polyps, masses, vascular ectasias, or inflammatory changes.   Retroflexed views in the rectum revealed not done.    The scope was then withdrawn from the patient and the procedure completed.  COMPLICATIONS:  None ENDOSCOPIC IMPRESSION: 1) Ulcers in the right colon - treated as described 2) Otherwise nl colonoscopy  RECOMMENDATIONS: 1) ICU Monitoring  ______________________________ Wilhemina Bonito. Eda Keys,  MD  CC:  n. eSIGNED:   Wilhemina Bonito. Eda Keys at 10/23/2011 01:39 PM  Lindaann Slough, [phone]

## 2011-10-23 NOTE — Progress Notes (Signed)
[facility] [place] TEAM 1 - Stepdown/ICU TEAM  I was contacted by Wilburt Finlay, PA with [facility] who asked if [facility] would be willing to assume care of this patient, in that her active issues at this moment are now medical in nature.    I have reviewed her record, and agree that transfer to our service would be most appropriate.  We will assume care of now, and compile a formal note in the morning.    Lonia Blood, MD [facility] Office  [phone] Pager [phone]  On-Call/Text Page:      [email or web address]      password [facility]

## 2011-10-23 NOTE — Op Note (Signed)
SEE PENTAX REPORT. SEPARATE ULCER WITH VISIBLE VESSEL IDENTIFIED AND TREATED WITH EPI INJECTION AND CLIPPING. HEMOSTASIS ACHIEVED.

## 2011-10-23 NOTE — Progress Notes (Signed)
[facility] [place] TEAM 1 - Stepdown/ICU TEAM  The pt is hemodynamically stable, but has proven to be very labor intensive for the nursing staff due to her multiple and frequent transfusions.  I have been asked by the 2900 staff to consider a transfer to the ICU side given the labor intensive nature of her care.  I feel this is appropriate, in that the issues requiring the frequent attention are ones that place her at a high risk for rapid an severe decline.  Lonia Blood, MD [facility] Office  [phone] Pager [phone]  On-Call/Text Page:      [email or web address]      password [email or web address]

## 2011-10-23 NOTE — Progress Notes (Signed)
[facility]  Subjective: Complaining of "sharp" right ear pain.  Objective: Vital signs in last 24 hours: Temp:  [97.4 F (36.3 C)-98.7 F (37.1 C)] 97.7 F (36.5 C) (06/02 0334) Pulse Rate:  [54-71] 58  (06/02 0000) Resp:  [16-20] 18  (06/01 2330) BP: (103-144)/(29-57) 125/32 mmHg (06/02 0622) SpO2:  [96 %-100 %] 100 % (06/02 0315) Weight:  [81.8 kg (180 lb 5.4 oz)] 81.8 kg (180 lb 5.4 oz) (06/02 0500) Last BM Date: 10/23/11  Intake/Output from previous day: 06/01 0701 - 06/02 0700 In: 3609.2 [P.O.:360; I.V.:1880; Blood:1319.2; IV Piggyback:50] Out: 1275 [Stool:1275] Intake/Output this shift: Total I/O In: 75 [I.V.:75] Out: -   Medications Current Facility-Administered Medications  Medication Dose Route Frequency Provider Last Rate Last Dose  . 0.9 %  sodium chloride infusion   Intravenous Continuous Dianah Field, PA 75 mL/hr at 10/23/11 0125    . ALPRAZolam Prudy Feeler) tablet 0.5 mg  0.5 mg Oral QHS PRN Nada Boozer, NP   0.5 mg at 10/22/11 2330  . amLODipine (NORVASC) tablet 10 mg  10 mg Oral Daily Nada Boozer, NP   10 mg at 10/22/11 0954  . aspirin EC tablet 81 mg  81 mg Oral Daily Nada Boozer, NP   81 mg at 10/22/11 0954  . cefTRIAXone (ROCEPHIN) 2 g in dextrose 5 % 50 mL IVPB  2 g Intravenous Q24H Nada Boozer, NP   2 g at 10/22/11 1933  . escitalopram (LEXAPRO) tablet 20 mg  20 mg Oral Daily Nada Boozer, NP   20 mg at 10/22/11 0953  . hydrALAZINE (APRESOLINE) tablet 25 mg  25 mg Oral Q8H Wilburt Finlay, PA   25 mg at 10/23/11 0454  . insulin aspart (novoLOG) injection 0-9 Units  0-9 Units Subcutaneous TID WC Nada Boozer, NP   5 Units at 10/22/11 1716  . insulin aspart (novoLOG) injection 5 Units  5 Units Subcutaneous TID WC Nada Boozer, NP   5 Units at 10/22/11 1724  . levothyroxine (SYNTHROID, LEVOTHROID) tablet 112 mcg  112 mcg Oral Q breakfast Nada Boozer, NP   112 mcg at 10/22/11 0954  . nebivolol (BYSTOLIC) tablet 2.5 mg  2.5 mg  Oral Daily Nada Boozer, NP   2.5 mg at 10/22/11 0953  . omega-3 acid ethyl esters (LOVAZA) capsule 2 g  2 g Oral BID Nada Boozer, NP   2 g at 10/22/11 2215  . ondansetron (ZOFRAN) injection 4 mg  4 mg Intravenous Q6H PRN Thurmon Fair, MD   4 mg at 10/21/11 0354  . pantoprazole (PROTONIX) EC tablet 40 mg  40 mg Oral Q1200 Lennette Bihari, MD   40 mg at 10/22/11 1240  . predniSONE (DELTASONE) tablet 20 mg  20 mg Oral Q breakfast Nada Boozer, NP   20 mg at 10/22/11 0954  . promethazine (PHENERGAN) injection 12.5 mg  12.5 mg Intravenous Q6H PRN Abelino Derrick, PA   12.5 mg at 10/22/11 2104  . DISCONTD: acetaminophen (TYLENOL) tablet 650 mg  650 mg Oral Q4H PRN Thurmon Fair, MD   650 mg at 10/22/11 0439  . DISCONTD: nitroGLYCERIN 0.2 mg/mL in dextrose 5 % infusion  10 mcg/min Intravenous Titrated Nada Boozer, NP   2 mcg/min at 10/19/11 1951  . DISCONTD: oxyCODONE-acetaminophen (PERCOCET) 5-325 MG per tablet 1-2 tablet  1-2 tablet Oral Q4H PRN Thurmon Fair, MD        PE: General appearance: alert, cooperative and no distress Lungs: clear to auscultation bilaterally and  No Rales or wheeze Heart: regular rate and rhythm, S1, S2 normal, no murmur, click, rub or gallop Abdomen: +BS, nontender Extremities: No LEE Pulses: 2+ and symmetric 1+ DPs Right Ear:  Tympanic membrane eryhthematous in the superior aspect.  Blue tubes in place bilaterally.  Lab Results:   Basename 10/23/11 0525 10/23/11 10/22/11 1738 10/22/11 1000 10/22/11 0400 10/21/11 2231  WBC -- -- -- 10.2 14.7* 7.7  HGB 8.0* 9.0* 8.4* -- -- --  HCT 22.1* 24.9* 23.5* -- -- --  PLT -- -- -- 121* 131* 122*   BMET  Basename 10/23/11 0525 10/21/11 0645  NA 138 132*  K 4.6 4.9  CL 110 102  CO2 20 18*  GLUCOSE 140* 226*  BUN 30* 41*  CREATININE 1.29* 1.72*  CALCIUM 7.2* 7.5*   Hemoglobin 8.8, 7.3, 8.4, 9.0, 8.0(Last)  Studies/Results:  Assessment/Plan   Principal Problem:  *Chest pain, presenting complaint Active  Problems:  HYPOTHYROIDISM  HYPERLIPIDEMIA, hx of statin induced myosistis  HYPERTENSIVE CARDIOVASCULAR DISEASE  Obstructive sleep apnea on nocturnal CPAP  Elevated troponin  Pulmonary HTN-severe- due to diastolic dysfunction  Streptococcal endocarditis, 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 Hgb continues to decrease.  Now 8.0.  Needs repeat colonoscopy today. She is likely developing an ear infection in the right ear.  She said the pain started two days ago. She has been getting rocephin.  Will add iv toradol for pain.  More PRBCs have just been ordered.  BP is better controlled.  Will ask [facility] to take over since her current issues are noncardiac.  S/P left heart cath 10/20/11 with no significant disease noted.     LOS: 4 days    HAGER, BRYAN 10/23/2011 8:16 AM  Patient seen and examined. Agree with assessment and plan. For repeat colonoscopy today for recurrent GI bleeding. Has received at least 8 units of PRBC.  No chest pain. No fever. Agree with transfer to hospitalist service if they agree.   Lennette Bihari, MD, [facility] 10/23/2011 9:18 AM

## 2011-10-23 NOTE — Progress Notes (Signed)
Patient ID: [name], female   DOB: 01-09-1946, 66 y.o.   MRN: [id number] [facility] Progress Note  Subjective: Unfortunately has continued to bleed,several Bm's thru the night  blood with clots-pt discouraged. No abdominal pain   Some cramping. No chest pain.Dizzy when up  Objective:  Vital signs in last 24 hours: Temp:  [97.4 F (36.3 C)-98.7 F (37.1 C)] 98 F (36.7 C) (06/02 0845) Pulse Rate:  [54-71] 69  (06/02 0845) Resp:  [16-20] 18  (06/01 2330) BP: (103-144)/(29-57) 118/31 mmHg (06/02 0845) SpO2:  [96 %-100 %] 100 % (06/02 0315) Weight:  [180 lb 5.4 oz (81.8 kg)] 180 lb 5.4 oz (81.8 kg) (06/02 0500) Last BM Date: 10/23/11 General:   Alert,  Well-developed,    in NAD Heart:  Regular rate and rhythm; no murmurs Pulm;clear Abdomen:  Soft, nontender and nondistended. Normal bowel sounds Extremities:  Without edema. Neurologic:  Alert and  oriented x4;  grossly normal neurologically. Psych:  Alert and cooperative. Normal mood and affect.  Intake/Output from previous day: 06/01 0701 - 06/02 0700 In: 3609.2 [P.O.:360; I.V.:1880; Blood:1319.2; IV Piggyback:50] Out: 1275 [Stool:1275] Intake/Output this shift: Total I/O In: 75 [I.V.:75] Out: -   Lab Results:  Basename 10/23/11 0525 10/23/11 10/22/11 1738 10/22/11 1000 10/22/11 0400 10/21/11 2231  WBC -- -- -- 10.2 14.7* 7.7  HGB 8.0* 9.0* 8.4* -- -- --  HCT 22.1* 24.9* 23.5* -- -- --  PLT -- -- -- 121* 131* 122*   BMET  Basename 10/23/11 0525 10/21/11 0645  NA 138 132*  K 4.6 4.9  CL 110 102  CO2 20 18*  GLUCOSE 140* 226*  BUN 30* 41*  CREATININE 1.29* 1.72*  CALCIUM 7.2* 7.5*      Assessment / Plan: #1 66 yo female with persistent post polypectomy bleed -s/p 4 units yesterday and hgb 8 this am receiving 2 more units . Will give rapid bowel prep -half gallon of golytley now then colonoscopy at bedside.discussed with pt and husband- if not successful  Then arteriogram vs  surgery. Transfuse to keep hgb 8-9.  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 NEEDS REPEAT COLONOSCOPY WITH ATTEMPTED HEMOSTASIS.  Wilhemina Bonito. Eda Keys., M.D. [facility] Division of Gastroenterology

## 2011-10-23 NOTE — OR Nursing (Signed)
Case done at bedside. Recovered in ICU> 

## 2011-10-23 NOTE — Progress Notes (Signed)
Pt has continued to have bloody stools with clots; pt has incontinence with these stools as they come on her without warning; pt states that she has "cramp" like sensations in abdomen; vital signs have remained stable; H&H has dropped since midnight; however, Hgb = 8.0 at this time; pt states that she is dizzy and feels like the room goes dark and is hard to focus when getting up to the [facility]; will continue to monitor

## 2011-10-24 DIAGNOSIS — K922 Gastrointestinal hemorrhage, unspecified: Secondary | ICD-10-CM

## 2011-10-24 DIAGNOSIS — E871 Hypo-osmolality and hyponatremia: Secondary | ICD-10-CM

## 2011-10-24 DIAGNOSIS — R079 Chest pain, unspecified: Secondary | ICD-10-CM

## 2011-10-24 DIAGNOSIS — IMO0002 Reserved for concepts with insufficient information to code with codable children: Principal | ICD-10-CM

## 2011-10-24 LAB — TYPE AND SCREEN
Unit division: 0
Unit division: 0
Unit division: 0
Unit division: 0

## 2011-10-24 LAB — HEMOGLOBIN AND HEMATOCRIT, BLOOD
HCT: 25.1 % — ABNORMAL LOW (ref 36.0–46.0)
Hemoglobin: 9 g/dL — ABNORMAL LOW (ref 12.0–15.0)

## 2011-10-24 LAB — CBC
HCT: 24.3 % — ABNORMAL LOW (ref 36.0–46.0)
Hemoglobin: 8.8 g/dL — ABNORMAL LOW (ref 12.0–15.0)
MCH: 29.3 pg (ref 26.0–34.0)
MCHC: 36.2 g/dL — ABNORMAL HIGH (ref 30.0–36.0)
MCV: 81 fL (ref 78.0–100.0)
RBC: 3 MIL/uL — ABNORMAL LOW (ref 3.87–5.11)

## 2011-10-24 LAB — GLUCOSE, CAPILLARY: Glucose-Capillary: 178 mg/dL — ABNORMAL HIGH (ref 70–99)

## 2011-10-24 MED ORDER — INSULIN ASPART 100 UNIT/ML ~~LOC~~ SOLN
0.0000 [IU] | Freq: Every day | SUBCUTANEOUS | Status: DC
  Administered 2011-10-24: 3 [IU] via SUBCUTANEOUS

## 2011-10-24 MED ORDER — INSULIN GLARGINE 100 UNIT/ML ~~LOC~~ SOLN
36.0000 [IU] | Freq: Every day | SUBCUTANEOUS | Status: DC
  Administered 2011-10-24 – 2011-10-29 (×6): 36 [IU] via SUBCUTANEOUS

## 2011-10-24 MED ORDER — INSULIN ASPART 100 UNIT/ML ~~LOC~~ SOLN
0.0000 [IU] | Freq: Three times a day (TID) | SUBCUTANEOUS | Status: DC
  Administered 2011-10-25: 7 [IU] via SUBCUTANEOUS

## 2011-10-24 MED ORDER — PREDNISONE 10 MG PO TABS
10.0000 mg | ORAL_TABLET | Freq: Every day | ORAL | Status: DC
  Filled 2011-10-24 (×2): qty 1

## 2011-10-24 NOTE — Progress Notes (Signed)
Patient seen, examined, and I agree with the above documentation, including the assessment and plan. No further evidence of ongoing or recurrent bleeding. S/p endoclip on visible vessel at post-polypectomy site yesterday. Hgb stable today. Likely transferring out of stepdown soon.

## 2011-10-24 NOTE — Progress Notes (Signed)
[facility] - Assumption of Care Note [place] TEAM 1 - Stepdown/ICU TEAM  [facility] has assumed care of this patient.  Subjective:  Interim hx: 66 y.o. female with IDDM and multiple problems. 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 On schedule for 5/28 out pt screening colonoscopy, off Plavix. However, she was readmitted 5/19 with non-cardiac chest/epigastric pain and had Colon/EGD on 5/22. Several sessile colon polyps (adenomas) removed, polypectomy sites fulgurated, area of polyposis tattood for future reference. EGD was normal. Discharged home 5/23 on 81 mg ASA but not Plavix.  Readmitted 5/29. Has had near constant chest pain since discharge, but developed acute SOB, n/v, sweats day of her re-admit. New lateral ischemia on EKG, Troponin and MB elevated: Non-STEMI. Started on Heparin drip starting with bolus dose.  She began passing painless hematochezia while anticoagulated.  She underwent a cardiac cath per [facility] on 10/20/2011 revealing no evidence of significant coronary stenoses that might explain an acute coronary syndrome. [facility] has been following as well, and the pt has been taken for ednoscopy twice, each time revealing postpolypectomy ulcers in the right colon.  The pt is s/p endoscopic  hemostatic therapy x2, and has required a large number of PRBC transfusions.    Today the pt is alert and reports feels much better than she has in several days. No further bleeding apreciated. States she is very hungry and would like to eat.  Denies cp, f/c, n/v, or abdom pain.   Objective: Blood pressure 131/49, pulse 58, temperature 98.7 F (37.1 C), temperature source Oral, resp. rate 16, height 5\' 6"  (1.676 m), weight 84.6 kg (186 lb 8.2 oz), SpO2 97.00%.  Intake/Output from previous day: 06/02 0701 - 06/03 0700 In: 3174.2 [P.O.:620;  I.V.:1850; Blood:654.2; IV Piggyback:50] Out: 950 [Stool:950] Intake/Output this shift: Total I/O In: 225 [I.V.:225] Out: -   General appearance: alert, cooperative, appears stated age and no distress Resp: clear to auscultation bilaterally, 2 L nasal cannula oxygen Cardio: regular rate and rhythm, S1, S2 normal, no murmur, click, rub or gallop GI: soft, non-tender; bowel sounds normal; no masses,  no organomegaly Extremities: extremities normal, atraumatic, no cyanosis or edema Neurologic: Grossly normal  Lab Results:  Basename 10/24/11 1125 10/24/11 0537 10/22/11 1000  WBC 10.4 -- 10.2  HGB 8.8* 9.0* --  HCT 24.3* 25.1* --  PLT 118* -- 121*   BMET  Basename 10/23/11 0525  NA 138  K 4.6  CL 110  CO2 20  GLUCOSE 140*  BUN 30*  CREATININE 1.29*  CALCIUM 7.2*   Medications: I have reviewed the patient's current medications.  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 this admission and patient has been deemed stable from a cardiac standpoint  Elevated troponin *Related to demand ischemia-see above  Acute posthemorrhagic anemia, transfused multiple units PRBC *Has received a total of 11 units of packed red blood cells this admission *Current hemoglobin stable at 8.8 *No further bleeding for greater than 24 hours - continue to monitor  Colon ulcer related to recent polypectomy procedure *Appreciate gastroenterology assistantance *We'll advance diet *Status post colonoscopy on 10/21/2011 as well as 10/23/2011 with endoscopic intervention to stop the bleeding *Prior colonoscopy and EGD on 10/12/2011 - polypectomy at that time shows adenomatous polyps *Primary gastroenterology physician to followup with after  discharge will be Dr. 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 *Continue IV Rocephin and azithromycin  Diabetes mellitus *CBGs well controlled *Continue sliding scale insulin  Chronic renal insufficiency, stage III (moderate)/ RAS (renal artery stenosis), s/p renal artery PTA X 2 *Stable with adequate urinary output  Syncope *Noncardiac etiology and likely related to hypotension related to anemia  HYPERLIPIDEMIA, hx of statin induced myosistis  HYPERTENSIVE CARDIOVASCULAR DISEASE *See above regarding usual home medications  COPD  *COPD compensated without wheezing or shortness of breath *Continue oxygen need to clarify if was on chronic oxygen at home  Myositis, chronic steroids *Continue prednisone 20 mg daily  Disposition *Remain in step down to continue to observe for recurrence of bleeding *Endorses does not have a primary care physician   LOS: 5 days   Junious Silk, ANP pager [phone]  [facility]-team 1 [email or web address] Password: [email or web address]  10/24/2011, 12:39 PM  I have personally examined this patient and reviewed the entire database. I have reviewed the above note, made any necessary editorial changes, and agree with its content.  Lonia Blood, MD [facility]

## 2011-10-24 NOTE — Progress Notes (Signed)
10/24/2011 2200  Dr. Christella Hartigan called and informed of continued bloody liq stools, now with clots. Prbcs started. CBC one hour post transfusion to be called if < 9. Sudais Banghart, Linnell Fulling

## 2011-10-24 NOTE — Progress Notes (Addendum)
Subjective:  No SOB, no chest pain  Objective:  Vital Signs in the last 24 hours: Temp:  [97.3 F (36.3 C)-98.6 F (37 C)] 98.2 F (36.8 C) (06/03 0752) Pulse Rate:  [49-72] 55  (06/03 0752) Resp:  [16-20] 16  (06/03 0013) BP: (109-160)/(30-59) 137/36 mmHg (06/03 0700) SpO2:  [93 %-100 %] 93 % (06/03 0752) Weight:  [84.6 kg (186 lb 8.2 oz)] 84.6 kg (186 lb 8.2 oz) (06/03 0600)  Intake/Output from previous day:  Intake/Output Summary (Last 24 hours) at 10/24/11 0854 Last data filed at 10/24/11 0600  Gross per 24 hour  Intake 3024.17 ml  Output    950 ml  Net 2074.17 ml    Physical Exam: General appearance: alert, cooperative and no distress Lungs: clear to auscultation bilaterally Heart: regular rate and rhythm   Rate: 62  Rhythm: normal sinus rhythm and sinus bradycardia  Lab Results:  Basename 10/24/11 0537 10/23/11 2321 10/22/11 1000 10/22/11 0400  WBC -- -- 10.2 14.7*  HGB 9.0* 8.9* -- --  PLT -- -- 121* 131*    Basename 10/23/11 0525  NA 138  K 4.6  CL 110  CO2 20  GLUCOSE 140*  BUN 30*  CREATININE 1.29*   No results found for this basename: TROPONINI:2,CK,MB:2 in the last 72 hours Hepatic Function Panel No results found for this basename: PROT,ALBUMIN,AST,ALT,ALKPHOS,BILITOT,BILIDIR,IBILI in the last 72 hours No results found for this basename: CHOL in the last 72 hours No results found for this basename: INR in the last 72 hours  Imaging: Imaging results have been reviewed  Cardiac Studies:  Assessment/Plan:   Principal Problem:  *Chest pain, presenting complaint Active Problems:  Hemorrhage of rectum and anus, polypectomy 10/12/11  Acute posthemorrhagic anemia, trnasfused multiple units PRBC  Pulmonary HTN-severe- due to diastolic dysfunction  Streptococcal endocarditis, 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 She looks stable from a cardiac standpoint. In the past HTN has been a real problem and her meds may need to be adjusted before discharge. We will continue to follow.  Corine Shelter PA-C 10/24/2011, 8:54 AM  I have seen & examined the patient this AM.  She does thankfully, appear to be more stable.  H/H is lower than post-transfusion (reflecting the true extent of GI bleed, as stable for 2 readings).   In light of her serious GI Bleeding concern - agree with withholding Plavix for the near (if not indefinite) future.    BP is stable for now on current medications - easiest to titrate up would be Hydralazine. Continues to be on IV Abx for recent Endocarditis.  Otherwise, she remains stable from a Cardiac standpoint with Non-obstructive CAD on cath.  I very much appreciate [facility] Service assuming Attending status in order to ensure MD night-time coverage in the case of recurrent GI emergency.    Marykay Lex, M.D., M.S. [facility] [address] [place], [place]  [address]  [phone] Pager # [phone]  10/24/2011 11:36 AM

## 2011-10-24 NOTE — Progress Notes (Signed)
Pt. Refuses CPAP at this time. All vitals are stable at this time.

## 2011-10-24 NOTE — Progress Notes (Signed)
Text paged Dr Sharon Seller that patient had a large amout of liquid bloody stool and that I would also notify GI. Called  answering service and left message for on-call MD. Received call back from Dr Christella Hartigan who ordered 1 unit RBC and recheck H&H post transfusion.

## 2011-10-24 NOTE — Progress Notes (Signed)
     [facility] Daily Rounding Note 10/24/2011, 9:25 AM  SUBJECTIVE:       Stools brown this AM.  Walking to bathroom and not dizzy or weak.  No chest pain.  Hungry, on clears. Wondering when she might realistically d/c home.   OBJECTIVE:        General: Looks better, not well     Vital signs in last 24 hours:    Temp:  [97.3 F (36.3 C)-98.6 F (37 C)] 98.2 F (36.8 C) (06/03 0752) Pulse Rate:  [49-72] 55  (06/03 0752) Resp:  [16-20] 16  (06/03 0013) BP: (109-160)/(30-59) 137/36 mmHg (06/03 0700) SpO2:  [93 %-100 %] 93 % (06/03 0752) Weight:  [186 lb 8.2 oz (84.6 kg)] 186 lb 8.2 oz (84.6 kg) (06/03 0600) Last BM Date: 10/23/11  Heart: RRR.  No MRG Chest: clear B.  No cough or labored breathing Abdomen: soft, obese, NT, ND  Extremities: slight pedal edema Neuro/Psych:  Pleasant, less anxious. Brighter affect  Intake/Output from previous day: 06/02 0701 - 06/03 0700 In: 3099.2 [P.O.:620; I.V.:1775; Blood:654.2; IV Piggyback:50] Out: 950 [Stool:950]  Intake/Output this shift:    Lab Results:  Basename 10/24/11 0537 10/23/11 2321 10/23/11 1746 10/22/11 1000 10/22/11 0400 10/21/11 2231  WBC -- -- -- 10.2 14.7* 7.7  HGB 9.0* 8.9* 11.3* -- -- --  HCT 25.1* 25.2* 30.9* -- -- --  PLT -- -- -- 121* 131* 122*   BMET  Basename 10/23/11 0525  NA 138  K 4.6  CL 110  CO2 20  GLUCOSE 140*  BUN 30*  CREATININE 1.29*  CALCIUM 7.2*   ASSESMENT: *  Acute post polypectomy LGIB.  S/P 2 colonoscopies, 5/31 and 6/2, both with endoscopic therapies to stop bleeding *  ABL anemia, s/p multiple PRBC transfusions.  Hgb stable for > 24 hours.  *  Adenomatous colon polyps, normal EGD 5/22.  Dr Leone Payor is her GI doc. *  Chronic renal failure *  Non STEMI, cardiac cath last week with mild to moderate CAD *  Chronic Plavix.  Has been on hold for at least 10 days. Indication is Hx renal artery stents.  Last stent > one year ago.  *  Strep endocarditis.  *  Thrombocytopenia.   PLAN: *   Continue to observe for signs of re-bleeding *  Do not restart Plavix.  *  CBC in AM *  Continue Protonix for hx GERD sxs (Nexium at Home)   LOS: 5 days   [name]  10/24/2011, 9:25 AM Pager: [phone]

## 2011-10-25 ENCOUNTER — Encounter (HOSPITAL_COMMUNITY)
Admission: EM | Disposition: A | Discharge status: Home Health | Payer: Self-pay | Source: Home / Self Care | Attending: Internal Medicine

## 2011-10-25 ENCOUNTER — Encounter (HOSPITAL_COMMUNITY): Payer: Self-pay | Admitting: Internal Medicine

## 2011-10-25 DIAGNOSIS — R079 Chest pain, unspecified: Secondary | ICD-10-CM

## 2011-10-25 DIAGNOSIS — K922 Gastrointestinal hemorrhage, unspecified: Secondary | ICD-10-CM

## 2011-10-25 DIAGNOSIS — E871 Hypo-osmolality and hyponatremia: Secondary | ICD-10-CM

## 2011-10-25 HISTORY — PX: COLONOSCOPY: SHX5424

## 2011-10-25 LAB — CBC
HCT: 27.1 % — ABNORMAL LOW (ref 36.0–46.0)
HCT: 26.4 % — ABNORMAL LOW (ref 36.0–46.0)
Hemoglobin: 9.8 g/dL — ABNORMAL LOW (ref 12.0–15.0)
Hemoglobin: 9.5 g/dL — ABNORMAL LOW (ref 12.0–15.0)
MCHC: 36 g/dL (ref 30.0–36.0)
MCV: 81.7 fL (ref 78.0–100.0)
Platelets: 144 10*3/uL — ABNORMAL LOW (ref 150–400)
RBC: 2.6 MIL/uL — ABNORMAL LOW (ref 3.87–5.11)
RDW: 16 % — ABNORMAL HIGH (ref 11.5–15.5)
WBC: 16.1 10*3/uL — ABNORMAL HIGH (ref 4.0–10.5)
WBC: 16 10*3/uL — ABNORMAL HIGH (ref 4.0–10.5)

## 2011-10-25 LAB — PREPARE RBC (CROSSMATCH)

## 2011-10-25 LAB — GLUCOSE, CAPILLARY
Glucose-Capillary: 266 mg/dL — ABNORMAL HIGH (ref 70–99)
Glucose-Capillary: 202 mg/dL — ABNORMAL HIGH (ref 70–99)
Glucose-Capillary: 135 mg/dL — ABNORMAL HIGH (ref 70–99)
Glucose-Capillary: 195 mg/dL — ABNORMAL HIGH (ref 70–99)

## 2011-10-25 LAB — BASIC METABOLIC PANEL
Chloride: 109 mEq/L (ref 96–112)
GFR calc Af Amer: 45 mL/min — ABNORMAL LOW (ref >90)
Potassium: 4.6 mEq/L (ref 3.5–5.1)
Sodium: 136 mEq/L (ref 135–145)

## 2011-10-25 SURGERY — COLONOSCOPY
Anesthesia: Moderate Sedation

## 2011-10-25 MED ORDER — SODIUM CHLORIDE 0.9 % IJ SOLN
INTRAMUSCULAR | Status: DC | PRN
  Administered 2011-10-25: 16:00:00

## 2011-10-25 MED ORDER — MIDAZOLAM HCL 10 MG/2ML IJ SOLN
INTRAMUSCULAR | Status: AC
  Filled 2011-10-25: qty 4

## 2011-10-25 MED ORDER — HYDROCORTISONE SOD SUCCINATE 100 MG IJ SOLR
50.0000 mg | Freq: Every day | INTRAMUSCULAR | Status: DC
  Administered 2011-10-25: 50 mg via INTRAVENOUS
  Administered 2011-10-26: 10:00:00 via INTRAVENOUS
  Filled 2011-10-25 (×2): qty 1

## 2011-10-25 MED ORDER — FENTANYL CITRATE 0.05 MG/ML IJ SOLN
INTRAMUSCULAR | Status: AC
  Filled 2011-10-25: qty 4

## 2011-10-25 MED ORDER — FENTANYL NICU IV SYRINGE 50 MCG/ML
INJECTION | INTRAMUSCULAR | Status: DC | PRN
  Administered 2011-10-25 (×4): 25 ug via INTRAVENOUS

## 2011-10-25 MED ORDER — PEG 3350-KCL-NABCB-NACL-NASULF 236 G PO SOLR
4000.0000 mL | Freq: Once | ORAL | Status: AC
  Administered 2011-10-25: 4000 mL via ORAL
  Filled 2011-10-25: qty 4000

## 2011-10-25 MED ORDER — EPINEPHRINE HCL 0.1 MG/ML IJ SOLN
INTRAMUSCULAR | Status: AC
  Filled 2011-10-25: qty 10

## 2011-10-25 MED ORDER — INSULIN ASPART 100 UNIT/ML ~~LOC~~ SOLN
0.0000 [IU] | SUBCUTANEOUS | Status: DC
  Administered 2011-10-25: 2 [IU] via SUBCUTANEOUS

## 2011-10-25 MED ORDER — HYDRALAZINE HCL 20 MG/ML IJ SOLN: 10.0000 mg | INTRAMUSCULAR | Status: DC | PRN

## 2011-10-25 MED ORDER — MIDAZOLAM HCL 5 MG/5ML IJ SOLN
INTRAMUSCULAR | Status: DC | PRN
  Administered 2011-10-25 (×4): 2.5 mg via INTRAVENOUS

## 2011-10-25 MED ORDER — METOPROLOL TARTRATE 1 MG/ML IV SOLN
2.5000 mg | Freq: Three times a day (TID) | INTRAVENOUS | Status: DC
  Administered 2011-10-25 – 2011-10-26 (×3): 2.5 mg via INTRAVENOUS
  Filled 2011-10-25 (×7): qty 5

## 2011-10-25 MED ORDER — DEXTROSE 5 % IV SOLN
500.0000 mg | INTRAVENOUS | Status: DC
  Administered 2011-10-25 – 2011-10-26 (×2): 500 mg via INTRAVENOUS
  Filled 2011-10-25 (×2): qty 500

## 2011-10-25 NOTE — Progress Notes (Signed)
Patient seen, examined, and I agree with the above documentation, including the assessment and plan. Unfortunately she had had additional hematochezia, all felt secondary to post-polypectomy hemorrhage. Last colon Sunday (2 days ago) with endo-clip in left colon. She had prepped again and cleared. Will plan colonoscopy now.

## 2011-10-25 NOTE — Progress Notes (Signed)
10/25/2011 1:47 AM Continue to have red rectal bleeding with clots. H/H called to Dr. Christella Hartigan. Orders received to transfuse 2 more PRBC's each over 2 hours.   Darrel Gloss, Linnell Fulling

## 2011-10-25 NOTE — Progress Notes (Signed)
Pt. Refuses CPAP at this time. All vitals are within normal limits.

## 2011-10-25 NOTE — Progress Notes (Signed)
Pt. Refuses CPAP at this time. Pt. Stated that she felt fine without it. Told pt. To call RT if she changed her mind.

## 2011-10-25 NOTE — Progress Notes (Signed)
     [facility] Daily Rounding Note 10/25/2011, 8:26 AM  SUBJECTIVE:       Began passing blood again last night.  Got 3 more units PRBCs. Feels weak.  Reluctant but willing to prep for yet another colonoscopy.   OBJECTIVE:        General: looks ill     Vital signs in last 24 hours:    Temp:  [97.5 F (36.4 C)-98.9 F (37.2 C)] 98.7 F (37.1 C) (06/04 0800) Pulse Rate:  [50-130] 61  (06/04 0800) Resp:  [16-18] 16  (06/04 0300) BP: (90-151)/(24-77) 127/34 mmHg (06/04 0818) SpO2:  [93 %-100 %] 99 % (06/04 0800) Weight:  [184 lb 4.9 oz (83.6 kg)] 184 lb 4.9 oz (83.6 kg) (06/04 0600) Last BM Date: 10/24/11  Heart: RRR  Not tachy Chest: clear, not SOB Abdomen: soft, NT, ND, hypoactive BS  Extremities: no pitting edema. Neuro/Psych:  Pleasant, depressed, not confused.   Lab Results:  Basename 10/25/11 0116 10/24/11 1125 10/24/11 0537 10/22/11 1000  WBC 16.1* 10.4 -- 10.2  HGB 7.5* 8.8* 9.0* --  HCT 21.6* 24.3* 25.1* --  PLT 144* 118* -- 121*   BMET  Basename 10/23/11 0525  NA 138  K 4.6  CL 110  CO2 20  GLUCOSE 140*  BUN 30*  CREATININE 1.29*  CALCIUM 7.2*   ASSESMENT: *  Recurrent lower gi bleed from polypectomy site.  2 prior colonoscopies with hemostatic therapy during this admit.  Poor operative candidate.  Has been receiving low dose ASA *  ABL anemia.  Received 3 units blood, about 14 units total this admit, overnight, latest Hgb was obtained during transfusions.  Awaiting results of post transfusion CBC *  Thrombocytopenia. Note 5/29 had elevated D dimer. Wonder if she has some level of DIC? *  Renal insufficiency.   PLAN: *  Stop the ASA *  Rapid prep via NGT, colonoscopy at 3:45 today.  *  Check coags.  Await this AMs CBC, transfuse to keep Hgb  > 8.0.   LOS: 6 days   [name]  10/25/2011, 8:26 AM Pager: [phone]

## 2011-10-25 NOTE — Op Note (Signed)
[facility] [address] [place], [place]  [address]  COLONOSCOPY PROCEDURE REPORT  PATIENT:  Unika, Nazareno  MR#:  [id number] BIRTHDATE:  05/20/1946, 65 yrs. old  GENDER:  female ENDOSCOPIST:  Carie Caddy. Redina Zeller, MD REF. BY:  [facility], PROCEDURE DATE:  10/25/2011 PROCEDURE:  Colonoscopy for control of bleeding, Colonoscopy with submucosal injection, Colon w/ endoscopic clipping ASA CLASS:  Class III INDICATIONS:  Gastrointestinal hemorrhage, post-polypectomy bleeding, history of adenomatous colon polyps MEDICATIONS:   Fentanyl 100 mcg IV, Versed 10 mg IV, Epinephrine 7 cc  DESCRIPTION OF PROCEDURE:   After the risks benefits and alternatives of the procedure were thoroughly explained, informed consent was obtained.  Digital rectal exam was performed and revealed no rectal masses.   The Pentax Colonoscope [id number] endoscope was introduced through the anus and advanced to the cecum, which was identified by both the appendix and ileocecal valve, without limitations.  The quality of the prep was excellent, using MoviPrep.  The instrument was then slowly withdrawn as the colon was fully examined. <<PROCEDUREIMAGES>>  FINDINGS: No active bleeding. Multiple prior polypectomy sites were identified in the ascending colon. 1 such ulceration, had to small red spots and 2 hemostatic clips were applied to these areas with success. An additional post-polypectomy ulceration was seen in the ascending colon, and had one hemostatic clip in place. This is felt to be hemostatic clip placed on 10/22/2011. The largest post-polypectomy ulceration in the ascending colon, had one hemostatic clip in place (previously there had been 3 at this location), and a large clot with visible vessel. A large portion of the overlying clot was removed, after the base was injected with epinephrine (1:10,000). An additional 3 hemostatic clips were placed over this visible vessel and  post-polypectomy ulceration. There was no bleeding during or at the end of this case. Mild diverticulosis was found in the left colon.   Retroflexed views in the rectum revealed no abnormalities.    The scope was then withdrawn in  minutes from the cecum and the procedure completed.  COMPLICATIONS:  None  ENDOSCOPIC IMPRESSION: 1) Prior polypectomy ulcerations in the ascending colon - the largest with visible vessel and adherent clot. Treated as described above. 2) Mild diverticulosis in the left colon  RECOMMENDATIONS: 1) Continue to hold aspirin and all other anticoagulation/antiplatelet therapy 2) Closely monitor hemoglobin and hematocrit and transfuse if necessary 3) If rebleeding occurs, would consult interventional radiology for consideration of angiography with embolization to the known right colon post polypectomy ulcerations. 4)  Ongoing ICU monitoring  Kincaid Tiger M. Courtne Lighty, MD  CC:  The Patient  n. eSIGNED:   Carie Caddy. Kadi Hession at 10/25/2011 05:58 PM  Roxy Cedar, [phone]

## 2011-10-25 NOTE — Progress Notes (Addendum)
[facility] - Assumption of Care Note  TEAM 1 - Stepdown/ICU TEAM  [facility] has assumed care of this patient.  Subjective:  Interim hx: 65 y.o. female with IDDM and multiple problems. 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 On schedule for 5/28 out pt screening colonoscopy, off Plavix. However, she was readmitted 5/19 with non-cardiac chest/epigastric pain and had Colon/EGD on 5/22. Several sessile colon polyps (adenomas) removed, polypectomy sites fulgurated, area of polyposis tattood for future reference. EGD was normal. Discharged home 5/23 on 81 mg ASA but not Plavix.  Readmitted 5/29. Has had near constant chest pain since discharge, but developed acute SOB, n/v, sweats day of her re-admit. New lateral ischemia on EKG, Troponin and MB elevated: Non-STEMI. Started on Heparin drip starting with bolus dose.  She began passing painless hematochezia while anticoagulated.  She underwent a cardiac cath per [facility] on 10/20/2011 revealing no evidence of significant coronary stenoses that might explain an acute coronary syndrome.  GI has been following as well, and the pt has been taken for ednoscopy twice, each time revealing postpolypectomy ulcers in the right colon.  The pt is s/p endoscopic  hemostatic therapy x2, and has required a large number of PRBC transfusions.    Unfortunately rectal bleeding recurred yesterday PM and has persisted thru the night. Has so far received 3 units of blood overnight. Dr. Christella Hartigan was on-call for GI last pm and I have discussed with Lianne Bushy, PA-C with GI this am. The patient denies abdominal pain but endorsed nausea with attempts to eat yesterday.  Objective: Blood pressure 126/31, pulse 64, temperature 97.9 F (36.6 C), temperature source Oral, resp. rate 16, height 5\' 6"  (1.676 m), weight 83.6 kg  (184 lb 4.9 oz), SpO2 94.00%.  Intake/Output from previous day: 06/03 0701 - 06/04 0700 In: 1677.5 [P.O.:120; I.V.:625; Blood:882.5; IV Piggyback:50] Out: 400  Intake/Output this shift:    General appearance: alert, cooperative, appears stated age and no distress Resp: clear to auscultation bilaterally, 2 L nasal cannula oxygen Cardio: regular rate and rhythm, S1, S2 normal, no murmur, click, rub or gallop GI: soft, non-tender; bowel sounds normal; no masses,  no organomegaly Extremities: extremities normal, atraumatic, no cyanosis or edema Neurologic: Grossly normal  Lab Results:  Basename 10/25/11 0116 10/24/11 1125  WBC 16.1* 10.4  HGB 7.5* 8.8*  HCT 21.6* 24.3*  PLT 144* 118*   BMET  Basename 10/23/11 0525  NA 138  K 4.6  CL 110  CO2 20  GLUCOSE 140*  BUN 30*  CREATININE 1.29*  CALCIUM 7.2*   Medications: I have reviewed the patient's current medications.  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 this admission and patient has been deemed stable from a cardiac standpoint  Elevated troponin *Related to demand ischemia-see above  Acute posthemorrhagic anemia, transfused multiple units PRBC *Has received a total of 14 units of packed red blood cells this admission *follow h/h Q 8 now that bleeding has restarted.   GI bleed- due to Colon ulcer related to recent polypectomy procedure *Appreciate gastroenterology assistatance- * Only on baby ASA- will hold.  * repeat colonoscopy today? Not sure if bleeding scan would be helpful *May need surgery consult to evaluate for possible colonic resection but key is locating focal source of bleeding to avoid  total colectomy *Status post colonoscopy on 10/21/2011 as well as 10/23/2011 with endoscopic intervention to stop the bleeding *Prior colonoscopy and EGD on 10/12/2011  - polypectomy at that time shows adenomatous polyps *Primary gastroenterology physician to followup with after discharge will be Dr. 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 *Continue IV Rocephin and azithromycin  Diabetes mellitus *CBGs well controlled *Continue sliding scale insulin *Continue current Lantus dose despite NPO-  On solu- cortef  Chronic renal insufficiency, stage III (moderate)/ RAS (renal artery stenosis), s/p renal artery PTA X 2 *Stable with adequate urinary output  Syncope *Noncardiac etiology and likely related to hypotension related to anemia  HYPERLIPIDEMIA, hx of statin induced myosistis  HYPERTENSIVE CARDIOVASCULAR DISEASE *See above regarding usual home medications  COPD  *COPD compensated without wheezing or shortness of breath *Continue oxygen need to clarify if was on chronic oxygen at home  Myositis, chronic steroids *Change prednisone 20 mg daily to low dose Solu-Cortef IV  Disposition *Endorses does not have a primary care physician   LOS: 6 days   Junious Silk, ANP pager [phone]  [facility] 1 [email or web address] Password: [email or web address]  10/25/2011, 8:08 AM   I have examined the patient and reviewed the chart and agree with the above note which I have modified.   Calvert Cantor, MD [phone]

## 2011-10-25 NOTE — Progress Notes (Signed)
Subjective:  No CP/SOB  Objective:  Temp:  [97.5 F (36.4 C)-98.9 F (37.2 C)] 97.9 F (36.6 C) (06/04 0700) Pulse Rate:  [50-130] 64  (06/04 0700) Resp:  [16-18] 16  (06/04 0300) BP: (90-151)/(24-77) 126/31 mmHg (06/04 0700) SpO2:  [93 %-100 %] 94 % (06/04 0700) Weight:  [83.6 kg (184 lb 4.9 oz)] 83.6 kg (184 lb 4.9 oz) (06/04 0600) Weight change: -1 kg (-2 lb 3.3 oz)  Intake/Output from previous day: 06/03 0701 - 06/04 0700 In: 1677.5 [P.O.:120; I.V.:625; Blood:882.5; IV Piggyback:50] Out: 400   Intake/Output from this shift:    Physical Exam: General appearance: alert and cooperative Neck: no adenopathy, no carotid bruit, no JVD, supple, symmetrical, trachea midline and thyroid not enlarged, symmetric, no tenderness/mass/nodules Lungs: clear to auscultation bilaterally Heart: regular rate and rhythm, S1, S2 normal, no murmur, click, rub or gallop Extremities: extremities normal, atraumatic, no cyanosis or edema  Lab Results: Results for orders placed during the hospital encounter of 10/19/11 (from the past 48 hour(s))  GLUCOSE, CAPILLARY     Status: Abnormal   Collection Time   10/23/11  8:07 AM      Component Value Range Comment   Glucose-Capillary 160 (*) 70 - 99 (mg/dL)   GLUCOSE, CAPILLARY     Status: Abnormal   Collection Time   10/23/11 12:06 PM      Component Value Range Comment   Glucose-Capillary 205 (*) 70 - 99 (mg/dL)   HEMOGLOBIN AND HEMATOCRIT, BLOOD     Status: Abnormal   Collection Time   10/23/11  3:30 PM      Component Value Range Comment   Hemoglobin 10.4 (*) 12.0 - 15.0 (g/dL) POST TRANSFUSION SPECIMEN   HCT 28.8 (*) 36.0 - 46.0 (%)   GLUCOSE, CAPILLARY     Status: Abnormal   Collection Time   10/23/11  5:35 PM      Component Value Range Comment   Glucose-Capillary 192 (*) 70 - 99 (mg/dL)   HEMOGLOBIN AND HEMATOCRIT, BLOOD     Status: Abnormal   Collection Time   10/23/11  5:46 PM      Component Value Range Comment   Hemoglobin 11.3 (*) 12.0 -  15.0 (g/dL)    HCT 96.0 (*) 45.4 - 46.0 (%)   GLUCOSE, CAPILLARY     Status: Abnormal   Collection Time   10/23/11  9:35 PM      Component Value Range Comment   Glucose-Capillary 198 (*) 70 - 99 (mg/dL)   HEMOGLOBIN AND HEMATOCRIT, BLOOD     Status: Abnormal   Collection Time   10/23/11 11:21 PM      Component Value Range Comment   Hemoglobin 8.9 (*) 12.0 - 15.0 (g/dL) DELTA CHECK NOTED   HCT 25.2 (*) 36.0 - 46.0 (%)   HEMOGLOBIN AND HEMATOCRIT, BLOOD     Status: Abnormal   Collection Time   10/24/11  5:37 AM      Component Value Range Comment   Hemoglobin 9.0 (*) 12.0 - 15.0 (g/dL)    HCT 09.8 (*) 11.9 - 46.0 (%)   GLUCOSE, CAPILLARY     Status: Abnormal   Collection Time   10/24/11  7:51 AM      Component Value Range Comment   Glucose-Capillary 149 (*) 70 - 99 (mg/dL)   CBC     Status: Abnormal   Collection Time   10/24/11 11:25 AM      Component Value Range Comment   WBC 10.4  4.0 -  10.5 (K/uL)    RBC 3.00 (*) 3.87 - 5.11 (MIL/uL)    Hemoglobin 8.8 (*) 12.0 - 15.0 (g/dL)    HCT 40.9 (*) 81.1 - 46.0 (%)    MCV 81.0  78.0 - 100.0 (fL)    MCH 29.3  26.0 - 34.0 (pg)    MCHC 36.2 (*) 30.0 - 36.0 (g/dL)    RDW 91.4 (*) 78.2 - 15.5 (%)    Platelets 118 (*) 150 - 400 (K/uL) PLATELET COUNT CONFIRMED BY SMEAR  GLUCOSE, CAPILLARY     Status: Abnormal   Collection Time   10/24/11 11:55 AM      Component Value Range Comment   Glucose-Capillary 178 (*) 70 - 99 (mg/dL)   GLUCOSE, CAPILLARY     Status: Abnormal   Collection Time   10/24/11  3:58 PM      Component Value Range Comment   Glucose-Capillary 308 (*) 70 - 99 (mg/dL)   PREPARE RBC (CROSSMATCH)     Status: Normal   Collection Time   10/24/11  7:50 PM      Component Value Range Comment   Order Confirmation ORDER PROCESSED BY BLOOD BANK     TYPE AND SCREEN     Status: Normal (Preliminary result)   Collection Time   10/24/11  7:50 PM      Component Value Range Comment   ABO/RH(D) A POS      Antibody Screen NEG      Sample Expiration  10/27/2011      Unit Number [id number]      Blood Component Type RED CELLS,LR      Unit division 00      Status of Unit ISSUED      Transfusion Status OK TO TRANSFUSE      Crossmatch Result Compatible      Unit Number [id number]      Blood Component Type RED CELLS,LR      Unit division 00      Status of Unit ISSUED      Transfusion Status OK TO TRANSFUSE      Crossmatch Result Compatible      Unit Number [id number]      Blood Component Type RED CELLS,LR      Unit division 00      Status of Unit ISSUED      Transfusion Status OK TO TRANSFUSE      Crossmatch Result Compatible      Unit Number [id number]      Blood Component Type RED CELLS,LR      Unit division 00      Status of Unit ALLOCATED      Transfusion Status OK TO TRANSFUSE      Crossmatch Result Compatible      Unit Number [id number]      Blood Component Type RED CELLS,LR      Unit division 00      Status of Unit ALLOCATED      Transfusion Status OK TO TRANSFUSE      Crossmatch Result Compatible     GLUCOSE, CAPILLARY     Status: Abnormal   Collection Time   10/24/11  9:50 PM      Component Value Range Comment   Glucose-Capillary 266 (*) 70 - 99 (mg/dL)   CBC     Status: Abnormal   Collection Time   10/25/11  1:16 AM      Component Value Range Comment   WBC 16.1 (*) 4.0 - 10.5 (K/uL)  RBC 2.60 (*) 3.87 - 5.11 (MIL/uL)    Hemoglobin 7.5 (*) 12.0 - 15.0 (g/dL)    HCT 16.1 (*) 09.6 - 46.0 (%)    MCV 83.1  78.0 - 100.0 (fL)    MCH 28.8  26.0 - 34.0 (pg)    MCHC 34.7  30.0 - 36.0 (g/dL)    RDW 04.5 (*) 40.9 - 15.5 (%)    Platelets 144 (*) 150 - 400 (K/uL)   PREPARE RBC (CROSSMATCH)     Status: Normal   Collection Time   10/25/11  2:16 AM      Component Value Range Comment   Order Confirmation ORDER PROCESSED BY BLOOD BANK     GLUCOSE, CAPILLARY     Status: Abnormal   Collection Time   10/25/11  7:48 AM      Component Value Range Comment   Glucose-Capillary 202 (*) 70 - 99 (mg/dL)     Imaging: Imaging results have  been reviewed  Assessment/Plan:   1. Principal Problem: 2.  *Chest pain, presenting complaint 3. Active Problems: 4.  HYPOTHYROIDISM 5.  HYPERLIPIDEMIA, hx of statin induced myosistis 6.  HYPERTENSIVE CARDIOVASCULAR DISEASE 7.  COPD UNSPECIFIED 8.  Obstructive sleep apnea on nocturnal CPAP 9.  Elevated troponin 10.  Pulmonary HTN-severe- due to diastolic dysfunction 11.  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  Diabetes mellitus 13.  RAS (renal artery stenosis), s/p renal artery PTA X 2 14.  Normal coronary angiogram, 10/20/11  Nl LVF 15.  Hemorrhage of rectum and anus, polypectomy 10/12/11 16.  Acute posthemorrhagic anemia, trnasfused multiple units PRBC 17.  Myositis, chronic steroids 18.  Chronic renal insufficiency, stage III (moderate) 19.  Colon ulcer related to recent polypectomy procedure 20.  Syncope 21.  Post-polypectomy bleeding 22.   Time Spent Directly with Patient:  20 minutes  Length of Stay:  LOS: 6 days   Cardiac stable. No CP/SOB. Cors nl at cath. VSS. Major issue now is active GIB from a visible vessel by colonoscopy s/p  endo clip. Hgb continues to decline with active GIB. GI service involved. Getting continued PRBCs transfusion. May require partial colectomy but will defer to GI and [facility]. Will follow along with you.    Runell Gess 10/25/2011, 8:04 AM

## 2011-10-26 ENCOUNTER — Encounter (HOSPITAL_COMMUNITY): Payer: Self-pay | Admitting: Internal Medicine

## 2011-10-26 DIAGNOSIS — R079 Chest pain, unspecified: Secondary | ICD-10-CM

## 2011-10-26 DIAGNOSIS — M7989 Other specified soft tissue disorders: Secondary | ICD-10-CM

## 2011-10-26 DIAGNOSIS — K922 Gastrointestinal hemorrhage, unspecified: Secondary | ICD-10-CM

## 2011-10-26 DIAGNOSIS — I749 Embolism and thrombosis of unspecified artery: Secondary | ICD-10-CM

## 2011-10-26 DIAGNOSIS — E871 Hypo-osmolality and hyponatremia: Secondary | ICD-10-CM

## 2011-10-26 LAB — CBC
HCT: 25 % — ABNORMAL LOW (ref 36.0–46.0)
Hemoglobin: 8.9 g/dL — ABNORMAL LOW (ref 12.0–15.0)
Hemoglobin: 8.3 g/dL — ABNORMAL LOW (ref 12.0–15.0)
MCH: 29.4 pg (ref 26.0–34.0)
MCH: 29.7 pg (ref 26.0–34.0)
MCH: 30.9 pg (ref 26.0–34.0)
MCHC: 35 g/dL (ref 30.0–36.0)
MCHC: 35.6 g/dL (ref 30.0–36.0)
MCV: 84 fL (ref 78.0–100.0)
Platelets: 124 10*3/uL — ABNORMAL LOW (ref 150–400)
Platelets: 128 10*3/uL — ABNORMAL LOW (ref 150–400)
RBC: 2.93 MIL/uL — ABNORMAL LOW (ref 3.87–5.11)
RBC: 2.69 MIL/uL — ABNORMAL LOW (ref 3.87–5.11)
WBC: 9.2 10*3/uL (ref 4.0–10.5)

## 2011-10-26 LAB — CULTURE, BLOOD (ROUTINE X 2)
Culture  Setup Time: 201305300907
Culture: NO GROWTH

## 2011-10-26 LAB — GLUCOSE, CAPILLARY
Glucose-Capillary: 71 mg/dL (ref 70–99)
Glucose-Capillary: 128 mg/dL — ABNORMAL HIGH (ref 70–99)

## 2011-10-26 MED ORDER — GLUCERNA SHAKE PO LIQD
237.0000 mL | Freq: Three times a day (TID) | ORAL | Status: DC
  Administered 2011-10-26 – 2011-10-30 (×6): 237 mL via ORAL

## 2011-10-26 MED ORDER — INSULIN ASPART 100 UNIT/ML ~~LOC~~ SOLN
0.0000 [IU] | Freq: Three times a day (TID) | SUBCUTANEOUS | Status: DC
  Administered 2011-10-26: 2 [IU] via SUBCUTANEOUS
  Administered 2011-10-27: 8 [IU] via SUBCUTANEOUS
  Administered 2011-10-27: 2 [IU] via SUBCUTANEOUS
  Administered 2011-10-27: 3 [IU] via SUBCUTANEOUS
  Administered 2011-10-28: 2 [IU] via SUBCUTANEOUS

## 2011-10-26 MED ORDER — AZITHROMYCIN 500 MG PO TABS
500.0000 mg | ORAL_TABLET | Freq: Every day | ORAL | Status: DC
  Administered 2011-10-27: 500 mg via ORAL
  Filled 2011-10-26 (×2): qty 1

## 2011-10-26 MED ORDER — SODIUM CHLORIDE 0.9 % IJ SOLN
10.0000 mL | INTRAMUSCULAR | Status: DC | PRN
  Administered 2011-10-26 – 2011-10-29 (×3): 10 mL

## 2011-10-26 MED ORDER — AMLODIPINE BESYLATE 5 MG PO TABS
5.0000 mg | ORAL_TABLET | Freq: Every day | ORAL | Status: DC
  Administered 2011-10-26 – 2011-10-29 (×4): 5 mg via ORAL
  Filled 2011-10-26 (×5): qty 1

## 2011-10-26 MED ORDER — PREDNISONE 20 MG PO TABS
20.0000 mg | ORAL_TABLET | Freq: Every day | ORAL | Status: DC
  Administered 2011-10-27: 20 mg via ORAL
  Filled 2011-10-26 (×3): qty 1

## 2011-10-26 MED ORDER — SODIUM CHLORIDE 0.45 % IV SOLN
INTRAVENOUS | Status: DC
  Administered 2011-10-26: 18:00:00 via INTRAVENOUS

## 2011-10-26 MED ORDER — NEBIVOLOL HCL 2.5 MG PO TABS
2.5000 mg | ORAL_TABLET | Freq: Every day | ORAL | Status: DC
  Administered 2011-10-26 – 2011-10-30 (×5): 2.5 mg via ORAL
  Filled 2011-10-26 (×6): qty 1

## 2011-10-26 NOTE — Progress Notes (Signed)
[facility]  [place] TEAM 1 - Stepdown/ICU TEAM  Interim hx: 66 y.o. female with IDDM and multiple problems. 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 On schedule for 5/28 out pt screening colonoscopy, off Plavix. However, she was readmitted 5/19 with non-cardiac chest/epigastric pain and had Colon/EGD on 5/22. Several sessile colon polyps (adenomas) removed, polypectomy sites fulgurated, area of polyposis tattood for future reference. EGD was normal. Discharged home 5/23 on 81 mg ASA but not Plavix.  Readmitted 5/29. Has had near constant chest pain since discharge, but developed acute SOB, n/v, sweats day of her re-admit. New lateral ischemia on EKG, Troponin and MB elevated: Non-STEMI. Started on Heparin drip starting with bolus dose.  She began passing painless hematochezia while anticoagulated.  She underwent a cardiac cath per [facility] on 10/20/2011 revealing no evidence of significant coronary stenoses that might explain an acute coronary syndrome. [facility] has been following as well, and the pt has been taken for ednoscopy twice, each time revealing postpolypectomy ulcers in the right colon.  The pt is s/p endoscopic  hemostatic therapy x2, and has required a large number of PRBC transfusions.    Subjective: No further recurrence of rectal bleeding so far. Patient endorses she is frightened to eat. She verbalize she felt that she had caused her bleeding to recur. She was reassured that this was not the case. She is now complaining of swelling and discomfort in the right hand and arm which has been progressive over the past one to 2 days but worse over the past 12 hours.  Objective: Blood pressure 143/37, pulse 62, temperature 98.9 F (37.2 C), temperature source Oral, resp. rate 17, height 5\' 6"  (1.676 m), weight 83.6 kg (184 lb 4.9 oz), SpO2  97.00%.  Intake/Output from previous day: 06/04 0701 - 06/05 0700 In: 1500 [I.V.:1200; IV Piggyback:300] Out: 600 [Urine:600] Intake/Output this shift:   General appearance: alert, cooperative, appears stated age and no distress Resp: clear to auscultation bilaterally, 2 L nasal cannula oxygen Cardio: regular rate and rhythm, S1, S2 normal, no murmur, click, rub or gallop GI: soft, non-tender; bowel sounds normal; no masses,  no organomegaly Extremities: extremities normal, atraumatic, no cyanosis, unilateral edema of the right upper extremity extending from the hand to the axilla-this is the same arm that the PICC line is in-there are no cellulitic changes and no drainage at the PICC line insertion site Neurologic: Grossly normal  Lab Results:  [facility] 10/26/11 0521 10/25/11 2159  WBC 15.1* 9.8  HGB 8.9* 8.6*  HCT 25.0* 24.6*  PLT 126* 124*   BMET  Basename 10/25/11 0930  NA 136  K 4.6  CL 109  CO2 18*  GLUCOSE 166*  BUN 28*  CREATININE 1.41*  CALCIUM 7.7*   Medications: I have reviewed the patient's current medications.  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 this admission and patient has been deemed stable from a cardiac standpoint  Elevated troponin *Related to demand ischemia-see above  Acute posthemorrhagic anemia, transfused multiple units PRBC *Has received a total of 14 units of packed red blood cells this admission *Continue to follow CBC every 12 hours *Hemoglobin remained stable so far  GI bleed- due to Colon ulcer related to recent polypectomy procedure *Appreciate gastroenterology assistatance *Baby aspirin discontinued 10/25/2011-I will  go ahead and discontinue when necessary Toradol *Third colonoscopy completed on 10/25/2011 and demonstrated 2 of the 3 clips previously placed inside of bleeding  ulcer or now not present therefore the area was re clipped *GI recommends if bleeding recurs to obtain arterial angiogram for possible embolization and right colon region which is location of polypectomy ulcer bases *Status post colonoscopy on 10/21/2011 as well as 10/23/2011 with endoscopic intervention to stop the bleeding *Prior colonoscopy and EGD on 10/12/2011 - polypectomy at that time shows adenomatous polyps *Primary gastroenterology physician to followup with after discharge will be Dr. Leone Payor  Edema right upper extremity *In same arm as PICC line *Noninvasive Doppler study today demonstrates deep as well as superficial vein thrombosis involving the axillary, brachial, basilic and cephalic veins. The ulnar and radial veins are noncompressible which may be due to swelling however deep venous thrombosis cannot be excluded. *Discontinue PICC line and replace into the left upper extremity since the patient will require long-term antibiotic therapy for endocarditis *anticoag can not be safely used due to life threatening GI bleding  Thrombocytopenia *Likely due to multiple blood transfusions and ongoing bleeding this admission *Trend was upward as of 10/25/2011 but with recurrent bleeding as well as 3 transfusions platelets have briefly decreased to 119,000 but have now trended upward to 126,000  HYPOTHYROIDISM *Continue levothyroxine  Obstructive sleep apnea on nocturnal CPAP *Refused CPAP overnight  Pulmonary HTN-severe- due to diastolic dysfunction *Continue medical therapy *On Apresoline and Imdur prior to admission *Norvasc being resumed today  Streptococcal endocarditis, 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 *Continue IV Rocephin and azithromycin  Diabetes mellitus *CBGs well controlled *Continue sliding scale insulin *Continue current Lantus dose despite NPO-  On solu- cortef  Chronic renal insufficiency, stage III (moderate)/ RAS (renal artery stenosis), s/p renal artery PTA X 2 *Stable with  adequate urinary output  Syncope *Noncardiac etiology and likely related to hypotension related to anemia  HYPERLIPIDEMIA, hx of statin induced myosistis  HYPERTENSIVE CARDIOVASCULAR DISEASE *See above regarding usual home medications  COPD  *COPD compensated without wheezing or shortness of breath *Continue oxygen need to clarify if was on chronic oxygen at home  Myositis, chronic steroids *Resume home dosage prednisone 20 mg daily  Disposition *Endorses does not have a primary care physician   LOS: 7 days   Junious Silk, ANP pager [phone]  [facility] 1 [email or web address] Password: [email or web address]  10/26/2011, 11:58 AM  I have personally examined this patient and reviewed the entire database. I have reviewed the above note, made any necessary editorial changes, and agree with its content.  Lonia Blood, MD [facility]

## 2011-10-26 NOTE — Progress Notes (Signed)
Patient seen, examined, and I agree with the above documentation, including the assessment and plan. No further rebleeding as of this morning. Full liquid diet. If she has recurrent post-polypectomy bleeding then would recommend IR consult for angiography of right colon blood supply and embolization if positive for bleeding.

## 2011-10-26 NOTE — Progress Notes (Signed)
Subjective:  No chest pain or SOB  Objective:  Vital Signs in the last 24 hours: Temp:  [98.2 F (36.8 C)-99.5 F (37.5 C)] 98.9 F (37.2 C) (06/05 0730) Pulse Rate:  [48-69] 69  (06/05 0730) Resp:  [14-20] 17  (06/05 0730) BP: (116-169)/(32-49) 149/37 mmHg (06/05 0730) SpO2:  [91 %-100 %] 94 % (06/05 0730) Weight:  [83.6 kg (184 lb 4.9 oz)] 83.6 kg (184 lb 4.9 oz) (06/05 0500)  Intake/Output from previous day:  Intake/Output Summary (Last 24 hours) at 10/26/11 0758 Last data filed at 10/26/11 0700  Gross per 24 hour  Intake   1500 ml  Output    600 ml  Net    900 ml    Physical Exam: General appearance: alert, cooperative and no distress Lungs: clear to auscultation bilaterally Heart: regular rate and rhythm   Rate: 70  Rhythm: normal sinus rhythm  Lab Results:  Basename 10/26/11 0521 10/25/11 2159  WBC 15.1* 9.8  HGB 8.9* 8.6*  PLT 126* 124*    Basename 10/25/11 0930  NA 136  K 4.6  CL 109  CO2 18*  GLUCOSE 166*  BUN 28*  CREATININE 1.41*   No results found for this basename: TROPONINI:2,CK,MB:2 in the last 72 hours Hepatic Function Panel No results found for this basename: PROT,ALBUMIN,AST,ALT,ALKPHOS,BILITOT,BILIDIR,IBILI in the last 72 hours No results found for this basename: CHOL in the last 72 hours  Basename 10/25/11 0930  INR 1.27    Imaging: Imaging results have been reviewed  Cardiac Studies:  Assessment/Plan:   Principal Problem:  *Chest pain, presenting complaint Active Problems:  Hemorrhage of rectum and anus, polypectomy 10/12/11  Acute posthemorrhagic anemia, trnasfused multiple units PRBC  Pulmonary HTN-severe- due to diastolic dysfunction  Streptococcal endocarditis, 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 Hgb this am is stable. Plan per GI. Her B/P has started to creep up, this has always been difficult to control.  Currently only on Lopressor, will add low dose Norvasc. Avoid ACE or ARB with CRI.   Corine Shelter PA-C 10/26/2011, 7:58 AM  I have seen and examined the patient along with Corine Shelter PA-C.  I have reviewed the chart, notes and new data.  I agree with PA's note.  Key new complaints: swelling of right arm Key examination changes: considerable edema of right arm, site of PICC Key new findings / data: Hgb stable around 8-9; creat 1.4  PLAN: Going for right upper extremity US to look for venous thrombosis. NOT a warfarin or heparin candidate. May need to consider PICC removal and placement of a tunneled semi-permanent catheter for IV access for long term antibiotics.  Thurmon Fair, MD, [facility] [phone] 10/26/2011, 10:26 AM

## 2011-10-26 NOTE — Progress Notes (Signed)
*  Preliminary Results* Right upper extremity venous duplex completed. Right upper extremity is positive for deep and superficial vein thrombosis involving the axillary, brachial, basilic and cephalic veins. The ulnar and radial veins are noncompressible, which may be due to swelling, however deep vein thrombosis cannot be excluded.  10/26/2011 11:10 AM  Elpidio Galea, RDMS,RDCS

## 2011-10-26 NOTE — Progress Notes (Signed)
Per MD order, PICC line removed. Cath intact at 41cm. Vaseline pressure gauze to site, pressure held x 5min. No bleeding to site. Pt instructed to keep dressing CDI x 24 hours. Avoid heavy lifting, pushing or pulling x 24 hours,  If bleeding occurs hold pressure, if bleeding does not stop contact MD or go to the ED. Pt does not have any questions.  [name] M  

## 2011-10-26 NOTE — Progress Notes (Signed)
     [name] 10/26/2011, 8:59 AM  SUBJECTIVE:       No bleeding or stool overnight.  This AM yellow, watery BM with sediment.  Feels well.  Not dizzy with walking to bathroom. Swelling in the right arm, the one with PICC in place.  Pt a bit fearful of eating solids, thinks it might cause shedding of endoclips.    OBJECTIVE:        General: pale, looks unwell but better than 24 hours ago     Vital signs in last 24 hours:    Temp:  [98.2 F (36.8 C)-99.5 F (37.5 C)] 98.9 F (37.2 C) (06/05 0730) Pulse Rate:  [48-69] 62  (06/05 0800) Resp:  [14-20] 17  (06/05 0730) BP: (116-169)/(32-49) 143/37 mmHg (06/05 0800) SpO2:  [91 %-100 %] 97 % (06/05 0800) Weight:  [184 lb 4.9 oz (83.6 kg)] 184 lb 4.9 oz (83.6 kg) (06/05 0500) Last BM Date: 10/25/11  Heart: RRR Chest: clear.  No resp distress Abdomen: soft,  NT, ND, active BS Extremities: sweeling throughout the right arm, non- tender. Neuro/Psych:  Pleasant.  Not confused or anxious.   Intake/Output from previous day: 06/04 0701 - 06/05 0700 In: 1500 [I.V.:1200; IV Piggyback:300] Out: 600 [Urine:600]  Intake/Output this shift:    Lab Results:  Basename 10/26/11 0521 10/25/11 2159 10/25/11 1410  WBC 15.1* 9.8 12.5*  HGB 8.9* 8.6* 9.5*  HCT 25.0* 24.6* 26.4*  PLT 126* 124* 121*   BMET  Basename 10/25/11 0930  NA 136  K 4.6  CL 109  CO2 18*  GLUCOSE 166*  BUN 28*  CREATININE 1.41*  CALCIUM 7.7*    PT/INR  Basename 10/25/11 0930  LABPROT 16.2*  INR 1.27    ASSESMENT: * Recurrent lower gi bleed from polypectomy site. 3rd colonoscopy with hemostatic therapy during this admit on 6/4. Poor operative candidate.  81 mg ASA d/c'd 6/4.  Off Plavix for about 3 weeks. * ABL anemia. Received 3 units blood 6/3 to 6/4, about 14 units total this admit.  H & H holding steady. * Thrombocytopenia. Slight elevation in PT, normal INR * right arm swelling.  Doppler study ordered.  Needs long term IV access  for home abx.  * Renal insufficiency. *  IDDM  *  Endocarditis.   PLAN: *  Allow full liquids.  Add Glucerna.  *  Change to q 12 hour CBC.     LOS: 7 days   [name]  10/26/2011, 8:59 AM Pager: [phone]

## 2011-10-26 NOTE — Progress Notes (Signed)
Pt refused CPAP at this time. Stated she didn't feel like she needed it. Told her to call RT if she changed her mind. RT will continue to monitor.

## 2011-10-26 NOTE — Progress Notes (Signed)
Peripherally Inserted Central Catheter/Midline Placement  The IV Nurse has discussed with the patient and/or persons authorized to consent for the patient, the purpose of this procedure and the potential benefits and risks involved with this procedure.  The benefits include less needle sticks, lab draws from the catheter and patient may be discharged home with the catheter.  Risks include, but not limited to, infection, bleeding, blood clot (thrombus formation), and puncture of an artery; nerve damage and irregular heat beat.  Alternatives to this procedure were also discussed.  PICC/Midline Placement Documentation  PICC / Midline Single Lumen 10/06/11 PICC Right Cephalic (Active)  Site Assessment Clean;Dry;Intact 10/26/2011  7:00 AM  Line Status Infusing 10/26/2011  7:00 AM  Dressing Type Transparent 10/26/2011  7:00 AM  Dressing Status Old drainage;Dry;Intact 10/26/2011  7:00 AM  Line Care Cap(s) changed;Connections checked and tightened;Line pulled back 10/26/2011  7:00 AM  Dressing Intervention New dressing;Dressing changed;Antimicrobial disc changed 10/25/2011 10:00 PM  Dressing Change Due 10/16/11 10/12/2011  5:05 AM  Indication for Insertion or Continuance of Line Home intravenous therapies (PICC only) 10/26/2011  7:00 AM       Vevelyn Pat 10/26/2011, 5:30 PM

## 2011-10-27 DIAGNOSIS — E871 Hypo-osmolality and hyponatremia: Secondary | ICD-10-CM

## 2011-10-27 DIAGNOSIS — K922 Gastrointestinal hemorrhage, unspecified: Secondary | ICD-10-CM

## 2011-10-27 DIAGNOSIS — R079 Chest pain, unspecified: Secondary | ICD-10-CM

## 2011-10-27 DIAGNOSIS — I749 Embolism and thrombosis of unspecified artery: Secondary | ICD-10-CM

## 2011-10-27 LAB — GLUCOSE, CAPILLARY
Glucose-Capillary: 68 mg/dL — ABNORMAL LOW (ref 70–99)
Glucose-Capillary: 167 mg/dL — ABNORMAL HIGH (ref 70–99)
Glucose-Capillary: 271 mg/dL — ABNORMAL HIGH (ref 70–99)

## 2011-10-27 LAB — CBC
HCT: 23.5 % — ABNORMAL LOW (ref 36.0–46.0)
Hemoglobin: 8.2 g/dL — ABNORMAL LOW (ref 12.0–15.0)
MCV: 85.5 fL (ref 78.0–100.0)
RBC: 2.75 MIL/uL — ABNORMAL LOW (ref 3.87–5.11)
WBC: 13.8 10*3/uL — ABNORMAL HIGH (ref 4.0–10.5)

## 2011-10-27 LAB — BASIC METABOLIC PANEL
CO2: 20 mEq/L (ref 19–32)
Chloride: 105 mEq/L (ref 96–112)
GFR calc Af Amer: 55 mL/min — ABNORMAL LOW (ref >90)
Potassium: 4.1 mEq/L (ref 3.5–5.1)

## 2011-10-27 NOTE — Progress Notes (Signed)
Patient seen, examined, and I agree with the above documentation, including the assessment and plan. No evidence for rebleeding at present. Hemoglobin is stable. Unfortunately, she has a new diagnosis of upper extremity DVT. At evaluation at this point is risky given her very significant post polypectomy bleeding over the last week. Agree with advancing diet

## 2011-10-27 NOTE — Progress Notes (Addendum)
[facility]  [place] TEAM 1 - Stepdown/ICU TEAM  Interim hx: 66 y.o. female with IDDM and multiple problems. 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 On schedule for 5/28 out pt screening colonoscopy, off Plavix. However, she was readmitted 5/19 with non-cardiac chest/epigastric pain and had Colon/EGD on 5/22. Several sessile colon polyps (adenomas) removed, polypectomy sites fulgurated, area of polyposis tattood for future reference. EGD was normal. Discharged home 5/23 on 81 mg ASA but not Plavix.  Readmitted 5/29. Has had near constant chest pain since discharge, but developed acute SOB, n/v, sweats day of her re-admit. New lateral ischemia on EKG, Troponin and MB elevated: Non-STEMI. Started on Heparin drip starting with bolus dose.  She began passing painless hematochezia while anticoagulated.  She underwent a cardiac cath per [facility] on 10/20/2011 revealing no evidence of significant coronary stenoses that might explain an acute coronary syndrome.  GI has been following as well, and the pt has been taken for colonoscopy 3x, each time revealing postpolypectomy ulcers in the right colon.    Subjective: Very dizzy when she walks to the bathroom. Has been advanced to regular diet but afraid to eat. Stools are loose and yellow.  She was weaned off of Prednisone in Dec. (stain induced myositis) and cannot remember who put her back on it. I have advised her to ask her husband to bring in her bottle today.  She does not use O2 at home- only CPAP at night.  She has been having sharp rt ear pain over the past 2 weeks. Had it last night and needed pain medications. Has a tube in her ear to "drain it" which was placed by and ENT doc in [place] 3 mo ago.    Objective: Blood pressure 152/39, pulse 70, temperature 99.2 F (37.3 C), temperature source  Oral, resp. rate 18, height 5\' 6"  (1.676 m), weight 83 kg (182 lb 15.7 oz), SpO2 96.00%.  Intake/Output from previous day: 06/05 0701 - 06/06 0700 In: 1964.2 [P.O.:840; I.V.:874.2; IV Piggyback:250] Out: 2454 [Urine:2450; Stool:4] Intake/Output this shift: Total I/O In: 530 [P.O.:480; I.V.:50] Out: 351 [Urine:350; Stool:1] General appearance: alert, cooperative, appears stated age and no distress Resp: clear to auscultation bilaterally- on room air Cardio: regular rate and rhythm, S1, S2 normal, no murmur, click, rub or gallop GI: soft, non-tender; bowel sounds normal; no masses,  no organomegaly Extremities: extremities normal, atraumatic, no cyanosis, unilateral edema of the right upper extremity extending from the hand to the axilla- PICC has been removed. LU arm PICC present Neurologic: Grossly normal  Lab Results:  Basename 10/27/11 0530 10/26/11 1758  WBC 13.8* 9.2  HGB 8.2* 8.3*  HCT 23.5* 22.8*  PLT 157 128*   BMET  Basename 10/27/11 0530 10/25/11 0930  NA 133* 136  K 4.1 4.6  CL 105 109  CO2 20 18*  GLUCOSE 87 166*  BUN 12 28*  CREATININE 1.19* 1.41*  CALCIUM 7.7* 7.7*   Medications: I have reviewed the patient's current medications.  Assessment/Plan:  Chest pain, presenting complaint/ Normal coronary angiogram, 10/20/11  Nl LVF *Likely related to acute anemia *No significant coronary artery disease found on catheterization this admission  ASA and Heparin d/c'd  Elevated troponin *Related to demand ischemia-see above  Acute posthemorrhagic anemia, transfused multiple units PRBC *Has received a total of 14 units of packed red blood cells this admission *currently not passing blood *last  unit transfused on 6/4  GI bleed- due to Colon ulcer related to recent polypectomy procedure *Baby aspirin discontinued 10/25/2011- - discontinued when necessary Toradol as well *Third colonoscopy completed on 10/25/2011 and demonstrated 2 of the 3 clips previously placed  inside of bleeding ulcer or now not present therefore the area was re clipped *GI recommends if bleeding recurs to obtain arterial angiogram for possible embolization and right colon region which is location of polypectomy ulcer bases *Status post colonoscopy on 10/21/2011 as well as 10/23/2011 with endoscopic intervention to stop the bleeding *Prior colonoscopy and EGD on 10/12/2011 - polypectomy done-  adenomatous polyps *Primary gastroenterology physician to followup with after discharge will be Dr. Leone Payor  Superficial venous thrombosis from PICC line in left arm *Noninvasive Doppler study today demonstrates deep as well as superficial vein thrombosis involving the axillary, brachial, basilic and cephalic veins. The ulnar and radial veins are noncompressible which may be due to swelling however deep venous thrombosis cannot be excluded. *Discontinued PICC line and replaced into the left upper extremity since the patient will require long-term antibiotic therapy for endocarditis *anticoag can not be safely used due to life threatening GI bleding  Thrombocytopenia *Likely due to multiple blood transfusions and ongoing bleeding this admission  HYPOTHYROIDISM *Continue levothyroxine  Obstructive sleep apnea on nocturnal CPAP *Refused CPAP overnight- feels she doesn't need it. - again, does not use O2 at home.   Pulmonary HTN-severe- due to diastolic dysfunction *Not yet resumed Apresoline,  Imdur and Lasix *Norvasc and Bystolic resumed 6/5- BP should be able to tolerate this today.   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  *Continue sliding scale insulin  Chronic renal insufficiency, stage III (moderate)/ RAS (renal artery stenosis), s/p renal artery PTA X 2 *Stable with adequate urinary  output D/c IVF today  Syncope *Noncardiac etiology and likely related to hypotension related to anemia  HYPERLIPIDEMIA, hx of statin induced myosistis  HYPERTENSIVE CARDIOVASCULAR DISEASE *See above regarding usual home medications  COPD  *COPD compensated without wheezing or shortness of breath *She does not use O2 at home  Myositis, chronic steroids *Resumed on home dosage prednisone 20 mg daily- need to clarify who resumed this and why as her [facility] doc weaned her off it Dec 2012 and she has not been back to [facility] since then.   Disposition Needs new PCP- She no longer wants Dr Renard Matter as her PCP - she last saw him about 1-2 weeks prior to the prior admission-  Watch in SDU for further bleeding  Calvert Cantor, MD [phone]   LOS: 8 days   10/27/2011, 9:55 AM

## 2011-10-27 NOTE — Progress Notes (Signed)
[facility]  Subjective: No SOB, CP, Abd pain  Objective: Vital signs in last 24 hours: Temp:  [98.3 F (36.8 C)-99.2 F (37.3 C)] 99.2 F (37.3 C) (06/06 0730) Pulse Rate:  [68-70] 70  (06/06 0730) Resp:  [16-20] 18  (06/06 0730) BP: (126-155)/(29-49) 152/39 mmHg (06/06 0730) SpO2:  [95 %-98 %] 96 % (06/06 0730) Weight:  [83 kg (182 lb 15.7 oz)] 83 kg (182 lb 15.7 oz) (06/06 0500) Last BM Date: 10/26/11  Intake/Output from previous day: 06/05 0701 - 06/06 0700 In: 1964.2 [P.O.:840; I.V.:874.2; IV Piggyback:250] Out: 2454 [Urine:2450; Stool:4] Intake/Output this shift:    Medications Current Facility-Administered Medications  Medication Dose Route Frequency Provider Last Rate Last Dose  . 0.45 % sodium chloride infusion   Intravenous Continuous Lonia Blood, MD 50 mL/hr at 10/26/11 1743    . ALPRAZolam Prudy Feeler) tablet 0.5 mg  0.5 mg Oral QHS PRN Nada Boozer, NP   0.5 mg at 10/26/11 1626  . amLODipine (NORVASC) tablet 5 mg  5 mg Oral Daily Eda Paschal Mason, [place]   5 mg at 10/26/11 0930  . azithromycin (ZITHROMAX) tablet 500 mg  500 mg Oral Daily Russella Dar, NP      . cefTRIAXone (ROCEPHIN) 2 g in dextrose 5 % 50 mL IVPB  2 g Intravenous Q24H Nada Boozer, NP   2 g at 10/26/11 2115  . feeding supplement (GLUCERNA SHAKE) liquid 237 mL  237 mL Oral TID BM Dianah Field, PA   237 mL at 10/26/11 2115  . fentaNYL (SUBLIMAZE) injection 12.5-25 mcg  12.5-25 mcg Intravenous Q2H PRN Lonia Blood, MD   25 mcg at 10/27/11 0518  . hydrALAZINE (APRESOLINE) injection 10 mg  10 mg Intravenous Q4H PRN Russella Dar, NP      . insulin aspart (novoLOG) injection 0-15 Units  0-15 Units Subcutaneous TID AC & HS Lonia Blood, MD   2 Units at 10/26/11 1749  . insulin glargine (LANTUS) injection 36 Units  36 Units Subcutaneous QHS Russella Dar, NP   36 Units at 10/26/11 2119  . nebivolol (BYSTOLIC) tablet 2.5 mg  2.5 mg Oral Daily Russella Dar, NP    2.5 mg at 10/26/11 1749  . omega-3 acid ethyl esters (LOVAZA) capsule 2 g  2 g Oral BID Nada Boozer, NP   2 g at 10/26/11 2115  . ondansetron (ZOFRAN) injection 4 mg  4 mg Intravenous Q4H PRN Lonia Blood, MD   4 mg at 10/25/11 1226  . pantoprazole (PROTONIX) EC tablet 40 mg  40 mg Oral Q1200 Lennette Bihari, MD   40 mg at 10/26/11 1216  . predniSONE (DELTASONE) tablet 20 mg  20 mg Oral Q breakfast Russella Dar, NP      . sodium chloride 0.9 % injection 10-40 mL  10-40 mL Intracatheter PRN Lonia Blood, MD   10 mL at 10/26/11 2309  . DISCONTD: 0.9 %  sodium chloride infusion   Intravenous Continuous Russella Dar, NP 50 mL/hr at 10/25/11 1900    . DISCONTD: azithromycin (ZITHROMAX) 500 mg in dextrose 5 % 250 mL IVPB  500 mg Intravenous Q24H Russella Dar, NP   500 mg at 10/26/11 0930  . DISCONTD: hydrocortisone sodium succinate (SOLU-CORTEF) 100 mg/2 mL injection 50 mg  50 mg Intravenous Daily Russella Dar, NP      . DISCONTD: ketorolac (TORADOL) 15 MG/ML injection 15 mg  15 mg  Intravenous Q6H PRN Wilburt Finlay, PA      . DISCONTD: metoprolol (LOPRESSOR) injection 2.5 mg  2.5 mg Intravenous Q8H Russella Dar, NP   2.5 mg at 10/26/11 0515    PE: General appearance: alert, cooperative and no distress Lungs: clear to auscultation bilaterally Heart: regular rate and rhythm and 1/6 sys MM Abdomen: +BS, soft nontender Extremities: No LEE, Right upper extremity 2+ Edema. Pulses: 2+ and symmetric  Lab Results:   Basename 10/27/11 0530 10/26/11 1758 10/26/11 0521  WBC 13.8* 9.2 15.1*  HGB 8.2* 8.3* 8.9*  HCT 23.5* 22.8* 25.0*  PLT 157 128* 126*   BMET  Basename 10/27/11 0530 10/25/11 0930  NA 133* 136  K 4.1 4.6  CL 105 109  CO2 20 18*  GLUCOSE 87 166*  BUN 12 28*  CREATININE 1.19* 1.41*  CALCIUM 7.7* 7.7*   PT/INR  Basename 10/25/11 0930  LABPROT 16.2*  INR 1.27   Cholesterol No results found for this basename: CHOL in the last 72 hours Cardiac  Enzymes No components found with this basename: TROPONIN:3, CKMB:3  Studies/Results: @RISRSLT2 @   Assessment/Plan   Principal Problem:  *Chest pain, presenting complaint Active Problems:  HYPOTHYROIDISM  HYPERLIPIDEMIA, hx of statin induced myosistis  HYPERTENSIVE CARDIOVASCULAR DISEASE  COPD UNSPECIFIED  Obstructive sleep apnea on nocturnal CPAP  Elevated troponin  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  BP better controlled.   LOS: 8 days    HAGER, BRYAN 10/27/2011 8:05 AM   Patient seen and examined. Agree with assessment and plan. Stable hemodynamics.  No further GI bleeding. No chest pain or SOB.. Will sign off; call if cardiologic follow-up needed.   Lennette Bihari, MD, [facility] 10/27/2011 8:35 AM

## 2011-10-27 NOTE — Progress Notes (Signed)
     [facility] Daily Rounding Note 10/27/2011, 8:15 AM  SUBJECTIVE:       Note extensive DVT in right arm.  PICC removed from right.  New PICC placed on left  Stool light brown.  Feeling better overall  OBJECTIVE:        General: looks better, brighter affect.  Still looks chronically unwell     Vital signs in last 24 hours:    Temp:  [98.3 F (36.8 C)-99.2 F (37.3 C)] 99.2 F (37.3 C) (06/06 0730) Pulse Rate:  [68-70] 70  (06/06 0730) Resp:  [16-20] 18  (06/06 0730) BP: (126-155)/(29-49) 152/39 mmHg (06/06 0730) SpO2:  [95 %-98 %] 96 % (06/06 0730) Weight:  [182 lb 15.7 oz (83 kg)] 182 lb 15.7 oz (83 kg) (06/06 0500) Last BM Date: 10/26/11  Heart: RRR Chest: clear B.  Not SOB Extremities: swelling in right UE Neuropsych:  Good spirits.  Not agitated or confused.   Intake/Output from previous day: 06/05 0701 - 06/06 0700 In: 1964.2 [P.O.:840; I.V.:874.2; IV Piggyback:250] Out: 2454 [Urine:2450; Stool:4]  Intake/Output this shift:    Lab Results:  Basename 10/27/11 0530 10/26/11 1758 10/26/11 0521  WBC 13.8* 9.2 15.1*  HGB 8.2* 8.3* 8.9*  HCT 23.5* 22.8* 25.0*  PLT 157 128* 126*   BMET  Basename 10/27/11 0530 10/25/11 0930  NA 133* 136  K 4.1 4.6  CL 105 109  CO2 20 18*  GLUCOSE 87 166*  BUN 12 28*  CREATININE 1.19* 1.41*  CALCIUM 7.7* 7.7*   Studies: 10/26/2011  Right UE dopplers The right upper extremity has evidence of deep and superficial vein thrombosis throughout the extremity, including the axillary, brachial, cephalic and basilic veins. The radial and ulnar veins are noncompressible, which may be due to swelling. The cephalic vein has evidence of thrombosis proximally, is compressible in its mid segment, and is compressible with visible thrombus in its distal segment. The study was technically limited due to right upper extremity swelling. The left internal jugular vein demonstrates increased, highly turbulent flow proximal to the subclavian  vein.    ASSESMENT: * Recurrent lower gi bleed from polypectomy site. 3rd colonoscopy with hemostatic therapy during this admit on 6/4. Poor operative candidate.  81 mg ASA d/c'd 6/4. Off Plavix for about 3 weeks.  * ABL anemia. Received 3 units blood 6/3 to 6/4, about 14 units total this admit. H & H holding steady.  *  PICC associated right UE DVTs.  Not a coumadin etc candidate given LGI bleed.  * Thrombocytopenia. Slight elevation in PT, normal INR  * Renal insufficiency.  * IDDM  * Endocarditis.     PLAN: *  Advance diet   LOS: 8 days   [name]  10/27/2011, 8:15 AM Pager: [phone]

## 2011-10-28 ENCOUNTER — Encounter (HOSPITAL_COMMUNITY): Payer: Self-pay | Admitting: Internal Medicine

## 2011-10-28 DIAGNOSIS — K922 Gastrointestinal hemorrhage, unspecified: Secondary | ICD-10-CM

## 2011-10-28 DIAGNOSIS — E871 Hypo-osmolality and hyponatremia: Secondary | ICD-10-CM

## 2011-10-28 DIAGNOSIS — R079 Chest pain, unspecified: Secondary | ICD-10-CM

## 2011-10-28 DIAGNOSIS — I749 Embolism and thrombosis of unspecified artery: Secondary | ICD-10-CM

## 2011-10-28 LAB — IRON AND TIBC
Iron: 29 ug/dL — ABNORMAL LOW (ref 42–135)
TIBC: 189 ug/dL — ABNORMAL LOW (ref 250–470)

## 2011-10-28 LAB — CBC
Platelets: 172 10*3/uL (ref 150–400)
RBC: 2.61 MIL/uL — ABNORMAL LOW (ref 3.87–5.11)
WBC: 11.3 10*3/uL — ABNORMAL HIGH (ref 4.0–10.5)

## 2011-10-28 LAB — TYPE AND SCREEN
ABO/RH(D): A POS
Antibody Screen: NEGATIVE
Unit division: 0
Unit division: 0

## 2011-10-28 LAB — GLUCOSE, CAPILLARY: Glucose-Capillary: 265 mg/dL — ABNORMAL HIGH (ref 70–99)

## 2011-10-28 LAB — BASIC METABOLIC PANEL
CO2: 21 mEq/L (ref 19–32)
Chloride: 106 mEq/L (ref 96–112)
Sodium: 135 mEq/L (ref 135–145)

## 2011-10-28 MED ORDER — PREDNISONE 10 MG PO TABS
10.0000 mg | ORAL_TABLET | Freq: Every day | ORAL | Status: DC
  Filled 2011-10-28: qty 1

## 2011-10-28 MED ORDER — ISOSORBIDE MONONITRATE ER 60 MG PO TB24
60.0000 mg | ORAL_TABLET | Freq: Every day | ORAL | Status: DC
  Filled 2011-10-28: qty 1

## 2011-10-28 MED ORDER — ESCITALOPRAM OXALATE 20 MG PO TABS
20.0000 mg | ORAL_TABLET | Freq: Every day | ORAL | Status: DC
  Administered 2011-10-28 – 2011-10-30 (×3): 20 mg via ORAL
  Filled 2011-10-28 (×3): qty 1

## 2011-10-28 MED ORDER — HYDRALAZINE HCL 50 MG PO TABS
50.0000 mg | ORAL_TABLET | Freq: Four times a day (QID) | ORAL | Status: DC
  Administered 2011-10-28 – 2011-10-30 (×6): 50 mg via ORAL
  Filled 2011-10-28 (×14): qty 1

## 2011-10-28 MED ORDER — INSULIN ASPART 100 UNIT/ML ~~LOC~~ SOLN
5.0000 [IU] | Freq: Three times a day (TID) | SUBCUTANEOUS | Status: DC
  Administered 2011-10-28 – 2011-10-30 (×6): 5 [IU] via SUBCUTANEOUS

## 2011-10-28 MED ORDER — PREDNISONE 10 MG PO TABS
10.0000 mg | ORAL_TABLET | Freq: Every day | ORAL | Status: DC
  Administered 2011-10-28 – 2011-10-30 (×3): 10 mg via ORAL
  Filled 2011-10-28 (×4): qty 1

## 2011-10-28 MED ORDER — ISOSORBIDE MONONITRATE ER 60 MG PO TB24
60.0000 mg | ORAL_TABLET | Freq: Every day | ORAL | Status: DC
  Administered 2011-10-28 – 2011-10-29 (×2): 60 mg via ORAL
  Filled 2011-10-28 (×5): qty 1

## 2011-10-28 NOTE — Progress Notes (Signed)
Patient seen, examined, and I agree with the above documentation, including the assessment and plan. No evidence of further bleeding. Her diet has been advanced and she is tolerating this well. Would continue to hold anticoagulation for now. She will followup with Dr. Arlyce Dice after discharge. If no further bleeding by tomorrow a.m., she has felt okay for discharge from a GI standpoint.

## 2011-10-28 NOTE — Progress Notes (Signed)
Pt stated she didn't want to wear the CPAP tonight. Said she would call RT if she changed her mind.RT will continue to monitor.

## 2011-10-28 NOTE — Progress Notes (Signed)
     [facility] Daily Rounding Note 10/28/2011, 8:24 AM  SUBJECTIVE:       Light yellow-brown stools.  Tolerating solid foods.  Wondering why she is on steroids, does not recall that she told me at her initial consult of 5/14 that a specialist at [facility] had restarted this due to recurring sxs of myositis.  Though she had come off statin in the fall and had had prior prolonged course of steroids.    Feels tired, did not sleep well.   OBJECTIVE:        General: looks better but still pale, fatigued and Cushingoid.     Vital signs in last 24 hours:    Temp:  [97.7 F (36.5 C)-98.8 F (37.1 C)] 98.2 F (36.8 C) (06/07 0819) Pulse Rate:  [60-69] 60  (06/06 1545) Resp:  [17-20] 20  (06/07 0819) BP: (144-158)/(28-44) 158/37 mmHg (06/07 0350) SpO2:  [93 %-98 %] 97 % (06/07 0819) Weight:  [181 lb 14.1 oz (82.5 kg)] 181 lb 14.1 oz (82.5 kg) (06/07 0500) Last BM Date: 10/27/11  Heart: RRR Chest: Clear.  No SOB, no cough. Abdomen: soft, obes, NT, active BS  Extremities: slight pedal edema,  Less tense but marked right UE edema Neuro/Psych:  Pleasant, in good spirits  Lab Results:  Basename 10/28/11 0530 10/27/11 0530 10/26/11 1758  WBC 11.3* 13.8* 9.2  HGB 7.9* 8.2* 8.3*  HCT 22.2* 23.5* 22.8*  PLT 172 157 128*   BMET  Basename 10/28/11 0530 10/27/11 0530 10/25/11 0930  NA 135 133* 136  K 4.4 4.1 4.6  CL 106 105 109  CO2 21 20 18*  GLUCOSE 118* 87 166*  BUN 15 12 28*  CREATININE 1.16* 1.19* 1.41*  CALCIUM 8.6 7.7* 7.7*    ASSESMENT: * Recurrent lower gi bleed from polypectomy site. 3rd colonoscopy with hemostatic therapy during this admit on 6/4. Poor operative candidate.  81 mg ASA d/c'd 6/4. Off Plavix for about 3 weeks.  * ABL anemia. Received 3 units blood 6/3 to 6/4, about 14 units total this admit. H & H essentially stable.  * PICC associated right UE DVTs. New PICC on left. Not a coumadin etc candidate given LGI bleed.  * Thrombocytopenia, resolved  * Renal  insufficiency, improved.  * IDDM  * Endocarditis. *  Statin- induced myositis.  Needs to fup with specialist at [facility] to determine if she can come off the Statin. Though pt has name of the MD, so a phone conversation may be all that is needed to begin approved steroid taper.   * OSA.  Most nights she has declined use of CPAP, if this is the case at home, will have long term negative health consequences.      PLAN: *  CBC in AM *  Could transfer out of ICU, to regular bed. *  ? Home in AM if stable and no rebleeding *  Dr Marzetta Board office will arrange follow up.   LOS: 9 days   [name]  10/28/2011, 8:24 AM Pager: [phone]

## 2011-10-28 NOTE — Progress Notes (Signed)
Talked to pt about CPAP,she stated that she didn't feel like she needed to wear it.

## 2011-10-29 DIAGNOSIS — R079 Chest pain, unspecified: Secondary | ICD-10-CM

## 2011-10-29 DIAGNOSIS — K922 Gastrointestinal hemorrhage, unspecified: Secondary | ICD-10-CM

## 2011-10-29 DIAGNOSIS — I749 Embolism and thrombosis of unspecified artery: Secondary | ICD-10-CM

## 2011-10-29 DIAGNOSIS — E871 Hypo-osmolality and hyponatremia: Secondary | ICD-10-CM

## 2011-10-29 LAB — BASIC METABOLIC PANEL
BUN: 16 mg/dL (ref 6–23)
Chloride: 102 mEq/L (ref 96–112)
Creatinine, Ser: 1.25 mg/dL — ABNORMAL HIGH (ref 0.50–1.10)
GFR calc Af Amer: 52 mL/min — ABNORMAL LOW (ref >90)

## 2011-10-29 LAB — CBC
HCT: 23.3 % — ABNORMAL LOW (ref 36.0–46.0)
MCV: 86.9 fL (ref 78.0–100.0)
RDW: 16.9 % — ABNORMAL HIGH (ref 11.5–15.5)
WBC: 12.9 10*3/uL — ABNORMAL HIGH (ref 4.0–10.5)

## 2011-10-29 MED ORDER — OXYCODONE-ACETAMINOPHEN 5-325 MG PO TABS
1.0000 | ORAL_TABLET | Freq: Three times a day (TID) | ORAL | Status: DC | PRN
  Administered 2011-10-29: 1 via ORAL
  Filled 2011-10-29: qty 1

## 2011-10-29 NOTE — Progress Notes (Signed)
[facility]  [place] TEAM 1 - Stepdown/ICU TEAM  Interim hx: 66 y.o. female with IDDM and multiple problems. 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 On schedule for 5/28 out pt screening colonoscopy- taked off Plavix for this. However, she was readmitted 5/19 with non-cardiac chest/epigastric pain and had Colon/EGD on 5/22. Several sessile colon polyps (adenomas) removed, polypectomy sites fulgurated, area of polyposis tattood for future reference. EGD was normal. Discharged home 5/23 on 81 mg ASA but not Plavix.  Readmitted 5/29 by the cardiology service for chest pain. Had near constant chest pain since discharge, but developed acute SOB, n/v, sweats day of her re-admit. New lateral ischemia on EKG, Troponin and MB elevated: Non-STEMI. Started on Heparin drip starting with bolus dose.  She began having painless hematochezia the same day.  Heparin was stopped. She underwent a cardiac cath per [facility] on 10/20/2011 revealing no evidence of significant coronary stenoses.  Her hemoglobin dropped from 10 on admission to 6.7 2 days later.  She had a syncopal episode during the colon prep when on the commode.  Colonoscopy done 5/31revealed postpolypectomy ulcers in right colon. Large lesion was injected with epi and clipped.  As bleeding continued, repeat colonoscopy done on 6/2 revealed separate ulcers in rt colon which were injected and clipped.  Rectal bleed recurred on 6/3 and she had her third colonoscopy and large ulcer with a vessel with adherent clot was injected and clipped.  She has not had further bleeding since  Subjective: Still very dizzy when she walks - have ambulated her in hall. Tolerating regular diet. Stools are formed and brown.  Objective: Blood pressure 126/70, pulse 63, temperature 97.9 F (36.6 C), temperature source Oral,  resp. rate 18, height 5\' 6"  (1.676 m), weight 82.5 kg (181 lb 14.1 oz), SpO2 93.00%.  Intake/Output from previous day: 06/07 0701 - 06/08 0700 In: 480 [P.O.:480] Out: -  Intake/Output this shift: Total I/O In: 360 [P.O.:360] Out: -  General appearance: alert, cooperative, appears stated age and no distress- pale Resp: clear to auscultation bilaterally- on room air Cardio: regular rate and rhythm, S1, S2 normal, no murmur, click, rub or gallop GI: soft, non-tender; bowel sounds normal; no masses,  no organomegaly Extremities: extremities normal, atraumatic, no cyanosis, unilateral edema of the right upper extremity extending from the hand to the axilla- PICC has been removed. LU arm PICC present Neurologic: Grossly normal  Lab Results:  Basename 10/29/11 0945 10/28/11 0530  WBC 12.9* 11.3*  HGB 7.9* 7.9*  HCT 23.3* 22.2*  PLT 246 172   BMET  Basename 10/29/11 0945 10/28/11 0530  NA 134* 135  K 4.4 4.4  CL 102 106  CO2 23 21  GLUCOSE 143* 118*  BUN 16 15  CREATININE 1.25* 1.16*  CALCIUM 8.9 8.6   Medications: I have reviewed the patient's current medications.  Assessment/Plan:  Chest pain, presenting complaint/ Normal coronary angiogram, 10/20/11  Nl LVF *Likely related to acute anemia *No significant coronary artery disease found on catheterization this admission  ASA stopped  Elevated troponin *Related to demand ischemia-see above  Acute posthemorrhagic anemia, transfused multiple units PRBC *Has received a total of 14 units of packed red blood cells this admission *currently not passing blood *last unit transfused on 6/4 Will need another unit today as she is symptomatic and hgb about 7.   GI bleed- due to Colon ulcer related  to recent polypectomy procedure *Baby aspirin discontinued 10/25/2011- -no longer bleeding *Primary gastroenterology physician to followup with after discharge will be Dr. Leone Payor  Superficial venous thrombosis from PICC line in left  arm *Noninvasive Doppler study today demonstrates deep as well as superficial vein thrombosis involving the axillary, brachial, basilic and cephalic veins. The ulnar and radial veins are noncompressible which may be due to swelling however deep venous thrombosis cannot be excluded. *Discontinued PICC line and replaced into the left upper extremity since the patient will require long-term antibiotic therapy for endocarditis *anticoag can not be safely used due to life threatening GI bleeding  Thrombocytopenia *Likely due to ongoing bleeding this admission Has resolved  HYPOTHYROIDISM *Continue levothyroxine  Obstructive sleep apnea on nocturnal CPAP *Refused CPAP overnight- feels she doesn't need it. - again, does not use O2 at home.   Pulmonary HTN-severe- due to diastolic dysfunction *Not yet resumed Apresoline,  Imdur and Lasix *Norvasc and Bystolic resumed 6/5- BP should be able to tolerate this today.   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  *Continue sliding scale insulin  Chronic renal insufficiency, stage III (moderate)/ RAS (renal artery stenosis), s/p renal artery PTA X 2 *Stable with adequate urinary output D/c IVF today  Syncope *Noncardiac etiology and likely related to hypotension related to anemia  HYPERLIPIDEMIA, hx of statin induced myosistis  HYPERTENSIVE CARDIOVASCULAR DISEASE *See above regarding usual home medications  COPD  *COPD compensated without wheezing or shortness of breath *She does not use O2 at home  Statin induced Myositis, chronic steroids *Resumed on home dosage prednisone 10 mg daily-this was resume recently but her [facility] doctor.   Disposition Needs new PCP- She no longer wants Dr Renard Matter as her PCP - she last saw him about 1-2 weeks prior  to the prior admission-  Transfuse, PT and OT eval  Calvert Cantor, MD [phone]   LOS: 10 days   10/29/2011, 2:57 PM

## 2011-10-29 NOTE — Progress Notes (Signed)
[facility] Progress Note  Subjective: Patient still feels weak, but no further bleeding. Her appetite is good and she's eating a full breakfast. She had a normal, formed brown stool this morning  Objective:  Vital signs in last 24 hours: Temp:  [98.1 F (36.7 C)-98.9 F (37.2 C)] 98.3 F (36.8 C) (06/08 0703) Pulse Rate:  [59-62] 59  (06/08 0703) Resp:  [18-20] 18  (06/08 0703) BP: (127-161)/(33-69) 150/65 mmHg (06/08 0703) SpO2:  [94 %-97 %] 94 % (06/08 0703) Last BM Date: 10/28/11 Gen: awake, alert, NAD, pale-appearing HEENT: anicteric, op clear CV: RRR, no mrg Pulm: CTA b/l Abd: soft, NT/ND, +BS throughout Ext: no c/c/e Neuro: nonfocal  Lab Results:  Basename 10/29/11 0945 10/28/11 0530 10/27/11 0530  WBC 12.9* 11.3* 13.8*  HGB 7.9* 7.9* 8.2*  HCT 23.3* 22.2* 23.5*  PLT 246 172 157   BMET  Basename 10/29/11 0945 10/28/11 0530 10/27/11 0530  NA 134* 135 133*  K 4.4 4.4 4.1  CL 102 106 105  CO2 23 21 20   GLUCOSE 143* 118* 87  BUN 16 15 12   CREATININE 1.25* 1.16* 1.19*  CALCIUM 8.9 8.6 7.7*    Assessment / Plan: Patient with multiple tubular adenomas and post-polypectomy bleed in the setting of anticoagulation. Also with history of endocarditis, and now right upper extremity DVT  1. Post-polypectomy bleeding -- patient underwent 3 colonoscopies for post-polypectomy bleeding, last of which was Tuesday of this week. (4 days ago). She's had no further bleeding since, hemoglobin is been stable. She remains anemic and this likely explains the weakness, but she's had no further evidence of ongoing bleeding or recurrent bleeding.  --From a GI standpoint, I feel that her post-polypectomy bleeding has resolved and is unlikely to recur at this point. She will follow with [place] in clinic. 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

## 2011-10-29 NOTE — Progress Notes (Signed)
IV team paged to draw CBC and BMET this am.   Minor, [name]

## 2011-10-29 NOTE — Discharge Summary (Addendum)
DISCHARGE SUMMARY  [name]  MR#: [id number]  DOB:[birth year]  Date of Admission: 10/19/2011 Date of Discharge: 10/30/2011  Attending Physician:Damyn Weitzel  Patient's WUJ:WJXBJYN,WGNFA G, MD, MD- no longer wants to see him  Consults:Treatment Team:  Hilarie Fredrickson, MD [facility] heart and vascular  Presenting Complaint: Chest pain  Discharge Diagnoses: Principal Problem:  *Chest pain with Elevated troponin a subsequent Normal coronary angiogram, 10/20/11  Hematochezia while on heparin  Post-polypectomy bleeding  Acute posthemorrhagic anemia- transfused a total of 15 units of blood DVT right arm due to PICC line  Streptococcal endocarditis, 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  Pulmonary HTN-severe- due to diastolic dysfunction 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 For anxiety      amLODipine 10 MG tablet   Commonly known as: NORVASC   Take 0.5 tablets (5 mg total) by mouth daily.      dextrose 5 % SOLN 50 mL with cefTRIAXone 2 G SOLR 2 g   Inject 2 g into the vein daily.      escitalopram 20 MG tablet   Commonly known as: LEXAPRO   Take 20 mg by mouth daily.      esomeprazole 40 MG capsule   Commonly known as: NEXIUM   Take 40 mg by mouth daily as needed. For reflux      furosemide 40 MG tablet   Commonly known as: LASIX   Take 1 tablet (40 mg  total) by mouth daily.      hydrALAZINE 50 MG tablet   Commonly known as: APRESOLINE   Take 1 tablet (50 mg total) by mouth every 6 (six) hours.      insulin glargine 100 UNIT/ML injection   Commonly known as: LANTUS   Inject 36 Units into the skin at bedtime.      isosorbide mononitrate 60 MG 24 hr tablet   Commonly known as: IMDUR   Take 60 mg by mouth daily.      levothyroxine 112 MCG tablet   Commonly known as: SYNTHROID, LEVOTHROID   Take 112 mcg by mouth daily.      nebivolol 2.5 MG tablet   Commonly known as: BYSTOLIC   Take 2.5 mg by mouth daily.      omega-3 acid ethyl esters 1 G capsule   Commonly known as: LOVAZA   Take 2 g by mouth 2 (two) times daily.      potassium chloride SA 20 MEQ tablet   Commonly known as: K-DUR,KLOR-CON   Take 1 tablet (20 mEq total) by mouth daily.      predniSONE 10 MG tablet   Commonly known as: DELTASONE   Take 1 tablet (10 mg total) by mouth daily with breakfast.      Vitamin D3 2000 UNITS Tabs  Take 1 tablet by mouth at bedtime.             Procedures: Dg Chest 2 View  10/19/2011  *RADIOLOGY REPORT*  Clinical Data: Shortness of breath and chest pain  CHEST - 2 VIEW  Comparison: 10/09/2011  Findings: There is a right arm PICC line with tip in the SVC.  The heart size and mediastinal contours are within normal limits.  Both lungs are clear.  The visualized skeletal structures are unremarkable.  IMPRESSION: Negative examination.  Original Report Authenticated By: Rosealee Albee, M.D.   Nm Myocar Multi W/spect W/wall Motion / Ef  09/30/2011  *RADIOLOGY REPORT*  Clinical Data:  Chest pain  MYOCARDIAL IMAGING WITH SPECT (REST AND PHARMACOLOGIC-STRESS) GATED LEFT VENTRICULAR WALL MOTION STUDY LEFT VENTRICULAR EJECTION FRACTION  Technique:  Standard myocardial SPECT imaging was performed after resting intravenous injection of 10 mCi Tc-70m tetrofosmin. Subsequently, intravenous infusion of Lexiscan was performed under the  supervision of the Cardiology staff.  At peak effect of the drug, 30 mCi Tc-72m tetrofosmin was injected intravenously and standard myocardial SPECT  imaging was performed.  Quantitative gated imaging was also performed to evaluate left ventricular wall motion, and estimate left ventricular ejection fraction.  Comparison:  None.  Findings:  Spect:  No perfusion defects.  Wall motion:  Normal motion.  Ejection fraction:  61%.  End diastolic volume 93 ml.  End-systolic volume 37 ml.  IMPRESSION: No perfusion defects.  Original Report Authenticated By: Donavan Burnet, M.D.   Dg Chest Port 1 View  10/09/2011  *RADIOLOGY REPORT*  Clinical Data: Chest pain.  History of pericarditis  PORTABLE CHEST - 1 VIEW  Comparison: Chest radiograph 10/02/2011  Findings: Right PICC line with tip in distal SVC.  Normal cardiac silhouette.  No effusion, infiltrate, or pneumothorax.  IMPRESSION: No acute cardiopulmonary findings.  Original Report Authenticated By: Genevive Bi, M.D.   Dg Chest Port 1 View  10/02/2011  *RADIOLOGY REPORT*  Clinical Data: Dyspnea and weakness.  PORTABLE CHEST - 1 VIEW  Comparison: 09/28/2011  Findings: Mild cardiac enlargement with normal pulmonary vascularity.  Emphysematous changes with interstitial fibrosis suggested.  No blunting of costophrenic angles.  No focal airspace consolidation.  No pneumothorax.  IMPRESSION: Stable cardiac enlargement.  No evidence of vascular congestion or edema.  No focal airspace disease.  Original Report Authenticated By: Marlon Pel, M.D.    Right arm venous duplex 6/5 Summary: The right upper extremity has evidence of deep and superficial vein thrombosis throughout the extremity, including the axillary, brachial, cephalic and basilic veins. The radial and ulnar veins are noncompressible, which may be due to swelling. The cephalic vein has evidence of thrombosis proximally, is compressible in its mid segment, and is compressible with visible  thrombus in its distal segment. The study was technically limited due to right upper extremity swelling. The left internal jugular vein demonstrates increased, highly turbulent flow proximal to the subclavian vein.    Hospital Course: 66 y.o. female with IDDM and multiple problems. 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 On schedule for 5/28 out pt screening colonoscopy- taked off Plavix for this. However, she was readmitted 5/19 with non-cardiac chest/epigastric pain and had Colon/EGD on 5/22. Several sessile colon polyps (adenomas) removed, polypectomy sites fulgurated, area of polyposis tattood for future reference. EGD was normal. Discharged home 5/23 on 81 mg ASA but not Plavix.  Readmitted  5/29 by the cardiology service for chest pain. Had near constant chest pain since discharge, but developed acute SOB, n/v, sweats day of her re-admit. New lateral ischemia on EKG, Troponin and MB elevated: Non-STEMI. Started on Heparin drip starting with bolus dose. She began having painless hematochezia the same day. Heparin was stopped. She underwent a cardiac cath per [facility] on 10/20/2011 revealing no evidence of significant coronary stenoses.  Her hemoglobin dropped from 10 on admission to 6.7 two days later.  She had a syncopal episode during the colon prep when on the commode.  Colonoscopy done 5/31revealed postpolypectomy ulcers in right colon. Large lesion was injected with epi and clipped.  As bleeding continued, repeat colonoscopy done on 6/2 revealed separate ulcers in rt colon which were injected and clipped.  Rectal bleed recurred on 6/3 and she had her third colonoscopy and large ulcer with a vessel with adherent clot was injected and clipped.  She has not had further bleeding since  Chest pain, presenting complaint/ Normal coronary angiogram,  10/20/11 Nl LVF  *Likely related to acute anemia  *No significant coronary artery disease found on catheterization this admission  ASA stopped   Elevated troponin  *Related to demand ischemia-see above   Acute posthemorrhagic anemia, transfused multiple units PRBC  *Has received a total of 15 units of packed red blood cells this admission  *currently not passing blood any longer Hgb on d/c today is 9.5.   GI bleed- due to Colon ulcer related to recent polypectomy procedure  *Baby aspirin discontinued 10/25/2011- -no longer bleeding  *Primary gastroenterology physician to followup with after discharge will be Dr. Leone Payor - she has been told by Dr Margretta Sidle that she will be called at home for and appt.   Superficial venous thrombosis from PICC line in left arm  *Noninvasive Doppler study demonstrates deep as well as superficial vein thrombosis involving the axillary, brachial, basilic and cephalic veins. The ulnar and radial veins are noncompressible which may be due to swelling however deep venous thrombosis cannot be excluded.  *Discontinued PICC line and replaced into the left upper extremity since the patient will require long-term antibiotic therapy for endocarditis  I have spoken with Dr Imogene Burn who assures me that anticoagulation is not needed unless thrombus involved the Subclavian vein.   Thrombocytopenia  *Likely due to ongoing bleeding this admission  Has resolved   HYPOTHYROIDISM  *Continue levothyroxine   Obstructive sleep apnea on nocturnal CPAP  *Refused CPAP overnight- feels she doesn't need it. - again, does not use O2 at home.   Pulmonary HTN-severe- due to diastolic dysfunction  Have slowly reintroduced BP meds and will be resuming Lasix on d/c today.    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    Chronic renal insufficiency, stage III (moderate)/ RAS (renal artery stenosis), s/p renal artery PTA X 2  *Stable with adequate urinary output   Syncope  *Noncardiac etiology and likely related to hypotension related to anemia during colon prep  HYPERLIPIDEMIA, hx of statin induced myosistis   HYPERTENSIVE CARDIOVASCULAR DISEASE  *See above regarding usual home medications   COPD  *COPD compensated without wheezing or shortness of breath  *She does not use O2 at home   Statin induced Myositis, chronic steroids  Was on stress dose steroids- tapered back down  to prednisone 10 mg daily-   Day of Discharge Physical Exam: BP 137/67  Pulse 64  Temp(Src) 99 F (37.2 C) (Oral)  Resp 20  Ht 5\' 6"  (1.676 m)  Wt 82.5 kg (181 lb 14.1 oz)  BMI 29.36 kg/m2  SpO2 94% General appearance: alert, cooperative, appears stated age and no distress- pale  Resp: clear to auscultation bilaterally- on room air  Cardio: regular rate and rhythm, S1, S2 normal, no murmur, click, rub or gallop  GI: soft, non-tender; bowel sounds normal; no masses, no organomegaly  Extremities: extremities normal, atraumatic, no cyanosis, unilateral edema of the right upper extremity extending from the hand to the axilla- PICC has been removed. LU arm PICC present  Neurologic: Grossly normal   Results for orders placed during the hospital encounter of 10/19/11 (from the past 24 hour(s))  GLUCOSE, CAPILLARY     Status: Abnormal   Collection Time   10/29/11  4:45 PM      Component Value Range   Glucose-Capillary 237 (*) 70 - 99 (mg/dL)  PREPARE RBC (CROSSMATCH)     Status: Normal   Collection Time   10/29/11  6:00 PM      Component Value Range   Order Confirmation ORDER PROCESSED BY BLOOD BANK    TYPE AND SCREEN     Status: Normal   Collection Time   10/29/11  6:00 PM      Component Value Range   ABO/RH(D) A POS     Antibody Screen NEG     Sample Expiration 11/01/2011     Unit Number [id number]     Blood  Component Type RED CELLS,LR     Unit division 00     Status of Unit ISSUED,FINAL     Transfusion Status OK TO TRANSFUSE     Crossmatch Result Compatible    GLUCOSE, CAPILLARY     Status: Abnormal   Collection Time   10/30/11  5:56 AM      Component Value Range   Glucose-Capillary 166 (*) 70 - 99 (mg/dL)  CBC     Status: Abnormal   Collection Time   10/30/11 11:30 AM      Component Value Range   WBC 13.5 (*) 4.0 - 10.5 (K/uL)   RBC 3.23 (*) 3.87 - 5.11 (MIL/uL)   Hemoglobin 9.5 (*) 12.0 - 15.0 (g/dL)   HCT 30.8 (*) 65.7 - 46.0 (%)   MCV 86.1  78.0 - 100.0 (fL)   MCH 29.4  26.0 - 34.0 (pg)   MCHC 34.2  30.0 - 36.0 (g/dL)   RDW 84.6 (*) 96.2 - 15.5 (%)   Platelets 278  150 - 400 (K/uL)  BASIC METABOLIC PANEL     Status: Abnormal   Collection Time   10/30/11 11:30 AM      Component Value Range   Sodium 138  135 - 145 (mEq/L)   Potassium 4.5  3.5 - 5.1 (mEq/L)   Chloride 105  96 - 112 (mEq/L)   CO2 22  19 - 32 (mEq/L)   Glucose, Bld 105 (*) 70 - 99 (mg/dL)   BUN 19  6 - 23 (mg/dL)   Creatinine, Ser 9.52 (*) 0.50 - 1.10 (mg/dL)   Calcium 8.9  8.4 - 84.1 (mg/dL)   GFR calc non Af Amer 41 (*) >90 (mL/min)   GFR calc Af Amer 48 (*) >90 (mL/min)  GLUCOSE, CAPILLARY     Status: Abnormal   Collection Time   10/30/11 11:43 AM  Component Value Range   Glucose-Capillary 113 (*) 70 - 99 (mg/dL)  GLUCOSE, CAPILLARY     Status: Abnormal   Collection Time   10/30/11  2:39 PM      Component Value Range   Glucose-Capillary 266 (*) 70 - 99 (mg/dL)    Disposition: stable    Follow-up Appts: Discharge Orders    Future Appointments: Provider: Department: Dept Phone: Center:   11/01/2011 9:00 AM Gardiner Barefoot, MD [facility] [id number] [facility]     Future Orders Please Complete By Expires   Home Health      Questions: Responses:   To provide the following care/treatments PT    RN   Face-to-face encounter      Comments:   I Roshawn Lacina certify that this patient is under my care  and that I, or a nurse practitioner or physician's assistant working with me, had a face-to-face encounter that meets the physician face-to-face encounter requirements with this patient on 10/30/2011.       Questions: Responses:   The encounter with the patient was in whole, or in part, for the following medical condition, which is the primary reason for home health care chest pain and gi bleed   I certify that, based on my findings, the following services are medically necessary home health services Nursing    Physical therapy   My clinical findings support the need for the above services Infection requiring IV antibiotics   Further, I certify that my clinical findings support that this patient is homebound (i.e. absences from home require considerable and taxing effort and are for medical reasons or religious services or infrequently or of short duration when for other reasons) Unable to leave home safely without assistance   To provide the following care/treatments PT    RN      Follow-up with  GI- they will call you.  Patient wants to find her own PCP- I have told her if she does find and appt soon, she may need to go back to her PCP.   Time on Discharge: 1 hour  Signed: Thyra Yinger 10/30/2011, 4:34 PM

## 2011-10-30 DIAGNOSIS — I749 Embolism and thrombosis of unspecified artery: Secondary | ICD-10-CM

## 2011-10-30 DIAGNOSIS — K922 Gastrointestinal hemorrhage, unspecified: Secondary | ICD-10-CM

## 2011-10-30 DIAGNOSIS — E871 Hypo-osmolality and hyponatremia: Secondary | ICD-10-CM

## 2011-10-30 DIAGNOSIS — R079 Chest pain, unspecified: Secondary | ICD-10-CM

## 2011-10-30 LAB — CBC
MCH: 29.4 pg (ref 26.0–34.0)
MCV: 86.1 fL (ref 78.0–100.0)
Platelets: 278 10*3/uL (ref 150–400)
RDW: 17.2 % — ABNORMAL HIGH (ref 11.5–15.5)
WBC: 13.5 10*3/uL — ABNORMAL HIGH (ref 4.0–10.5)

## 2011-10-30 LAB — GLUCOSE, CAPILLARY: Glucose-Capillary: 113 mg/dL — ABNORMAL HIGH (ref 70–99)

## 2011-10-30 LAB — BASIC METABOLIC PANEL
Calcium: 8.9 mg/dL (ref 8.4–10.5)
Chloride: 105 mEq/L (ref 96–112)
Creatinine, Ser: 1.34 mg/dL — ABNORMAL HIGH (ref 0.50–1.10)
GFR calc Af Amer: 48 mL/min — ABNORMAL LOW (ref >90)
GFR calc non Af Amer: 41 mL/min — ABNORMAL LOW (ref >90)

## 2011-10-30 LAB — TYPE AND SCREEN: ABO/RH(D): A POS

## 2011-10-30 MED ORDER — INSULIN GLARGINE 100 UNIT/ML ~~LOC~~ SOLN: 36.0000 [IU] | Freq: Every day | SUBCUTANEOUS | Status: DC

## 2011-10-30 MED ORDER — AMLODIPINE BESYLATE 10 MG PO TABS: 5.0000 mg | ORAL_TABLET | Freq: Every day | ORAL | Status: DC

## 2011-10-30 MED ORDER — PREDNISONE 10 MG PO TABS: 10.0000 mg | ORAL_TABLET | Freq: Every day | ORAL | Status: DC

## 2011-10-30 NOTE — Progress Notes (Signed)
PT has been refusing CPAP at night. RT will continue to monitor. 

## 2011-10-30 NOTE — Progress Notes (Signed)
Pt refusing to wear CPAP again tonight.

## 2011-10-30 NOTE — Progress Notes (Signed)
IV team paged to draw this morning's CBC and BMET.  [name], [name]

## 2011-10-30 NOTE — Discharge Instructions (Signed)
Patient may contact [employer] toll free number on your card for provider booklet. Patient may also contact [facility] [phone] for information on primary care physicians that accept your insurance.

## 2011-10-30 NOTE — Evaluation (Signed)
Physical Therapy Evaluation Patient Details Name: [name] MRN: [id number] DOB: Nov 03, 1945 Today's Date: 10/30/2011 Time: 4098-1191 PT Time Calculation (min): 32 min  PT Assessment / Plan / Recommendation Clinical Impression  66 year old with multiple medical complications with diagnosis on 09/2011 with Strep gallolyticus endocarditis, multiple bleeding ulcers of her colon requiring multiple interventions, she has also had new lateral ischemia on EKG, Troponin and MB elevated: Non-STEMI noted, and Rt. UE clot. Pt presents to physical therapy with generalized weakness, deconditioning, and imbalance with gait. PT recommends supervision with ambulation. Pt will not have 24 hour supervision however pt reports she will avoid walking as much as possible unless assisted by husband or daughter. Stressed falls risk and consequences of falling to pt. PT recommending bedside commode/shower chair for pt however she pleasantly declines as she does not want to feel debilitated. Discussed assistive devices which she also declines. Wil benefit greatly from HHPT at D/C.    PT Assessment  Patient needs continued PT services    Follow Up Recommendations  Home health PT;Supervision for mobility/OOB    Barriers to Discharge Decreased caregiver support      lEquipment Recommendations  Other (comment) (pt declines equipment recommended)       Frequency Min 3X/week (+)    Precautions / Restrictions Precautions Precautions: Fall Restrictions Weight Bearing Restrictions: No         Mobility  Bed Mobility Bed Mobility: Rolling Right;Right Sidelying to Sit Rolling Right: 5: Supervision Right Sidelying to Sit: 5: Supervision Transfers Transfers: Sit to Stand;Stand to Sit Sit to Stand: 4: Min guard;5: Supervision;From bed;From toilet Stand to Sit: 5: Supervision;To bed;To toilet;To chair/3-in-1 Details for Transfer Assistance: Initial sit <> stand min-guard assist needed secondary to instability, pt  improved to supervision with repetition. Ambulation/Gait Ambulation/Gait Assistance: 4: Min assist;5: Supervision Ambulation Distance (Feet): 230 Feet Assistive device: None Ambulation/Gait Assistance Details: Verbal cues for safe controlled speed. Pt with widdened base of support, occasional lateral losses of balance however pt able to self correct for the most part. Husband educated on positioning self for safety and to provide optimal support.  Gait Pattern: Step-through pattern;Wide base of support;Decreased stride length Stairs: Yes Stairs Assistance: 4: Min assist Stairs Assistance Details (indicate cue type and reason): one hand hold assist, husband educated on positioning of self to promote safety for himself and pt. advised to assist with Lt. UE secondary to Rt. UE clot. Pt cued on safe sequencing and only performing steps with assist.  Stair Management Technique: Forwards Number of Stairs: 4         PT Diagnosis: Difficulty walking;Abnormality of gait;Generalized weakness  PT Problem List: Decreased strength;Decreased activity tolerance;Decreased balance;Decreased mobility;Decreased knowledge of use of DME PT Treatment Interventions: DME instruction;Gait training;Stair training;Functional mobility training;Therapeutic activities;Therapeutic exercise;Balance training;Neuromuscular re-education;Patient/family education   PT Goals Acute Rehab PT Goals PT Goal Formulation: With patient Time For Goal Achievement: 11/13/11 Potential to Achieve Goals: Good Pt will Roll Supine to Right Side: with modified independence PT Goal: Rolling Supine to Right Side - Progress: Goal set today Pt will Roll Supine to Left Side: with modified independence PT Goal: Rolling Supine to Left Side - Progress: Goal set today Pt will go Supine/Side to Sit: with modified independence PT Goal: Supine/Side to Sit - Progress: Goal set today Pt will go Sit to Stand: with modified independence PT Goal: Sit to  Stand - Progress: Goal set today Pt will go Stand to Sit: with modified independence PT Goal: Stand to Sit -  Progress: Goal set today Pt will Ambulate: >150 feet;with modified independence PT Goal: Ambulate - Progress: Goal set today Pt will Go Up / Down Stairs: 1-2 stairs;with modified independence;with least restrictive assistive device PT Goal: Up/Down Stairs - Progress: Goal set today  Visit Information  Last PT Received On: 10/30/11 Assistance Needed: +1    Subjective Data  Subjective: I can't have anything else go wrong. Patient Stated Goal: Get back home   Prior Functioning  Home Living Lives With: Spouse Available Help at Discharge: Family (daughter able to check on pt when husband gone.) Type of Home: House Home Access: Stairs to enter Secretary/administrator of Steps: 2 Entrance Stairs-Rails: None Home Layout: One level Bathroom Shower/Tub: Forensic scientist: Standard Bathroom Accessibility: Yes How Accessible: Accessible via walker Home Adaptive Equipment: None Prior Function Level of Independence: Independent Able to Take Stairs?: Yes Driving: Yes Vocation: Retired Comments: Loves to watch TV - [employer] and Order is favorite show.  Communication Communication: No difficulties Dominant Hand: Right    Cognition  Overall Cognitive Status: Appears within functional limits for tasks assessed/performed Arousal/Alertness: Awake/alert Orientation Level: Appears intact for tasks assessed Behavior During Session: [facility] for tasks performed    Extremity/Trunk Assessment Right Lower Extremity Assessment RLE ROM/Strength/Tone: Deficits RLE ROM/Strength/Tone Deficits: Generalized deconditioning, grossly >/= 3+/5 RLE Sensation: WFL - Light Touch Left Lower Extremity Assessment LLE ROM/Strength/Tone: Deficits LLE ROM/Strength/Tone Deficits: Generalized deconditioning, grossly >/= 3+/5 LLE Sensation: WFL - Light Touch   Balance Balance Balance Assessed:  Yes Static Standing Balance Static Standing - Balance Support: No upper extremity supported Static Standing - Level of Assistance: 5: Stand by assistance Static Standing - Comment/# of Minutes: Pt recruits wide base of support to compensate for decreased balance.  End of Session PT - End of Session Equipment Utilized During Treatment: Gait belt Activity Tolerance: Patient tolerated treatment well Patient left: in chair;with call bell/phone within reach;with family/visitor present Nurse Communication: Mobility status   Wilhemina Bonito 10/30/2011, 5:56 PM  Sherie Don) Carleene Mains PT, DPT Acute Rehabilitation [phone]

## 2011-10-30 NOTE — Progress Notes (Addendum)
   CARE MANAGEMENT NOTE 10/30/2011  Patient:  [name], [name]   Account Number:  [id number]  Date Initiated:  10/20/2011  Documentation initiated by:  Junius Creamer  Subjective/Objective Assessment:   adm w pos trop, ch pain     Action/Plan:   lives w husband,pcp dr Thalia Party [facility], act w [facility]   Anticipated DC Date:  10/30/2011   Anticipated DC Plan:  HOME W HOME HEALTH SERVICES      DC Planning Services  CM consult      [facility] Choice  Resumption Of Svcs/PTA Provider   Choice offered to / List presented to:  C-1 Patient        HH arranged  HH-1 RN  IV Antibiotics      HH agency  [facility]   Status of service:  Completed, signed off Medicare Important Message given?   (If response is "NO", the following Medicare IM given date fields will be blank) Date Medicare IM given:   Date Additional Medicare IM given:    Discharge Disposition:  HOME W HOME HEALTH SERVICES  Per UR Regulation:  Reviewed for med. necessity/level of care/duration of stay  If discussed at Long Length of Stay Meetings, dates discussed:   10/26/2011    Comments:  10/30/2011 1630 Contacted [facility] to make aware pt is scheduled for d/c today with home IV abx. Unit RN will administer today's dose. And HH will follow up with IV meds at home and [facility] RN to give meds on tomorrow. Provided pt with number for [facility] to find another primary care physician. Also spoke to pt and made her aware she can contact [employer] toll free number on her card and request a provider booklet. Information on pt's d/c instructions.  Isidoro Donning RN CCM Case Mgmt phone [phone]  6/3 debbie dowell rn,bsn [facility] over weekend. adj apresoline.  5/30 8:37a debbie dowell rn,bsn [phone]

## 2011-11-01 ENCOUNTER — Inpatient Hospital Stay: Payer: BC Managed Care – PPO | Admitting: Internal Medicine

## 2011-11-01 ENCOUNTER — Telehealth: Payer: Self-pay | Admitting: *Deleted

## 2011-11-01 NOTE — Telephone Encounter (Signed)
States she did not know she had an appt here today. Transferred to front desk to make another appt. She has someone who will drive her

## 2011-11-03 ENCOUNTER — Telehealth: Payer: Self-pay | Admitting: *Deleted

## 2011-11-03 NOTE — Telephone Encounter (Signed)
I spoke with the md who will handle later this am

## 2011-11-03 NOTE — Telephone Encounter (Signed)
I told him I had just spoken with the [facility] nurse & she is coming out tomorrow to pull the PICC. I verified the next appt date & time

## 2011-11-03 NOTE — Telephone Encounter (Signed)
[name] with [facility] called back & confirmed that she got the message & will pull the PICC tomorrow

## 2011-11-03 NOTE — Telephone Encounter (Signed)
I called Lorene Dy & left a message that it ok to pull the PICC if pt is well. I asked her to call me to confirm she got this message

## 2011-11-03 NOTE — Telephone Encounter (Signed)
Ok to pull picc if she is feeling ok.  Follow up PRN

## 2011-11-03 NOTE — Telephone Encounter (Signed)
Danford Bad with [facility] called [phone]) if she should pull the PICC today after last dose. I told her I will check this & call her back

## 2011-11-07 ENCOUNTER — Telehealth: Payer: Self-pay | Admitting: *Deleted

## 2011-11-07 ENCOUNTER — Ambulatory Visit (INDEPENDENT_AMBULATORY_CARE_PROVIDER_SITE_OTHER): Payer: BC Managed Care – PPO | Admitting: Internal Medicine

## 2011-11-07 VITALS — BP 162/62 | HR 72 | Temp 97.8°F | Ht 65.0 in | Wt 176.2 lb

## 2011-11-07 DIAGNOSIS — A491 Streptococcal infection, unspecified site: Secondary | ICD-10-CM

## 2011-11-07 DIAGNOSIS — I330 Acute and subacute infective endocarditis: Secondary | ICD-10-CM

## 2011-11-07 NOTE — Progress Notes (Signed)
Patient ID: [name], female   DOB: [birth year], 66 y.o.   MRN: [id number]    [facility]  Patient Active Problem List  Diagnosis  . HYPOTHYROIDISM  . HYPERLIPIDEMIA, hx of statin induced myosistis  . OTHER SPECIFIED IDIOPATHIC PERIPHERAL NEUROPATHY  . HYPERTENSIVE CARDIOVASCULAR DISEASE  . COPD UNSPECIFIED  . VERTIGO  . Obstructive sleep apnea on nocturnal CPAP  . Leukocytosis  . Acute renal failure  . Elevated troponin  . Pulmonary HTN-severe- due to diastolic dysfunction  . 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  . Nausea with vomiting  . Diarrhea  . Volume depletion, gastrointestinal loss  . Normocytic anemia  . Hyperglycemia  . 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 Recurrent otitis media  . Chest pain  . Diabetes mellitus  . RAS (renal artery stenosis), s/p renal artery PTA X 2  . Esophageal reflux  . Special screening for malignant neoplasms, colon  . Normal coronary angiogram, 10/20/11  Nl LVF  . Benign neoplasm of colon  . Hemorrhage of rectum and anus, polypectomy 10/12/11  . Acute posthemorrhagic anemia  . Chest pain  . 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 Chronic renal insufficiency, stage III (moderate)  . Colon ulcer related to recent polypectomy procedure  . Syncope  . Post-polypectomy bleeding    Patient's Medications  New Prescriptions   No medications on file  Previous Medications   ALPRAZOLAM (XANAX) 0.5 MG TABLET    Take 0.5 mg by mouth at bedtime as needed. For anxiety   CHOLECALCIFEROL (VITAMIN D3) 2000 UNITS TABS    Take 1 tablet by mouth at bedtime.     ESCITALOPRAM (LEXAPRO) 20 MG TABLET    Take 20 mg by mouth daily.     ESOMEPRAZOLE (NEXIUM) 40 MG CAPSULE    Take 40 mg by mouth daily as needed. For reflux   FUROSEMIDE (LASIX) 40 MG TABLET    Take 1 tablet (40 mg total) by mouth daily.   INSULIN GLARGINE (LANTUS) 100 UNIT/ML INJECTION    Inject 36 Units into the skin  at bedtime.   ISOSORBIDE MONONITRATE (IMDUR) 60 MG 24 HR TABLET    Take 60 mg by mouth daily.     LEVOTHYROXINE (SYNTHROID, LEVOTHROID) 112 MCG TABLET    Take 112 mcg by mouth daily.     NEBIVOLOL (BYSTOLIC) 2.5 MG TABLET    Take 2.5 mg by mouth daily.     OMEGA-3 FATTY ACIDS (FISH OIL) 1000 MG CAPS    Take 4,000 mg by mouth daily.   POTASSIUM CHLORIDE SA (K-DUR,KLOR-CON) 20 MEQ TABLET    Take 1 tablet (20 mEq total) by mouth daily.  Modified Medications   Modified Medication Previous Medication   HYDRALAZINE (APRESOLINE) 50 MG TABLET hydrALAZINE (APRESOLINE) 50 MG tablet      Take 50 mg by mouth 3 (three) times daily.    Take 1 tablet (50 mg total) by mouth every 6 (six) hours.  Discontinued Medications   AMLODIPINE (NORVASC) 10 MG TABLET    Take 0.5 tablets (5 mg total) by mouth daily.   OMEGA-3 ACID ETHYL ESTERS (LOVAZA) 1 G CAPSULE    Take 2 g by mouth 2 (two) times daily.   PREDNISONE (DELTASONE) 10 MG TABLET    Take 1 tablet (10 mg total) by mouth daily with breakfast.    Subjective: Mrs. Kluender is in for her hospital followup visit. 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 She was hospitalized initially from May 8-28. She was readmitted one day later because of recurrent GI bleeding. She has had repeat colonoscopy which showed a bleeding site that was injected but no evidence of colonic malignancy. She is starting to feel much better. She states that her memory is coming back. She has not had any fever, chills or sweats.  Objective: Temp: 97.8 F (36.6 C) (06/17 1339) Temp src: Oral (06/17 1339) BP: 162/62 mmHg (06/17 1339) Pulse Rate: 72  (06/17 1339)  General: She is in good spirits Skin: Her previous bilateral upper arm PICC sites look good Lungs: Clear Cor: Regular S1 and S2 no murmurs  Assessment: I am hopeful that her endocarditis has now been cured but we will  need to observe closely off of antibiotics. I asked her to call me if she has any fever, chills or sweats or other signs of possible relapse between now and her next visit.  Plan: 1. Observe off of antibiotics 2. Followup in 6 weeks   Cliffton Asters, MD [facility] for Infectious Disease [facility] [phone] pager   [phone] cell 11/07/2011, 2:00 PM

## 2011-11-07 NOTE — Telephone Encounter (Signed)
Appointment reminder

## 2011-11-22 ENCOUNTER — Inpatient Hospital Stay: Payer: BC Managed Care – PPO | Admitting: Internal Medicine

## 2011-12-12 ENCOUNTER — Telehealth: Payer: Self-pay

## 2011-12-12 NOTE — Telephone Encounter (Signed)
Hx of blood clot in right arm. Pt is now having "bee sting feelings" in finger and numbness with  pain present . Pain and symptoms are  the same as in the hospital . It has not worsened but she is concerned it has not improved. Pt wonders if she should be seen this week instead of next week.  OV with Dr Orvan Falconer on 12-20-11 but has concerns about waiting.

## 2011-12-12 NOTE — Telephone Encounter (Signed)
Patient advised. I spoke with Dr Drue Second who states there is no need for concern at this point since there has not been any changes. If pain and numbness worsens call the office for advice.  Keep scheduled appointment with Dr Orvan Falconer for next week.   [name] , RN

## 2011-12-15 ENCOUNTER — Inpatient Hospital Stay (HOSPITAL_COMMUNITY)
Admission: EM | Admit: 2011-12-15 | Discharge: 2011-12-18 | DRG: 014 | Disposition: A | Discharge status: Home / Self Care | Payer: BC Managed Care – PPO | Attending: Family Medicine | Admitting: Family Medicine

## 2011-12-15 ENCOUNTER — Encounter (HOSPITAL_COMMUNITY): Payer: Self-pay | Admitting: Emergency Medicine

## 2011-12-15 DIAGNOSIS — I6350 Cerebral infarction due to unspecified occlusion or stenosis of unspecified cerebral artery: Principal | ICD-10-CM | POA: Diagnosis present

## 2011-12-15 DIAGNOSIS — R5381 Other malaise: Secondary | ICD-10-CM | POA: Diagnosis present

## 2011-12-15 DIAGNOSIS — G4733 Obstructive sleep apnea (adult) (pediatric): Secondary | ICD-10-CM | POA: Diagnosis present

## 2011-12-15 DIAGNOSIS — E039 Hypothyroidism, unspecified: Secondary | ICD-10-CM | POA: Diagnosis present

## 2011-12-15 DIAGNOSIS — Z794 Long term (current) use of insulin: Secondary | ICD-10-CM

## 2011-12-15 DIAGNOSIS — Z880 Allergy status to penicillin: Secondary | ICD-10-CM

## 2011-12-15 DIAGNOSIS — R5383 Other fatigue: Secondary | ICD-10-CM | POA: Diagnosis present

## 2011-12-15 DIAGNOSIS — Z888 Allergy status to other drugs, medicaments and biological substances status: Secondary | ICD-10-CM

## 2011-12-15 DIAGNOSIS — I130 Hypertensive heart and chronic kidney disease with heart failure and stage 1 through stage 4 chronic kidney disease, or unspecified chronic kidney disease: Secondary | ICD-10-CM | POA: Diagnosis present

## 2011-12-15 DIAGNOSIS — IMO0001 Reserved for inherently not codable concepts without codable children: Secondary | ICD-10-CM | POA: Diagnosis present

## 2011-12-15 DIAGNOSIS — Z87891 Personal history of nicotine dependence: Secondary | ICD-10-CM

## 2011-12-15 DIAGNOSIS — E785 Hyperlipidemia, unspecified: Secondary | ICD-10-CM | POA: Diagnosis present

## 2011-12-15 DIAGNOSIS — I272 Pulmonary hypertension, unspecified: Secondary | ICD-10-CM | POA: Diagnosis present

## 2011-12-15 DIAGNOSIS — I701 Atherosclerosis of renal artery: Secondary | ICD-10-CM | POA: Diagnosis present

## 2011-12-15 DIAGNOSIS — Z886 Allergy status to analgesic agent status: Secondary | ICD-10-CM

## 2011-12-15 DIAGNOSIS — I639 Cerebral infarction, unspecified: Secondary | ICD-10-CM

## 2011-12-15 DIAGNOSIS — I252 Old myocardial infarction: Secondary | ICD-10-CM

## 2011-12-15 DIAGNOSIS — F329 Major depressive disorder, single episode, unspecified: Secondary | ICD-10-CM | POA: Diagnosis present

## 2011-12-15 DIAGNOSIS — E119 Type 2 diabetes mellitus without complications: Secondary | ICD-10-CM | POA: Diagnosis present

## 2011-12-15 DIAGNOSIS — Z8249 Family history of ischemic heart disease and other diseases of the circulatory system: Secondary | ICD-10-CM

## 2011-12-15 DIAGNOSIS — Z79899 Other long term (current) drug therapy: Secondary | ICD-10-CM

## 2011-12-15 DIAGNOSIS — F3289 Other specified depressive episodes: Secondary | ICD-10-CM | POA: Diagnosis present

## 2011-12-15 DIAGNOSIS — Z86718 Personal history of other venous thrombosis and embolism: Secondary | ICD-10-CM

## 2011-12-15 DIAGNOSIS — N183 Chronic kidney disease, stage 3 unspecified: Secondary | ICD-10-CM | POA: Diagnosis present

## 2011-12-15 DIAGNOSIS — I519 Heart disease, unspecified: Secondary | ICD-10-CM | POA: Diagnosis present

## 2011-12-15 DIAGNOSIS — B372 Candidiasis of skin and nail: Secondary | ICD-10-CM | POA: Diagnosis present

## 2011-12-15 DIAGNOSIS — Z881 Allergy status to other antibiotic agents status: Secondary | ICD-10-CM

## 2011-12-15 DIAGNOSIS — N2889 Other specified disorders of kidney and ureter: Secondary | ICD-10-CM | POA: Diagnosis present

## 2011-12-15 DIAGNOSIS — E1129 Type 2 diabetes mellitus with other diabetic kidney complication: Secondary | ICD-10-CM | POA: Diagnosis present

## 2011-12-15 DIAGNOSIS — R42 Dizziness and giddiness: Secondary | ICD-10-CM

## 2011-12-15 DIAGNOSIS — T466X5A Adverse effect of antihyperlipidemic and antiarteriosclerotic drugs, initial encounter: Secondary | ICD-10-CM | POA: Diagnosis present

## 2011-12-15 DIAGNOSIS — I771 Stricture of artery: Secondary | ICD-10-CM | POA: Diagnosis present

## 2011-12-15 DIAGNOSIS — I119 Hypertensive heart disease without heart failure: Secondary | ICD-10-CM | POA: Diagnosis present

## 2011-12-15 DIAGNOSIS — I2510 Atherosclerotic heart disease of native coronary artery without angina pectoris: Secondary | ICD-10-CM | POA: Diagnosis present

## 2011-12-15 DIAGNOSIS — Z8673 Personal history of transient ischemic attack (TIA), and cerebral infarction without residual deficits: Secondary | ICD-10-CM | POA: Diagnosis present

## 2011-12-15 DIAGNOSIS — M6281 Muscle weakness (generalized): Secondary | ICD-10-CM | POA: Diagnosis present

## 2011-12-15 DIAGNOSIS — I2789 Other specified pulmonary heart diseases: Secondary | ICD-10-CM | POA: Diagnosis present

## 2011-12-15 HISTORY — DX: Anxiety disorder, unspecified: F41.9

## 2011-12-15 HISTORY — DX: Gastro-esophageal reflux disease without esophagitis: K21.9

## 2011-12-15 HISTORY — DX: Shortness of breath: R06.02

## 2011-12-15 HISTORY — DX: Depression, unspecified: F32.A

## 2011-12-15 HISTORY — DX: Major depressive disorder, single episode, unspecified: F32.9

## 2011-12-15 NOTE — ED Notes (Signed)
Patient complaining of left hand weakness and left facial numbness since 2000 tonight.

## 2011-12-16 ENCOUNTER — Emergency Department (HOSPITAL_COMMUNITY): Payer: BC Managed Care – PPO

## 2011-12-16 ENCOUNTER — Encounter (HOSPITAL_COMMUNITY): Payer: Self-pay | Admitting: Internal Medicine

## 2011-12-16 DIAGNOSIS — E119 Type 2 diabetes mellitus without complications: Secondary | ICD-10-CM

## 2011-12-16 DIAGNOSIS — B372 Candidiasis of skin and nail: Secondary | ICD-10-CM | POA: Diagnosis present

## 2011-12-16 DIAGNOSIS — I6350 Cerebral infarction due to unspecified occlusion or stenosis of unspecified cerebral artery: Principal | ICD-10-CM

## 2011-12-16 DIAGNOSIS — Z8673 Personal history of transient ischemic attack (TIA), and cerebral infarction without residual deficits: Secondary | ICD-10-CM | POA: Diagnosis present

## 2011-12-16 DIAGNOSIS — R42 Dizziness and giddiness: Secondary | ICD-10-CM

## 2011-12-16 HISTORY — PX: TRANSTHORACIC ECHOCARDIOGRAM: SHX275

## 2011-12-16 LAB — DIFFERENTIAL
Eosinophils Absolute: 0.2 10*3/uL (ref 0.0–0.7)
Eosinophils Relative: 3 % (ref 0–5)
Lymphs Abs: 1.9 10*3/uL (ref 0.7–4.0)
Monocytes Relative: 9 % (ref 3–12)

## 2011-12-16 LAB — PROTIME-INR: Prothrombin Time: 12.3 seconds (ref 11.6–15.2)

## 2011-12-16 LAB — BASIC METABOLIC PANEL
BUN: 29 mg/dL — ABNORMAL HIGH (ref 6–23)
GFR calc Af Amer: 47 mL/min — ABNORMAL LOW (ref >90)
GFR calc non Af Amer: 41 mL/min — ABNORMAL LOW (ref >90)
Potassium: 3.7 mEq/L (ref 3.5–5.1)
Sodium: 135 mEq/L (ref 135–145)

## 2011-12-16 LAB — CBC
Hemoglobin: 11.6 g/dL — ABNORMAL LOW (ref 12.0–15.0)
MCH: 29.1 pg (ref 26.0–34.0)
MCV: 86.7 fL (ref 78.0–100.0)
RBC: 3.99 MIL/uL (ref 3.87–5.11)

## 2011-12-16 LAB — GLUCOSE, CAPILLARY
Glucose-Capillary: 264 mg/dL — ABNORMAL HIGH (ref 70–99)
Glucose-Capillary: 202 mg/dL — ABNORMAL HIGH (ref 70–99)

## 2011-12-16 LAB — RAPID URINE DRUG SCREEN, HOSP PERFORMED
Amphetamines: NOT DETECTED
Tetrahydrocannabinol: NOT DETECTED

## 2011-12-16 LAB — URINALYSIS, ROUTINE W REFLEX MICROSCOPIC
Bilirubin Urine: NEGATIVE
Nitrite: NEGATIVE
Protein, ur: 30 mg/dL — AB
Specific Gravity, Urine: 1.01 (ref 1.005–1.030)
Urobilinogen, UA: 0.2 mg/dL (ref 0.0–1.0)

## 2011-12-16 LAB — URINE MICROSCOPIC-ADD ON

## 2011-12-16 MED ORDER — NEBIVOLOL HCL 2.5 MG PO TABS
2.5000 mg | ORAL_TABLET | Freq: Every day | ORAL | Status: DC
  Administered 2011-12-16 – 2011-12-18 (×3): 2.5 mg via ORAL
  Filled 2011-12-16 (×4): qty 1

## 2011-12-16 MED ORDER — SENNOSIDES-DOCUSATE SODIUM 8.6-50 MG PO TABS: 1.0000 | ORAL_TABLET | Freq: Every evening | ORAL | Status: DC | PRN

## 2011-12-16 MED ORDER — ESCITALOPRAM OXALATE 10 MG PO TABS
20.0000 mg | ORAL_TABLET | Freq: Every day | ORAL | Status: DC
  Administered 2011-12-16 – 2011-12-18 (×3): 20 mg via ORAL
  Filled 2011-12-16 (×3): qty 2

## 2011-12-16 MED ORDER — ASPIRIN 81 MG PO CHEW
81.0000 mg | CHEWABLE_TABLET | Freq: Once | ORAL | Status: AC
  Administered 2011-12-16: 81 mg via ORAL
  Filled 2011-12-16: qty 1

## 2011-12-16 MED ORDER — PANTOPRAZOLE SODIUM 40 MG PO TBEC
80.0000 mg | DELAYED_RELEASE_TABLET | Freq: Every day | ORAL | Status: DC
  Administered 2011-12-16 – 2011-12-18 (×3): 80 mg via ORAL
  Filled 2011-12-16 (×3): qty 2

## 2011-12-16 MED ORDER — NYSTATIN 100000 UNIT/GM EX CREA
TOPICAL_CREAM | Freq: Two times a day (BID) | CUTANEOUS | Status: DC
  Administered 2011-12-16 – 2011-12-18 (×5): via TOPICAL
  Filled 2011-12-16: qty 15

## 2011-12-16 MED ORDER — SODIUM CHLORIDE 0.9 % IV BOLUS (SEPSIS)
500.0000 mL | INTRAVENOUS | Status: AC
  Administered 2011-12-16: 500 mL via INTRAVENOUS

## 2011-12-16 MED ORDER — ACETAMINOPHEN 650 MG RE SUPP: 650.0000 mg | RECTAL | Status: DC | PRN

## 2011-12-16 MED ORDER — OMEGA-3-ACID ETHYL ESTERS 1 G PO CAPS
2.0000 g | ORAL_CAPSULE | Freq: Every day | ORAL | Status: DC
  Administered 2011-12-16 – 2011-12-18 (×3): 2 g via ORAL
  Filled 2011-12-16 (×3): qty 2

## 2011-12-16 MED ORDER — ACETAMINOPHEN 325 MG PO TABS: 650.0000 mg | ORAL_TABLET | ORAL | Status: DC | PRN

## 2011-12-16 MED ORDER — LEVOTHYROXINE SODIUM 112 MCG PO TABS
112.0000 ug | ORAL_TABLET | Freq: Every day | ORAL | Status: DC
  Administered 2011-12-16 – 2011-12-18 (×3): 112 ug via ORAL
  Filled 2011-12-16 (×4): qty 1

## 2011-12-16 MED ORDER — INSULIN ASPART 100 UNIT/ML ~~LOC~~ SOLN
0.0000 [IU] | Freq: Three times a day (TID) | SUBCUTANEOUS | Status: DC
  Administered 2011-12-16: 3 [IU] via SUBCUTANEOUS
  Administered 2011-12-16 (×2): 5 [IU] via SUBCUTANEOUS
  Administered 2011-12-17: 7 [IU] via SUBCUTANEOUS
  Administered 2011-12-17 – 2011-12-18 (×3): 3 [IU] via SUBCUTANEOUS
  Administered 2011-12-18: 2 [IU] via SUBCUTANEOUS

## 2011-12-16 MED ORDER — ALPRAZOLAM 0.5 MG PO TABS
0.5000 mg | ORAL_TABLET | Freq: Every evening | ORAL | Status: DC | PRN
  Administered 2011-12-16 – 2011-12-17 (×3): 0.5 mg via ORAL
  Filled 2011-12-16 (×3): qty 1

## 2011-12-16 MED ORDER — SODIUM CHLORIDE 0.9 % IV SOLN
INTRAVENOUS | Status: DC
  Administered 2011-12-16: 04:00:00 via INTRAVENOUS
  Filled 2011-12-16: qty 1000

## 2011-12-16 MED ORDER — HYDRALAZINE HCL 25 MG PO TABS
50.0000 mg | ORAL_TABLET | Freq: Three times a day (TID) | ORAL | Status: DC
  Administered 2011-12-16 – 2011-12-18 (×8): 50 mg via ORAL
  Filled 2011-12-16 (×8): qty 2

## 2011-12-16 MED ORDER — ISOSORBIDE MONONITRATE ER 60 MG PO TB24
60.0000 mg | ORAL_TABLET | Freq: Every day | ORAL | Status: DC
  Administered 2011-12-16 – 2011-12-18 (×3): 60 mg via ORAL
  Filled 2011-12-16 (×3): qty 1

## 2011-12-16 MED ORDER — SODIUM CHLORIDE 0.9 % IV SOLN: INTRAVENOUS | Status: DC

## 2011-12-16 MED ORDER — ONDANSETRON HCL 4 MG/2ML IJ SOLN: 4.0000 mg | Freq: Four times a day (QID) | INTRAMUSCULAR | Status: DC | PRN

## 2011-12-16 MED ORDER — INSULIN GLARGINE 100 UNIT/ML ~~LOC~~ SOLN
36.0000 [IU] | Freq: Every day | SUBCUTANEOUS | Status: DC
  Administered 2011-12-16 – 2011-12-17 (×2): 36 [IU] via SUBCUTANEOUS

## 2011-12-16 MED ORDER — POTASSIUM CHLORIDE IN NACL 20-0.9 MEQ/L-% IV SOLN
INTRAVENOUS | Status: DC
  Administered 2011-12-16 – 2011-12-17 (×2): via INTRAVENOUS

## 2011-12-16 MED ORDER — INSULIN ASPART 100 UNIT/ML ~~LOC~~ SOLN
0.0000 [IU] | Freq: Every day | SUBCUTANEOUS | Status: DC
  Administered 2011-12-16 – 2011-12-17 (×2): 2 [IU] via SUBCUTANEOUS

## 2011-12-16 NOTE — Consult Note (Signed)
[name], [name] NO.:  [id number]  MEDICAL RECORD NO.:  [id number]  LOCATION:  A303                          FACILITY:  [facility]  PHYSICIAN:  [name], M.D. DATE OF BIRTH:  [birth year]  DATE OF CONSULTATION: DATE OF DISCHARGE:                                CONSULTATION   This is a 66 year old right-handed white female who presents with acute onset of left hemibody numbness including the facial area on the left side.  The symptoms are also associated with weakness of the left hand and left lower extremity.  The symptoms presented rather acutely and got worse over several hours.  She presented to the hospital for further evaluation.  CT scan of the brain shows new right cerebellar hypodensity consistent with an infarct that was not seen on the previous scan done about 6 weeks ago.  The patient has a significant history of GI bleed, status post polypectomy.  She in fact recorded 50 units of packed RBCs. The postpolypectomy occurred after she was admitted for chest pain and given heparin bolus for the symptoms.  Unfortunately again, she had the bleed, which occurred repeatedly over few days and requiring multiple transfusions along with repeat colonoscopy to inject the site of bleeding.  There is no history of previous infarcts.  She currently does not report chest pain or shortness of breath.  She does report having headaches and also complained of chronic low back pain.  Apparently, she had history of myositis induced by statins.  PHYSICAL EXAMINATION:  GENERAL:  Shows a pleasant, overweight lady, in no acute distress.  She is sleeping, but easily arousable. HEENT EVALUATION:  Neck is supple.  Head is normocephalic, atraumatic. She does have a large tongue and crowding in the posterior space. ABDOMEN:  Soft. EXTREMITIES:  No edema. NEUROLOGIC:  Mentation:  She is lucid and coherent when awakened. Speech is normal.  Language and cognition are also  intact.  Cranial nerve evaluation shows the pupils are equal, round, and reactive to light and accommodation.  Extraocular movements are full.  Facial muscle strength is symmetric.  She has good strength of upper and lower facial muscles.  Tongue is midline.  Uvula midline.  Shoulder shrugs normal. Motor examination shows mild pronator drift in the left upper extremity. She had had significant impairment of fine finger movements, left hand. Finger tapping is also markedly impaired, strength is 4 involving the hand.  Left lower extremity also shows proximal muscle weakness graded as 4 or 5.  Distal strength is 5.  Right side shows normal tone, bulk, and strength.  Reflex is 2+ throughout.  Plantars are both downgoing. Sensation, actually shows some dysesthesia of left upper extremity.  She apparently has DVT in that side from the PICC line.  This is being watch given her previous history of GI bleed.  Coordination shows no dysmetria, no tremors, no past-pointing.  ASSESSMENT:  Lady with child during the case.  She has had recent gastrointestinal bleed, which I think appears to be an issue with polypectomy and postpolypectomy heparin in the setting of evaluation for chest pain.  It appears that she also was previously on aspirin and Plavix combination.  Both of these have been discontinued.  She now presents with acute stroke seen on CT scan.  I want to recommend that she be placed on low-dose aspirin.  I think it would be risk of bleeds. Now that she is a month out should be low, I believe that the benefit of secondary stroke prevention should outweigh the risk.  She has an echo, which is reviewed and does show ejection fraction is 60%, right ventricular volume pressure is somewhat elevated and she does have severe calcification of the mitral valve.  I do not believe that she has had embolic event.  I think it is probably a thrombotic event given suggestions of doing the MRI, but I do not  believe this will add much as the stroke is seen on CT scan and likely would not change our management.  We will follow up with the carotid Doppler results.  Thanks for this consultation.     [name], M.D.     KAD/MEDQ  D:  12/16/2011  T:  12/16/2011  Job:  [id number]

## 2011-12-16 NOTE — Progress Notes (Signed)
OT Cancellation Note  Treatment cancelled today due to patient's refusal to participate - patient was sleeping and family member requested that we not awaken patient for evaluation.  Shirlean Mylar, OTR/L  12/16/2011, 11:49 AM

## 2011-12-16 NOTE — Progress Notes (Signed)
UR Chart Review Completed  

## 2011-12-16 NOTE — Progress Notes (Signed)
[name], MARCOM NO.:  [id number]  MEDICAL RECORD NO.:  [id number]  LOCATION:  A303                          FACILITY:  [facility]  PHYSICIAN:  Dandy Lazaro G. Schylar Wuebker, MD   DATE OF BIRTH:  April 15, 1946  DATE OF PROCEDURE: DATE OF DISCHARGE:                                PROGRESS NOTE   This patient is relatively comfortable this morning with exception of slight weakness of left facial muscles and numbness on left hand.  She was admitted with what was felt to be new-onset stroke.  A CT of the head showed an old right cerebellar infarct.  Apparently subacute in age, diffuse small vessel ischemic changes.  OBJECTIVE:  VITAL SIGNS:  Blood pressure 145/67, respirations 20, pulse 67, temp 98.5. LUNGS:  Clear to P and A. CARDIAC:  Heart regular rhythm. ABDOMEN:  No palpable organs or masses. NEUROLOGIC:  Slight weakness of left upper extremity.  Numbness in left side of her face.  ASSESSMENT:  The patient was admitted with a new-onset neurological symptoms, weakness and numbness left side of face.  Possible subacute infarction in the right cerebellum.  The patient does have a history of renal artery stenosis with stenting, coronary artery disease, streptococcal endocarditis.  PLAN:  To obtain a Neurology consult.  We will have Cardiology see her as well.  Continue her current meds.     Mikki Ziff G. Renard Matter, MD     AGM/MEDQ  D:  12/16/2011  T:  12/16/2011  Job:  [id number]

## 2011-12-16 NOTE — Progress Notes (Signed)
Patient has a rash under both breasts and under arms. May need med for yeast infection.

## 2011-12-16 NOTE — ED Notes (Signed)
Assisted patient to restroom, she stated that she is still a little dizzy and that her left hand is still weak.

## 2011-12-16 NOTE — Progress Notes (Signed)
*  PRELIMINARY RESULTS* Echocardiogram 2D Echocardiogram has been performed.  [name] 12/16/2011, 11:55 AM

## 2011-12-16 NOTE — Consult Note (Signed)
Reason for Consult: Referring Physician:   [name] is an 66 y.o. female.  HPI:   Past Medical History  Diagnosis Date  . HTN (hypertension)   . Elevated troponin level 09/28/11    Type II MI - not ACS related  . Diastolic dysfunction, left ventricle     with previous Diastolic HF  . Diabetes mellitus   . High cholesterol   . Kidney disease   . Cataracts, bilateral   . 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 Pulmonary HTN-severe- due to diastolic dysfunction 09/29/2011    Moderate to Severe Pulm HTN.  Marland Kitchen Acute renal failure 09/29/2011  . 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 HYPOTHYROIDISM 06/19/2008    Qualifier: Diagnosis of  By: Excell Seltzer CMA, Lawson Fiscal    . Renal artery stenosis     s/p stents (restenosis)  . Subclavian arterial stenosis   . Peripheral neuropathy   . Obstructive sleep apnea on nocturnal CPAP 06/20/2008    Qualifier: Diagnosis of  By: Vernie Murders    . Unstable angina 10/19/2011  . Coronary artery disease   . Myocardial infarction   . Pericarditis   . Anxiety   . Depression   . Shortness of breath   . Pneumonia   . CHF (congestive heart failure)   . GERD (gastroesophageal reflux disease)     Past Surgical History  Procedure Date  . Cardiac catheterization 01/2011    Minimal luminal irregularties  . Laparoscopic tubal ligation   . Renal artery stent     x 2 (restenosis)  . Colonoscopy 10/12/2011    Procedure: COLONOSCOPY;  Surgeon: Louis Meckel, MD;  Location[facility] ENDOSCOPY;  Service: Endoscopy;  Laterality: N/A;  . Esophagogastroduodenoscopy 10/12/2011    Procedure: ESOPHAGOGASTRODUODENOSCOPY (EGD);  Surgeon: Louis Meckel, MD;  Location: [facility] ENDOSCOPY;  Service: Endoscopy;  Laterality: N/A;  . Tubal ligation   . Colonoscopy 10/21/2011    Procedure: COLONOSCOPY;  Surgeon: Hilarie Fredrickson, MD;  Location: [facility] ENDOSCOPY;  Service: Endoscopy;  Laterality: N/A;  . Colonoscopy  10/25/2011    Procedure: COLONOSCOPY;  Surgeon: Beverley Fiedler, MD;  Location: [facility] ENDOSCOPY;  Service: Gastroenterology;  Laterality: N/A;  . Colonoscopy 10/23/2011    Procedure: COLONOSCOPY;  Surgeon: Hilarie Fredrickson, MD;  Location: [facility] ENDOSCOPY;  Service: Endoscopy;  Laterality: N/A;  . Back surgery   . Colon surgery     Family History  Problem Relation Age of Onset  . Heart disease Mother   . Heart attack Mother   . Heart disease Father   . Heart attack Father   . Heart disease Sister   . Heart disease Daughter     Social History:  reports that she quit smoking about 7 years ago. Her smoking use included Cigarettes. She has a 25 pack-year smoking history. She does not have any smokeless tobacco history on file. She reports that she does not drink alcohol or use illicit drugs.  Allergies:  Allergies  Allergen Reactions  . Codeine Anaphylaxis    swelling  . Lidocaine Anaphylaxis  . Atenolol Other (See Comments)    Unknown  . Bystolic (Nebivolol Hcl)     Cannot tolerate greater then 5 mg, causes chest pain  . Ciprofloxacin Itching  . Metformin Nausea And Vomiting  . Metoprolol Other (See Comments)    Burns ears  . Morphine And Related Other (See Comments)    Makes patient feel like she is floating  .  Penicillins Rash    Medications:  Prior to Admission medications   Medication Sig Start Date End Date Taking? Authorizing Provider  ALPRAZolam Prudy Feeler) 0.5 MG tablet Take 0.5 mg by mouth at bedtime as needed. For anxiety   Yes Historical Provider, MD  Cholecalciferol (VITAMIN D3) 2000 UNITS TABS Take 1 tablet by mouth at bedtime.     Yes Historical Provider, MD  escitalopram (LEXAPRO) 20 MG tablet Take 20 mg by mouth daily.     Yes Historical Provider, MD  esomeprazole (NEXIUM) 40 MG capsule Take 40 mg by mouth daily.   Yes Historical Provider, MD  fish oil-omega-3 fatty acids 1000 MG capsule Take 1 g by mouth 2 (two) times daily.   Yes Historical Provider, MD  furosemide (LASIX) 20  MG tablet Take 20 mg by mouth daily.   Yes Historical Provider, MD  hydrALAZINE (APRESOLINE) 50 MG tablet Take 50 mg by mouth 3 (three) times daily. 10/06/11 10/05/12 Yes Adeline C Viyuoh, MD  insulin glargine (LANTUS) 100 UNIT/ML injection Inject 36 Units into the skin at bedtime. 10/30/11  Yes Calvert Cantor, MD  insulin lispro (HUMALOG) 100 UNIT/ML injection Inject 10-30 Units into the skin 3 (three) times daily before meals. Uses according to a sliding scale at home.   Yes Historical Provider, MD  isosorbide mononitrate (IMDUR) 60 MG 24 hr tablet Take 60 mg by mouth daily.     Yes Historical Provider, MD  levothyroxine (SYNTHROID, LEVOTHROID) 112 MCG tablet Take 112 mcg by mouth daily.     Yes Historical Provider, MD  nebivolol (BYSTOLIC) 2.5 MG tablet Take 2.5 mg by mouth daily.     Yes Historical Provider, MD  potassium chloride SA (K-DUR,KLOR-CON) 20 MEQ tablet Take 1 tablet (20 mEq total) by mouth daily. 10/13/11  Yes Laveda Norman, MD   Scheduled Meds:   . escitalopram  20 mg Oral Daily  . hydrALAZINE  50 mg Oral Q8H  . insulin aspart  0-5 Units Subcutaneous QHS  . insulin aspart  0-9 Units Subcutaneous TID WC  . insulin glargine  36 Units Subcutaneous QHS  . isosorbide mononitrate  60 mg Oral Daily  . levothyroxine  112 mcg Oral Daily  . nebivolol  2.5 mg Oral Daily  . nystatin cream   Topical BID  . omega-3 acid ethyl esters  2 g Oral Daily  . pantoprazole  80 mg Oral Q1200  . sodium chloride  500 mL Intravenous STAT   Continuous Infusions:   . 0.9 % NaCl with KCl 20 mEq / L 50 mL/hr at 12/16/11 1157  . DISCONTD: sodium chloride 0.9 % 1,000 mL with potassium chloride 20 mEq infusion    . DISCONTD: sodium chloride 0.9 % 1,000 mL with potassium chloride 20 mEq infusion 50 mL/hr at 12/16/11 0415   PRN Meds:.acetaminophen, acetaminophen, ALPRAZolam, ondansetron (ZOFRAN) IV, senna-docusate   Results for orders placed during the hospital encounter of 12/15/11 (from the past 48 hour(s))    PROTIME-INR     Status: Normal   Collection Time   12/16/11 12:21 AM      Component Value Range Comment   Prothrombin Time 12.3  11.6 - 15.2 seconds    INR 0.90  0.00 - 1.49   APTT     Status: Normal   Collection Time   12/16/11 12:21 AM      Component Value Range Comment   aPTT 26  24 - 37 seconds   CBC     Status: Abnormal  Collection Time   12/16/11 12:21 AM      Component Value Range Comment   WBC 7.1  4.0 - 10.5 K/uL    RBC 3.99  3.87 - 5.11 MIL/uL    Hemoglobin 11.6 (*) 12.0 - 15.0 g/dL    HCT 65.7 (*) 84.6 - 46.0 %    MCV 86.7  78.0 - 100.0 fL    MCH 29.1  26.0 - 34.0 pg    MCHC 33.5  30.0 - 36.0 g/dL    RDW 96.2  95.2 - 84.1 %    Platelets 256  150 - 400 K/uL   DIFFERENTIAL     Status: Normal   Collection Time   12/16/11 12:21 AM      Component Value Range Comment   Neutrophils Relative 60  43 - 77 %    Neutro Abs 4.3  1.7 - 7.7 K/uL    Lymphocytes Relative 27  12 - 46 %    Lymphs Abs 1.9  0.7 - 4.0 K/uL    Monocytes Relative 9  3 - 12 %    Monocytes Absolute 0.6  0.1 - 1.0 K/uL    Eosinophils Relative 3  0 - 5 %    Eosinophils Absolute 0.2  0.0 - 0.7 K/uL    Basophils Relative 1  0 - 1 %    Basophils Absolute 0.1  0.0 - 0.1 K/uL   TROPONIN I     Status: Normal   Collection Time   12/16/11 12:21 AM      Component Value Range Comment   Troponin I <0.30  <0.30 ng/mL   BASIC METABOLIC PANEL     Status: Abnormal   Collection Time   12/16/11 12:21 AM      Component Value Range Comment   Sodium 135  135 - 145 mEq/L    Potassium 3.7  3.5 - 5.1 mEq/L    Chloride 99  96 - 112 mEq/L    CO2 24  19 - 32 mEq/L    Glucose, Bld 282 (*) 70 - 99 mg/dL    BUN 29 (*) 6 - 23 mg/dL    Creatinine, Ser 3.24 (*) 0.50 - 1.10 mg/dL    Calcium 40.1  8.4 - 10.5 mg/dL    GFR calc non Af Amer 41 (*) >90 mL/min    GFR calc Af Amer 47 (*) >90 mL/min   URINE RAPID DRUG SCREEN (HOSP PERFORMED)     Status: Normal   Collection Time   12/16/11 12:27 AM      Component Value Range Comment    Opiates NONE DETECTED  NONE DETECTED    Cocaine NONE DETECTED  NONE DETECTED    Benzodiazepines NONE DETECTED  NONE DETECTED    Amphetamines NONE DETECTED  NONE DETECTED    Tetrahydrocannabinol NONE DETECTED  NONE DETECTED    Barbiturates NONE DETECTED  NONE DETECTED   URINALYSIS, ROUTINE W REFLEX MICROSCOPIC     Status: Abnormal   Collection Time   12/16/11 12:27 AM      Component Value Range Comment   Color, Urine STRAW (*) YELLOW    APPearance CLEAR  CLEAR    Specific Gravity, Urine 1.010  1.005 - 1.030    pH 6.0  5.0 - 8.0    Glucose, UA 500 (*) NEGATIVE mg/dL    Hgb urine dipstick NEGATIVE  NEGATIVE    Bilirubin Urine NEGATIVE  NEGATIVE    Ketones, ur NEGATIVE  NEGATIVE mg/dL    Protein,  ur 30 (*) NEGATIVE mg/dL    Urobilinogen, UA 0.2  0.0 - 1.0 mg/dL    Nitrite NEGATIVE  NEGATIVE    Leukocytes, UA NEGATIVE  NEGATIVE   URINE MICROSCOPIC-ADD ON     Status: Normal   Collection Time   12/16/11 12:27 AM      Component Value Range Comment   Squamous Epithelial / LPF RARE  RARE    WBC, UA 0-2  <3 WBC/hpf    RBC / HPF 0-2  <3 RBC/hpf    Bacteria, UA RARE  RARE   GLUCOSE, CAPILLARY     Status: Abnormal   Collection Time   12/16/11 12:32 AM      Component Value Range Comment   Glucose-Capillary 255 (*) 70 - 99 mg/dL    Comment 1 Notify RN     HEMOGLOBIN A1C     Status: Abnormal   Collection Time   12/16/11  4:00 AM      Component Value Range Comment   Hemoglobin A1C 6.8 (*) <5.7 %    Mean Plasma Glucose 148 (*) <117 mg/dL   GLUCOSE, CAPILLARY     Status: Abnormal   Collection Time   12/16/11  7:16 AM      Component Value Range Comment   Glucose-Capillary 235 (*) 70 - 99 mg/dL   GLUCOSE, CAPILLARY     Status: Abnormal   Collection Time   12/16/11 11:48 AM      Component Value Range Comment   Glucose-Capillary 264 (*) 70 - 99 mg/dL   GLUCOSE, CAPILLARY     Status: Abnormal   Collection Time   12/16/11  4:31 PM      Component Value Range Comment   Glucose-Capillary 266 (*)  70 - 99 mg/dL     Ct Head Wo Contrast  12/16/2011  *RADIOLOGY REPORT*  Clinical Data: Left facial and extremity weakness today.  History of hypertension and diabetes.  CT HEAD WITHOUT CONTRAST  Technique:  Contiguous axial images were obtained from the base of the skull through the vertex without contrast.  Comparison: Head CT 09/27/2011.  Findings: There is no evidence of acute intracranial hemorrhage, mass lesion, brain edema or extra-axial fluid collection. Extensive chronic periventricular and subcortical white matter disease appears similar.  However, there is a new well circumscribed low density lesion in the right cerebellum (images 9 and 10) consistent with an interval infarct.  This appears at least subacute in nature.  There is no evidence of cortical infarct.  The paranasal sinuses are clear.  There is some fluid within the right mastoid air cells.  No calvarial abnormalities are identified.  IMPRESSION:  1.  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 2.  No evidence of acute cortical infarct or hemorrhage. 3.  Diffuse small vessel ischemic changes. 4.  New right mastoid effusion.  Original Report Authenticated By: Gerrianne Scale, M.D.    Review of Systems  Constitutional: Negative.   HENT: Negative.   Eyes: Negative.   Respiratory: Negative.   Cardiovascular: Negative.   Genitourinary: Negative.   Skin: Negative.    Blood pressure 152/68, pulse 77, temperature 98.3 F (36.8 C), temperature source Oral, resp. rate 18, height 5\' 6"  (1.676 m), weight 81.285 kg (179 lb 3.2 oz), SpO2 90.00%. Physical Exam  Assessment/Plan: See dict  Taraya Steward 12/16/2011, 8:40 PM

## 2011-12-16 NOTE — ED Provider Notes (Signed)
History     CSN: [id number]  Arrival date & time 12/15/11  2340   First MD Initiated Contact with Patient 12/15/11 2350      Chief Complaint  Patient presents with  . Numbness  . Weakness    (Consider location/radiation/quality/duration/timing/severity/associated sxs/prior treatment) HPI  [name] is a 66 y.o. female with a complicated medical history including recent strep endocarditis requiring OP antibiotics, who presents to the Emergency Department complaining of left sided facial numbness and numbness to the thumb and index finger than began around 2030 tonight. Patient states speech is a little difficult but no trouble swallowing. No other weakness. She has a h/o DVT in the right arm as a result of the PICC line placement. PICC was moved to the left arm for completion of antibiotics.   PCP Dr. Renard Matter ID Dr. Orvan Falconer  Past Medical History  Diagnosis Date  . HTN (hypertension)   . Elevated troponin level 09/28/11    Type II MI - not ACS related  . Diastolic dysfunction, left ventricle     with previous Diastolic HF  . Diabetes mellitus   . High cholesterol   . Kidney disease   . Cataracts, bilateral   . 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 Pulmonary HTN-severe- due to diastolic dysfunction 09/29/2011    Moderate to Severe Pulm HTN.  Marland Kitchen Acute renal failure 09/29/2011  . 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 HYPOTHYROIDISM 06/19/2008    Qualifier: Diagnosis of  By: Excell Seltzer CMA, Lawson Fiscal    . Renal artery stenosis     s/p stents (restenosis)  . Subclavian arterial stenosis   . Peripheral neuropathy   . Obstructive sleep apnea on nocturnal CPAP 06/20/2008    Qualifier: Diagnosis of  By: Vernie Murders    . Unstable angina 10/19/2011  . Coronary artery disease   . Myocardial infarction   . Pericarditis     Past Surgical History  Procedure Date  . Cardiac catheterization 01/2011   Minimal luminal irregularties  . Laparoscopic tubal ligation   . Renal artery stent     x 2 (restenosis)  . Colonoscopy 10/12/2011    Procedure: COLONOSCOPY;  Surgeon: Louis Meckel, MD;  Location: [facility] ENDOSCOPY;  Service: Endoscopy;  Laterality: N/A;  . Esophagogastroduodenoscopy 10/12/2011    Procedure: ESOPHAGOGASTRODUODENOSCOPY (EGD);  Surgeon: Louis Meckel, MD;  Location: [facility] ENDOSCOPY;  Service: Endoscopy;  Laterality: N/A;  . Tubal ligation   . Colonoscopy 10/21/2011    Procedure: COLONOSCOPY;  Surgeon: Hilarie Fredrickson, MD;  Location: [facility] ENDOSCOPY;  Service: Endoscopy;  Laterality: N/A;  . Colonoscopy 10/25/2011    Procedure: COLONOSCOPY;  Surgeon: Beverley Fiedler, MD;  Location: [facility] ENDOSCOPY;  Service: Gastroenterology;  Laterality: N/A;  . Colonoscopy 10/23/2011    Procedure: COLONOSCOPY;  Surgeon: Hilarie Fredrickson, MD;  Location: [facility] ENDOSCOPY;  Service: Endoscopy;  Laterality: N/A;    Family History  Problem Relation Age of Onset  . Heart disease Mother   . Heart attack Mother   . Heart disease Father   . Heart attack Father   . Heart disease Sister   . Heart disease Daughter     History  Substance Use Topics  . Smoking status: Former Smoker -- 1.0 packs/day for 25 years    Types: Cigarettes    Quit date: 05/23/2004  . Smokeless tobacco: Not on file  . Alcohol Use: No    OB History    Grav Para  Term Preterm Abortions TAB SAB Ect Mult Living                  Review of Systems  Constitutional: Negative for fever.       10 Systems reviewed and are negative for acute change except as noted in the HPI.  HENT: Negative for congestion.   Eyes: Negative for discharge and redness.  Respiratory: Negative for cough and shortness of breath.   Cardiovascular: Negative for chest pain.  Gastrointestinal: Negative for vomiting and abdominal pain.  Musculoskeletal: Negative for back pain.  Skin: Negative for rash.  Neurological: Negative for syncope, numbness and headaches.        Numbness and tingling to left side of face and to left thumb and index finger.  Psychiatric/Behavioral:       No behavior change.    Allergies  Atenolol; Bystolic; Ciprofloxacin; Codeine; Lidocaine; Metformin; Metoprolol; and Penicillins  Home Medications   Current Outpatient Rx  Name Route Sig Dispense Refill  . ALPRAZOLAM 0.5 MG PO TABS Oral Take 0.5 mg by mouth at bedtime as needed. For anxiety    . VITAMIN D3 2000 UNITS PO TABS Oral Take 1 tablet by mouth at bedtime.      Marland Kitchen ESCITALOPRAM OXALATE 20 MG PO TABS Oral Take 20 mg by mouth daily.      Marland Kitchen ESOMEPRAZOLE MAGNESIUM 40 MG PO CPDR Oral Take 40 mg by mouth daily as needed. For reflux    . FUROSEMIDE 40 MG PO TABS Oral Take 1 tablet (40 mg total) by mouth daily. 60 tablet 0  . HYDRALAZINE HCL 50 MG PO TABS Oral Take 50 mg by mouth 3 (three) times daily.    . INSULIN GLARGINE 100 UNIT/ML [place] SOLN Subcutaneous Inject 36 Units into the skin at bedtime. 10 mL   . ISOSORBIDE MONONITRATE ER 60 MG PO TB24 Oral Take 60 mg by mouth daily.      Marland Kitchen LEVOTHYROXINE SODIUM 112 MCG PO TABS Oral Take 112 mcg by mouth daily.      . NEBIVOLOL HCL 2.5 MG PO TABS Oral Take 2.5 mg by mouth daily.      Marland Kitchen FISH OIL 1000 MG PO CAPS Oral Take 4,000 mg by mouth daily.    Marland Kitchen POTASSIUM CHLORIDE CRYS ER 20 MEQ PO TBCR Oral Take 1 tablet (20 mEq total) by mouth daily. 30 tablet 0    BP 194/47  Pulse 71  Temp 98.5 F (36.9 C) (Oral)  Resp 18  Ht 5\' 6"  (1.676 m)  Wt 175 lb (79.379 kg)  BMI 28.25 kg/m2  SpO2 98%  Physical Exam  Nursing note and vitals reviewed. Constitutional: She is oriented to person, place, and time. She appears well-developed and well-nourished.       Awake, alert, nontoxic appearance.  HENT:  Head: Atraumatic.  Eyes: Right eye exhibits no discharge. Left eye exhibits no discharge.  Neck: Neck supple.  Cardiovascular: Normal rate and normal heart sounds.   Pulmonary/Chest: Effort normal and breath sounds normal. She exhibits no  tenderness.  Abdominal: Soft. Bowel sounds are normal. There is no tenderness. There is no rebound.  Musculoskeletal: She exhibits no tenderness.       Baseline ROM, no obvious new focal weakness.  Neurological: She is alert and oriented to person, place, and time.       Mental status and motor strength appears baseline for patient and situation.Patient is right handed. Major CN grossly intact.  Strength 5/5 equal bilat UE's and  LE's.  DTR 2/4 equal bilat UE's and LE's.  No gross sensory deficits.  Decreased sensation to light touch over thumb and index finger. Normal cerebellar testing bilat UE's and LE's.  No pronator drift.  Speech clear.  No facial droop.  No nystagmus.   Skin: No rash noted.  Psychiatric: She has a normal mood and affect.    ED Course  Procedures (including critical care time)  Results for orders placed during the hospital encounter of 12/15/11  PROTIME-INR      Component Value Range   Prothrombin Time 12.3  11.6 - 15.2 seconds   INR 0.90  0.00 - 1.49  APTT      Component Value Range   aPTT 26  24 - 37 seconds  CBC      Component Value Range   WBC 7.1  4.0 - 10.5 K/uL   RBC 3.99  3.87 - 5.11 MIL/uL   Hemoglobin 11.6 (*) 12.0 - 15.0 g/dL   HCT 98.1 (*) 19.1 - 47.8 %   MCV 86.7  78.0 - 100.0 fL   MCH 29.1  26.0 - 34.0 pg   MCHC 33.5  30.0 - 36.0 g/dL   RDW 29.5  62.1 - 30.8 %   Platelets 256  150 - 400 K/uL  DIFFERENTIAL      Component Value Range   Neutrophils Relative 60  43 - 77 %   Neutro Abs 4.3  1.7 - 7.7 K/uL   Lymphocytes Relative 27  12 - 46 %   Lymphs Abs 1.9  0.7 - 4.0 K/uL   Monocytes Relative 9  3 - 12 %   Monocytes Absolute 0.6  0.1 - 1.0 K/uL   Eosinophils Relative 3  0 - 5 %   Eosinophils Absolute 0.2  0.0 - 0.7 K/uL   Basophils Relative 1  0 - 1 %   Basophils Absolute 0.1  0.0 - 0.1 K/uL  TROPONIN I      Component Value Range   Troponin I <0.30  <0.30 ng/mL  URINE RAPID DRUG SCREEN (HOSP PERFORMED)      Component Value Range    Opiates NONE DETECTED  NONE DETECTED   Cocaine NONE DETECTED  NONE DETECTED   Benzodiazepines NONE DETECTED  NONE DETECTED   Amphetamines NONE DETECTED  NONE DETECTED   Tetrahydrocannabinol NONE DETECTED  NONE DETECTED   Barbiturates NONE DETECTED  NONE DETECTED  BASIC METABOLIC PANEL      Component Value Range   Sodium 135  135 - 145 mEq/L   Potassium 3.7  3.5 - 5.1 mEq/L   Chloride 99  96 - 112 mEq/L   CO2 24  19 - 32 mEq/L   Glucose, Bld 282 (*) 70 - 99 mg/dL   BUN 29 (*) 6 - 23 mg/dL   Creatinine, Ser 6.57 (*) 0.50 - 1.10 mg/dL   Calcium 84.6  8.4 - 96.2 mg/dL   GFR calc non Af Amer 41 (*) >90 mL/min   GFR calc Af Amer 47 (*) >90 mL/min  URINALYSIS, ROUTINE W REFLEX MICROSCOPIC      Component Value Range   Color, Urine STRAW (*) YELLOW   APPearance CLEAR  CLEAR   Specific Gravity, Urine 1.010  1.005 - 1.030   pH 6.0  5.0 - 8.0   Glucose, UA 500 (*) NEGATIVE mg/dL   Hgb urine dipstick NEGATIVE  NEGATIVE   Bilirubin Urine NEGATIVE  NEGATIVE   Ketones, ur NEGATIVE  NEGATIVE mg/dL   Protein,  ur 30 (*) NEGATIVE mg/dL   Urobilinogen, UA 0.2  0.0 - 1.0 mg/dL   Nitrite NEGATIVE  NEGATIVE   Leukocytes, UA NEGATIVE  NEGATIVE  GLUCOSE, CAPILLARY      Component Value Range   Glucose-Capillary 255 (*) 70 - 99 mg/dL   Comment 1 Notify RN    URINE MICROSCOPIC-ADD ON      Component Value Range   Squamous Epithelial / LPF RARE  RARE   WBC, UA 0-2  <3 WBC/hpf   RBC / HPF 0-2  <3 RBC/hpf   Bacteria, UA RARE  RARE   Ct Head Wo Contrast  12/16/2011  *RADIOLOGY REPORT*  Clinical Data: Left facial and extremity weakness today.  History of hypertension and diabetes.  CT HEAD WITHOUT CONTRAST  Technique:  Contiguous axial images were obtained from the base of the skull through the vertex without contrast.  Comparison: Head CT 09/27/2011.  Findings: There is no evidence of acute intracranial hemorrhage, mass lesion, brain edema or extra-axial fluid collection. Extensive chronic periventricular  and subcortical white matter disease appears similar.  However, there is a new well circumscribed low density lesion in the right cerebellum (images 9 and 10) consistent with an interval infarct.  This appears at least subacute in nature.  There is no evidence of cortical infarct.  The paranasal sinuses are clear.  There is some fluid within the right mastoid air cells.  No calvarial abnormalities are identified.  IMPRESSION:  1.  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 2.  No evidence of acute cortical infarct or hemorrhage. 3.  Diffuse small vessel ischemic changes. 4.  New right mastoid effusion.  Original Report Authenticated By: Gerrianne Scale, M.D.    Date: 07/252013  2351  Rate:74  Rhythm: normal sinus rhythm  QRS Axis: left  Intervals: normal  ST/T Wave abnormalities: normal  Conduction Disutrbances:none  Narrative Interpretation:   Old EKG Reviewed: unchanged c/w 10/21/2011  2:19 AM:  T/C to Dr. Orvan Falconer, hospitalist, case discussed, including:  HPI, pertinent PM/SHx, VS/PE, dx testing, ED course and treatment.  Agreeable to admission.  Requests to write temporary orders, telemetry bed to Dr. Renard Matter.   MDM  Patient with complicated past medical history including strep endocarditis, pulmonary embolism, RUE DVT, GI bleeding here with left sided facial numbness and numbness of left thumb and index finger. CT scans shows an interval right cerebellar infarct that is new since the CT done in May of 2013. Her symptoms however do not correlate with a cerebellar infarct. Labs significant for continued renal insufficiency and elevated glucose. We'll arrange admission. Spoke with Dr. Orvan Falconer, hospitalist, who will admit the patient for Dr. Renard Matter.Pt stable in ED with no significant deterioration in condition.The patient appears reasonably stabilized for admission considering the current resources, flow, and capabilities available in the ED at this time, and I doubt  any other [facility] requiring further screening and/or treatment in the ED prior to admission.  MDM Reviewed: previous chart, nursing note and vitals Reviewed previous: labs, ECG, x-ray and CT scan Interpretation: labs, ECG and CT scan Total time providing critical care: 40 minutes.          Nicoletta Dress. Colon Branch, MD 12/16/11 [phone]

## 2011-12-16 NOTE — Care Management Note (Unsigned)
    Page 1 of 2   12/16/2011     11:13:49 AM   CARE MANAGEMENT NOTE 12/16/2011  Patient:  [name], [name]   Account Number:  [id number]  Date Initiated:  12/16/2011  Documentation initiated by:  Sharrie Rothman  Subjective/Objective Assessment:   Pt admitted from home with CVA. Pt lives with husband and will return home at discharge. Pt is independent with ADL's. Pt is currently active with [facility] RN and PT.     Action/Plan:   CM will arrange resumption of HH per MD orders at discharge. Pt requested to speak with financial counselor.   Anticipated DC Date:  12/19/2011   Anticipated DC Plan:  HOME W HOME HEALTH SERVICES  In-house referral  Financial Counselor      DC Planning Services  CM consult      [facility] Choice  HOME HEALTH   Choice offered to / List presented to:  C-1 Patient        HH arranged  HH-1 RN  HH-2 PT      [facility] agency  [facility]   Status of service:  In process, will continue to follow Medicare Important Message given?   (If response is "NO", the following Medicare IM given date fields will be blank) Date Medicare IM given:   Date Additional Medicare IM given:    Discharge Disposition:    Per UR Regulation:    If discussed at Long Length of Stay Meetings, dates discussed:    Comments:  12/16/11 1113 Arlyss Queen, RN BSN CM

## 2011-12-16 NOTE — ED Notes (Signed)
Reports onset of left sided weakness, specifically left hand, at 2000 yesterday; c/o left sided facial numbness and slurred speech.  Pt is alert, oriented x 4, answers questions appropriately; no slurred speech noted, but pt states she feels that she is still slurring her speech. Placed on O2 2L/min per [place] and cardiac monitor.  IV access obtained and labs drawn.

## 2011-12-16 NOTE — H&P (Signed)
[facility] History and Physical  KATELEE SCHUPP EAV:[id number] DOB: [birth year] DOA: 12/16/2011    PCP:   Alice Reichert, MD  Cardiologist: Nanetta Batty M.D. Infectious disease: Dr. Cliffton Asters M.D.  gastroenterologist: Dr. Jena Gauss  Chief Complaint:  Sudden onset left-sided numbness 8 PM yesterday  HPI: [name] is an 66 y.o. female.  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  Patient was discharged from [facility] about 4 weeks ago, and has been getting physical therapy at home, but reports she has been dizzy since discharged with attendance to fall to either side. Now reports that since 8 PM this evening she had sudden onset of weakness and numbness of the left side of her face and numbness of the left hand with difficulty controlling the outer fingers, and a feeling as if her hand is swelling. She came to the emergency room where a CT scan of the brain was done it revealed a subacute infarct of the right cerebellum.   She reports that she has to be put to sleep for her last MRI, and had to be transported to another facility for that purpose.  Rewiew of Systems:   All systems negative except as marked bold or noted in the HPI;  Constitutional: Negative for malaise, fever and chills. ;  Eyes: Negative for eye pain, redness and discharge. ;  ENMT: Negative for ear pain, hoarseness, nasal congestion, sinus pressure and sore throat. ;  Cardiovascular: Negative for chest pain, palpitations, diaphoresis, dyspnea and peripheral edema. ;  Respiratory: Negative for cough,  hemoptysis, wheezing and stridor. ;  Gastrointestinal: Negative for nausea, vomiting, diarrhea, constipation, abdominal pain, melena, blood in stool, hematemesis, jaundice and rectal bleeding. unusual weight loss..  Genitourinary: Negative for frequency, dysuria, incontinence,flank pain and hematuria;  Musculoskeletal:  Negative for swelling and trauma.; chronic back pain and neck pain. Skin: . Negative for pruritus, rash, abrasions, bruising and skin lesion.; ulcerations; itchy dry skin; interiginous breast candida  Neuro: Negative for lightheadedness and neck stiffness. Negative for weakness, altered level of consciousness , altered mental status, extremity weakness, burning feet, involuntary movement, seizure and syncope; occ headache,  numbness and swelling of the right hand since the right upper extremity DVT  Psych: negative for anxiety,  insomnia, tearfulness, panic attacks, hallucinations, paranoia, suicidal or homicidal ideation ; depression,    Past Medical History  Diagnosis Date  . HTN (hypertension)   . Elevated troponin level 09/28/11    Type II MI - not ACS related  . Diastolic dysfunction, left ventricle     with previous Diastolic HF  . Diabetes mellitus   . High cholesterol   . Kidney disease   . Cataracts, bilateral   . 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 Pulmonary HTN-severe- due to diastolic dysfunction 09/29/2011    Moderate to Severe Pulm HTN.  Marland Kitchen Acute renal failure 09/29/2011  . 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 HYPOTHYROIDISM 06/19/2008    Qualifier: Diagnosis of  By: Excell Seltzer CMA, Lawson Fiscal    . Renal artery stenosis     s/p stents (restenosis)  . Subclavian arterial stenosis   . Peripheral neuropathy   .  Obstructive sleep apnea on nocturnal CPAP 06/20/2008    Qualifier: Diagnosis of  By: Vernie Murders    . Unstable angina 10/19/2011  . Coronary artery disease   . Myocardial  infarction   . Pericarditis     Past Surgical History  Procedure Date  . Cardiac catheterization 01/2011    Minimal luminal irregularties  . Laparoscopic tubal ligation   . Renal artery stent     x 2 (restenosis)  . Colonoscopy 10/12/2011    Procedure: COLONOSCOPY;  Surgeon: Louis Meckel, MD;  Location: [facility] ENDOSCOPY;  Service: Endoscopy;  Laterality: N/A;  . Esophagogastroduodenoscopy 10/12/2011    Procedure: ESOPHAGOGASTRODUODENOSCOPY (EGD);  Surgeon: Louis Meckel, MD;  Location: [facility] ENDOSCOPY;  Service: Endoscopy;  Laterality: N/A;  . Tubal ligation   . Colonoscopy 10/21/2011    Procedure: COLONOSCOPY;  Surgeon: Hilarie Fredrickson, MD;  Location: [facility] ENDOSCOPY;  Service: Endoscopy;  Laterality: N/A;  . Colonoscopy 10/25/2011    Procedure: COLONOSCOPY;  Surgeon: Beverley Fiedler, MD;  Location: [facility] ENDOSCOPY;  Service: Gastroenterology;  Laterality: N/A;  . Colonoscopy 10/23/2011    Procedure: COLONOSCOPY;  Surgeon: Hilarie Fredrickson, MD;  Location: [facility] ENDOSCOPY;  Service: Endoscopy;  Laterality: N/A;    Medications:  HOME MEDS: Prior to Admission medications   Medication Sig Start Date End Date Taking? Authorizing Provider  ALPRAZolam Prudy Feeler) 0.5 MG tablet Take 0.5 mg by mouth at bedtime as needed. For anxiety    Historical Provider, MD  Cholecalciferol (VITAMIN D3) 2000 UNITS TABS Take 1 tablet by mouth at bedtime.      Historical Provider, MD  escitalopram (LEXAPRO) 20 MG tablet Take 20 mg by mouth daily.      Historical Provider, MD  esomeprazole (NEXIUM) 40 MG capsule Take 40 mg by mouth daily as needed. For reflux    Historical Provider, MD  furosemide (LASIX) 40 MG tablet Take 1 tablet (40 mg total) by mouth daily. 10/13/11 10/12/12  Laveda Norman, MD  hydrALAZINE (APRESOLINE) 50 MG tablet Take 50 mg by mouth 3 (three) times daily. 10/06/11 10/05/12  Kela Millin, MD  insulin glargine (LANTUS) 100 UNIT/ML injection Inject 36 Units into the skin at bedtime. 10/30/11   Calvert Cantor, MD  isosorbide  mononitrate (IMDUR) 60 MG 24 hr tablet Take 60 mg by mouth daily.      Historical Provider, MD  levothyroxine (SYNTHROID, LEVOTHROID) 112 MCG tablet Take 112 mcg by mouth daily.      Historical Provider, MD  nebivolol (BYSTOLIC) 2.5 MG tablet Take 2.5 mg by mouth daily.      Historical Provider, MD  Omega-3 Fatty Acids (FISH OIL) 1000 MG CAPS Take 4,000 mg by mouth daily.    Historical Provider, MD  potassium chloride SA (K-DUR,KLOR-CON) 20 MEQ tablet Take 1 tablet (20 mEq total) by mouth daily. 10/13/11   Laveda Norman, MD     Allergies:  Allergies  Allergen Reactions  . Atenolol     unknown  . Bystolic (Nebivolol Hcl)     Cannot tolerate greater then 5 mg, causes chest pain  . Ciprofloxacin     unknown  . Codeine     swelling  . Lidocaine     Throat swelling  . Metformin     vomiting  . Metoprolol     unknown  . Penicillins     rash    Social History:   reports that she quit smoking about 7 years ago. Her smoking use included Cigarettes. She  has a 25 pack-year smoking history. She does not have any smokeless tobacco history on file. She reports that she does not drink alcohol or use illicit drugs.  Family History: Family History  Problem Relation Age of Onset  . Heart disease Mother   . Heart attack Mother   . Heart disease Father   . Heart attack Father   . Heart disease Sister   . Heart disease Daughter      Physical Exam: Filed Vitals:   12/16/11 0025 12/16/11 0027 12/16/11 0200 12/16/11 0308  BP: 168/47   173/89  Pulse: 78     Temp: 98.5 F (36.9 C) 98.5 F (36.9 C) 98 F (36.7 C) 98 F (36.7 C)  TempSrc: Oral  Oral Oral  Resp: 20  18 18   Height:      Weight:      SpO2: 99%  98% 99%   Blood pressure 173/89, pulse 78, temperature 98 F (36.7 C), temperature source Oral, resp. rate 18, height 5\' 6"  (1.676 m), weight 79.379 kg (175 lb), SpO2 99.00%.  GEN:  Pleasant obese middle-aged Caucasian lady  lying in the stretcher in no acute distress; cooperative  with exam PSYCH:  alert and oriented x4; does not appear anxious or depressed; affect is appropriate. HEENT: Mucous membranes pink and anicteric; PERRLA; EOM intact; no cervical lymphadenopathy nor thyromegaly or carotid bruit; no JVD; Breasts:: Not examined CHEST WALL: No tenderness CHEST: Normal respiration, clear to auscultation bilaterally HEART: Regular rate and rhythm; rough systolic murmur; no rubs or gallops BACK:  mild  kyphosis or scoliosis; no CVA tenderness ABDOMEN: Obese, soft non-tender; no masses, no organomegaly, normal abdominal bowel sounds; no pannus; no intertriginous candida. Rectal Exam: Not done EXTREMITIES:  age-appropriate arthropathy of the hands and knees;  puffiness of both hands;no  lower extremity edema; no ulcerations. Genitalia: not examined PULSES: 2+ and symmetric SKIN: Normal hydration no rash or ulceration; no intertriginous rash seen  CNS: mild left facial weakness; left hand resting in "claw" position. Deep tendon reflexes brisk and equal bilaterally lower extremity; Babinski down-going bilaterally   Labs on Admission:  Basic Metabolic Panel:  Lab 12/16/11 1610  NA 135  K 3.7  CL 99  CO2 24  GLUCOSE 282*  BUN 29*  CREATININE 1.35*  CALCIUM 10.3  MG --  PHOS --   Liver Function Tests: No results found for this basename: AST:5,ALT:5,ALKPHOS:5,BILITOT:5,PROT:5,ALBUMIN:5 in the last 168 hours No results found for this basename: LIPASE:5,AMYLASE:5 in the last 168 hours No results found for this basename: AMMONIA:5 in the last 168 hours CBC:  Lab 12/16/11 0021  WBC 7.1  NEUTROABS 4.3  HGB 11.6*  HCT 34.6*  MCV 86.7  PLT 256   Cardiac Enzymes:  Lab 12/16/11 0021  CKTOTAL --  CKMB --  CKMBINDEX --  TROPONINI <0.30   BNP: No components found with this basename: POCBNP:5 CBG:  Lab 12/16/11 0032  GLUCAP 255*    Results for orders placed during the hospital encounter of 12/15/11 (from the past 48 hour(s))  PROTIME-INR     Status:  Normal   Collection Time   12/16/11 12:21 AM      Component Value Range Comment   Prothrombin Time 12.3  11.6 - 15.2 seconds    INR 0.90  0.00 - 1.49   APTT     Status: Normal   Collection Time   12/16/11 12:21 AM      Component Value Range Comment   aPTT 26  24 -  37 seconds   CBC     Status: Abnormal   Collection Time   12/16/11 12:21 AM      Component Value Range Comment   WBC 7.1  4.0 - 10.5 K/uL    RBC 3.99  3.87 - 5.11 MIL/uL    Hemoglobin 11.6 (*) 12.0 - 15.0 g/dL    HCT 16.1 (*) 09.6 - 46.0 %    MCV 86.7  78.0 - 100.0 fL    MCH 29.1  26.0 - 34.0 pg    MCHC 33.5  30.0 - 36.0 g/dL    RDW 04.5  40.9 - 81.1 %    Platelets 256  150 - 400 K/uL   DIFFERENTIAL     Status: Normal   Collection Time   12/16/11 12:21 AM      Component Value Range Comment   Neutrophils Relative 60  43 - 77 %    Neutro Abs 4.3  1.7 - 7.7 K/uL    Lymphocytes Relative 27  12 - 46 %    Lymphs Abs 1.9  0.7 - 4.0 K/uL    Monocytes Relative 9  3 - 12 %    Monocytes Absolute 0.6  0.1 - 1.0 K/uL    Eosinophils Relative 3  0 - 5 %    Eosinophils Absolute 0.2  0.0 - 0.7 K/uL    Basophils Relative 1  0 - 1 %    Basophils Absolute 0.1  0.0 - 0.1 K/uL   TROPONIN I     Status: Normal   Collection Time   12/16/11 12:21 AM      Component Value Range Comment   Troponin I <0.30  <0.30 ng/mL   BASIC METABOLIC PANEL     Status: Abnormal   Collection Time   12/16/11 12:21 AM      Component Value Range Comment   Sodium 135  135 - 145 mEq/L    Potassium 3.7  3.5 - 5.1 mEq/L    Chloride 99  96 - 112 mEq/L    CO2 24  19 - 32 mEq/L    Glucose, Bld 282 (*) 70 - 99 mg/dL    BUN 29 (*) 6 - 23 mg/dL    Creatinine, Ser 9.14 (*) 0.50 - 1.10 mg/dL    Calcium 78.2  8.4 - 10.5 mg/dL    GFR calc non Af Amer 41 (*) >90 mL/min    GFR calc Af Amer 47 (*) >90 mL/min   URINE RAPID DRUG SCREEN (HOSP PERFORMED)     Status: Normal   Collection Time   12/16/11 12:27 AM      Component Value Range Comment   Opiates NONE DETECTED   NONE DETECTED    Cocaine NONE DETECTED  NONE DETECTED    Benzodiazepines NONE DETECTED  NONE DETECTED    Amphetamines NONE DETECTED  NONE DETECTED    Tetrahydrocannabinol NONE DETECTED  NONE DETECTED    Barbiturates NONE DETECTED  NONE DETECTED   URINALYSIS, ROUTINE W REFLEX MICROSCOPIC     Status: Abnormal   Collection Time   12/16/11 12:27 AM      Component Value Range Comment   Color, Urine STRAW (*) YELLOW    APPearance CLEAR  CLEAR    Specific Gravity, Urine 1.010  1.005 - 1.030    pH 6.0  5.0 - 8.0    Glucose, UA 500 (*) NEGATIVE mg/dL    Hgb urine dipstick NEGATIVE  NEGATIVE    Bilirubin Urine NEGATIVE  NEGATIVE  Ketones, ur NEGATIVE  NEGATIVE mg/dL    Protein, ur 30 (*) NEGATIVE mg/dL    Urobilinogen, UA 0.2  0.0 - 1.0 mg/dL    Nitrite NEGATIVE  NEGATIVE    Leukocytes, UA NEGATIVE  NEGATIVE   URINE MICROSCOPIC-ADD ON     Status: Normal   Collection Time   12/16/11 12:27 AM      Component Value Range Comment   Squamous Epithelial / LPF RARE  RARE    WBC, UA 0-2  <3 WBC/hpf    RBC / HPF 0-2  <3 RBC/hpf    Bacteria, UA RARE  RARE   GLUCOSE, CAPILLARY     Status: Abnormal   Collection Time   12/16/11 12:32 AM      Component Value Range Comment   Glucose-Capillary 255 (*) 70 - 99 mg/dL    Comment 1 Notify RN        Radiological Exams on Admission: Ct Head Wo Contrast  12/16/2011  *RADIOLOGY REPORT*  Clinical Data: Left facial and extremity weakness today.  History of hypertension and diabetes.  CT HEAD WITHOUT CONTRAST  Technique:  Contiguous axial images were obtained from the base of the skull through the vertex without contrast.  Comparison: Head CT 09/27/2011.  Findings: There is no evidence of acute intracranial hemorrhage, mass lesion, brain edema or extra-axial fluid collection. Extensive chronic periventricular and subcortical white matter disease appears similar.  However, there is a new well circumscribed low density lesion in the right cerebellum (images 9 and  10) consistent with an interval infarct.  This appears at least subacute in nature.  There is no evidence of cortical infarct.  The paranasal sinuses are clear.  There is some fluid within the right mastoid air cells.  No calvarial abnormalities are identified.  IMPRESSION:  1.  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 2.  No evidence of acute cortical infarct or hemorrhage. 3.  Diffuse small vessel ischemic changes. 4.  New right mastoid effusion.  Original Report Authenticated By: Gerrianne Scale, M.D.    EKG: Independently reviewed.  pending   Assessment/Plan Present on Admission:  .acute CVA (cerebral vascular accident)  .Pulmonary HTN-severe- due to diastolic dysfunction .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 .HYPOTHYROIDISM .HYPERTENSIVE CARDIOVASCULAR DISEASE .HYPERLIPIDEMIA, hx of statin induced myosistis .Diabetes mellitus .Chronic renal insufficiency, stage III (moderate)  .Intertriginous candidiasis   PLAN:  Patient has symptoms and signs of acute right hemispheric CVA, so we'll admit her for stroke workup and a neurology consult.  Will defer MRI arrangements to her primary care physician, or the neurologist. Because of recent serious bleed, will not give aspirin nor Plavix nor heparin DVT prophylaxis. Will give teds and SCDs. Consider consult in GI service for clearance for aspirin or Lovenox.  Patient likely has a subacute cerebellar CVA related to a recent significant blood loss anemia; it is possible she is having multiple embolic CVAs, is unlikely to be a candidate for long-term anticoagulation.  Continue management of her other chronic medical problem   Other plans as per orders.  Code Status: FULL CODE  Family Communication:  Daughter: nurse Gerrianne Scale [phone])  husband - Leanora Cover [phone]  Disposition Plan:  pending     Esbeydi Manago Nocturnist [facility] Pager  [phone]   12/16/2011, 3:59 AM

## 2011-12-17 LAB — HEMOGLOBIN A1C
Hgb A1c MFr Bld: 7.1 % — ABNORMAL HIGH (ref <5.7)
Mean Plasma Glucose: 157 mg/dL — ABNORMAL HIGH (ref <117)

## 2011-12-17 LAB — LIPID PANEL
Cholesterol: 264 mg/dL — ABNORMAL HIGH (ref 0–200)
Total CHOL/HDL Ratio: 8.3 RATIO

## 2011-12-17 LAB — GLUCOSE, CAPILLARY: Glucose-Capillary: 207 mg/dL — ABNORMAL HIGH (ref 70–99)

## 2011-12-17 MED ORDER — CLONIDINE HCL 0.1 MG PO TABS
0.1000 mg | ORAL_TABLET | ORAL | Status: DC | PRN
  Administered 2011-12-17: 0.1 mg via ORAL
  Filled 2011-12-17: qty 1

## 2011-12-17 NOTE — Progress Notes (Signed)
[name], KOOP NO.:  [id number]  MEDICAL RECORD NO.:  [id number]  LOCATION:  A303                          FACILITY:  [facility]  PHYSICIAN:  Araiya Tilmon G. Renard Matter, MD   DATE OF BIRTH:  May 02, 1946  DATE OF PROCEDURE: DATE OF DISCHARGE:                                PROGRESS NOTE   HISTORY OF PRESENT ILLNESS:  This patient remained relatively comfortable.  She does have slight weakness to left facial muscles and numbness left hand, slight weakness to left side.  She does have CT evidence of old and maybe more recent right cerebellar infarct.  She has diffuse small vessel ischemic changes.  She was seen by Neurology who felt this was definitely a subacute infarction in right cerebellum but they did not feel like she needed an MRI at this point.  The patient does have a history of renal artery stenosis and stenting and coronary artery disease and previous history of streptococcal endocarditis. __________low-dose aspirin.  OBJECTIVE:  VITAL SIGNS:  Blood pressure 190/66, respirations 18, pulse 67, temp 98.1. LUNGS:  Clear to P and A. HEART:  Regular rhythm. ABDOMEN:  No palpable organs or masses. NEUROLOGICAL:  The patient has a slight weakness in left upper extremity, numbness in left side of her face.  ASSESSMENT:  The patient does have subacute infarction in right cerebellum, history of renal artery stenosis with stenting, coronary artery disease and streptococcal endocarditis history.  PLAN:  To proceed with carotid Doppler ultrasound.  We will start low- dose aspirin and start to ambulate the patient.     Darrnell Mangiaracina G. Renard Matter, MD     AGM/MEDQ  D:  12/17/2011  T:  12/17/2011  Job:  [id number]

## 2011-12-17 NOTE — Progress Notes (Signed)
Speech Language Pathology Patient Details Name: [name] MRN: [id number] DOB: 11-30-1945 Today's Date: 12/17/2011 Time: 1191-4782 SLP Time Calculation (min): 6 min  Consult received for SLP eval and treat. Chart reviewed and patient interviewed at bedside. The patient passed the RN swallow screen upon admission for stroke like symptoms and is tolerating diet without incident when sitting upright in bed.  The patient states that she does have occasional word-finding difficulties that began shortly after her admission to [facility] last month. Pt may wish to pursue out patient SLP therapy if this continues and bothers her. She reports being more distressed about not being able to use her hands well. OT eval pending.              PORTER,DABNEY 12/17/2011, 1:32 PM

## 2011-12-18 ENCOUNTER — Inpatient Hospital Stay (HOSPITAL_COMMUNITY): Payer: BC Managed Care – PPO

## 2011-12-18 LAB — GLUCOSE, CAPILLARY: Glucose-Capillary: 171 mg/dL — ABNORMAL HIGH (ref 70–99)

## 2011-12-18 MED ORDER — NYSTATIN 100000 UNIT/GM EX CREA: TOPICAL_CREAM | Freq: Two times a day (BID) | CUTANEOUS | Status: DC

## 2011-12-18 NOTE — Discharge Summary (Signed)
NAMEBONNEY, BERRES NO.:  [id number]  MEDICAL RECORD NO.:  [id number]  LOCATION:  A303                          FACILITY:  [facility]  PHYSICIAN:  Oluwatobi Ruppe G. Renard Matter, MD   DATE OF BIRTH:  02/16/1946  DATE OF ADMISSION:  12/15/2011 DATE OF DISCHARGE:  07/28/2013LH                              DISCHARGE SUMMARY   The patient had a carotid Doppler ultrasound done today which showed mild-to-moderate stenosis more so on right than left, 50-69%.  The patient is being sent home on the following medications; isosorbide mononitrate 60 mg daily, alprazolam 0.5 mg at bedtime as needed, Lexapro 20 mg daily, Bystolic 2.5 mg daily, Synthroid 112 mcg daily, vitamin D3 2000 units daily at bedtime, potassium chloride in the form of K-Dur 20 mEq daily, insulin Lantus 36 units at bedtime, hydralazine 50 mg t.i.d., Nexium 40 mg daily, furosemide 20 mg daily, fish oil omega-3 fatty acid 1000 mg twice a day, and Humalog insulin 10-30 units according to sliding scale 3 times a day.  The patient was stable and improved at the time of her discharge.     Varnell Donate G. Renard Matter, MD     AGM/MEDQ  D:  12/18/2011  T:  12/18/2011  Job:  [id number]

## 2011-12-18 NOTE — Discharge Summary (Signed)
NAMEAPREL, EGELHOFF NO.:  [id number]  MEDICAL RECORD NO.:  [id number]  LOCATION:  A303                          FACILITY:  [facility]  PHYSICIAN:  Weda Baumgarner G. Renard Matter, MD   DATE OF BIRTH:  1945-08-26  DATE OF ADMISSION:  12/15/2011 DATE OF DISCHARGE:  07/28/2013LH                              DISCHARGE SUMMARY   This patient was admitted on December 15, 2011, discharged December 18, 2011, 3 days hospitalization.  DIAGNOSES:  Subacute infarct in right cerebellum with numbness of face and difficulty use of left hand, renal artery stenosis, coronary artery disease with stenting, prior myocardial infarction, prior treatment of streptococcal endocarditis, hypertension, diabetes mellitus, diastolic dysfunction, and hypothyroidism.  The patient improved at the time of her discharge.  This patient presented to the emergency department with weakness on the left facial muscles and numbness of left hand.  This was felt to be a new onset stroke.  A CT of the head showed an old right cerebellar infarct, diffuse small vessel ischemic changes and some changes in the infarction to indicate there was a new finding.  The patient has had multiple other medical problems including streptococcal endocarditis for which she has been treated in [place].  She does have a history of hypertension, diabetes, diastolic dysfunction, hypothyroidism, coronary artery disease with previous stenting and prior MI and CAD.  The patient was admitted with these symptoms.  LABORATORY DATA:  Dictation ended at this point.     Myrtha Tonkovich G. Renard Matter, MD     AGM/MEDQ  D:  12/18/2011  T:  12/18/2011  Job:  [id number]

## 2011-12-18 NOTE — Discharge Summary (Signed)
[name], [name] NO.:  [id number]  MEDICAL RECORD NO.:  [id number]  LOCATION:  A303                          FACILITY:  [facility]  PHYSICIAN:  Valmai Vandenberghe G. Shakiyla Kook, [name]   DATE OF BIRTH:  [birth year]  DATE OF ADMISSION:  12/15/2011 DATE OF DISCHARGE:  07/28/2013LH                              DISCHARGE SUMMARY   ADDENDUM  LABORATORY DATA:  CBC:  WBC is 7100 with hemoglobin of 11.6, hematocrit 34.6.  UA essentially normal.  Basic metabolic panel; sodium 135, potassium 3.7, chloride 99, CO2 24, glucose 282, BUN 29, creatinine 1.35, calcium 10.3, GFR 41, troponin less than 0.30, hemoglobin A1c 6.8. Urine drugs screen essentially negative.  CT of the head without contrast of right interval cerebral infarct appearing at least subacute. No evidence of acute cortical infarct or hemorrhage, diffuse small vessel ischemic changes noted, no right mastoid effusion.  Carotid Doppler ultrasound pending.  A 2D echo did not show evidence of embolic source.  HOSPITAL COURSE:  The patient at the time of her admission was placed on heart healthy diabetic diet.  She was continued on the following medications; escitalopram 20 mg daily, hydralazine 50 mg every 8 hours, insulin aspart 2 units subcutaneously at bedtime and also 2 units t.i.d., insulin glargine 36 units at bedtime, isosorbide mononitrate 60 mg daily, levothyroxine 112 mcg daily, nebivolol 2.5 mg daily, pantoprazole 80 mg daily, omega-3 acid ethyl esters 2 mg daily, Mycostatin cream was added to regimen for yeast infection.  The patient remained on IV fluids, normal saline with KCl 20 mEq/L at 50 mL/h.  She had be given alprazolam 0.5 mg p.r.n., acetaminophen 650 mg q.4 h. p.r.n.  The patient was seen in consultation by Neurology, Dr. Gerilyn Pilgrim. He reiterated that she had previous gastrointestinal bleed associated with polypectomy and previously been on aspirin and Plavix in combination.  Both of which have been discontinued.   He also felt that she should be placed back on low-dose aspirin.  She did have severe calcification of mitral valve which showed on 2D echo and ejection fraction of 60%.  He did not feel that she needed an MRI, but did recommend to continue and obtain carotid Doppler ultrasound which is pending.  The patient remained stable during her hospital stay.  She did have impairment of fine finger movements of left hand, proximal muscle weakness left lower leg and numbness of left side of the face.  The patient was able to ambulate in the room.  It was felt that after 3 days of hospitalization, she could be discharged home.     [name] G. Renard Matter, [name]     AGM/MEDQ  D:  12/18/2011  T:  12/18/2011  Job:  [id number]

## 2011-12-18 NOTE — Progress Notes (Signed)
Patient received discharge instructions along with follow up appointments. Patient verbalized understanding of all instructions. Patient was escorted by staff via wheelchair to vehicle. Patient discharged to home in stable condition. 

## 2011-12-20 ENCOUNTER — Ambulatory Visit (INDEPENDENT_AMBULATORY_CARE_PROVIDER_SITE_OTHER): Payer: BC Managed Care – PPO | Admitting: Internal Medicine

## 2011-12-20 ENCOUNTER — Encounter (HOSPITAL_COMMUNITY): Payer: Self-pay | Admitting: *Deleted

## 2011-12-20 ENCOUNTER — Encounter: Payer: Self-pay | Admitting: Internal Medicine

## 2011-12-20 VITALS — BP 154/65 | HR 65 | Temp 98.1°F | Ht 66.0 in | Wt 177.0 lb

## 2011-12-20 DIAGNOSIS — B20 Human immunodeficiency virus [HIV] disease: Secondary | ICD-10-CM

## 2011-12-20 DIAGNOSIS — B955 Unspecified streptococcus as the cause of diseases classified elsewhere: Secondary | ICD-10-CM

## 2011-12-20 DIAGNOSIS — A491 Streptococcal infection, unspecified site: Secondary | ICD-10-CM

## 2011-12-20 DIAGNOSIS — I330 Acute and subacute infective endocarditis: Secondary | ICD-10-CM

## 2011-12-20 NOTE — Progress Notes (Signed)
Patient ID: [name], female   DOB: 1945/12/22, 65 y.o.   MRN: [id number]    [facility]  Patient Active Problem List  Diagnosis  . HYPOTHYROIDISM  . HYPERLIPIDEMIA, hx of statin induced myosistis  . OTHER SPECIFIED IDIOPATHIC PERIPHERAL NEUROPATHY  . HYPERTENSIVE CARDIOVASCULAR DISEASE  . COPD UNSPECIFIED  . VERTIGO  . Obstructive sleep apnea on nocturnal CPAP  . Leukocytosis  . Acute renal failure  . Elevated troponin  . Pulmonary HTN-severe- due to diastolic dysfunction  . 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  . Nausea with vomiting  . Diarrhea  . Volume depletion, gastrointestinal loss  . Normocytic anemia  . Hyperglycemia  . 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 Recurrent otitis media  . Chest pain  . Diabetes mellitus  . RAS (renal artery stenosis), s/p renal artery PTA X 2  . Esophageal reflux  . Special screening for malignant neoplasms, colon  . Normal coronary angiogram, 10/20/11  Nl LVF  . Benign neoplasm of colon  . Hemorrhage of rectum and anus, polypectomy 10/12/11  . Acute posthemorrhagic anemia  . Chest pain  . 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 Chronic renal insufficiency, stage III (moderate)  . Colon ulcer related to recent polypectomy procedure  . Syncope  . Post-polypectomy bleeding  . acute CVA (cerebral vascular accident)  . Intertriginous candidiasis    Patient's Medications  New Prescriptions   No medications on file  Previous Medications   ALPRAZOLAM (XANAX) 0.5 MG TABLET    Take 0.5 mg by mouth at bedtime as needed. For anxiety   CHOLECALCIFEROL (VITAMIN D3) 2000 UNITS TABS    Take 1 tablet by mouth at bedtime.     ESCITALOPRAM (LEXAPRO) 20 MG TABLET    Take 20 mg by mouth daily.     ESOMEPRAZOLE (NEXIUM) 40 MG CAPSULE    Take 40 mg by mouth daily.   FISH OIL-OMEGA-3 FATTY ACIDS 1000 MG CAPSULE    Take 1 g by mouth 2 (two) times daily.   FUROSEMIDE (LASIX) 20  MG TABLET    Take 20 mg by mouth daily.   HYDRALAZINE (APRESOLINE) 50 MG TABLET    Take 50 mg by mouth 3 (three) times daily.   INSULIN GLARGINE (LANTUS) 100 UNIT/ML INJECTION    Inject 36 Units into the skin at bedtime.   INSULIN LISPRO (HUMALOG) 100 UNIT/ML INJECTION    Inject 10-30 Units into the skin 3 (three) times daily before meals. Uses according to a sliding scale at home.   ISOSORBIDE MONONITRATE (IMDUR) 60 MG 24 HR TABLET    Take 60 mg by mouth daily.     LEVOTHYROXINE (SYNTHROID, LEVOTHROID) 112 MCG TABLET    Take 112 mcg by mouth daily.     NEBIVOLOL (BYSTOLIC) 2.5 MG TABLET    Take 2.5 mg by mouth daily.     NYSTATIN CREAM (MYCOSTATIN)    Apply topically 2 (two) times daily.   POTASSIUM CHLORIDE SA (K-DUR,KLOR-CON) 20 MEQ TABLET    Take 1 tablet (20 mEq total) by mouth daily.  Modified Medications   No medications on file  Discontinued Medications   No medications on file    Subjective: [name] is in for her routine followup visit. She has now been off of antibiotics since June 13 after completing one month of therapy for strep bovis endocarditis. She was rehospitalized from July 25-28 with a right cerebellar ischemic stroke. Her transthoracic echocardiogram at that time did not show  any vegetations as had been seen on the mitral valve in May. She has not had any fever, chills, or sweats.  Objective: Temp: 98.1 F (36.7 C) (07/30 1036) Temp src: Oral (07/30 1036) BP: 154/65 mmHg (07/30 1036) Pulse Rate: 65  (07/30 1036)  General: She is talkative and in good spirits Skin: No rash or splinter hemorrhages  Lungs: Clear Cor: Regular S1 and S2 with an early 1/6 systolic murmur Extremities: She has some residual weakness of her left hand   Assessment: I believe that her streptococcal endocarditis his probably cured and do not think that it's likely that she suffered an embolic stroke recently. However I'll repeat her blood cultures today off of antibiotics to be  sure.  Plan: 1. Observe off of antibiotics 2. Blood cultures x2   Cliffton Asters, MD [facility] [phone] pager   [phone] cell 12/20/2011, 11:26 AM

## 2011-12-26 LAB — CULTURE, BLOOD (SINGLE)
Organism ID, Bacteria: NO GROWTH
Organism ID, Bacteria: NO GROWTH

## 2011-12-27 ENCOUNTER — Ambulatory Visit (HOSPITAL_COMMUNITY): Payer: BC Managed Care – PPO | Admitting: Physical Therapy

## 2011-12-27 ENCOUNTER — Telehealth: Payer: Self-pay | Admitting: *Deleted

## 2011-12-27 NOTE — Telephone Encounter (Signed)
Pt verbalized understanding that the blood culture results were negative.  No further treatment at this time.

## 2012-02-23 ENCOUNTER — Other Ambulatory Visit (HOSPITAL_COMMUNITY): Payer: Self-pay | Admitting: Family Medicine

## 2012-02-27 ENCOUNTER — Ambulatory Visit (HOSPITAL_COMMUNITY)
Admission: RE | Admit: 2012-02-27 | Discharge: 2012-02-27 | Disposition: A | Discharge status: Home / Self Care | Payer: BC Managed Care – PPO | Source: Ambulatory Visit | Attending: Family Medicine | Admitting: Family Medicine

## 2012-02-27 DIAGNOSIS — M79609 Pain in unspecified limb: Secondary | ICD-10-CM | POA: Insufficient documentation

## 2012-03-19 ENCOUNTER — Encounter (HOSPITAL_COMMUNITY): Payer: Self-pay | Admitting: Pharmacy Technician

## 2012-03-23 ENCOUNTER — Encounter (HOSPITAL_COMMUNITY)
Admission: RE | Admit: 2012-03-23 | Discharge: 2012-03-23 | Disposition: A | Discharge status: Home / Self Care | Payer: BC Managed Care – PPO | Source: Ambulatory Visit | Attending: Ophthalmology | Admitting: Ophthalmology

## 2012-03-23 ENCOUNTER — Encounter (HOSPITAL_COMMUNITY): Payer: Self-pay

## 2012-03-23 HISTORY — DX: Cerebral infarction, unspecified: I63.9

## 2012-03-23 HISTORY — DX: Unspecified osteoarthritis, unspecified site: M19.90

## 2012-03-23 LAB — BASIC METABOLIC PANEL
CO2: 23 mEq/L (ref 19–32)
Calcium: 9.6 mg/dL (ref 8.4–10.5)
GFR calc Af Amer: 45 mL/min — ABNORMAL LOW (ref >90)
Sodium: 136 mEq/L (ref 135–145)

## 2012-03-23 LAB — HEMOGLOBIN AND HEMATOCRIT, BLOOD
HCT: 35.9 % — ABNORMAL LOW (ref 36.0–46.0)
Hemoglobin: 12.2 g/dL (ref 12.0–15.0)

## 2012-03-23 NOTE — Patient Instructions (Signed)
20 [name]  03/23/2012   Your procedure is scheduled on:  03/29/12  Report to [facility] at 1000 AM.  Call this number if you have problems the morning of surgery: [phone]   Remember:   Do not eat food:After Midnight.  May have clear liquids:until Midnight .  Clear liquids include soda, tea, black coffee, apple or grape juice, broth.  Take these medicines the morning of surgery with A SIP OF WATER: xanax, lexapro, hydralazine, bystolic, prilosec, synthroid, imdur.  NO DIABETIC MEDICINE THE DAY OF SURGERY.  Take only 33units of Lantus Wednesday night.   Do not wear jewelry, make-up or nail polish.  Do not wear lotions, powders, or perfumes. You may wear deodorant.  Do not shave 48 hours prior to surgery. Men may shave face and neck.  Do not bring valuables to the hospital.  Contacts, dentures or bridgework may not be worn into surgery.  Leave suitcase in the car. After surgery it may be brought to your room.  For patients admitted to the hospital, checkout time is 11:00 AM the day of discharge.   Patients discharged the day of surgery will not be allowed to drive home.  Name and phone number of your driver: family  Special Instructions: N/A   Please read over the following fact sheets that you were given: Pain Booklet, Anesthesia Post-op Instructions and Care and Recovery After Surgery   PATIENT INSTRUCTIONS POST-ANESTHESIA  IMMEDIATELY FOLLOWING SURGERY:  Do not drive or operate machinery for the first twenty four hours after surgery.  Do not make any important decisions for twenty four hours after surgery or while taking narcotic pain medications or sedatives.  If you develop intractable nausea and vomiting or a severe headache please notify your doctor immediately.  FOLLOW-UP:  Please make an appointment with your surgeon as instructed. You do not need to follow up with anesthesia unless specifically instructed to do so.  WOUND CARE INSTRUCTIONS (if applicable):  Keep a  dry clean dressing on the anesthesia/puncture wound site if there is drainage.  Once the wound has quit draining you may leave it open to air.  Generally you should leave the bandage intact for twenty four hours unless there is drainage.  If the epidural site drains for more than 36-48 hours please call the anesthesia department.  QUESTIONS?:  Please feel free to call your physician or the hospital operator if you have any questions, and they will be happy to assist you.      Cataract Surgery  A cataract is a clouding of the lens of the eye. When a lens becomes cloudy, vision is reduced based on the degree and nature of the clouding. Surgery may be needed to improve vision. Surgery removes the cloudy lens and usually replaces it with a substitute lens (intraocular lens, IOL). LET YOUR EYE DOCTOR KNOW ABOUT:  Allergies to food or medicine.  Medicines taken including herbs, eyedrops, over-the-counter medicines, and creams.  Use of steroids (by mouth or creams).  Previous problems with anesthetics or numbing medicine.  History of bleeding problems or blood clots.  Previous surgery.  Other health problems, including diabetes and kidney problems.  Possibility of pregnancy, if this applies. RISKS AND COMPLICATIONS  Infection.  Inflammation of the eyeball (endophthalmitis) that can spread to both eyes (sympathetic ophthalmia).  Poor wound healing.  If an IOL is inserted, it can later fall out of proper position. This is very uncommon.  Clouding of the part of your eye that holds  an IOL in place. This is called an "after-cataract." These are uncommon, but easily treated. BEFORE THE PROCEDURE  Do not eat or drink anything except small amounts of water for 8 to 12 before your surgery, or as directed by your caregiver.  Unless you are told otherwise, continue any eyedrops you have been prescribed.  Talk to your primary caregiver about all other medicines that you take (both  prescription and non-prescription). In some cases, you may need to stop or change medicines near the time of your surgery. This is most important if you are taking blood-thinning medicine.Do not stop medicines unless you are told to do so.  Arrange for someone to drive you to and from the procedure.  Do not put contact lenses in either eye on the day of your surgery. PROCEDURE There is more than one method for safely removing a cataract. Your doctor can explain the differences and help determine which is best for you. Phacoemulsification surgery is the most common form of cataract surgery.  An injection is given behind the eye or eyedrops are given to make this a painless procedure.  A small cut (incision) is made on the edge of the clear, dome-shaped surface that covers the front of the eye (cornea).  A tiny probe is painlessly inserted into the eye. This device gives off ultrasound waves that soften and break up the cloudy center of the lens. This makes it easier for the cloudy lens to be removed by suction.  An IOL may be implanted.  The normal lens of the eye is covered by a clear capsule. Part of that capsule is intentionally left in the eye to support the IOL.  Your surgeon may or may not use stitches to close the incision. There are other forms of cataract surgery that require a larger incision and stiches to close the eye. This approach is taken in cases where the doctor feels that the cataract cannot be easily removed using phacoemulsification. AFTER THE PROCEDURE  When an IOL is implanted, it does not need care. It becomes a permanent part of your eye and cannot be seen or felt.  Your doctor will schedule follow-up exams to check on your progress.  Review your other medicines with your doctor to see which can be resumed after surgery.  Use eyedrops or take medicine as prescribed by your doctor. Document Released: 04/28/2011 Document Revised: 08/01/2011 Document Reviewed:  04/28/2011 [facility] Patient Information 2013 [place], [place].

## 2012-03-28 MED ORDER — LIDOCAINE HCL (PF) 1 % IJ SOLN
INTRAMUSCULAR | Status: AC
  Filled 2012-03-28: qty 2

## 2012-03-28 MED ORDER — PHENYLEPHRINE HCL 2.5 % OP SOLN
OPHTHALMIC | Status: AC
  Filled 2012-03-28: qty 2

## 2012-03-28 MED ORDER — CYCLOPENTOLATE-PHENYLEPHRINE 0.2-1 % OP SOLN
OPHTHALMIC | Status: AC
  Filled 2012-03-28: qty 2

## 2012-03-28 MED ORDER — LIDOCAINE HCL 3.5 % OP GEL
OPHTHALMIC | Status: AC
  Filled 2012-03-28: qty 5

## 2012-03-28 MED ORDER — NEOMYCIN-POLYMYXIN-DEXAMETH 3.5-10000-0.1 OP OINT
TOPICAL_OINTMENT | OPHTHALMIC | Status: AC
  Filled 2012-03-28: qty 3.5

## 2012-03-28 MED ORDER — TETRACAINE HCL 0.5 % OP SOLN
OPHTHALMIC | Status: AC
  Filled 2012-03-28: qty 2

## 2012-03-29 ENCOUNTER — Encounter (HOSPITAL_COMMUNITY)
Admission: RE | Disposition: A | Discharge status: Home / Self Care | Payer: Self-pay | Source: Ambulatory Visit | Attending: Ophthalmology

## 2012-03-29 ENCOUNTER — Ambulatory Visit (HOSPITAL_COMMUNITY)
Admission: RE | Admit: 2012-03-29 | Discharge: 2012-03-29 | Disposition: A | Discharge status: Home / Self Care | Payer: BC Managed Care – PPO | Source: Ambulatory Visit | Attending: Ophthalmology | Admitting: Ophthalmology

## 2012-03-29 ENCOUNTER — Encounter (HOSPITAL_COMMUNITY): Payer: Self-pay | Admitting: *Deleted

## 2012-03-29 ENCOUNTER — Encounter (HOSPITAL_COMMUNITY): Payer: Self-pay | Admitting: Anesthesiology

## 2012-03-29 ENCOUNTER — Ambulatory Visit (HOSPITAL_COMMUNITY): Payer: BC Managed Care – PPO | Admitting: Anesthesiology

## 2012-03-29 DIAGNOSIS — I10 Essential (primary) hypertension: Secondary | ICD-10-CM | POA: Insufficient documentation

## 2012-03-29 DIAGNOSIS — Z794 Long term (current) use of insulin: Secondary | ICD-10-CM | POA: Insufficient documentation

## 2012-03-29 DIAGNOSIS — J4489 Other specified chronic obstructive pulmonary disease: Secondary | ICD-10-CM | POA: Insufficient documentation

## 2012-03-29 DIAGNOSIS — Z01812 Encounter for preprocedural laboratory examination: Secondary | ICD-10-CM | POA: Insufficient documentation

## 2012-03-29 DIAGNOSIS — E119 Type 2 diabetes mellitus without complications: Secondary | ICD-10-CM | POA: Insufficient documentation

## 2012-03-29 DIAGNOSIS — J449 Chronic obstructive pulmonary disease, unspecified: Secondary | ICD-10-CM | POA: Insufficient documentation

## 2012-03-29 DIAGNOSIS — H2589 Other age-related cataract: Secondary | ICD-10-CM | POA: Insufficient documentation

## 2012-03-29 HISTORY — PX: CATARACT EXTRACTION W/PHACO: SHX586

## 2012-03-29 SURGERY — PHACOEMULSIFICATION, CATARACT, WITH IOL INSERTION
Anesthesia: Monitor Anesthesia Care | Site: Eye | Laterality: Right | Wound class: Clean

## 2012-03-29 MED ORDER — LACTATED RINGERS IV SOLN
INTRAVENOUS | Status: DC
  Administered 2012-03-29: 1000 mL via INTRAVENOUS

## 2012-03-29 MED ORDER — FENTANYL CITRATE 0.05 MG/ML IJ SOLN: 25.0000 ug | INTRAMUSCULAR | Status: DC | PRN

## 2012-03-29 MED ORDER — STERILE WATER FOR IRRIGATION IR SOLN
Status: DC | PRN
  Administered 2012-03-29: 250 mL

## 2012-03-29 MED ORDER — CYCLOPENTOLATE-PHENYLEPHRINE 0.2-1 % OP SOLN
1.0000 [drp] | OPHTHALMIC | Status: AC
  Administered 2012-03-29 (×3): 1 [drp] via OPHTHALMIC

## 2012-03-29 MED ORDER — POVIDONE-IODINE 5 % OP SOLN
OPHTHALMIC | Status: DC | PRN
  Administered 2012-03-29: 1 via OPHTHALMIC

## 2012-03-29 MED ORDER — ONDANSETRON HCL 4 MG/2ML IJ SOLN
INTRAMUSCULAR | Status: AC
  Filled 2012-03-29: qty 2

## 2012-03-29 MED ORDER — MIDAZOLAM HCL 2 MG/2ML IJ SOLN
1.0000 mg | INTRAMUSCULAR | Status: DC | PRN
  Administered 2012-03-29 (×2): 2 mg via INTRAVENOUS

## 2012-03-29 MED ORDER — TETRACAINE HCL 0.5 % OP SOLN
1.0000 [drp] | OPHTHALMIC | Status: AC
  Administered 2012-03-29 (×3): 1 [drp] via OPHTHALMIC

## 2012-03-29 MED ORDER — MIDAZOLAM HCL 5 MG/5ML IJ SOLN
INTRAMUSCULAR | Status: DC | PRN
  Administered 2012-03-29 (×2): 2 mg via INTRAVENOUS

## 2012-03-29 MED ORDER — PROVISC 10 MG/ML IO SOLN
INTRAOCULAR | Status: DC | PRN
  Administered 2012-03-29: 8.5 mg via INTRAOCULAR

## 2012-03-29 MED ORDER — NEOMYCIN-POLYMYXIN-DEXAMETH 0.1 % OP OINT
TOPICAL_OINTMENT | OPHTHALMIC | Status: DC | PRN
  Administered 2012-03-29: 1

## 2012-03-29 MED ORDER — MIDAZOLAM HCL 2 MG/2ML IJ SOLN
INTRAMUSCULAR | Status: AC
  Filled 2012-03-29: qty 2

## 2012-03-29 MED ORDER — ONDANSETRON HCL 4 MG/2ML IJ SOLN
4.0000 mg | Freq: Once | INTRAMUSCULAR | Status: AC
  Administered 2012-03-29: 4 mg via INTRAVENOUS

## 2012-03-29 MED ORDER — EPINEPHRINE HCL 1 MG/ML IJ SOLN
INTRAMUSCULAR | Status: AC
  Filled 2012-03-29: qty 1

## 2012-03-29 MED ORDER — PHENYLEPHRINE HCL 2.5 % OP SOLN
1.0000 [drp] | OPHTHALMIC | Status: AC
  Administered 2012-03-29 (×3): 1 [drp] via OPHTHALMIC

## 2012-03-29 MED ORDER — EPINEPHRINE HCL 1 MG/ML IJ SOLN
INTRAOCULAR | Status: DC | PRN
  Administered 2012-03-29: 12:00:00

## 2012-03-29 MED ORDER — BSS IO SOLN
INTRAOCULAR | Status: DC | PRN
  Administered 2012-03-29: 15 mL via INTRAOCULAR

## 2012-03-29 MED ORDER — ONDANSETRON HCL 4 MG/2ML IJ SOLN: 4.0000 mg | Freq: Once | INTRAMUSCULAR | Status: DC | PRN

## 2012-03-29 SURGICAL SUPPLY — 33 items
CAPSULAR TENSION RING-AMO (OPHTHALMIC RELATED) IMPLANT
CLOTH BEACON ORANGE TIMEOUT ST (SAFETY) ×1 IMPLANT
EYE SHIELD UNIVERSAL CLEAR (GAUZE/BANDAGES/DRESSINGS) ×1 IMPLANT
GLOVE BIO SURGEON STRL SZ 6.5 (GLOVE) IMPLANT
GLOVE BIOGEL PI IND STRL 6.5 (GLOVE) IMPLANT
GLOVE BIOGEL PI IND STRL 7.0 (GLOVE) IMPLANT
GLOVE BIOGEL PI IND STRL 7.5 (GLOVE) IMPLANT
GLOVE BIOGEL PI INDICATOR 6.5 (GLOVE) ×2
GLOVE BIOGEL PI INDICATOR 7.0 (GLOVE)
GLOVE BIOGEL PI INDICATOR 7.5 (GLOVE)
GLOVE ECLIPSE 6.5 STRL STRAW (GLOVE) IMPLANT
GLOVE ECLIPSE 7.0 STRL STRAW (GLOVE) IMPLANT
GLOVE ECLIPSE 7.5 STRL STRAW (GLOVE) IMPLANT
GLOVE EXAM NITRILE LRG STRL (GLOVE) IMPLANT
GLOVE EXAM NITRILE MD LF STRL (GLOVE) ×1 IMPLANT
GLOVE SKINSENSE NS SZ6.5 (GLOVE)
GLOVE SKINSENSE NS SZ7.0 (GLOVE)
GLOVE SKINSENSE STRL SZ6.5 (GLOVE) IMPLANT
GLOVE SKINSENSE STRL SZ7.0 (GLOVE) IMPLANT
GOWN STRL REIN XL XLG (GOWN DISPOSABLE) ×1 IMPLANT
KIT VITRECTOMY (OPHTHALMIC RELATED) IMPLANT
PAD ARMBOARD 7.5X6 YLW CONV (MISCELLANEOUS) ×1 IMPLANT
PROC W NO LENS (INTRAOCULAR LENS)
PROC W SPEC LENS (INTRAOCULAR LENS)
PROCESS W NO LENS (INTRAOCULAR LENS) IMPLANT
PROCESS W SPEC LENS (INTRAOCULAR LENS) IMPLANT
RING MALYGIN (MISCELLANEOUS) IMPLANT
SIGHTPATH CAT PROC W REG LENS (Ophthalmic Related) ×2 IMPLANT
SYR TB 1ML LL NO SAFETY (SYRINGE) ×1 IMPLANT
TAPE SURG TRANSPORE 1 IN (GAUZE/BANDAGES/DRESSINGS) IMPLANT
TAPE SURGICAL TRANSPORE 1 IN (GAUZE/BANDAGES/DRESSINGS) ×1
VISCOELASTIC ADDITIONAL (OPHTHALMIC RELATED) IMPLANT
WATER STERILE IRR 250ML POUR (IV SOLUTION) ×1 IMPLANT

## 2012-03-29 NOTE — Transfer of Care (Signed)
Immediate Anesthesia Transfer of Care Note  Patient: [name]  Procedure(s) Performed: Procedure(s) (LRB) with comments: CATARACT EXTRACTION PHACO AND INTRAOCULAR LENS PLACEMENT (IOC) (Right) - CDE: 16.87  Patient Location: PACU and Short Stay  Anesthesia Type:MAC  Level of Consciousness: awake, alert , oriented and patient cooperative  Airway & Oxygen Therapy: Patient Spontanous Breathing  Post-op Assessment: Report given to PACU RN and Post -op Vital signs reviewed and stable  Post vital signs: Reviewed and stable  Complications: No apparent anesthesia complications

## 2012-03-29 NOTE — Brief Op Note (Signed)
Pre-Op Dx: Cataract OD Post-Op Dx: Cataract OD Surgeon: Alejo Beamer Anesthesia: Topical with MAC Surgery: Cataract Extraction with Intraocular lens Implant OD Implant: B&L enVista Specimen: None Complications: None 

## 2012-03-29 NOTE — H&P (Signed)
I have reviewed the H&P, the patient was re-examined, and I have identified no interval changes in medical condition and plan of care since the history and physical of record  

## 2012-03-29 NOTE — Anesthesia Postprocedure Evaluation (Signed)
  Anesthesia Post-op Note  Patient: [name]  Procedure(s) Performed: Procedure(s) (LRB) with comments: CATARACT EXTRACTION PHACO AND INTRAOCULAR LENS PLACEMENT (IOC) (Right) - CDE: 16.87  Patient Location: PACU and Short Stay  Anesthesia Type:MAC  Level of Consciousness: awake, alert , oriented and patient cooperative  Airway and Oxygen Therapy: Patient Spontanous Breathing  Post-op Pain: none  Post-op Assessment: Post-op Vital signs reviewed, Patient's Cardiovascular Status Stable, Respiratory Function Stable, Patent Airway, No signs of Nausea or vomiting and Pain level controlled  Post-op Vital Signs: Reviewed and stable  Complications: No apparent anesthesia complications

## 2012-03-29 NOTE — Anesthesia Preprocedure Evaluation (Signed)
Anesthesia Evaluation  Patient identified by MRN, date of birth, ID band Patient awake    Reviewed: Allergy & Precautions, H&P , NPO status , Patient's Chart, lab work & pertinent test results  History of Anesthesia Complications (+) PONV  Airway Mallampati: III      Dental  (+) Teeth Intact   Pulmonary shortness of breath, sleep apnea and Continuous Positive Airway Pressure Ventilation , COPD   Pulmonary exam normal       Cardiovascular hypertension, Pt. on medications + angina with exertion + CAD, + Past MI, + Peripheral Vascular Disease and +CHF Rhythm:Regular Rate:Normal     Neuro/Psych PSYCHIATRIC DISORDERS Anxiety Depression  Neuromuscular disease CVA    GI/Hepatic PUD,   Endo/Other  diabetes, Well Controlled, Type 2, Insulin DependentHypothyroidism   Renal/GU Renal disease     Musculoskeletal   Abdominal   Peds  Hematology   Anesthesia Other Findings   Reproductive/Obstetrics                           Anesthesia Physical Anesthesia Plan  ASA: III  Anesthesia Plan: MAC   Post-op Pain Management:    Induction: Intravenous  Airway Management Planned: Nasal Cannula  Additional Equipment:   Intra-op Plan:   Post-operative Plan:   Informed Consent: I have reviewed the patients History and Physical, chart, labs and discussed the procedure including the risks, benefits and alternatives for the proposed anesthesia with the patient or authorized representative who has indicated his/her understanding and acceptance.     Plan Discussed with:   Anesthesia Plan Comments:         Anesthesia Quick Evaluation

## 2012-03-30 ENCOUNTER — Encounter: Payer: Self-pay | Admitting: Internal Medicine

## 2012-03-30 NOTE — Op Note (Signed)
NAMERHYSE, BILES NO.:  [id number]  MEDICAL RECORD NO.:  [id number]  LOCATION:  [facility]PO                          FACILITY:  [facility]  PHYSICIAN:  Susanne Greenhouse, MD       DATE OF BIRTH:  Mar 12, 1946  DATE OF PROCEDURE:  03/29/2012 DATE OF DISCHARGE:  03/29/2012                              OPERATIVE REPORT   PREOPERATIVE DIAGNOSIS:  Combined cataract, right eye, diagnosis code 366.19.  POSTOPERATIVE DIAGNOSIS:  Combined cataract, right eye, diagnosis code 366.19.  OPERATION PERFORMED:  Phacoemulsification with posterior chamber intraocular lens implantation, right eye.  SURGEON:  Susanne Greenhouse, MD  ANESTHESIA:  Topical with monitored anesthesia care and IV sedation.  OPERATIVE SUMMARY:  In the preoperative area, dilating drops were placed into the right eye.  The patient was then brought into the operating room where she was placed under general anesthesia.  The eye was then prepped and draped.  Beginning with a 75 blade, a paracentesis port was made at the surgeon's 2 o'clock position.  The anterior chamber was then filled with a 1% nonpreserved lidocaine solution with epinephrine.  This was followed by Viscoat to deepen the chamber.  A small fornix-based peritomy was performed superiorly.  Next, a single iris hook was placed through the limbus superiorly.  A 2.4-mm keratome blade was then used to make a clear corneal incision over the iris hook.  A bent cystotome needle and Utrata forceps were used to create a continuous tear capsulotomy.  Hydrodissection was performed using balanced salt solution on a fine cannula.  The lens nucleus was then removed using phacoemulsification in a quadrant cracking technique.  The cortical material was then removed with irrigation and aspiration.  The capsular bag and anterior chamber were refilled with Provisc.  The wound was widened to approximately 3 mm and a posterior chamber intraocular lens was placed into the capsular  bag without difficulty using an [employer] lens injecting system.  A single 10-0 nylon suture was then used to close the incision as well as stromal hydration.  The Provisc was removed from the anterior chamber and capsular bag with irrigation and aspiration.  At this point, the wounds were tested for leak, which were negative.  The anterior chamber remained deep and stable.  The patient tolerated the procedure well.  There were no operative complications, and she awoke from general anesthesia without problem.  No surgical specimens.  Prosthetic device used is a Bausch and Lomb posterior chamber lens, model enVista, model number MX60, power of 23.5, serial number is [id number].          ______________________________ Susanne Greenhouse, MD     KEH/MEDQ  D:  03/29/2012  T:  03/30/2012  Job:  [id number]

## 2012-04-02 ENCOUNTER — Encounter (HOSPITAL_COMMUNITY): Payer: Self-pay | Admitting: Ophthalmology

## 2012-04-05 ENCOUNTER — Encounter (HOSPITAL_COMMUNITY): Payer: Self-pay

## 2012-04-12 ENCOUNTER — Encounter (HOSPITAL_COMMUNITY)
Admission: RE | Admit: 2012-04-12 | Discharge: 2012-04-12 | Payer: BC Managed Care – PPO | Source: Ambulatory Visit | Admitting: Ophthalmology

## 2012-04-12 ENCOUNTER — Encounter (HOSPITAL_COMMUNITY): Payer: Self-pay

## 2012-04-13 MED ORDER — NEOMYCIN-POLYMYXIN-DEXAMETH 3.5-10000-0.1 OP OINT
TOPICAL_OINTMENT | OPHTHALMIC | Status: AC
  Filled 2012-04-13: qty 3.5

## 2012-04-13 MED ORDER — LIDOCAINE HCL 3.5 % OP GEL
OPHTHALMIC | Status: AC
Start: 2012-04-13 — End: 2012-04-13
  Filled 2012-04-13: qty 5

## 2012-04-13 MED ORDER — PHENYLEPHRINE HCL 2.5 % OP SOLN
OPHTHALMIC | Status: AC
  Filled 2012-04-13: qty 2

## 2012-04-13 MED ORDER — TETRACAINE HCL 0.5 % OP SOLN
OPHTHALMIC | Status: AC
  Filled 2012-04-13: qty 2

## 2012-04-13 MED ORDER — CYCLOPENTOLATE-PHENYLEPHRINE 0.2-1 % OP SOLN
OPHTHALMIC | Status: AC
  Filled 2012-04-13: qty 2

## 2012-04-13 MED ORDER — LIDOCAINE HCL (PF) 1 % IJ SOLN
INTRAMUSCULAR | Status: AC
  Filled 2012-04-13: qty 2

## 2012-04-16 ENCOUNTER — Encounter (HOSPITAL_COMMUNITY)
Admission: RE | Disposition: A | Discharge status: Home / Self Care | Payer: Self-pay | Source: Ambulatory Visit | Attending: Ophthalmology

## 2012-04-16 ENCOUNTER — Ambulatory Visit (HOSPITAL_COMMUNITY)
Admission: RE | Admit: 2012-04-16 | Discharge: 2012-04-16 | Disposition: A | Discharge status: Home / Self Care | Payer: BC Managed Care – PPO | Source: Ambulatory Visit | Attending: Ophthalmology | Admitting: Ophthalmology

## 2012-04-16 ENCOUNTER — Encounter (HOSPITAL_COMMUNITY): Payer: Self-pay | Admitting: Anesthesiology

## 2012-04-16 ENCOUNTER — Encounter (HOSPITAL_COMMUNITY): Payer: Self-pay | Admitting: *Deleted

## 2012-04-16 ENCOUNTER — Ambulatory Visit (HOSPITAL_COMMUNITY): Payer: BC Managed Care – PPO | Admitting: Anesthesiology

## 2012-04-16 DIAGNOSIS — I10 Essential (primary) hypertension: Secondary | ICD-10-CM | POA: Insufficient documentation

## 2012-04-16 DIAGNOSIS — H2589 Other age-related cataract: Secondary | ICD-10-CM | POA: Insufficient documentation

## 2012-04-16 DIAGNOSIS — E119 Type 2 diabetes mellitus without complications: Secondary | ICD-10-CM | POA: Insufficient documentation

## 2012-04-16 DIAGNOSIS — J4489 Other specified chronic obstructive pulmonary disease: Secondary | ICD-10-CM | POA: Insufficient documentation

## 2012-04-16 DIAGNOSIS — J449 Chronic obstructive pulmonary disease, unspecified: Secondary | ICD-10-CM | POA: Insufficient documentation

## 2012-04-16 HISTORY — PX: CATARACT EXTRACTION W/PHACO: SHX586

## 2012-04-16 LAB — GLUCOSE, CAPILLARY: Glucose-Capillary: 167 mg/dL — ABNORMAL HIGH (ref 70–99)

## 2012-04-16 SURGERY — PHACOEMULSIFICATION, CATARACT, WITH IOL INSERTION
Anesthesia: Monitor Anesthesia Care | Site: Eye | Laterality: Left | Wound class: Clean

## 2012-04-16 MED ORDER — EPINEPHRINE HCL 1 MG/ML IJ SOLN
INTRAOCULAR | Status: DC | PRN
  Administered 2012-04-16: 12:00:00

## 2012-04-16 MED ORDER — MIDAZOLAM HCL 2 MG/2ML IJ SOLN
1.0000 mg | INTRAMUSCULAR | Status: DC | PRN
  Administered 2012-04-16 (×2): 2 mg via INTRAVENOUS

## 2012-04-16 MED ORDER — NEOMYCIN-POLYMYXIN-DEXAMETH 0.1 % OP OINT
TOPICAL_OINTMENT | OPHTHALMIC | Status: DC | PRN
  Administered 2012-04-16: 1 via OPHTHALMIC

## 2012-04-16 MED ORDER — TETRACAINE HCL 0.5 % OP SOLN
1.0000 [drp] | OPHTHALMIC | Status: AC
  Administered 2012-04-16 (×3): 1 [drp] via OPHTHALMIC

## 2012-04-16 MED ORDER — MIDAZOLAM HCL 2 MG/2ML IJ SOLN
INTRAMUSCULAR | Status: AC
  Filled 2012-04-16: qty 2

## 2012-04-16 MED ORDER — PHENYLEPHRINE HCL 2.5 % OP SOLN
1.0000 [drp] | OPHTHALMIC | Status: AC
  Administered 2012-04-16 (×3): 1 [drp] via OPHTHALMIC

## 2012-04-16 MED ORDER — TETRACAINE HCL 0.5 % OP SOLN
OPHTHALMIC | Status: DC | PRN
  Administered 2012-04-16: 2 [drp] via OPHTHALMIC

## 2012-04-16 MED ORDER — PROVISC 10 MG/ML IO SOLN
INTRAOCULAR | Status: DC | PRN
  Administered 2012-04-16: 8.5 mg via INTRAOCULAR

## 2012-04-16 MED ORDER — POVIDONE-IODINE 5 % OP SOLN
OPHTHALMIC | Status: DC | PRN
  Administered 2012-04-16: 1 via OPHTHALMIC

## 2012-04-16 MED ORDER — LACTATED RINGERS IV SOLN
INTRAVENOUS | Status: DC | PRN
  Administered 2012-04-16: 12:00:00 via INTRAVENOUS
  Administered 2012-04-16: 1000 mL

## 2012-04-16 MED ORDER — EPINEPHRINE HCL 1 MG/ML IJ SOLN
INTRAMUSCULAR | Status: AC
  Filled 2012-04-16: qty 1

## 2012-04-16 MED ORDER — LACTATED RINGERS IV SOLN: INTRAVENOUS | Status: DC

## 2012-04-16 MED ORDER — BSS IO SOLN
INTRAOCULAR | Status: DC | PRN
  Administered 2012-04-16: 15 mL via INTRAOCULAR

## 2012-04-16 MED ORDER — ONDANSETRON HCL 4 MG/2ML IJ SOLN
4.0000 mg | Freq: Once | INTRAMUSCULAR | Status: AC
  Administered 2012-04-16: 4 mg via INTRAVENOUS

## 2012-04-16 MED ORDER — CYCLOPENTOLATE-PHENYLEPHRINE 0.2-1 % OP SOLN
1.0000 [drp] | OPHTHALMIC | Status: AC
  Administered 2012-04-16 (×3): 1 [drp] via OPHTHALMIC

## 2012-04-16 MED ORDER — ONDANSETRON HCL 4 MG/2ML IJ SOLN
INTRAMUSCULAR | Status: AC
  Filled 2012-04-16: qty 2

## 2012-04-16 SURGICAL SUPPLY — 32 items

## 2012-04-16 NOTE — Discharge Instructions (Signed)
[name]  04/16/2012     Instructions  1. Use medications as Instructed.  Shake well before use. Wait 5 minutes between drops.  {OPHTHALMIC ANTIBIOTICS:22167} 4 times a day x 1 week.  {OPHTHALMIC ANTI-INFLAMMATORY:22168} 2 times a day x 4 weeks.  {OPHTHALMIC STEROID:22169} 4 times a day - week 1   3 times a day - Week 2, 2 times a day- Week 3, 1 time a day - Week 4.  2. Do not rub the operative eye. Do not swim underwater for 2 weeks.  3. You may remove the clear shield and resume your normal activities the day after  Surgery. Your eyes may feel more comfortable if you wear dark glasses outside.  4. Call our office at [phone] if you have sudden change in vision, extreme redness or pain. Some fluctuation in vision is normal after surgery. If you have an emergency after hours, call Dr. Alto Denver at [phone].  5. It is important that you attend all of your follow-up appointments.        Follow-up:tomorrow with Gemma Payor, MD.   Dr. Lahoma Crocker: [phone]  Dr. Lita Mains: [phone]  Dr. Alto Denver: [phone]   If you find that you cannot contact your physician, but feel that your signs and   Symptoms warrant a physician's attention, call the Emergency Room at   [phone].   Othern/a.

## 2012-04-16 NOTE — Anesthesia Postprocedure Evaluation (Signed)
  Anesthesia Post-op Note  Patient: [name]  Procedure(s) Performed: Procedure(s) (LRB) with comments: CATARACT EXTRACTION PHACO AND INTRAOCULAR LENS PLACEMENT (IOC) (Left) - CDE:  14.98  Patient Location: Short Stay  Anesthesia Type:MAC  Level of Consciousness: awake, alert  and oriented  Airway and Oxygen Therapy: Patient Spontanous Breathing  Post-op Pain: none  Post-op Assessment: Post-op Vital signs reviewed, Patient's Cardiovascular Status Stable, Respiratory Function Stable, Patent Airway, No signs of Nausea or vomiting and Pain level controlled  Post-op Vital Signs: Reviewed and stable  Complications: No apparent anesthesia complications

## 2012-04-16 NOTE — Transfer of Care (Signed)
Immediate Anesthesia Transfer of Care Note  Patient: [name]  Procedure(s) Performed: Procedure(s) (LRB) with comments: CATARACT EXTRACTION PHACO AND INTRAOCULAR LENS PLACEMENT (IOC) (Left) - CDE:  14.98  Patient Location: Short Stay  Anesthesia Type:MAC  Level of Consciousness: awake, alert  and oriented  Airway & Oxygen Therapy: Patient Spontanous Breathing  Post-op Assessment: Report given to PACU RN  Post vital signs: Reviewed and stable  Complications: No apparent anesthesia complications

## 2012-04-16 NOTE — H&P (Signed)
I have reviewed the H&P, the patient was re-examined, and I have identified no interval changes in medical condition and plan of care since the history and physical of record  

## 2012-04-16 NOTE — Brief Op Note (Signed)
Pre-Op Dx: Cataract OS Post-Op Dx: Cataract OS Surgeon: Koni Kannan Anesthesia: Topical with MAC Surgery: Cataract Extraction with Intraocular lens Implant OS Implant: B&L enVista Specimen: None Complications: None 

## 2012-04-16 NOTE — Anesthesia Preprocedure Evaluation (Signed)
Anesthesia Evaluation  Patient identified by MRN, date of birth, ID band Patient awake    Reviewed: Allergy & Precautions, H&P , NPO status , Patient's Chart, lab work & pertinent test results  History of Anesthesia Complications (+) PONV  Airway Mallampati: III      Dental  (+) Teeth Intact   Pulmonary shortness of breath, sleep apnea and Continuous Positive Airway Pressure Ventilation , COPD   Pulmonary exam normal       Cardiovascular hypertension, Pt. on medications + angina with exertion + CAD, + Past MI, + Peripheral Vascular Disease and +CHF Rhythm:Regular Rate:Normal     Neuro/Psych PSYCHIATRIC DISORDERS Anxiety Depression  Neuromuscular disease CVA    GI/Hepatic PUD,   Endo/Other  diabetes, Well Controlled, Type 2, Insulin DependentHypothyroidism   Renal/GU Renal disease     Musculoskeletal   Abdominal   Peds  Hematology   Anesthesia Other Findings   Reproductive/Obstetrics                           Anesthesia Physical Anesthesia Plan  ASA: III  Anesthesia Plan: MAC   Post-op Pain Management:    Induction: Intravenous  Airway Management Planned: Nasal Cannula  Additional Equipment:   Intra-op Plan:   Post-operative Plan:   Informed Consent: I have reviewed the patients History and Physical, chart, labs and discussed the procedure including the risks, benefits and alternatives for the proposed anesthesia with the patient or authorized representative who has indicated his/her understanding and acceptance.     Plan Discussed with:   Anesthesia Plan Comments:         Anesthesia Quick Evaluation  

## 2012-04-16 NOTE — Preoperative (Signed)
Beta Blockers   Reason not to administer Beta Blockers:Not Applicable 

## 2012-04-17 NOTE — Op Note (Signed)
[name], [name] NO.:  [id number]  MEDICAL RECORD NO.:  [id number]  LOCATION:  [facility]                          FACILITY:  [facility]  PHYSICIAN:  Susanne Greenhouse, MD       DATE OF BIRTH:  February 21, 1946  DATE OF PROCEDURE:  04/16/2012 DATE OF DISCHARGE:  04/16/2012                              OPERATIVE REPORT   PREOPERATIVE DIAGNOSIS:  Combined cataract, left eye, diagnosis code 366.19.  POSTOPERATIVE DIAGNOSIS:  Combined cataract, left eye, diagnosis code 366.19.  OPERATION PERFORMED:  Phacoemulsification with posterior chamber intraocular lens implantation, left eye.  SURGEON:  Bonne Dolores. Alauna Hayden, MD  ANESTHESIA:  Topical with monitored anesthesia care and IV sedation.  OPERATIVE SUMMARY:  In the preoperative area, dilating drops were placed into the left eye.  The patient was then brought into the operating room where she was placed under general anesthesia.  The eye was then prepped and draped.  Beginning with a 75 blade, a paracentesis port was made at the surgeon's 2 o'clock position.  The anterior chamber was then filled with a 1% nonpreserved lidocaine solution with epinephrine.  This was followed by Viscoat to deepen the chamber.  A small fornix-based peritomy was performed superiorly.  Next, a single iris hook was placed through the limbus superiorly.  A 2.4-mm keratome blade was then used to make a clear corneal incision over the iris hook.  A bent cystotome needle and Utrata forceps were used to create a continuous tear capsulotomy.  Hydrodissection was performed using balanced salt solution on a fine cannula.  The lens nucleus was then removed using phacoemulsification in a quadrant cracking technique.  The cortical material was then removed with irrigation and aspiration.  The capsular bag and anterior chamber were refilled with Provisc.  The wound was widened to approximately 3 mm and a posterior chamber intraocular lens was placed into the capsular bag  without difficulty using an [employer] lens injecting system.  A single 10-0 nylon suture was then used to close the incision as well as stromal hydration.  The Provisc was removed from the anterior chamber and capsular bag with irrigation and aspiration.  At this point, the wounds were tested for leak, which were negative.  The anterior chamber remained deep and stable.  The patient tolerated the procedure well.  There were no operative complications, and she awoke from general anesthesia without problem.  There were no surgical specimens.  Prosthetic device used is Bausch and Lomb posterior chamber lens, model enVista, model number MX60, power of 24.0, serial number is [id number].          ______________________________ Susanne Greenhouse, MD     KEH/MEDQ  D:  04/16/2012  T:  04/17/2012  Job:  [id number]

## 2012-04-17 NOTE — Addendum Note (Signed)
Addendum  created 04/17/12 0900 by Franco Nones, CRNA   Modules edited:Charges VN

## 2012-04-18 ENCOUNTER — Encounter (HOSPITAL_COMMUNITY): Payer: Self-pay | Admitting: Ophthalmology

## 2012-06-20 ENCOUNTER — Other Ambulatory Visit (HOSPITAL_COMMUNITY): Payer: Self-pay | Admitting: Cardiovascular Disease

## 2012-06-20 DIAGNOSIS — I701 Atherosclerosis of renal artery: Secondary | ICD-10-CM

## 2012-07-02 ENCOUNTER — Ambulatory Visit (HOSPITAL_COMMUNITY)
Admission: RE | Admit: 2012-07-02 | Discharge: 2012-07-02 | Disposition: A | Discharge status: Home / Self Care | Payer: BC Managed Care – PPO | Source: Ambulatory Visit | Attending: Cardiovascular Disease | Admitting: Cardiovascular Disease

## 2012-07-02 DIAGNOSIS — I701 Atherosclerosis of renal artery: Secondary | ICD-10-CM | POA: Insufficient documentation

## 2012-07-02 NOTE — Progress Notes (Signed)
Renal Duplex Completed. [name] D  

## 2012-07-17 ENCOUNTER — Other Ambulatory Visit (HOSPITAL_COMMUNITY): Payer: Self-pay | Admitting: Family Medicine

## 2012-07-17 DIAGNOSIS — M7989 Other specified soft tissue disorders: Secondary | ICD-10-CM

## 2012-07-25 ENCOUNTER — Other Ambulatory Visit (HOSPITAL_COMMUNITY): Payer: Self-pay | Admitting: Family Medicine

## 2012-07-25 ENCOUNTER — Ambulatory Visit (HOSPITAL_COMMUNITY)
Admission: RE | Admit: 2012-07-25 | Discharge: 2012-07-25 | Disposition: A | Discharge status: Home / Self Care | Payer: BC Managed Care – PPO | Source: Ambulatory Visit | Attending: Family Medicine | Admitting: Family Medicine

## 2012-07-25 DIAGNOSIS — M7989 Other specified soft tissue disorders: Secondary | ICD-10-CM

## 2012-07-25 DIAGNOSIS — N644 Mastodynia: Secondary | ICD-10-CM | POA: Insufficient documentation

## 2012-10-13 ENCOUNTER — Emergency Department (HOSPITAL_COMMUNITY): Payer: BC Managed Care – PPO

## 2012-10-13 ENCOUNTER — Emergency Department (HOSPITAL_COMMUNITY)
Admission: EM | Admit: 2012-10-13 | Discharge: 2012-10-14 | Disposition: A | Discharge status: Home / Self Care | Payer: BC Managed Care – PPO | Attending: Emergency Medicine | Admitting: Emergency Medicine

## 2012-10-13 DIAGNOSIS — R109 Unspecified abdominal pain: Secondary | ICD-10-CM | POA: Insufficient documentation

## 2012-10-13 DIAGNOSIS — Z8739 Personal history of other diseases of the musculoskeletal system and connective tissue: Secondary | ICD-10-CM | POA: Insufficient documentation

## 2012-10-13 DIAGNOSIS — I509 Heart failure, unspecified: Secondary | ICD-10-CM | POA: Insufficient documentation

## 2012-10-13 DIAGNOSIS — N179 Acute kidney failure, unspecified: Secondary | ICD-10-CM | POA: Insufficient documentation

## 2012-10-13 DIAGNOSIS — Z794 Long term (current) use of insulin: Secondary | ICD-10-CM | POA: Insufficient documentation

## 2012-10-13 DIAGNOSIS — Z8709 Personal history of other diseases of the respiratory system: Secondary | ICD-10-CM | POA: Insufficient documentation

## 2012-10-13 DIAGNOSIS — E039 Hypothyroidism, unspecified: Secondary | ICD-10-CM | POA: Insufficient documentation

## 2012-10-13 DIAGNOSIS — Z87891 Personal history of nicotine dependence: Secondary | ICD-10-CM | POA: Insufficient documentation

## 2012-10-13 DIAGNOSIS — F329 Major depressive disorder, single episode, unspecified: Secondary | ICD-10-CM | POA: Insufficient documentation

## 2012-10-13 DIAGNOSIS — Z8669 Personal history of other diseases of the nervous system and sense organs: Secondary | ICD-10-CM | POA: Insufficient documentation

## 2012-10-13 DIAGNOSIS — Z880 Allergy status to penicillin: Secondary | ICD-10-CM | POA: Insufficient documentation

## 2012-10-13 DIAGNOSIS — Z8619 Personal history of other infectious and parasitic diseases: Secondary | ICD-10-CM | POA: Insufficient documentation

## 2012-10-13 DIAGNOSIS — I2510 Atherosclerotic heart disease of native coronary artery without angina pectoris: Secondary | ICD-10-CM | POA: Insufficient documentation

## 2012-10-13 DIAGNOSIS — Z9089 Acquired absence of other organs: Secondary | ICD-10-CM | POA: Insufficient documentation

## 2012-10-13 DIAGNOSIS — F411 Generalized anxiety disorder: Secondary | ICD-10-CM | POA: Insufficient documentation

## 2012-10-13 DIAGNOSIS — I252 Old myocardial infarction: Secondary | ICD-10-CM | POA: Insufficient documentation

## 2012-10-13 DIAGNOSIS — E119 Type 2 diabetes mellitus without complications: Secondary | ICD-10-CM

## 2012-10-13 DIAGNOSIS — I10 Essential (primary) hypertension: Secondary | ICD-10-CM | POA: Insufficient documentation

## 2012-10-13 DIAGNOSIS — F3289 Other specified depressive episodes: Secondary | ICD-10-CM | POA: Insufficient documentation

## 2012-10-13 DIAGNOSIS — Z9851 Tubal ligation status: Secondary | ICD-10-CM | POA: Insufficient documentation

## 2012-10-13 DIAGNOSIS — K219 Gastro-esophageal reflux disease without esophagitis: Secondary | ICD-10-CM | POA: Insufficient documentation

## 2012-10-13 DIAGNOSIS — Z79899 Other long term (current) drug therapy: Secondary | ICD-10-CM | POA: Insufficient documentation

## 2012-10-13 DIAGNOSIS — N289 Disorder of kidney and ureter, unspecified: Secondary | ICD-10-CM | POA: Insufficient documentation

## 2012-10-13 DIAGNOSIS — Z8679 Personal history of other diseases of the circulatory system: Secondary | ICD-10-CM | POA: Insufficient documentation

## 2012-10-13 LAB — CBC WITH DIFFERENTIAL/PLATELET
Basophils Relative: 1 % (ref 0–1)
Eosinophils Absolute: 0.3 10*3/uL (ref 0.0–0.7)
Lymphs Abs: 2.8 10*3/uL (ref 0.7–4.0)
MCH: 30.7 pg (ref 26.0–34.0)
Neutro Abs: 5.8 10*3/uL (ref 1.7–7.7)
Neutrophils Relative %: 59 % (ref 43–77)
Platelets: 269 10*3/uL (ref 150–400)
RBC: 3.98 MIL/uL (ref 3.87–5.11)

## 2012-10-13 LAB — URINE MICROSCOPIC-ADD ON

## 2012-10-13 LAB — COMPREHENSIVE METABOLIC PANEL
ALT: 40 U/L — ABNORMAL HIGH (ref 0–35)
Albumin: 3.5 g/dL (ref 3.5–5.2)
Alkaline Phosphatase: 54 U/L (ref 39–117)
Glucose, Bld: 187 mg/dL — ABNORMAL HIGH (ref 70–99)
Potassium: 3.9 mEq/L (ref 3.5–5.1)
Sodium: 137 mEq/L (ref 135–145)
Total Protein: 7.7 g/dL (ref 6.0–8.3)

## 2012-10-13 LAB — URINALYSIS, ROUTINE W REFLEX MICROSCOPIC
Bilirubin Urine: NEGATIVE
Hgb urine dipstick: NEGATIVE
Specific Gravity, Urine: 1.009 (ref 1.005–1.030)
pH: 6 (ref 5.0–8.0)

## 2012-10-13 LAB — D-DIMER, QUANTITATIVE: D-Dimer, Quant: 0.54 ug/mL-FEU — ABNORMAL HIGH (ref 0.00–0.48)

## 2012-10-13 MED ORDER — ONDANSETRON HCL 4 MG/2ML IJ SOLN
4.0000 mg | Freq: Once | INTRAMUSCULAR | Status: AC
  Administered 2012-10-13: 4 mg via INTRAVENOUS
  Filled 2012-10-13: qty 2

## 2012-10-13 MED ORDER — SODIUM CHLORIDE 0.9 % IV SOLN
INTRAVENOUS | Status: DC
  Administered 2012-10-13: 23:00:00 via INTRAVENOUS

## 2012-10-13 MED ORDER — DIPHENHYDRAMINE HCL 50 MG/ML IJ SOLN
25.0000 mg | Freq: Once | INTRAMUSCULAR | Status: AC
  Administered 2012-10-13: 25 mg via INTRAVENOUS
  Filled 2012-10-13: qty 1

## 2012-10-13 MED ORDER — HYDROMORPHONE HCL PF 1 MG/ML IJ SOLN
0.5000 mg | Freq: Once | INTRAMUSCULAR | Status: AC
  Administered 2012-10-13: 0.5 mg via INTRAVENOUS
  Filled 2012-10-13: qty 1

## 2012-10-13 MED ORDER — FUROSEMIDE 10 MG/ML IJ SOLN
40.0000 mg | Freq: Once | INTRAMUSCULAR | Status: AC
  Administered 2012-10-13: 40 mg via INTRAVENOUS
  Filled 2012-10-13: qty 4

## 2012-10-13 MED ORDER — HYDRALAZINE HCL 20 MG/ML IJ SOLN
10.0000 mg | Freq: Once | INTRAMUSCULAR | Status: AC
  Administered 2012-10-13: 10 mg via INTRAVENOUS
  Filled 2012-10-13: qty 1

## 2012-10-13 NOTE — Consult Note (Signed)
[facility] History and Physical  CHANTRELL APSEY NWG:[id number] DOB: 11/11/1945    PCP:   Alice Reichert, MD   Chief Complaint: abdominal cramps.  HPI: [name] is an 67 y.o. female with hx of colonic polyps, s/p colonoscopy about a year ago, DM, HTN, hypercholesterolemia, CAD, prior type II MI, presents to the ER complaining of abdominal cramps.  She is very concerned that she has colon cancer.  She has intermittent diarrhea and constipation and this is not new.  Also has DOE with hx of distolic CHF followed by Dr Allyson Sabal.  This is not a new complaint neither.  Evaluation in the ER showed CXR with no acute process and no CHF.  BNP 1400, D-Dimer of 0.54.  She has no chest pain.  Her EKG showed nonspecific ST_T changes.  In the ER, her BP was 190/45.  Hospitalist was asked to evaluate her for CHF treatment.  Rewiew of Systems:  Constitutional: Negative for malaise, fever and chills. No significant weight loss or weight gain Eyes: Negative for eye pain, redness and discharge, diplopia, visual changes, or flashes of light. ENMT: Negative for ear pain, hoarseness, nasal congestion, sinus pressure and sore throat. No headaches; tinnitus, drooling, or problem swallowing. Cardiovascular: Negative for chest pain, palpitations, diaphoresis, dyspnea and peripheral edema. ; No orthopnea, PND Respiratory: Negative for cough, hemoptysis, wheezing and stridor. No pleuritic chestpain. Gastrointestinal: Negative for nausea, vomiting, diarrhea, constipation, melena, blood in stool, hematemesis, jaundice and rectal bleeding.    Genitourinary: Negative for frequency, dysuria, incontinence,flank pain and hematuria; Musculoskeletal: Negative for back pain and neck pain. Negative for swelling and trauma.;  Skin: . Negative for pruritus, rash, abrasions, bruising and skin lesion.; ulcerations Neuro: Negative for headache, lightheadedness and neck stiffness. Negative for weakness, altered level of  consciousness , altered mental status, extremity weakness, burning feet, involuntary movement, seizure and syncope.  Psych: negative for anxiety, depression, insomnia, tearfulness, panic attacks, hallucinations, paranoia, suicidal or homicidal ideation    Past Medical History  Diagnosis Date  . HTN (hypertension)   . Elevated troponin level 09/28/11    Type II MI - not ACS related  . Diastolic dysfunction, left ventricle     with previous Diastolic HF  . Diabetes mellitus   . High cholesterol   . Kidney disease   . Cataracts, bilateral   . 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 Pulmonary HTN-severe- due to diastolic dysfunction 09/29/2011    Moderate to Severe Pulm HTN.  Marland Kitchen Acute renal failure 09/29/2011  . 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 HYPOTHYROIDISM 06/19/2008    Qualifier: Diagnosis of  By: Excell Seltzer CMA, Lawson Fiscal    . Renal artery stenosis     s/p stents (restenosis)  . Subclavian arterial stenosis   . Peripheral neuropathy   . Coronary artery disease   . Myocardial infarction   . Pericarditis   . Anxiety   . Depression   . Shortness of breath   . CHF (congestive heart failure)   . GERD (gastroesophageal reflux disease)   . Obstructive sleep apnea on nocturnal CPAP 06/20/2008    uses CPAP @ night  . Unstable angina   . Stroke   . Arthritis     Past Surgical History  Procedure Laterality Date  . Cardiac catheterization  01/2011    Minimal luminal irregularties  . Laparoscopic tubal ligation    . Renal artery stent      x 2 (restenosis)  .  Colonoscopy  10/12/2011    Procedure: COLONOSCOPY;  Surgeon: Louis Meckel, MD;  Location: [facility] ENDOSCOPY;  Service: Endoscopy;  Laterality: N/A;  . Esophagogastroduodenoscopy  10/12/2011    Procedure: ESOPHAGOGASTRODUODENOSCOPY (EGD);  Surgeon: Louis Meckel, MD;  Location: [facility] ENDOSCOPY;  Service: Endoscopy;  Laterality: N/A;  . Tubal ligation    .  Colonoscopy  10/21/2011    Procedure: COLONOSCOPY;  Surgeon: Hilarie Fredrickson, MD;  Location: [facility] ENDOSCOPY;  Service: Endoscopy;  Laterality: N/A;  . Colonoscopy  10/25/2011    Procedure: COLONOSCOPY;  Surgeon: Beverley Fiedler, MD;  Location: [facility] ENDOSCOPY;  Service: Gastroenterology;  Laterality: N/A;  . Colonoscopy  10/23/2011    Procedure: COLONOSCOPY;  Surgeon: Hilarie Fredrickson, MD;  Location: [facility] ENDOSCOPY;  Service: Endoscopy;  Laterality: N/A;  . Colon surgery    . Back surgery       x 2  . Cataract extraction w/phaco  03/29/2012    Procedure: CATARACT EXTRACTION PHACO AND INTRAOCULAR LENS PLACEMENT (IOC);  Surgeon: Gemma Payor, MD;  Location: [facility] ORS;  Service: Ophthalmology;  Laterality: Right;  CDE: 16.87  . Cataract extraction w/phaco  04/16/2012    Procedure: CATARACT EXTRACTION PHACO AND INTRAOCULAR LENS PLACEMENT (IOC);  Surgeon: Gemma Payor, MD;  Location: [facility] ORS;  Service: Ophthalmology;  Laterality: Left;  CDE:  14.98    Medications:  HOME MEDS: Prior to Admission medications   Medication Sig Start Date End Date Taking? Authorizing Provider  ALPRAZolam Prudy Feeler) 0.5 MG tablet Take 0.5 mg by mouth at bedtime as needed. For anxiety   Yes Historical Provider, MD  Biotin 5000 MCG CAPS Take 1 capsule by mouth daily.   Yes Historical Provider, MD  escitalopram (LEXAPRO) 20 MG tablet Take 20 mg by mouth daily.   Yes Historical Provider, MD  fish oil-omega-3 fatty acids 1000 MG capsule Take 1 g by mouth 2 (two) times daily.   Yes Historical Provider, MD  furosemide (LASIX) 40 MG tablet Take 40 mg by mouth 2 (two) times daily.   Yes Historical Provider, MD  hydrALAZINE (APRESOLINE) 50 MG tablet Take 50 mg by mouth 3 (three) times daily.   Yes Historical Provider, MD  insulin glargine (LANTUS) 100 UNIT/ML injection Inject 62 Units into the skin at bedtime.  10/30/11  Yes Calvert Cantor, MD  insulin lispro (HUMALOG) 100 UNIT/ML injection Inject 10-30 Units into the skin 3 (three) times daily before meals. Uses  according to a sliding scale at home.   Yes Historical Provider, MD  isosorbide mononitrate (IMDUR) 60 MG 24 hr tablet Take 60 mg by mouth daily.     Yes Historical Provider, MD  levothyroxine (SYNTHROID, LEVOTHROID) 112 MCG tablet Take 112 mcg by mouth daily.     Yes Historical Provider, MD  nebivolol (BYSTOLIC) 2.5 MG tablet Take 2.5 mg by mouth daily.     Yes Historical Provider, MD  omeprazole (PRILOSEC) 20 MG capsule Take 40 mg by mouth daily.    Yes Historical Provider, MD  potassium chloride SA (K-DUR,KLOR-CON) 20 MEQ tablet Take 1 tablet (20 mEq total) by mouth daily. 10/13/11  Yes Laveda Norman, MD     Allergies:  Allergies  Allergen Reactions  . Codeine Anaphylaxis    swelling  . Lidocaine Anaphylaxis  . Atenolol Other (See Comments)    Unknown  . Bystolic (Nebivolol Hcl)     Cannot tolerate greater then 5 mg, causes chest pain  . Ciprofloxacin Itching  . Metformin Nausea And Vomiting  . Metoprolol  Other (See Comments)    Burns ears  . Morphine And Related Other (See Comments)    Makes patient feel like she is floating  . Penicillins Rash    Social History:   reports that she quit smoking about 8 years ago. Her smoking use included Cigarettes. She has a 25 pack-year smoking history. She does not have any smokeless tobacco history on file. She reports that she does not drink alcohol or use illicit drugs.  Family History: Family History  Problem Relation Age of Onset  . Heart disease Mother   . Heart attack Mother   . Heart disease Father   . Heart attack Father   . Heart disease Sister   . Heart disease Daughter      Physical Exam: Filed Vitals:   10/13/12 2059 10/13/12 2104 10/13/12 2119 10/13/12 2244  BP: 243/59 215/52 208/47 193/45  Pulse: 65  62 53  Temp: 98.2 F (36.8 C)     TempSrc: Oral     Resp: 16  20 16   SpO2: 97%  97% 98%   Blood pressure 193/45, pulse 53, temperature 98.2 F (36.8 C), temperature source Oral, resp. rate 16, SpO2 98.00%.  GEN:   Pleasant patient lying in the stretcher in no acute distress; cooperative with exam. PSYCH:  alert and oriented x4; does not appear anxious or depressed; affect is appropriate. HEENT: Mucous membranes pink and anicteric; PERRLA; EOM intact; no cervical lymphadenopathy nor thyromegaly or carotid bruit; no JVD; There were no stridor. Neck is very supple. Breasts:: Not examined CHEST WALL: No tenderness CHEST: Normal respiration, slight bibisilar rales.  HEART: Regular rate and rhythm.  There are no murmur, rub, or gallops.   BACK: No kyphosis or scoliosis; no CVA tenderness ABDOMEN: soft and non-tender; no masses, no organomegaly, normal abdominal bowel sounds; no pannus; no intertriginous candida. There is no rebound and no distention. Rectal Exam: Not done EXTREMITIES: No bone or joint deformity; age-appropriate arthropathy of the hands and knees; no edema; no ulcerations.  There is no calf tenderness. Genitalia: not examined PULSES: 2+ and symmetric SKIN: Normal hydration no rash or ulceration CNS: Cranial nerves 2-12 grossly intact no focal lateralizing neurologic deficit.  Speech is fluent; uvula elevated with phonation, facial symmetry and tongue midline. DTR are normal bilaterally, cerebella exam is intact, barbinski is negative and strengths are equaled bilaterally.  No sensory loss.   Labs on Admission:  Basic Metabolic Panel:  Recent Labs Lab 10/13/12 2121  NA 137  K 3.9  CL 101  CO2 22  GLUCOSE 187*  BUN 47*  CREATININE 1.55*  CALCIUM 10.4   Liver Function Tests:  Recent Labs Lab 10/13/12 2121  AST 31  ALT 40*  ALKPHOS 54  BILITOT 0.4  PROT 7.7  ALBUMIN 3.5    Recent Labs Lab 10/13/12 2121  LIPASE 46   No results found for this basename: AMMONIA,  in the last 168 hours CBC:  Recent Labs Lab 10/13/12 2121  WBC 9.9  NEUTROABS 5.8  HGB 12.2  HCT 35.5*  MCV 89.2  PLT 269   Cardiac Enzymes: No results found for this basename: CKTOTAL, CKMB,  CKMBINDEX, TROPONINI,  in the last 168 hours  CBG: No results found for this basename: GLUCAP,  in the last 168 hours   Radiological Exams on Admission: Dg Chest 2 View  10/13/2012   *RADIOLOGY REPORT*  Clinical Data: Chest pain  CHEST - 2 VIEW  Comparison: 10/19/2011  Findings: Cardiac leads overlie the chest.  Heart size is normal. Lung volumes are low but clear.  No pleural effusion.  No acute osseous finding. Chronically mildly prominent interstitial markings are again noted.  IMPRESSION: No focal acute finding.   Original Report Authenticated By: Christiana Pellant, M.D.    EKG: Independently reviewed. Nonspecific ST-T changes.   Assessment/Plan HTN, Chronic abdominal cramps Hx of diastolic CHF.  PLAN:  I don't think she needs inpatient treatment.  She should follow up with her PCP for her abdominal cramps and intermittent diarrhea with constipation (?IBS).  She has hx of diastolic HF, and is not in heart failure at this time.  I recommended giving her one dose of Hydralazine IV 10mg , and one dose of Lasix 40mg  IV, and discharge her with specific intructions to return if her BP improves.  She agreed with this plan.  Thank you for asking me to partake in the care of your nice patient.  Other plans as per orders.  Code Status: FULL Unk Lightning, MD. [facility] Pager [phone] 7pm to 7am.  10/13/2012, 11:30 PM

## 2012-10-13 NOTE — ED Notes (Signed)
Pt c/o bilateral lower abdominal cramps, diarrhea, and SOB intermittently x 6 months. Weakness, nausea, but denies emesis.

## 2012-10-13 NOTE — ED Notes (Signed)
Patient transported to X-ray 

## 2012-10-13 NOTE — ED Provider Notes (Addendum)
History     CSN: [id number]  Arrival date & time 10/13/12  2057   First MD Initiated Contact with Patient 10/13/12 2121      Chief Complaint  Patient presents with  . Abdominal Pain    (Consider location/radiation/quality/duration/timing/severity/associated sxs/prior treatment) Patient is a 67 y.o. female presenting with abdominal pain. The history is provided by the patient and the spouse.  Abdominal Pain Associated symptoms include abdominal pain.   patient here complaining of dyspnea on exertion and orthopnea x24 hours. Patient does have a history of heart failure. She also notes intermittent chest tightness that is not like her prior anginal equivalent. Complains of having lower abdominal cramping x6 months associated with watery diarrhea. Denies any fever or chills. No urinary symptoms. Abdominal symptoms have been persistent and nothing makes it worse. Also has weakness has been constant as well. Denies any severe headaches at this time.  Past Medical History  Diagnosis Date  . HTN (hypertension)   . Elevated troponin level 09/28/11    Type II MI - not ACS related  . Diastolic dysfunction, left ventricle     with previous Diastolic HF  . Diabetes mellitus   . High cholesterol   . Kidney disease   . Cataracts, bilateral   . 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 Pulmonary HTN-severe- due to diastolic dysfunction 09/29/2011    Moderate to Severe Pulm HTN.  Marland Kitchen Acute renal failure 09/29/2011  . 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 HYPOTHYROIDISM 06/19/2008    Qualifier: Diagnosis of  By: Excell Seltzer CMA, Lawson Fiscal    . Renal artery stenosis     s/p stents (restenosis)  . Subclavian arterial stenosis   . Peripheral neuropathy   . Coronary artery disease   . Myocardial infarction   . Pericarditis   . Anxiety   . Depression   . Shortness of breath   . CHF (congestive heart failure)   . GERD  (gastroesophageal reflux disease)   . Obstructive sleep apnea on nocturnal CPAP 06/20/2008    uses CPAP @ night  . Unstable angina   . Stroke   . Arthritis     Past Surgical History  Procedure Laterality Date  . Cardiac catheterization  01/2011    Minimal luminal irregularties  . Laparoscopic tubal ligation    . Renal artery stent      x 2 (restenosis)  . Colonoscopy  10/12/2011    Procedure: COLONOSCOPY;  Surgeon: Louis Meckel, MD;  Location: [facility] ENDOSCOPY;  Service: Endoscopy;  Laterality: N/A;  . Esophagogastroduodenoscopy  10/12/2011    Procedure: ESOPHAGOGASTRODUODENOSCOPY (EGD);  Surgeon: Louis Meckel, MD;  Location: [facility] ENDOSCOPY;  Service: Endoscopy;  Laterality: N/A;  . Tubal ligation    . Colonoscopy  10/21/2011    Procedure: COLONOSCOPY;  Surgeon: Hilarie Fredrickson, MD;  Location: [facility] ENDOSCOPY;  Service: Endoscopy;  Laterality: N/A;  . Colonoscopy  10/25/2011    Procedure: COLONOSCOPY;  Surgeon: Beverley Fiedler, MD;  Location: [facility] ENDOSCOPY;  Service: Gastroenterology;  Laterality: N/A;  . Colonoscopy  10/23/2011    Procedure: COLONOSCOPY;  Surgeon: Hilarie Fredrickson, MD;  Location: [facility] ENDOSCOPY;  Service: Endoscopy;  Laterality: N/A;  . Colon surgery    . Back surgery       x 2  . Cataract extraction w/phaco  03/29/2012    Procedure: CATARACT EXTRACTION PHACO AND INTRAOCULAR LENS PLACEMENT (IOC);  Surgeon: Gemma Payor, MD;  Location: [facility] ORS;  Service:  Ophthalmology;  Laterality: Right;  CDE: 16.87  . Cataract extraction w/phaco  04/16/2012    Procedure: CATARACT EXTRACTION PHACO AND INTRAOCULAR LENS PLACEMENT (IOC);  Surgeon: Gemma Payor, MD;  Location: [facility] ORS;  Service: Ophthalmology;  Laterality: Left;  CDE:  14.98    Family History  Problem Relation Age of Onset  . Heart disease Mother   . Heart attack Mother   . Heart disease Father   . Heart attack Father   . Heart disease Sister   . Heart disease Daughter     History  Substance Use Topics  . Smoking status: Former Smoker --  1.00 packs/day for 25 years    Types: Cigarettes    Quit date: 05/23/2004  . Smokeless tobacco: Not on file  . Alcohol Use: No    OB History   Grav Para Term Preterm Abortions TAB SAB Ect Mult Living                  Review of Systems  Gastrointestinal: Positive for abdominal pain.  All other systems reviewed and are negative.    Allergies  Codeine; Lidocaine; Atenolol; Bystolic; Ciprofloxacin; Metformin; Metoprolol; Morphine and related; and Penicillins  Home Medications   Current Outpatient Rx  Name  Route  Sig  Dispense  Refill  . ALPRAZolam (XANAX) 0.5 MG tablet   Oral   Take 0.5 mg by mouth at bedtime as needed. For anxiety         . aspirin EC 81 MG tablet   Oral   Take 81 mg by mouth daily.         . Bromfenac Sodium (PROLENSA) 0.07 % SOLN   Right Eye   Place 1 drop into the right eye daily.         . Cholecalciferol (VITAMIN D3) 2000 UNITS TABS   Oral   Take 1 tablet by mouth at bedtime.           . Difluprednate (DUREZOL) 0.05 % EMUL   Right Eye   Place 1 drop into the right eye 3 (three) times daily.         Marland Kitchen escitalopram (LEXAPRO) 20 MG tablet   Oral   Take 20 mg by mouth daily.         . fish oil-omega-3 fatty acids 1000 MG capsule   Oral   Take 1 g by mouth 2 (two) times daily.         . furosemide (LASIX) 20 MG tablet   Oral   Take 20 mg by mouth daily.         Marland Kitchen EXPIRED: hydrALAZINE (APRESOLINE) 50 MG tablet   Oral   Take 50 mg by mouth 3 (three) times daily.         . insulin glargine (LANTUS) 100 UNIT/ML injection   Subcutaneous   Inject 63 Units into the skin at bedtime.         . insulin lispro (HUMALOG) 100 UNIT/ML injection   Subcutaneous   Inject 10-30 Units into the skin 3 (three) times daily before meals. Uses according to a sliding scale at home.         . isosorbide mononitrate (IMDUR) 60 MG 24 hr tablet   Oral   Take 60 mg by mouth daily.           Marland Kitchen levothyroxine (SYNTHROID, LEVOTHROID) 112  MCG tablet   Oral   Take 112 mcg by mouth daily.           Marland Kitchen  nebivolol (BYSTOLIC) 2.5 MG tablet   Oral   Take 2.5 mg by mouth daily.           Marland Kitchen omeprazole (PRILOSEC) 20 MG capsule   Oral   Take 20 mg by mouth daily.         . potassium chloride SA (K-DUR,KLOR-CON) 20 MEQ tablet   Oral   Take 1 tablet (20 mEq total) by mouth daily.   30 tablet   0   . sodium chloride (OCEAN) 0.65 % nasal spray   Nasal   Place 1 spray into the nose as needed. Congestion           BP 208/47  Pulse 62  Temp(Src) 98.2 F (36.8 C) (Oral)  Resp 20  SpO2 97%  Physical Exam  Nursing note and vitals reviewed. Constitutional: She is oriented to person, place, and time. She appears well-developed and well-nourished.  Non-toxic appearance. No distress.  HENT:  Head: Normocephalic and atraumatic.  Eyes: Conjunctivae, EOM and lids are normal. Pupils are equal, round, and reactive to light.  Neck: Normal range of motion. Neck supple. No tracheal deviation present. No mass present.  Cardiovascular: Normal rate, regular rhythm and normal heart sounds.  Exam reveals no gallop.   No murmur heard. Pulmonary/Chest: Effort normal and breath sounds normal. No stridor. No respiratory distress. She has no decreased breath sounds. She has no wheezes. She has no rhonchi. She has no rales.  Abdominal: Soft. Normal appearance and bowel sounds are normal. She exhibits no distension. There is no tenderness. There is no rigidity, no rebound, no guarding and no CVA tenderness.  Musculoskeletal: Normal range of motion. She exhibits no edema and no tenderness.  Neurological: She is alert and oriented to person, place, and time. She has normal strength. No cranial nerve deficit or sensory deficit. GCS eye subscore is 4. GCS verbal subscore is 5. GCS motor subscore is 6.  Skin: Skin is warm and dry. No abrasion and no rash noted.  Psychiatric: She has a normal mood and affect. Her speech is normal and behavior is  normal.    ED Course  Procedures (including critical care time)  Labs Reviewed  CBC WITH DIFFERENTIAL  COMPREHENSIVE METABOLIC PANEL  LIPASE, BLOOD  URINALYSIS, ROUTINE W REFLEX MICROSCOPIC  PRO B NATRIURETIC PEPTIDE  D-DIMER, QUANTITATIVE   No results found.   No diagnosis found.    MDM   Date: 10/13/2012  Rate: 54  Rhythm: sinus bradycardia  QRS Axis: normal  Intervals: normal  ST/T Wave abnormalities: nonspecific ST changes  Conduction Disutrbances:none  Narrative Interpretation:   Old EKG Reviewed: none available  Spoke with Dr. Nedra Hai from tract hospital[facility] and he will come and see the patient   11:34 PM Pt seen by dr Conley Rolls and he has left a consult note and request the pt be discharged--he has given f/u instructions and ordered meds here     Toy Baker, MD 10/13/12 2312  Toy Baker, MD 10/13/12 [phone]

## 2012-10-14 NOTE — ED Notes (Signed)
The patient is AOx4 and comfortable with her discharge instructions. 

## 2012-10-14 NOTE — ED Notes (Signed)
CBG 106 

## 2012-10-18 ENCOUNTER — Other Ambulatory Visit (HOSPITAL_COMMUNITY): Payer: Self-pay | Admitting: Cardiovascular Disease

## 2012-10-18 DIAGNOSIS — I739 Peripheral vascular disease, unspecified: Secondary | ICD-10-CM

## 2012-10-19 ENCOUNTER — Other Ambulatory Visit (HOSPITAL_COMMUNITY): Payer: Self-pay | Admitting: Cardiovascular Disease

## 2012-10-19 DIAGNOSIS — R200 Anesthesia of skin: Secondary | ICD-10-CM

## 2012-10-19 DIAGNOSIS — I82409 Acute embolism and thrombosis of unspecified deep veins of unspecified lower extremity: Secondary | ICD-10-CM

## 2012-10-19 DIAGNOSIS — I701 Atherosclerosis of renal artery: Secondary | ICD-10-CM

## 2012-11-28 ENCOUNTER — Encounter: Payer: Self-pay | Admitting: Gastroenterology

## 2012-11-29 ENCOUNTER — Ambulatory Visit (HOSPITAL_COMMUNITY)
Admission: RE | Admit: 2012-11-29 | Discharge: 2012-11-29 | Disposition: A | Discharge status: Home / Self Care | Payer: Medicare Other | Source: Ambulatory Visit | Attending: Cardiovascular Disease | Admitting: Cardiovascular Disease

## 2012-11-29 ENCOUNTER — Ambulatory Visit (HOSPITAL_BASED_OUTPATIENT_CLINIC_OR_DEPARTMENT_OTHER)
Admission: RE | Admit: 2012-11-29 | Discharge: 2012-11-29 | Disposition: A | Discharge status: Home / Self Care | Payer: Medicare Other | Source: Ambulatory Visit | Attending: Cardiovascular Disease | Admitting: Cardiovascular Disease

## 2012-11-29 DIAGNOSIS — I739 Peripheral vascular disease, unspecified: Secondary | ICD-10-CM

## 2012-11-29 DIAGNOSIS — R200 Anesthesia of skin: Secondary | ICD-10-CM

## 2012-11-29 DIAGNOSIS — I82409 Acute embolism and thrombosis of unspecified deep veins of unspecified lower extremity: Secondary | ICD-10-CM

## 2012-11-29 DIAGNOSIS — I701 Atherosclerosis of renal artery: Secondary | ICD-10-CM | POA: Insufficient documentation

## 2012-11-29 DIAGNOSIS — Z86718 Personal history of other venous thrombosis and embolism: Secondary | ICD-10-CM | POA: Insufficient documentation

## 2012-11-29 DIAGNOSIS — R209 Unspecified disturbances of skin sensation: Secondary | ICD-10-CM | POA: Insufficient documentation

## 2012-11-29 DIAGNOSIS — I70219 Atherosclerosis of native arteries of extremities with intermittent claudication, unspecified extremity: Secondary | ICD-10-CM

## 2012-11-29 DIAGNOSIS — I10 Essential (primary) hypertension: Secondary | ICD-10-CM

## 2012-11-29 NOTE — Progress Notes (Signed)
Renal Duplex Completed. Noora Locascio, RDMS, RVT  

## 2012-11-29 NOTE — Progress Notes (Signed)
Arterial Lower Ext. Completed. [name], RDMS, RVT  

## 2012-11-29 NOTE — Progress Notes (Signed)
Right Upper Ext. Venous Completed. No evidence of acute DVT noted.  Marilynne Halsted, RDMS, RVT

## 2012-12-04 ENCOUNTER — Telehealth: Payer: Self-pay | Admitting: Cardiovascular Disease

## 2012-12-04 ENCOUNTER — Encounter (HOSPITAL_COMMUNITY): Payer: Self-pay

## 2012-12-04 ENCOUNTER — Observation Stay (HOSPITAL_COMMUNITY)
Admission: EM | Admit: 2012-12-04 | Discharge: 2012-12-07 | Disposition: A | Discharge status: Home / Self Care | Payer: Medicare Other | Attending: Family Medicine | Admitting: Family Medicine

## 2012-12-04 ENCOUNTER — Emergency Department (HOSPITAL_COMMUNITY): Payer: Medicare Other

## 2012-12-04 ENCOUNTER — Observation Stay (HOSPITAL_COMMUNITY): Payer: Medicare Other

## 2012-12-04 DIAGNOSIS — I5030 Unspecified diastolic (congestive) heart failure: Secondary | ICD-10-CM | POA: Insufficient documentation

## 2012-12-04 DIAGNOSIS — N179 Acute kidney failure, unspecified: Secondary | ICD-10-CM | POA: Diagnosis present

## 2012-12-04 DIAGNOSIS — E785 Hyperlipidemia, unspecified: Secondary | ICD-10-CM | POA: Diagnosis present

## 2012-12-04 DIAGNOSIS — E1129 Type 2 diabetes mellitus with other diabetic kidney complication: Secondary | ICD-10-CM | POA: Diagnosis present

## 2012-12-04 DIAGNOSIS — I129 Hypertensive chronic kidney disease with stage 1 through stage 4 chronic kidney disease, or unspecified chronic kidney disease: Secondary | ICD-10-CM | POA: Insufficient documentation

## 2012-12-04 DIAGNOSIS — N2889 Other specified disorders of kidney and ureter: Secondary | ICD-10-CM | POA: Diagnosis present

## 2012-12-04 DIAGNOSIS — N183 Chronic kidney disease, stage 3 unspecified: Secondary | ICD-10-CM | POA: Insufficient documentation

## 2012-12-04 DIAGNOSIS — I2510 Atherosclerotic heart disease of native coronary artery without angina pectoris: Secondary | ICD-10-CM | POA: Insufficient documentation

## 2012-12-04 DIAGNOSIS — G459 Transient cerebral ischemic attack, unspecified: Principal | ICD-10-CM | POA: Diagnosis present

## 2012-12-04 DIAGNOSIS — E039 Hypothyroidism, unspecified: Secondary | ICD-10-CM | POA: Diagnosis present

## 2012-12-04 DIAGNOSIS — R42 Dizziness and giddiness: Secondary | ICD-10-CM | POA: Diagnosis present

## 2012-12-04 DIAGNOSIS — G4730 Sleep apnea, unspecified: Secondary | ICD-10-CM | POA: Diagnosis present

## 2012-12-04 DIAGNOSIS — I272 Pulmonary hypertension, unspecified: Secondary | ICD-10-CM | POA: Diagnosis present

## 2012-12-04 DIAGNOSIS — J449 Chronic obstructive pulmonary disease, unspecified: Secondary | ICD-10-CM

## 2012-12-04 DIAGNOSIS — I509 Heart failure, unspecified: Secondary | ICD-10-CM | POA: Insufficient documentation

## 2012-12-04 DIAGNOSIS — E119 Type 2 diabetes mellitus without complications: Secondary | ICD-10-CM | POA: Insufficient documentation

## 2012-12-04 DIAGNOSIS — D649 Anemia, unspecified: Secondary | ICD-10-CM | POA: Diagnosis present

## 2012-12-04 LAB — RAPID URINE DRUG SCREEN, HOSP PERFORMED
Barbiturates: NOT DETECTED
Cocaine: NOT DETECTED

## 2012-12-04 LAB — URINALYSIS, ROUTINE W REFLEX MICROSCOPIC
Leukocytes, UA: NEGATIVE
Nitrite: NEGATIVE
Specific Gravity, Urine: 1.015 (ref 1.005–1.030)
pH: 7.5 (ref 5.0–8.0)

## 2012-12-04 LAB — DIFFERENTIAL
Basophils Absolute: 0.1 10*3/uL (ref 0.0–0.1)
Lymphocytes Relative: 24 % (ref 12–46)
Lymphs Abs: 1.8 10*3/uL (ref 0.7–4.0)
Monocytes Absolute: 0.6 10*3/uL (ref 0.1–1.0)
Monocytes Relative: 9 % (ref 3–12)
Neutro Abs: 4.5 10*3/uL (ref 1.7–7.7)

## 2012-12-04 LAB — CBC
HCT: 38.1 % (ref 36.0–46.0)
Hemoglobin: 13 g/dL (ref 12.0–15.0)
RBC: 4.23 MIL/uL (ref 3.87–5.11)
RDW: 13.6 % (ref 11.5–15.5)
WBC: 7.3 10*3/uL (ref 4.0–10.5)

## 2012-12-04 LAB — GLUCOSE, CAPILLARY
Glucose-Capillary: 172 mg/dL — ABNORMAL HIGH (ref 70–99)
Glucose-Capillary: 85 mg/dL (ref 70–99)
Glucose-Capillary: 111 mg/dL — ABNORMAL HIGH (ref 70–99)

## 2012-12-04 LAB — COMPREHENSIVE METABOLIC PANEL
AST: 39 U/L — ABNORMAL HIGH (ref 0–37)
CO2: 29 mEq/L (ref 19–32)
Chloride: 100 mEq/L (ref 96–112)
Creatinine, Ser: 1.52 mg/dL — ABNORMAL HIGH (ref 0.50–1.10)
GFR calc non Af Amer: 35 mL/min — ABNORMAL LOW (ref >90)
Glucose, Bld: 170 mg/dL — ABNORMAL HIGH (ref 70–99)
Total Bilirubin: 0.3 mg/dL (ref 0.3–1.2)

## 2012-12-04 LAB — URINE MICROSCOPIC-ADD ON

## 2012-12-04 LAB — APTT: aPTT: 26 seconds (ref 24–37)

## 2012-12-04 MED ORDER — VITAMIN D 1000 UNITS PO TABS
2000.0000 [IU] | ORAL_TABLET | Freq: Every day | ORAL | Status: DC
  Administered 2012-12-04 – 2012-12-06 (×3): 2000 [IU] via ORAL
  Filled 2012-12-04 (×3): qty 2

## 2012-12-04 MED ORDER — ISOSORBIDE MONONITRATE ER 60 MG PO TB24
60.0000 mg | ORAL_TABLET | Freq: Every day | ORAL | Status: DC
  Administered 2012-12-06: 60 mg via ORAL
  Filled 2012-12-04: qty 1

## 2012-12-04 MED ORDER — ESCITALOPRAM OXALATE 10 MG PO TABS
20.0000 mg | ORAL_TABLET | Freq: Every day | ORAL | Status: DC
  Administered 2012-12-05 – 2012-12-06 (×2): 20 mg via ORAL
  Filled 2012-12-04 (×2): qty 2

## 2012-12-04 MED ORDER — PANTOPRAZOLE SODIUM 40 MG PO TBEC
40.0000 mg | DELAYED_RELEASE_TABLET | Freq: Every day | ORAL | Status: DC
  Administered 2012-12-04 – 2012-12-06 (×3): 40 mg via ORAL
  Filled 2012-12-04 (×3): qty 1

## 2012-12-04 MED ORDER — ENOXAPARIN SODIUM 40 MG/0.4ML ~~LOC~~ SOLN
40.0000 mg | SUBCUTANEOUS | Status: DC
  Administered 2012-12-04 – 2012-12-06 (×3): 40 mg via SUBCUTANEOUS
  Filled 2012-12-04 (×3): qty 0.4

## 2012-12-04 MED ORDER — ALPRAZOLAM 1 MG PO TABS
1.0000 mg | ORAL_TABLET | Freq: Two times a day (BID) | ORAL | Status: DC
  Administered 2012-12-04 – 2012-12-06 (×6): 1 mg via ORAL
  Filled 2012-12-04 (×6): qty 1

## 2012-12-04 MED ORDER — CLOPIDOGREL BISULFATE 75 MG PO TABS
75.0000 mg | ORAL_TABLET | Freq: Every day | ORAL | Status: DC
  Administered 2012-12-04 – 2012-12-07 (×4): 75 mg via ORAL
  Filled 2012-12-04 (×4): qty 1

## 2012-12-04 MED ORDER — ACETAMINOPHEN 325 MG PO TABS
650.0000 mg | ORAL_TABLET | ORAL | Status: DC | PRN
  Administered 2012-12-04: 650 mg via ORAL
  Filled 2012-12-04: qty 2

## 2012-12-04 MED ORDER — INSULIN ASPART 100 UNIT/ML ~~LOC~~ SOLN
0.0000 [IU] | Freq: Three times a day (TID) | SUBCUTANEOUS | Status: DC
  Administered 2012-12-05: 2 [IU] via SUBCUTANEOUS
  Administered 2012-12-05: 5 [IU] via SUBCUTANEOUS
  Administered 2012-12-05: 2 [IU] via SUBCUTANEOUS
  Administered 2012-12-06 (×2): 3 [IU] via SUBCUTANEOUS
  Administered 2012-12-06 – 2012-12-07 (×2): 5 [IU] via SUBCUTANEOUS

## 2012-12-04 MED ORDER — LEVOTHYROXINE SODIUM 112 MCG PO TABS
112.0000 ug | ORAL_TABLET | Freq: Every day | ORAL | Status: DC
  Administered 2012-12-05 – 2012-12-07 (×3): 112 ug via ORAL
  Filled 2012-12-04 (×3): qty 1

## 2012-12-04 MED ORDER — ASPIRIN EC 325 MG PO TBEC
325.0000 mg | DELAYED_RELEASE_TABLET | Freq: Every day | ORAL | Status: DC
  Administered 2012-12-04 – 2012-12-06 (×3): 325 mg via ORAL
  Filled 2012-12-04 (×3): qty 1

## 2012-12-04 MED ORDER — INSULIN GLARGINE 100 UNIT/ML ~~LOC~~ SOLN
20.0000 [IU] | Freq: Every day | SUBCUTANEOUS | Status: DC
  Administered 2012-12-04 – 2012-12-06 (×2): 20 [IU] via SUBCUTANEOUS
  Filled 2012-12-04 (×3): qty 0.2

## 2012-12-04 NOTE — H&P (Signed)
[facility] History and Physical  FLORETTE THAI [name]:[id number] DOB: 04-08-1946 DOA: 12/04/2012  Referring physician:  PCP: Alice Reichert, MD  Specialists:   Chief Complaint: Altered mental  HPI: [name] is a 67 y.o. female with past medical history that includes stroke, unstable angina, sleep apnea, GERD, coronary artery disease, renal artery stenosis, hypothyroidism, pulmonary hypertension, diastolic dysfunction as evidenced to the emergency room with a chief complaint of altered mental status. Information is obtained from the patient and her husband who is at the bedside. He reports that she was in her usual state of health until this morning when she awakened and she was "out of it". She said that she did not know where she was when she woke up. She does have some residual right arm and right leg weakness from a prior CVA and believes that her right leg is a little bit weaker than her baseline. Husband denies patient demonstrated any slurred speech or facial droop. He denies any changes in her functional status during this period. Associated symptoms include dizziness headache and blurred vision. She denies any chest pain palpitation shortness of breath nausea vomiting diaphoresis. She denies any recent abdominal pain but does state that she has experienced some abdominal bloating and intermittent diarrhea. She denies any bright red blood per rectum or melena. Husband took patient to her primary care provider's office where reportedly her blood pressure was 210/80. He referred her to the emergency room for further evaluation. Lab work in the emergency room is significant for a creatinine of 1.5 to AST 39 ALT 44 and serum glucose 170. Urinalysis unremarkable and CT of the head shows no acute abnormality. Symptoms came on suddenly have resolved characterized as moderate to severe. [facility] are asked to admit   Review of Systems: The patient denies anorexia, fever, weight  loss,, vision loss, decreased hearing, hoarseness, chest pain, syncope, dyspnea on exertion, peripheral edema, balance deficits, hemoptysis, abdominal pain, melena, hematochezia, severe indigestion/heartburn, hematuria, incontinence, genital sores, muscle weakness, suspicious skin lesions, transient blindness, difficulty walking, depression, unusual weight change, abnormal bleeding, enlarged lymph nodes, angioedema, and breast masses.    Past Medical History  Diagnosis Date  . HTN (hypertension)   . Elevated troponin level 09/28/11    Type II MI - not ACS related  . Diastolic dysfunction, left ventricle     with previous Diastolic HF  . Diabetes mellitus   . High cholesterol   . Kidney disease   . Cataracts, bilateral   . 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 Pulmonary HTN-severe- due to diastolic dysfunction 09/29/2011    Moderate to Severe Pulm HTN.  Marland Kitchen Acute renal failure 09/29/2011  . 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 HYPOTHYROIDISM 06/19/2008    Qualifier: Diagnosis of  By: Excell Seltzer CMA, Lawson Fiscal    . Renal artery stenosis     s/p stents (restenosis)  . Subclavian arterial stenosis   . Peripheral neuropathy   . Coronary artery disease   . Myocardial infarction   . Pericarditis   . Anxiety   . Depression   . Shortness of breath   . CHF (congestive heart failure)   . GERD (gastroesophageal reflux disease)   . Obstructive sleep apnea on nocturnal CPAP 06/20/2008    uses CPAP @ night  . Unstable angina   . Stroke   . Arthritis    Past Surgical History  Procedure Laterality Date  . Cardiac catheterization  01/2011  Minimal luminal irregularties  . Laparoscopic tubal ligation    . Renal artery stent      x 2 (restenosis)  . Colonoscopy  10/12/2011    Procedure: COLONOSCOPY;  Surgeon: Louis Meckel, MD;  Location: [facility] ENDOSCOPY;  Service: Endoscopy;  Laterality: N/A;  . Esophagogastroduodenoscopy   10/12/2011    Procedure: ESOPHAGOGASTRODUODENOSCOPY (EGD);  Surgeon: Louis Meckel, MD;  Location: [facility] ENDOSCOPY;  Service: Endoscopy;  Laterality: N/A;  . Tubal ligation    . Colonoscopy  10/21/2011    Procedure: COLONOSCOPY;  Surgeon: Hilarie Fredrickson, MD;  Location: [facility] ENDOSCOPY;  Service: Endoscopy;  Laterality: N/A;  . Colonoscopy  10/25/2011    Procedure: COLONOSCOPY;  Surgeon: Beverley Fiedler, MD;  Location: [facility] ENDOSCOPY;  Service: Gastroenterology;  Laterality: N/A;  . Colonoscopy  10/23/2011    Procedure: COLONOSCOPY;  Surgeon: Hilarie Fredrickson, MD;  Location: [facility] ENDOSCOPY;  Service: Endoscopy;  Laterality: N/A;  . Colon surgery    . Back surgery       x 2  . Cataract extraction w/phaco  03/29/2012    Procedure: CATARACT EXTRACTION PHACO AND INTRAOCULAR LENS PLACEMENT (IOC);  Surgeon: Gemma Payor, MD;  Location: [facility] ORS;  Service: Ophthalmology;  Laterality: Right;  CDE: 16.87  . Cataract extraction w/phaco  04/16/2012    Procedure: CATARACT EXTRACTION PHACO AND INTRAOCULAR LENS PLACEMENT (IOC);  Surgeon: Gemma Payor, MD;  Location: [facility] ORS;  Service: Ophthalmology;  Laterality: Left;  CDE:  14.98   Social History:  reports that she quit smoking about 8 years ago. Her smoking use included Cigarettes. She has a 25 pack-year smoking history. She does not have any smokeless tobacco history on file. She reports that she does not drink alcohol or use illicit drugs. Patient lives with her husband and is fairly independent with her activities of daily living Allergies  Allergen Reactions  . Codeine Anaphylaxis    swelling  . Lidocaine Anaphylaxis  . Atenolol Other (See Comments)    Unknown  . Bystolic (Nebivolol Hcl)     Cannot tolerate greater then 5 mg, causes chest pain  . Ciprofloxacin Itching  . Metformin Nausea And Vomiting  . Metoprolol Other (See Comments)    Burns ears  . Morphine And Related Other (See Comments)    Makes patient feel like she is floating  . Penicillins Rash    Family  History  Problem Relation Age of Onset  . Heart disease Mother   . Heart attack Mother   . Heart disease Father   . Heart attack Father   . Heart disease Sister   . Heart disease Daughter     Prior to Admission medications   Medication Sig Start Date End Date Taking? Authorizing Provider  ALPRAZolam (XANAX XR) 1 MG 24 hr tablet Take 1 mg by mouth daily as needed. Anxiety   Yes Historical Provider, MD  ALPRAZolam (XANAX) 1 MG tablet Take 1 mg by mouth 2 (two) times daily.   Yes Historical Provider, MD  aspirin 325 MG EC tablet Take 325 mg by mouth daily.   Yes Historical Provider, MD  Biotin 5000 MCG CAPS Take 1 capsule by mouth daily.   Yes Historical Provider, MD  Cholecalciferol (VITAMIN D) 2000 UNITS tablet Take 2,000 Units by mouth daily.   Yes Historical Provider, MD  escitalopram (LEXAPRO) 20 MG tablet Take 20 mg by mouth daily.   Yes Historical Provider, MD  furosemide (LASIX) 40 MG tablet Take 40 mg by mouth 2 (two)  times daily.   Yes Historical Provider, MD  hydrALAZINE (APRESOLINE) 50 MG tablet Take 50 mg by mouth 2 (two) times daily.    Yes Historical Provider, MD  insulin glargine (LANTUS) 100 UNIT/ML injection Inject 62 Units into the skin at bedtime.  10/30/11  Yes Calvert Cantor, MD  insulin lispro (HUMALOG) 100 UNIT/ML injection Inject 10-30 Units into the skin 3 (three) times daily before meals. Uses according to a sliding scale at home. 10 units bid and 20 at supper   Yes Historical Provider, MD  isosorbide mononitrate (IMDUR) 60 MG 24 hr tablet Take 60 mg by mouth daily.     Yes Historical Provider, MD  levothyroxine (SYNTHROID, LEVOTHROID) 112 MCG tablet Take 112 mcg by mouth daily.     Yes Historical Provider, MD  nebivolol (BYSTOLIC) 5 MG tablet Take 5 mg by mouth daily.   Yes Historical Provider, MD  Omega-3 Fatty Acids (FISH OIL TRIPLE STRENGTH PO) Take 3,000 mg by mouth 2 (two) times daily.   Yes Historical Provider, MD  omeprazole (PRILOSEC) 20 MG capsule Take 40 mg by  mouth daily.    Yes Historical Provider, MD  potassium chloride SA (K-DUR,KLOR-CON) 20 MEQ tablet Take 1 tablet (20 mEq total) by mouth daily. 10/13/11  Yes Laveda Norman, MD   Physical Exam: Filed Vitals:   12/04/12 1116 12/04/12 1150 12/04/12 1315  BP: 176/45  167/53  Pulse: 58  58  Temp: 98.4 F (36.9 C) 98.4 F (36.9 C)   Resp: 20  18  Height: 5\' 5"  (1.651 m)    Weight: 82.555 kg (182 lb)    SpO2: 99%  94%     General:  Obese no acute distress  Eyes: PERRLA, EOMI, no scleral icterus  ENT: Ears clear nose without drainage oropharynx without erythema or exudate. Neck is membranes of her mouth are pink somewhat dry  Neck: Supple no JVD full range of motion no lymphadenopathy  Cardiovascular: Regular rate and rhythm no murmur gallop or rub no lower extremity edema.  Respiratory: Normal effort breath sounds slightly distant but clear to auscultation bilaterally no rhonchi no wheeze  Abdomen: Obese soft positive bowel sounds but somewhat sluggish nontender to palpation no mass organomegaly noted  Skin: Warm and dry no rash or lesions noted  Musculoskeletal: No clubbing no cyanosis. Ms. ALT extremities joints without swelling or erythema  Psychiatric: Calm cooperative appropriate  Neurologic: Alert and oriented x3 speech clear facial symmetry cranial nerve II through XII intact. Bilateral upper extremity strength 4/5 bilateral lower extremity strength 4/5. Sensation intact  Labs on Admission:  Basic Metabolic Panel:  Recent Labs Lab 12/04/12 1200  NA 139  K 4.1  CL 100  CO2 29  GLUCOSE 170*  BUN 44*  CREATININE 1.52*  CALCIUM 9.8   Liver Function Tests:  Recent Labs Lab 12/04/12 1200  AST 39*  ALT 44*  ALKPHOS 48  BILITOT 0.3  PROT 7.6  ALBUMIN 3.6   No results found for this basename: LIPASE, AMYLASE,  in the last 168 hours No results found for this basename: AMMONIA,  in the last 168 hours CBC:  Recent Labs Lab 12/04/12 1200  WBC 7.3  NEUTROABS  4.5  HGB 13.0  HCT 38.1  MCV 90.1  PLT 234   Cardiac Enzymes:  Recent Labs Lab 12/04/12 1200  TROPONINI <0.30    BNP (last 3 results)  Recent Labs  10/13/12 2106  PROBNP 1465.0*   CBG:  Recent Labs Lab 12/04/12 1140  GLUCAP 172*    Radiological Exams on Admission: Ct Head Wo Contrast  12/04/2012   *RADIOLOGY REPORT*  Clinical Data: Altered mental status  CT HEAD WITHOUT CONTRAST  Technique:  Contiguous axial images were obtained from the base of the skull through the vertex without contrast.  Comparison: 12/16/2011  Findings: No skull fracture is noted. No intracranial hemorrhage, mass effect or midline shift.  Lacunar infarct right cerebellum is stable.  No acute cortical infarction.  No mass lesion is noted on this unenhanced scan.  Stable atrophy and chronic white matter disease.  Paranasal sinuses are unremarkable.  There is complete opacification of the right mastoid air cells.  IMPRESSION:  1.  No acute intracranial abnormality.  Stable atrophy and chronic white matter disease.  Stable lacunar infarct in the right cerebellum. Complete opacification of the right mastoid air cells.   Original Report Authenticated By: Natasha Mead, M.D.    EKG: Independently reviewed. Sinus bradycardia at rate of 57 ST and T wave abnormal  Assessment/Plan Principal Problem:   TIA (transient ischemic attack): Will admit to telemetry to monitor for any arrhythmias. Will get MRI of her brain once IV conscious sedation can be obtained, will check carotid Dopplers TSH hemoglobin A1c lipid panel. Hold antihypertensives for now to allow for permissive hypertension. Symptoms have resolved at time of my examination. Will request PT evaluation. Active Problems:  Acute on chronic renal failure. Current creatinine is 1.5 chart review indicates baseline is closer 1.3. Stage III chronic kidney disease. Will hold any nephrotoxins for now. Will encourage by mouth fluids. Recheck in the a.m.  Pulmonary  HTN-severe- due to diastolic dysfunction: Appears to be at baseline. Will monitor oxygen saturation level intake and output daily weights. Anticipate resuming meds tomorrow.    Normocytic anemia: Hg currently 12.2 which is close to her baseline. No Signs symptoms of bleeding.    Diabetes mellitus: Will check hemoglobin A1c. Will continue Lantus at one third her home dose. Will use sliding scale insulin for optimal glycemic control. On admission serum glucose is 170    Chronic renal insufficiency, stage III (moderate): See #2     HYPOTHYROIDISM: We'll check her TSH. Continue Synthroid    HYPERLIPIDEMIA, hx of statin induced myosistis: Will check a lipid panel continue her statin    VERTIGO: Stable at baseline   Obstructive sleep apnea on nocturnal CPAP: Request respiratory therapy consult      Code Status: Full Family Communication: Husband at bedside Disposition Plan: Home when ready hopefully tomorrow  Time spent: 70 minutes  Gwenyth Bender [facility] Pager [phone]  If 7PM-7AM, please contact night-coverage [email or web address] Password [facility] 12/04/2012, 3:35 PM Attending: Patient seen and examined. The patient describes as proximally 3 hour history of confusion. There was never any limb weakness. She has no focal neurological signs at the present time. I spoke this could possibly be a TIA. I would recommend addition of Plavix in addition to her aspirin. Neurology consultation would be useful. I would not rush to do an MRI brain scan at this point in time.

## 2012-12-04 NOTE — ED Notes (Signed)
Pt reports cbg was 89 and then she ate a bowl of oatmeal.

## 2012-12-04 NOTE — Telephone Encounter (Signed)
Returning call from yesterday about test results-could not understand her name!i

## 2012-12-04 NOTE — ED Notes (Addendum)
Pt reports woke up this morning around 0730 and felt very disoriented and weak.  ALso reports dizziness and headache.  Pt reports felt normal when she went to bed last night.  Pt went to Dr. Lorenso Courier office this am and was sent here for evaluation.  BP in office was 210/80.

## 2012-12-04 NOTE — Telephone Encounter (Signed)
[name] is calling wanting to know the results of fher mother doppler . Her mom is in the hospital for memory loss and the hospital is asking for her for the results.Marland Kitchen

## 2012-12-04 NOTE — ED Notes (Signed)
Called to give report to RN, she to call me back

## 2012-12-04 NOTE — ED Provider Notes (Signed)
History  This chart was scribed for Joya Gaskins, MD by Bennett Scrape, ED Scribe. This patient was seen in room [facility] and the patient's care was started at 11:43 AM.  CSN: [id number] Arrival date & time 12/04/12  1103  First MD Initiated Contact with Patient 12/04/12 1143     Chief Complaint  Patient presents with  . Altered Mental Status    Patient is a 67 y.o. female presenting with altered mental status. The history is provided by the patient. No language interpreter was used.  Altered Mental Status Presenting symptoms: confusion, disorientation and memory loss   Severity:  Moderate Most recent episode:  Today Episode history:  Single Duration:  1 hour Timing:  Constant Progression:  Resolved Chronicity:  New Associated symptoms: headaches and weakness   Associated symptoms: no abdominal pain, no fever, no nausea and no vomiting     HPI Comments: [name] is a 67 y.o. female who presents to the Emergency Department complaining of one hour of now resolved AMS described as decreased memory and confusion. Pt states that she woke up this morning and didn't know where she was. She reports right arm and right leg weakness from a prior CVA and she admits that the right leg feels weaker than her baseline currently. She admits; however, that she has not tested her strength today or ambulated as much as normal due to dizziness. She reports associated dizziness, HA and blurred vision that she attributes to HTN this morning. She denies having symptoms last night. She was seen by her PCP this morning and was sent here for evaluation. BP in office was 210/80 and was 176/45 in the ED. She denies any recent falls or head injuries. Pt denies fevers, emesis, CP, nausea and emesis as associated symptoms.    Past Medical History  Diagnosis Date  . HTN (hypertension)   . Elevated troponin level 09/28/11    Type II MI - not ACS related  . Diastolic dysfunction, left ventricle      with previous Diastolic HF  . Diabetes mellitus   . High cholesterol   . Kidney disease   . Cataracts, bilateral   . 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 Pulmonary HTN-severe- due to diastolic dysfunction 09/29/2011    Moderate to Severe Pulm HTN.  Marland Kitchen Acute renal failure 09/29/2011  . 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 HYPOTHYROIDISM 06/19/2008    Qualifier: Diagnosis of  By: Excell Seltzer CMA, Lawson Fiscal    . Renal artery stenosis     s/p stents (restenosis)  . Subclavian arterial stenosis   . Peripheral neuropathy   . Coronary artery disease   . Myocardial infarction   . Pericarditis   . Anxiety   . Depression   . Shortness of breath   . CHF (congestive heart failure)   . GERD (gastroesophageal reflux disease)   . Obstructive sleep apnea on nocturnal CPAP 06/20/2008    uses CPAP @ night  . Unstable angina   . Stroke   . Arthritis    Past Surgical History  Procedure Laterality Date  . Cardiac catheterization  01/2011    Minimal luminal irregularties  . Laparoscopic tubal ligation    . Renal artery stent      x 2 (restenosis)  . Colonoscopy  10/12/2011    Procedure: COLONOSCOPY;  Surgeon: Louis Meckel, MD;  Location: [facility] ENDOSCOPY;  Service: Endoscopy;  Laterality: N/A;  . Esophagogastroduodenoscopy  10/12/2011  Procedure: ESOPHAGOGASTRODUODENOSCOPY (EGD);  Surgeon: Louis Meckel, MD;  Location: [facility] ENDOSCOPY;  Service: Endoscopy;  Laterality: N/A;  . Tubal ligation    . Colonoscopy  10/21/2011    Procedure: COLONOSCOPY;  Surgeon: Hilarie Fredrickson, MD;  Location: [facility] ENDOSCOPY;  Service: Endoscopy;  Laterality: N/A;  . Colonoscopy  10/25/2011    Procedure: COLONOSCOPY;  Surgeon: Beverley Fiedler, MD;  Location: [facility] ENDOSCOPY;  Service: Gastroenterology;  Laterality: N/A;  . Colonoscopy  10/23/2011    Procedure: COLONOSCOPY;  Surgeon: Hilarie Fredrickson, MD;  Location: [facility] ENDOSCOPY;  Service: Endoscopy;  Laterality: N/A;  .  Colon surgery    . Back surgery       x 2  . Cataract extraction w/phaco  03/29/2012    Procedure: CATARACT EXTRACTION PHACO AND INTRAOCULAR LENS PLACEMENT (IOC);  Surgeon: Gemma Payor, MD;  Location: [facility] ORS;  Service: Ophthalmology;  Laterality: Right;  CDE: 16.87  . Cataract extraction w/phaco  04/16/2012    Procedure: CATARACT EXTRACTION PHACO AND INTRAOCULAR LENS PLACEMENT (IOC);  Surgeon: Gemma Payor, MD;  Location: [facility] ORS;  Service: Ophthalmology;  Laterality: Left;  CDE:  14.98   Family History  Problem Relation Age of Onset  . Heart disease Mother   . Heart attack Mother   . Heart disease Father   . Heart attack Father   . Heart disease Sister   . Heart disease Daughter    History  Substance Use Topics  . Smoking status: Former Smoker -- 1.00 packs/day for 25 years    Types: Cigarettes    Quit date: 05/23/2004  . Smokeless tobacco: Not on file  . Alcohol Use: No   No OB history provided.   Review of Systems  Constitutional: Negative for fever.  HENT: Positive for trouble swallowing (feels "strangled" x 2 days).   Respiratory: Positive for shortness of breath (chronic).   Gastrointestinal: Negative for nausea, vomiting, abdominal pain and diarrhea.  Neurological: Positive for dizziness, weakness and headaches. Negative for syncope.  Psychiatric/Behavioral: Positive for memory loss, confusion and altered mental status.  All other systems reviewed and are negative.    Allergies  Codeine; Lidocaine; Atenolol; Bystolic; Ciprofloxacin; Metformin; Metoprolol; Morphine and related; and Penicillins  Home Medications   Current Outpatient Rx  Name  Route  Sig  Dispense  Refill  . ALPRAZolam (XANAX) 0.5 MG tablet   Oral   Take 0.5 mg by mouth at bedtime as needed. For anxiety         . Biotin 5000 MCG CAPS   Oral   Take 1 capsule by mouth daily.         Marland Kitchen escitalopram (LEXAPRO) 20 MG tablet   Oral   Take 20 mg by mouth daily.         . fish oil-omega-3 fatty  acids 1000 MG capsule   Oral   Take 1 g by mouth 2 (two) times daily.         . furosemide (LASIX) 40 MG tablet   Oral   Take 40 mg by mouth 2 (two) times daily.         . hydrALAZINE (APRESOLINE) 50 MG tablet   Oral   Take 50 mg by mouth 3 (three) times daily.         . insulin glargine (LANTUS) 100 UNIT/ML injection   Subcutaneous   Inject 62 Units into the skin at bedtime.          . insulin lispro (HUMALOG) 100 UNIT/ML injection  Subcutaneous   Inject 10-30 Units into the skin 3 (three) times daily before meals. Uses according to a sliding scale at home.         . isosorbide mononitrate (IMDUR) 60 MG 24 hr tablet   Oral   Take 60 mg by mouth daily.           Marland Kitchen levothyroxine (SYNTHROID, LEVOTHROID) 112 MCG tablet   Oral   Take 112 mcg by mouth daily.           . nebivolol (BYSTOLIC) 2.5 MG tablet   Oral   Take 2.5 mg by mouth daily.           Marland Kitchen omeprazole (PRILOSEC) 20 MG capsule   Oral   Take 40 mg by mouth daily.          . potassium chloride SA (K-DUR,KLOR-CON) 20 MEQ tablet   Oral   Take 1 tablet (20 mEq total) by mouth daily.   30 tablet   0    Triage Vitals: BP 176/45  Pulse 58  Temp(Src) 98.4 F (36.9 C)  Resp 20  Ht 5\' 5"  (1.651 m)  Wt 182 lb (82.555 kg)  BMI 30.29 kg/m2  SpO2 99%  Physical Exam  Nursing note and vitals reviewed.  CONSTITUTIONAL: Well developed/well nourished HEAD: Normocephalic/atraumatic EYES: EOMI/PERRL, no nystagmus ENMT: Mucous membranes moist NECK: supple no meningeal signs SPINE:entire spine nontender CV: S1/S2 noted, no murmurs/rubs/gallops noted LUNGS: Lungs are clear to auscultation bilaterally, no apparent distress ABDOMEN: soft, nontender, no rebound or guarding GU:no cva tenderness NEURO: Pt is awake/alert, moves all extremitiesx4, right leg drift noted, no arm drift, no facial droop, no dysarthria or aphasia noted EXTREMITIES: pulses normal, full ROM SKIN: warm, color normal PSYCH: no  abnormalities of mood noted  ED Course  Procedures   DIAGNOSTIC STUDIES: Oxygen Saturation is 99% on room air, normal by my interpretation.    COORDINATION OF CARE: 12:05 PM-Discussed treatment plan which includes CT of head, CBC panel, CMP and UA with pt at bedside and pt agreed to plan.  12:07 PM-tPA in stroke considered but not given due to:  Onset over 3-4.5hours   1:40 PM Pt feeling improved She thinks her right LE has worsened weakness from baseline (denies new back pain, denies leg pain, distal pulses intact) Otherwise pt is stable and in no distress Possible TIA given her symptoms are resolving, though occult CVA is not ruled out I spoke to hospitalist who will admit for dr Renard Matter MDM  Nursing notes including past medical history and social history reviewed and considered in documentation Labs/vital reviewed and considered       Date: 12/04/2012  Rate: 57  Rhythm: sinus bradycardia  QRS Axis: normal  Intervals: normal  ST/T Wave abnormalities: nonspecific ST changes  Conduction Disutrbances:nonspecific intraventricular conduction delay  Narrative Interpretation: LVH noted  Old EKG Reviewed: unchanged    I personally performed the services described in this documentation, which was scribed in my presence. The recorded information has been reviewed and is accurate.      Joya Gaskins, MD 12/04/12 1341

## 2012-12-04 NOTE — Progress Notes (Signed)
Gave and went over educational print outs on stroke with pt and husband. Pt currently has no questions. Will continue to monitor.

## 2012-12-04 NOTE — Telephone Encounter (Signed)
I spoke with [name], [name] is currently in [facility] ER.  I gave lower extremity dopplers results and she will call us back to schedule office visit with Dr Allyson Sabal when out of hospital

## 2012-12-05 ENCOUNTER — Observation Stay (HOSPITAL_COMMUNITY): Payer: Medicare Other

## 2012-12-05 DIAGNOSIS — I359 Nonrheumatic aortic valve disorder, unspecified: Secondary | ICD-10-CM

## 2012-12-05 LAB — GLUCOSE, CAPILLARY
Glucose-Capillary: 205 mg/dL — ABNORMAL HIGH (ref 70–99)
Glucose-Capillary: 158 mg/dL — ABNORMAL HIGH (ref 70–99)

## 2012-12-05 LAB — HEMOGLOBIN A1C: Hgb A1c MFr Bld: 6.7 % — ABNORMAL HIGH (ref <5.7)

## 2012-12-05 LAB — LIPID PANEL
LDL Cholesterol: UNDETERMINED mg/dL (ref 0–99)
Triglycerides: 427 mg/dL — ABNORMAL HIGH (ref <150)
VLDL: UNDETERMINED mg/dL (ref 0–40)

## 2012-12-05 MED ORDER — CLONIDINE HCL 0.1 MG PO TABS
0.1000 mg | ORAL_TABLET | ORAL | Status: AC
  Administered 2012-12-05: 0.1 mg via ORAL

## 2012-12-05 MED ORDER — CLONIDINE HCL 0.1 MG PO TABS
0.1000 mg | ORAL_TABLET | ORAL | Status: DC | PRN
  Administered 2012-12-05 – 2012-12-07 (×7): 0.1 mg via ORAL
  Filled 2012-12-05 (×8): qty 1

## 2012-12-05 NOTE — Evaluation (Signed)
Physical Therapy Evaluation Patient Details Name: [name] MRN: [id number] DOB: [birth year] Today's Date: 12/05/2012 Time: 6962-9528 PT Time Calculation (min): 42 min  PT Assessment / Plan / Recommendation History of Present Illness   Pt is a 67 yo female with past hx of CVA admitted on 12/04/2012 for TIA.  Clinical Impression  Pt strength is equal B.  Pt is at prior functional level who is not in need of physical therapy.    PT Assessment  Patent does not need any further PT services    Follow Up Recommendations  No PT follow up    Does the patient have the potential to tolerate intense rehabilitation    na  Barriers to Discharge   none       Equipment Recommendations       Recommendations for Other Services     Frequency   n?A   Precautions / Restrictions Precautions Precautions: None Restrictions Weight Bearing Restrictions: No   Pertinent Vitals/Pain none      Mobility  Bed Mobility Bed Mobility: Supine to Sit Supine to Sit: 7: Independent Transfers Transfers: Sit to Stand Ambulation/Gait Ambulation/Gait Assistance: 7: Independent Ambulation Distance (Feet): 200 Feet Assistive device: None Gait Pattern: Within Functional Limits Stairs: Yes Stairs Assistance: 6: Modified independent (Device/Increase time) Stair Management Technique: One rail Right Number of Stairs: 5    Exercises General Exercises - Lower Extremity Long Arc Quad: Both;5 reps Hip ABduction/ADduction: Both;5 reps Straight Leg Raises: Both;5 reps   PT Diagnosis:   NONE PT Problem List:  NONE PT Treatment Interventions:       PT Goals(Current goals can be found in the care plan section)    Visit Information  Last PT Received On: 12/05/12       Prior Functioning  Home Living Family/patient expects to be discharged to:: Private residence Living Arrangements: Spouse/significant other Available Help at Discharge: Family Type of Home: House Home Access: Stairs to  enter Secretary/administrator of Steps: 2 Home Equipment: None Prior Function Level of Independence: Independent Communication Communication: No difficulties Dominant Hand: Right    Cognition  Cognition Arousal/Alertness: Awake/alert    Extremity/Trunk Assessment Lower Extremity Assessment Lower Extremity Assessment: Overall WFL for tasks assessed   Balance    End of Session PT - End of Session Equipment Utilized During Treatment: Gait belt Activity Tolerance: Patient tolerated treatment well Patient left: in chair  GP     Dion Parrow,CINDY 12/05/2012, 9:52 AM

## 2012-12-05 NOTE — Progress Notes (Addendum)
Pt was complaining of "burning ears" and stated that that often happens when her BP is high. Checked BP and it was in the 200s/60s. Gave pt 0.1mg  of clonidine. Rechecked her BP an hour later and BP was still 200s/60s. MD was called and he ordered an additional 0.1mg  of Clonidine now. Will recheck BP in an hour. Will continue to monitor.   1805: Pt BP now 148/55

## 2012-12-05 NOTE — Progress Notes (Signed)
Utilization Review Complete  

## 2012-12-05 NOTE — Consult Note (Signed)
[facility] Aluel Schwarz A. Gerilyn Pilgrim, MD     [email or web address]          [name] is an 67 y.o. female.   ASSESSMENT/PLAN: Acute mental status changes of unclear etiology. Workup so far has been  negative including imaging and metabolic workup. Additional testing  should include an EEG. I also recommend dementia labs such as thyroid  function tests and vitamin B12 level. These will be written for. It  appears she has not had an acute infarct at least on imaging done so  far. She therefore can be maintained on the same antiplatelet agents as  she previously was taking which is aspirin.   HISTORY: This 67 year old white female, who is known to be quite  confused, disoriented by her husband. The patient admits to having very  little information as to why she is in the hospital. She cannot provide  any adequate history. She thinks she may have had a stroke. The  confusion and unresponsiveness improved significantly and she is  apparently about at baseline. She tells me she had a previous stroke  with numbness and tingling along with pain on the right side. She did  have residual numbness on the right side and some impairment of hand  dexterity. She does not complain of any problems at this time other  than the right-sided numbness. She denies any chest pain, shortness of  breath, headaches, focal weakness, dysarthria, GI/GU symptoms.  PHYSICAL EXAMINATION: GENERAL: A pleasant overweight lady, in no acute  distress.  HEENT: Head is normocephalic, atraumatic.  NECK: Supple.  ABDOMEN: Soft.  EXTREMITIES: No significant edema.  NEUROLOGIC: Mentation: She is awake. She knows she is in the  hospital. Again she cannot provide a good history as to why she is in  the hospital. She speaks in full sentences and is generally lucid. No  dysarthria is observed. Cranial nerve evaluation, pupils are equal,  round, reactive to light and accommodation. Extraocular movements are  full.  Facial and muscle strength is symmetric. Tongue is midline.  Uvula midline. Motor examination shows no pronator drift. She has good  strength throughout. Bulk and tone are also normal. Reflexes are 2+.  Plantars are equivocal on the right and down on the left. Sensation is  normal to light touch and temperature.     Past Medical History  Diagnosis Date  . HTN (hypertension)   . Elevated troponin level 09/28/11    Type II MI - not ACS related  . Diastolic dysfunction, left ventricle     with previous Diastolic HF  . Diabetes mellitus   . High cholesterol   . Kidney disease   . Cataracts, bilateral   . 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 Pulmonary HTN-severe- due to diastolic dysfunction 09/29/2011    Moderate to Severe Pulm HTN.  Marland Kitchen Acute renal failure 09/29/2011  . 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 HYPOTHYROIDISM 06/19/2008    Qualifier: Diagnosis of  By: Excell Seltzer CMA, Lawson Fiscal    . Renal artery stenosis     s/p stents (restenosis)  . Subclavian arterial stenosis   . Peripheral neuropathy   . Coronary artery disease   . Myocardial infarction   . Pericarditis   . Anxiety   . Depression   . Shortness of breath   . CHF (congestive heart failure)   . GERD (gastroesophageal reflux disease)   . Obstructive sleep apnea on nocturnal CPAP 06/20/2008    uses CPAP @  night  . Unstable angina   . Stroke   . Arthritis     Past Surgical History  Procedure Laterality Date  . Cardiac catheterization  01/2011    Minimal luminal irregularties  . Laparoscopic tubal ligation    . Renal artery stent      x 2 (restenosis)  . Colonoscopy  10/12/2011    Procedure: COLONOSCOPY;  Surgeon: Louis Meckel, MD;  Location: [facility] ENDOSCOPY;  Service: Endoscopy;  Laterality: N/A;  . Esophagogastroduodenoscopy  10/12/2011    Procedure: ESOPHAGOGASTRODUODENOSCOPY (EGD);  Surgeon: Louis Meckel, MD;  Location: [facility] ENDOSCOPY;   Service: Endoscopy;  Laterality: N/A;  . Tubal ligation    . Colonoscopy  10/21/2011    Procedure: COLONOSCOPY;  Surgeon: Hilarie Fredrickson, MD;  Location: [facility] ENDOSCOPY;  Service: Endoscopy;  Laterality: N/A;  . Colonoscopy  10/25/2011    Procedure: COLONOSCOPY;  Surgeon: Beverley Fiedler, MD;  Location: [facility] ENDOSCOPY;  Service: Gastroenterology;  Laterality: N/A;  . Colonoscopy  10/23/2011    Procedure: COLONOSCOPY;  Surgeon: Hilarie Fredrickson, MD;  Location: [facility] ENDOSCOPY;  Service: Endoscopy;  Laterality: N/A;  . Colon surgery    . Back surgery       x 2  . Cataract extraction w/phaco  03/29/2012    Procedure: CATARACT EXTRACTION PHACO AND INTRAOCULAR LENS PLACEMENT (IOC);  Surgeon: Gemma Payor, MD;  Location: [facility] ORS;  Service: Ophthalmology;  Laterality: Right;  CDE: 16.87  . Cataract extraction w/phaco  04/16/2012    Procedure: CATARACT EXTRACTION PHACO AND INTRAOCULAR LENS PLACEMENT (IOC);  Surgeon: Gemma Payor, MD;  Location: [facility] ORS;  Service: Ophthalmology;  Laterality: Left;  CDE:  14.98    Family History  Problem Relation Age of Onset  . Heart disease Mother   . Heart attack Mother   . Heart disease Father   . Heart attack Father   . Heart disease Sister   . Heart disease Daughter     Social History:  reports that she quit smoking about 8 years ago. Her smoking use included Cigarettes. She has a 25 pack-year smoking history. She does not have any smokeless tobacco history on file. She reports that she does not drink alcohol or use illicit drugs.  Allergies:  Allergies  Allergen Reactions  . Codeine Anaphylaxis    swelling  . Lidocaine Anaphylaxis  . Atenolol Other (See Comments)    Unknown  . Bystolic (Nebivolol Hcl)     Cannot tolerate greater then 5 mg, causes chest pain  . Ciprofloxacin Itching  . Metformin Nausea And Vomiting  . Metoprolol Other (See Comments)    Burns ears  . Morphine And Related Other (See Comments)    Makes patient feel like she is floating  . Penicillins Rash      Medications: Prior to Admission medications   Medication Sig Start Date End Date Taking? Authorizing Provider  ALPRAZolam (XANAX XR) 1 MG 24 hr tablet Take 1 mg by mouth daily as needed. Anxiety   Yes Historical Provider, MD  ALPRAZolam (XANAX) 1 MG tablet Take 1 mg by mouth 2 (two) times daily.   Yes Historical Provider, MD  aspirin 325 MG EC tablet Take 325 mg by mouth daily.   Yes Historical Provider, MD  Biotin 5000 MCG CAPS Take 1 capsule by mouth daily.   Yes Historical Provider, MD  Cholecalciferol (VITAMIN D) 2000 UNITS tablet Take 2,000 Units by mouth daily.   Yes Historical Provider, MD  escitalopram (LEXAPRO) 20 MG tablet Take  20 mg by mouth daily.   Yes Historical Provider, MD  furosemide (LASIX) 40 MG tablet Take 40 mg by mouth 2 (two) times daily.   Yes Historical Provider, MD  hydrALAZINE (APRESOLINE) 50 MG tablet Take 50 mg by mouth 2 (two) times daily.    Yes Historical Provider, MD  insulin glargine (LANTUS) 100 UNIT/ML injection Inject 62 Units into the skin at bedtime.  10/30/11  Yes Calvert Cantor, MD  insulin lispro (HUMALOG) 100 UNIT/ML injection Inject 10-30 Units into the skin 3 (three) times daily before meals. Uses according to a sliding scale at home. 10 units bid and 20 at supper   Yes Historical Provider, MD  isosorbide mononitrate (IMDUR) 60 MG 24 hr tablet Take 60 mg by mouth daily.     Yes Historical Provider, MD  levothyroxine (SYNTHROID, LEVOTHROID) 112 MCG tablet Take 112 mcg by mouth daily.     Yes Historical Provider, MD  nebivolol (BYSTOLIC) 5 MG tablet Take 5 mg by mouth daily.   Yes Historical Provider, MD  Omega-3 Fatty Acids (FISH OIL TRIPLE STRENGTH PO) Take 3,000 mg by mouth 2 (two) times daily.   Yes Historical Provider, MD  omeprazole (PRILOSEC) 20 MG capsule Take 40 mg by mouth daily.    Yes Historical Provider, MD  potassium chloride SA (K-DUR,KLOR-CON) 20 MEQ tablet Take 1 tablet (20 mEq total) by mouth daily. 10/13/11  Yes Laveda Norman, MD     Scheduled Meds: . ALPRAZolam  1 mg Oral BID  . aspirin  325 mg Oral Daily  . cholecalciferol  2,000 Units Oral Daily  . clopidogrel  75 mg Oral Q breakfast  . enoxaparin (LOVENOX) injection  40 mg Subcutaneous Q24H  . escitalopram  20 mg Oral Daily  . insulin aspart  0-15 Units Subcutaneous TID WC  . insulin glargine  20 Units Subcutaneous QHS  . isosorbide mononitrate  60 mg Oral Daily  . levothyroxine  112 mcg Oral QAC breakfast  . pantoprazole  40 mg Oral Daily   Continuous Infusions:  PRN Meds:.acetaminophen, cloNIDine   Blood pressure 148/55, pulse 55, temperature 98 F (36.7 C), temperature source Oral, resp. rate 18, height 5\' 5"  (1.651 m), weight 82.696 kg (182 lb 5 oz), SpO2 97.00%.   Results for orders placed during the hospital encounter of 12/04/12 (from the past 48 hour(s))  GLUCOSE, CAPILLARY     Status: Abnormal   Collection Time    12/04/12 11:40 AM      Result Value Range   Glucose-Capillary 172 (*) 70 - 99 mg/dL  ETHANOL     Status: None   Collection Time    12/04/12 12:00 PM      Result Value Range   Alcohol, Ethyl (B) <11  0 - 11 mg/dL   Comment:            LOWEST DETECTABLE LIMIT FOR     SERUM ALCOHOL IS 11 mg/dL     FOR MEDICAL PURPOSES ONLY  PROTIME-INR     Status: None   Collection Time    12/04/12 12:00 PM      Result Value Range   Prothrombin Time 12.7  11.6 - 15.2 seconds   INR 0.97  0.00 - 1.49  APTT     Status: None   Collection Time    12/04/12 12:00 PM      Result Value Range   aPTT 26  24 - 37 seconds  CBC     Status: None  Collection Time    12/04/12 12:00 PM      Result Value Range   WBC 7.3  4.0 - 10.5 K/uL   RBC 4.23  3.87 - 5.11 MIL/uL   Hemoglobin 13.0  12.0 - 15.0 g/dL   HCT 78.2  95.6 - 21.3 %   MCV 90.1  78.0 - 100.0 fL   MCH 30.7  26.0 - 34.0 pg   MCHC 34.1  30.0 - 36.0 g/dL   RDW 08.6  57.8 - 46.9 %   Platelets 234  150 - 400 K/uL  DIFFERENTIAL     Status: None   Collection Time    12/04/12 12:00 PM       Result Value Range   Neutrophils Relative % 62  43 - 77 %   Neutro Abs 4.5  1.7 - 7.7 K/uL   Lymphocytes Relative 24  12 - 46 %   Lymphs Abs 1.8  0.7 - 4.0 K/uL   Monocytes Relative 9  3 - 12 %   Monocytes Absolute 0.6  0.1 - 1.0 K/uL   Eosinophils Relative 5  0 - 5 %   Eosinophils Absolute 0.3  0.0 - 0.7 K/uL   Basophils Relative 1  0 - 1 %   Basophils Absolute 0.1  0.0 - 0.1 K/uL  COMPREHENSIVE METABOLIC PANEL     Status: Abnormal   Collection Time    12/04/12 12:00 PM      Result Value Range   Sodium 139  135 - 145 mEq/L   Potassium 4.1  3.5 - 5.1 mEq/L   Chloride 100  96 - 112 mEq/L   CO2 29  19 - 32 mEq/L   Glucose, Bld 170 (*) 70 - 99 mg/dL   BUN 44 (*) 6 - 23 mg/dL   Creatinine, Ser 6.29 (*) 0.50 - 1.10 mg/dL   Calcium 9.8  8.4 - 52.8 mg/dL   Total Protein 7.6  6.0 - 8.3 g/dL   Albumin 3.6  3.5 - 5.2 g/dL   AST 39 (*) 0 - 37 U/L   ALT 44 (*) 0 - 35 U/L   Alkaline Phosphatase 48  39 - 117 U/L   Total Bilirubin 0.3  0.3 - 1.2 mg/dL   GFR calc non Af Amer 35 (*) >90 mL/min   GFR calc Af Amer 40 (*) >90 mL/min   Comment:            The eGFR has been calculated     using the CKD EPI equation.     This calculation has not been     validated in all clinical     situations.     eGFR's persistently     <90 mL/min signify     possible Chronic Kidney Disease.  TROPONIN I     Status: None   Collection Time    12/04/12 12:00 PM      Result Value Range   Troponin I <0.30  <0.30 ng/mL   Comment:            Due to the release kinetics of cTnI,     a negative result within the first hours     of the onset of symptoms does not rule out     myocardial infarction with certainty.     If myocardial infarction is still suspected,     repeat the test at appropriate intervals.  HEMOGLOBIN A1C     Status: Abnormal   Collection Time  12/04/12 12:00 PM      Result Value Range   Hemoglobin A1C 6.7 (*) <5.7 %   Comment: (NOTE)                                                                                According to the ADA Clinical Practice Recommendations for 2011, when     HbA1c is used as a screening test:      >=6.5%   Diagnostic of Diabetes Mellitus               (if abnormal result is confirmed)     5.7-6.4%   Increased risk of developing Diabetes Mellitus     References:Diagnosis and Classification of Diabetes Mellitus,Diabetes     Care,2011,34(Suppl 1):S62-S69 and Standards of Medical Care in             Diabetes - 2011,Diabetes Care,2011,34 (Suppl 1):S11-S61.   Mean Plasma Glucose 146 (*) <117 mg/dL  URINE RAPID DRUG SCREEN (HOSP PERFORMED)     Status: None   Collection Time    12/04/12 12:21 PM      Result Value Range   Opiates NONE DETECTED  NONE DETECTED   Cocaine NONE DETECTED  NONE DETECTED   Benzodiazepines NONE DETECTED  NONE DETECTED   Amphetamines NONE DETECTED  NONE DETECTED   Tetrahydrocannabinol NONE DETECTED  NONE DETECTED   Barbiturates NONE DETECTED  NONE DETECTED   Comment:            DRUG SCREEN FOR MEDICAL PURPOSES     ONLY.  IF CONFIRMATION IS NEEDED     FOR ANY PURPOSE, NOTIFY LAB     WITHIN 5 DAYS.                LOWEST DETECTABLE LIMITS     FOR URINE DRUG SCREEN     Drug Class       Cutoff (ng/mL)     Amphetamine      1000     Barbiturate      200     Benzodiazepine   200     Tricyclics       300     Opiates          300     Cocaine          300     THC              50  URINALYSIS, ROUTINE W REFLEX MICROSCOPIC     Status: Abnormal   Collection Time    12/04/12 12:21 PM      Result Value Range   Color, Urine YELLOW  YELLOW   APPearance CLEAR  CLEAR   Specific Gravity, Urine 1.015  1.005 - 1.030   pH 7.5  5.0 - 8.0   Glucose, UA NEGATIVE  NEGATIVE mg/dL   Hgb urine dipstick NEGATIVE  NEGATIVE   Bilirubin Urine NEGATIVE  NEGATIVE   Ketones, ur NEGATIVE  NEGATIVE mg/dL   Protein, ur 30 (*) NEGATIVE mg/dL   Urobilinogen, UA 0.2  0.0 - 1.0 mg/dL   Nitrite NEGATIVE  NEGATIVE   Leukocytes, UA NEGATIVE  NEGATIVE    URINE MICROSCOPIC-ADD ON  Status: None   Collection Time    12/04/12 12:21 PM      Result Value Range   Squamous Epithelial / LPF RARE  RARE   WBC, UA 0-2  <3 WBC/hpf   RBC / HPF 0-2  <3 RBC/hpf  GLUCOSE, CAPILLARY     Status: None   Collection Time    12/04/12  5:38 PM      Result Value Range   Glucose-Capillary 85  70 - 99 mg/dL   Comment 1 Notify RN    GLUCOSE, CAPILLARY     Status: Abnormal   Collection Time    12/04/12 10:13 PM      Result Value Range   Glucose-Capillary 111 (*) 70 - 99 mg/dL  LIPID PANEL     Status: Abnormal   Collection Time    12/05/12  5:49 AM      Result Value Range   Cholesterol 304 (*) 0 - 200 mg/dL   Triglycerides 161 (*) <150 mg/dL   HDL 38 (*) >09 mg/dL   Total CHOL/HDL Ratio 8.0     VLDL UNABLE TO CALCULATE IF TRIGLYCERIDE OVER 400 mg/dL  0 - 40 mg/dL   LDL Cholesterol UNABLE TO CALCULATE IF TRIGLYCERIDE OVER 400 mg/dL  0 - 99 mg/dL   Comment:            Total Cholesterol/HDL:CHD Risk     Coronary Heart Disease Risk Table                         Men   Women      1/2 Average Risk   3.4   3.3      Average Risk       5.0   4.4      2 X Average Risk   9.6   7.1      3 X Average Risk  23.4   11.0                Use the calculated Patient Ratio     above and the CHD Risk Table     to determine the patient's CHD Risk.                ATP III CLASSIFICATION (LDL):      <100     mg/dL   Optimal      604-540  mg/dL   Near or Above                        Optimal      130-159  mg/dL   Borderline      981-191  mg/dL   High      >478     mg/dL   Very High  GLUCOSE, CAPILLARY     Status: Abnormal   Collection Time    12/05/12  7:37 AM      Result Value Range   Glucose-Capillary 123 (*) 70 - 99 mg/dL   Comment 1 Notify RN     Comment 2 Documented in Chart    GLUCOSE, CAPILLARY     Status: Abnormal   Collection Time    12/05/12 11:36 AM      Result Value Range   Glucose-Capillary 196 (*) 70 - 99 mg/dL   Comment 1 Notify RN     Comment 2  Documented in Chart    GLUCOSE, CAPILLARY     Status: Abnormal   Collection  Time    12/05/12  4:27 PM      Result Value Range   Glucose-Capillary 205 (*) 70 - 99 mg/dL   Comment 1 Notify RN     Comment 2 Documented in Chart      Ct Head Wo Contrast  12/04/2012   *RADIOLOGY REPORT*  Clinical Data: Altered mental status  CT HEAD WITHOUT CONTRAST  Technique:  Contiguous axial images were obtained from the base of the skull through the vertex without contrast.  Comparison: 12/16/2011  Findings: No skull fracture is noted. No intracranial hemorrhage, mass effect or midline shift.  Lacunar infarct right cerebellum is stable.  No acute cortical infarction.  No mass lesion is noted on this unenhanced scan.  Stable atrophy and chronic white matter disease.  Paranasal sinuses are unremarkable.  There is complete opacification of the right mastoid air cells.  IMPRESSION:  1.  No acute intracranial abnormality.  Stable atrophy and chronic white matter disease.  Stable lacunar infarct in the right cerebellum. Complete opacification of the right mastoid air cells.   Original Report Authenticated By: Natasha Mead, M.D.        Jaslyne Beeck A. Gerilyn Pilgrim, M.D.  Diplomate, Biomedical engineer of Psychiatry and Neurology ( Neurology). 12/05/2012, 8:12 PM

## 2012-12-05 NOTE — Progress Notes (Signed)
*  PRELIMINARY RESULTS* Echocardiogram 2D Echocardiogram has been performed.  Conrad Como 12/05/2012, 12:50 PM

## 2012-12-05 NOTE — Progress Notes (Signed)
[name], [name] NO.:  [id number]  MEDICAL RECORD NO.:  [id number]  LOCATION:  A324                          FACILITY:  [facility]  PHYSICIAN:  Merlyn Bollen G. Renard Matter, MD   DATE OF BIRTH:  05/25/1945  DATE OF PROCEDURE: DATE OF DISCHARGE:                                PROGRESS NOTE   This patient is more alert today.  She was admitted with episode of loss of memory, which came on acutely.  She did not know where when she awakened from sleep.  She does have some right arm and right leg weakness from previous CVA.  She was admitted with what was felt to be TIA.  CT of the head showed no acute intracranial abnormality, stable atrophy, and chronic white matter disease, stable lacunar infarct in the right cerebellum.  She is alert this morning.  OBJECTIVE:  VITAL SIGNS:  Blood pressure 186/48, respirations 18, pulse 72, temp 98.1. HEENT:  Eyes, PERRLA.  TMs negative.  Oropharynx benign. NECK:  Supple.  No JVD or thyroid abnormalities. HEART:  Regular rhythm.  No murmurs. LUNGS:  Clear to P and A. ABDOMEN:  No palpable organs or masses. NEUROLOGIC:  The patient is oriented.  Cranial nerves intact.  She has no weakness in the upper or lower extremities.  Reflexes equal and normal.  ASSESSMENT:  It was felt that the patient may have had a transient ischemic attack.  MRI of the brain is pending as of carotid Doppler ultrasound.  The patient will be seen by neurologist.  She has diabetes. We will continue Lantus insulin and sliding scale.  She has chronic renal insufficiency, stage III, moderate and hypothyroidism.  We will continue her Synthroid.  She has acute on chronic renal failure, stage III, chronic kidney disease.     Sya Nestler G. Renard Matter, MD     AGM/MEDQ  D:  12/05/2012  T:  12/05/2012  Job:  [id number]

## 2012-12-06 ENCOUNTER — Observation Stay (HOSPITAL_COMMUNITY): Payer: Medicare Other

## 2012-12-06 ENCOUNTER — Observation Stay (HOSPITAL_COMMUNITY)
Admit: 2012-12-06 | Discharge: 2012-12-06 | Disposition: A | Discharge status: Home / Self Care | Payer: Medicare Other | Attending: Neurology | Admitting: Neurology

## 2012-12-06 LAB — HOMOCYSTEINE: Homocysteine: 13.9 umol/L (ref 4.0–15.4)

## 2012-12-06 LAB — GLUCOSE, CAPILLARY: Glucose-Capillary: 199 mg/dL — ABNORMAL HIGH (ref 70–99)

## 2012-12-06 MED ORDER — HYDRALAZINE HCL 25 MG PO TABS
25.0000 mg | ORAL_TABLET | Freq: Three times a day (TID) | ORAL | Status: DC
  Administered 2012-12-06 – 2012-12-07 (×2): 25 mg via ORAL
  Filled 2012-12-06 (×2): qty 1

## 2012-12-06 MED ORDER — LORAZEPAM 2 MG/ML IJ SOLN
1.0000 mg | Freq: Once | INTRAMUSCULAR | Status: AC
  Administered 2012-12-06: 1 mg via INTRAVENOUS
  Filled 2012-12-06: qty 1

## 2012-12-06 NOTE — Progress Notes (Signed)
EEG Completed; Results Pending  

## 2012-12-06 NOTE — Consult Note (Signed)
NAMEMEMORIE, YOKOYAMA NO.:  [id number]  MEDICAL RECORD NO.:  [id number]  LOCATION:  A324                          FACILITY:  [facility]  PHYSICIAN:  Gaynor Ferreras A. Gerilyn Pilgrim, M.D. DATE OF BIRTH:  05-14-1946  DATE OF CONSULTATION: DATE OF DISCHARGE:                                CONSULTATION   Acute mental status changes of unclear etiology.  Workup so far has been negative including imaging and metabolic workup.  Additional testing should include an EEG.  I also recommend dementia labs such as thyroid function tests and vitamin B12 level.  These will be written for.  It appears she has not had an acute infarct at least on imaging done so far.  She therefore can be maintained on the same antiplatelet agents as she previously was taking which is aspirin.  HISTORY:  This 67 year old white female, who is known to be quite confused, disoriented by her husband.  The patient admits to having very little information as to why she is in the hospital.  She cannot provide any adequate history.  She thinks she may have had a stroke.  The confusion and unresponsiveness improved significantly and she is apparently about at baseline.  She tells me she had a previous stroke with numbness and tingling along with pain on the right side.  She did have residual numbness on the right side and some impairment of hand dexterity.  She does not complain of any problems at this time other than the right-sided numbness.  She denies any chest pain, shortness of breath, headaches, focal weakness, dysarthria, GI/GU symptoms.  PHYSICAL EXAMINATION:  GENERAL:  A pleasant overweight lady, in no acute distress. HEENT:  Head is normocephalic, atraumatic. NECK:  Supple. ABDOMEN:  Soft. EXTREMITIES:  No significant edema. NEUROLOGIC:  Mentation:  She is awake.  She knows she is in the hospital.  Again she cannot provide a good history as to why she is in the hospital.  She speaks in full sentences and is  generally lucid.  No dysarthria is observed.  Cranial nerve evaluation, pupils are equal, round, reactive to light and accommodation.  Extraocular movements are full.  Facial and muscle strength is symmetric.  Tongue is midline. Uvula midline.  Motor examination shows no pronator drift.  She has good strength throughout.  Bulk and tone are also normal.  Reflexes are 2+. Plantars are equivocal on the right and down on the left.  Sensation is normal to light touch and temperature.     Bobbi Kozakiewicz A. Gerilyn Pilgrim, M.D.    KAD/MEDQ  D:  12/05/2012  T:  12/06/2012  Job:  [id number]

## 2012-12-07 LAB — GLUCOSE, CAPILLARY: Glucose-Capillary: 241 mg/dL — ABNORMAL HIGH (ref 70–99)

## 2012-12-07 LAB — LIPID PANEL
HDL: 36 mg/dL — ABNORMAL LOW (ref >39)
Total CHOL/HDL Ratio: 8.3 RATIO
VLDL: 77 mg/dL — ABNORMAL HIGH (ref 0–40)

## 2012-12-07 MED ORDER — CLONIDINE HCL 0.1 MG PO TABS
0.1000 mg | ORAL_TABLET | ORAL | Status: DC | PRN
Start: 2012-12-07 — End: 2012-12-12

## 2012-12-07 MED ORDER — HYDRALAZINE HCL 25 MG PO TABS: 25.0000 mg | ORAL_TABLET | Freq: Three times a day (TID) | ORAL | Status: DC

## 2012-12-07 MED ORDER — CLOPIDOGREL BISULFATE 75 MG PO TABS: 75.0000 mg | ORAL_TABLET | Freq: Every day | ORAL | Status: DC

## 2012-12-07 NOTE — Progress Notes (Signed)
NAMEROMY, IPOCK NO.:  [id number]  MEDICAL RECORD NO.:  [id number]  LOCATION:  A324                          FACILITY:  [facility]  PHYSICIAN:  Deloras Reichard G. Renard Matter, MD   DATE OF BIRTH:  [birth year]  DATE OF PROCEDURE: DATE OF DISCHARGE:                                PROGRESS NOTE   The patient appears alert as noted previously.  She was admitted with episodes of memory loss, which came out acutely.  She had previous CVA. CT done on admission did not show evidence of acute infarct.  She was felt to have had a transient ischemic attack.  Does have diabetes and is on Lantus insulin sliding scale, renal insufficiency stage III.  OBJECTIVE:  VITAL SIGNS:  Blood pressure 181/63, respirations 18, pulse 49, temp 97.7. HEENT:  Eyes, PERRLA.  TM negative.  Oropharynx benign. NECK:  Supple.  No JVD or thyroid abnormalities. HEART:  Regular rhythm.  No murmurs. LUNGS:  Clear to P and A. ABDOMEN:  No palpable organs or masses. NEUROLOGIC:  The patient was oriented.  She has slight weakness of right side.  ASSESSMENT:  The patient felt to have had transient ischemic attack. She does have diabetes and acute on chronic renal failure, stage III.  PLAN:  To proceed with MRI of brain today.  Also we will have EEG done by Neurology.  Continue antiplatelet regimen.     Britanie Harshman G. Renard Matter, MD     AGM/MEDQ  D:  12/06/2012  T:  12/07/2012  Job:  [id number]

## 2012-12-07 NOTE — Discharge Summary (Signed)
[name], [name] NO.:  [id number]  MEDICAL RECORD NO.:  [id number]  LOCATION:  A324                          FACILITY:  [facility]  PHYSICIAN:  Dunbar Buras G. Renard Matter, MD   DATE OF BIRTH:  04-16-1946  DATE OF ADMISSION:  12/04/2012 DATE OF DISCHARGE:  LH                              DISCHARGE SUMMARY   DISCHARGE MEDICATIONS:  The patient will be sent home on the following medications: 1. Plavix 75 mg daily. 2. Clonidine 0.1 mg b.i.d. 3. Hydralazine 25 mg b.i.d. 4. Isosorbide 60 mg daily. 5. Synthroid 112 mcg daily. 6. Potassium chloride 20 mEq daily. 7. Humalog insulin sliding scale. 8. Lexapro 20 mg daily. 9. Prilosec 20 mg daily. 10.Lantus insulin 62 units at bedtime. 11.Apresoline 50 mg b.i.d. 12.Biotin 5000 mcg capsules 1 by mouth daily. 13.Lasix 40 mg b.i.d. 14.Bystolic 5 mg daily. 15.Aspirin 325 mg daily. 16.Omega-3 fatty acids 3000 units b.i.d. 17.Vitamin D 2000 units daily. 18.Alprazolam 1 mg daily as needed for anxiety.  The patient will resume at discharge following medications: 1. Catapres 0.1 mg tablet b.i.d. 2. Plavix 75 mg daily. 3. Apresoline 25 mg b.i.d. 4. Xanax 1 mg as needed for anxiety. 5. Aspirin 325 mg daily. 6. Biotin 5000 mcg daily. 7. Vitamin D 2000 units daily. 8. Lexapro 20 mg daily. 9. Furosemide 40 mg daily. 10.Hydralazine 50 mg t.i.d. 11.Insulin Lantus 62 units at bedtime. 12.Humalog by sliding scale. 13.Imdur 60 mg daily. 14.Synthroid 112 mcg daily. 15.Bystolic 5 mg daily. 16.Omega-3 fatty acid daily. 17.Omeprazole 20 mg daily. 18.Potassium chloride 20 mEq daily.  DICTATION ENDS HERE     Ahava Kissoon G. Renard Matter, MD     AGM/MEDQ  D:  12/07/2012  T:  12/07/2012  Job:  [id number]

## 2012-12-07 NOTE — Progress Notes (Signed)
UR chart review completed.  

## 2012-12-07 NOTE — Discharge Summary (Signed)
NAMELUCEAL, HOLLIBAUGH NO.:  [id number]  MEDICAL RECORD NO.:  [id number]  LOCATION:  A324                          FACILITY:  [facility]  PHYSICIAN:  Kenyata Napier G. Renard Matter, MD   DATE OF BIRTH:  [birth year]  DATE OF ADMISSION:  12/04/2012 DATE OF DISCHARGE:  07/18/2014LH                              DISCHARGE SUMMARY   DIAGNOSES: 1. Transient ischemic attack. 2. Acute on chronic renal failure, stage III. 3. Congestive heart failure with diastolic dysfunction and pulmonary     hypertension. 4. Normocytic anemia. 5. Diabetes mellitus, type 2, insulin dependent. 6. Hypothyroidism. 7. Hyperlipidemia. 8. Vertigo. 9. Dyslipidemia. 10.Coronary artery disease. 11.Essential hypertension.  HISTORY OF PRESENT ILLNESS:  This patient, apparently, on day of admission  had episode of altered mental status.  The patient could not remember where she was when she awakened, this went on for several hours.  She was seen by primary physician and noted to have markedly elevated blood pressure.  There was some question whether or not she was having a CVA.  She had not had any change in functional status, however. In the emergency department, she had CT of the head, which showed no acute abnormalities.  The symptoms, after a few hours resolved; however, she was admitted to the hospital for further evaluation.  PHYSICAL EXAMINATION:  GENERAL:  Alert female. VITAL SIGNS:  Blood pressure 176/45, pulse 58, temp 98.4. HEENT:  Eyes,  PERRLA.  TM negative.  Oropharynx benign. NECK:  Supple.  No JVD or thyroid abnormalities. HEART:  Regular rhythm.  No murmurs. LUNGS:  Clear to P and A. ABDOMEN:  No palpable organs or masses.  No organomegaly. SKIN:  Warm and dry. NEUROLOGICAL:  Cranial nerves intact.  She had no sensory or motor abnormalities or abnormal reflexes.  LABORATORY DATA:  Admission CBC; WBC 7,300 with hemoglobin 13, hematocrit 38.1.  Chemistries; sodium 139, potassium 4.1, chloride  100, CO2 29, glucose 170, BUN 44, creatinine 1.52, calcium 9.8, AST 39, ALT 44, alkaline phosphatase 48, bilirubin 0.3, prothrombin time 7.6, albumin 3.6.  Cardiac markers; troponin less than 0.30 and proBNP 1,465.  CT of the head without contrast, no acute intracranial abnormality, stable atrophy and chronic white matter disease, stable lacunar infarction in right cerebellum and complete opacification of right mastoid air cells.  MRI of the brain, no acute infarct, remote bilateral cerebellar infarcts, larger on the right side.  HOSPITAL COURSE:  The patient was placed on a diet, carb modified.  He was continued on following medications; Xanax 1 mg b.i.d., aspirin 325 mg daily, vitamin D 2000 units daily, Plavix 75 mg daily, Lexapro tablets 20 mg daily, Apresoline 25 mg every 8 hours, isosorbide mononitrate 60 mg daily, levothyroxine 112 mcg daily, Protonix 40 mg daily.  The patient was given insulin Lantus 20 units daily at bedtime and NovoLog insulin according to sliding scale.  Her lipids showed a cholesterol 304, triglycerides 427, HDL 30, LDL unable to calculate. The patient will be placed on Crestor 20 mg daily as an outpatient.  She was seen in consultation by Neurology, Dr. Gerilyn Pilgrim who agreed that this was consistent with TIA.  She has had a stable lacunar infarct in  the right cerebellum.  He recommended she be maintained on antiplatelet agents, aspirin, and Plavix.     Talli Kimmer G. Renard Matter, MD     AGM/MEDQ  D:  12/07/2012  T:  12/07/2012  Job:  [id number]

## 2012-12-07 NOTE — Care Management Note (Signed)
    Page 1 of 1   12/07/2012     8:52:28 AM   CARE MANAGEMENT NOTE 12/07/2012  Patient:  [name], [name]   Account Number:  [id number]  Date Initiated:  12/07/2012  Documentation initiated by:  Sharrie Rothman  Subjective/Objective Assessment:   Pt admitted from home with TIA. Pt lives with her husband and will return home at discharge. Pt is fairly independent with ADL's.     Action/Plan:   No CM needs noted. Pt discharged home today.   Anticipated DC Date:  12/07/2012   Anticipated DC Plan:  HOME/SELF CARE      DC Planning Services  CM consult      Choice offered to / List presented to:             Status of service:  Completed, signed off Medicare Important Message given?   (If response is "NO", the following Medicare IM given date fields will be blank) Date Medicare IM given:   Date Additional Medicare IM given:    Discharge Disposition:  HOME/SELF CARE  Per UR Regulation:    If discussed at Long Length of Stay Meetings, dates discussed:    Comments:  12/07/12 0850 Arlyss Queen, RN BSN CM

## 2012-12-07 NOTE — Progress Notes (Signed)
Patient with orders to be discharge home. Discharge instructions given to patient and spouse, verbalized understanding. Prescriptions given. Patient in stable condition upon discharge. Patient left with spouse in private vehicle.

## 2012-12-09 ENCOUNTER — Emergency Department (HOSPITAL_COMMUNITY): Payer: Medicare Other

## 2012-12-09 ENCOUNTER — Inpatient Hospital Stay (HOSPITAL_COMMUNITY)
Admission: EM | Admit: 2012-12-09 | Discharge: 2012-12-12 | DRG: 292 | Disposition: A | Discharge status: Home / Self Care | Payer: Medicare Other | Attending: Family Medicine | Admitting: Family Medicine

## 2012-12-09 ENCOUNTER — Encounter (HOSPITAL_COMMUNITY): Payer: Self-pay | Admitting: Emergency Medicine

## 2012-12-09 DIAGNOSIS — I2789 Other specified pulmonary heart diseases: Secondary | ICD-10-CM | POA: Diagnosis present

## 2012-12-09 DIAGNOSIS — E119 Type 2 diabetes mellitus without complications: Secondary | ICD-10-CM

## 2012-12-09 DIAGNOSIS — I119 Hypertensive heart disease without heart failure: Secondary | ICD-10-CM | POA: Diagnosis present

## 2012-12-09 DIAGNOSIS — Z7902 Long term (current) use of antithrombotics/antiplatelets: Secondary | ICD-10-CM

## 2012-12-09 DIAGNOSIS — I10 Essential (primary) hypertension: Secondary | ICD-10-CM | POA: Diagnosis present

## 2012-12-09 DIAGNOSIS — I2510 Atherosclerotic heart disease of native coronary artery without angina pectoris: Secondary | ICD-10-CM | POA: Diagnosis present

## 2012-12-09 DIAGNOSIS — E039 Hypothyroidism, unspecified: Secondary | ICD-10-CM | POA: Diagnosis present

## 2012-12-09 DIAGNOSIS — E7800 Pure hypercholesterolemia, unspecified: Secondary | ICD-10-CM | POA: Diagnosis present

## 2012-12-09 DIAGNOSIS — I701 Atherosclerosis of renal artery: Secondary | ICD-10-CM | POA: Diagnosis present

## 2012-12-09 DIAGNOSIS — G4733 Obstructive sleep apnea (adult) (pediatric): Secondary | ICD-10-CM | POA: Diagnosis present

## 2012-12-09 DIAGNOSIS — M129 Arthropathy, unspecified: Secondary | ICD-10-CM | POA: Diagnosis present

## 2012-12-09 DIAGNOSIS — F329 Major depressive disorder, single episode, unspecified: Secondary | ICD-10-CM | POA: Diagnosis present

## 2012-12-09 DIAGNOSIS — F3289 Other specified depressive episodes: Secondary | ICD-10-CM | POA: Diagnosis present

## 2012-12-09 DIAGNOSIS — Z8249 Family history of ischemic heart disease and other diseases of the circulatory system: Secondary | ICD-10-CM

## 2012-12-09 DIAGNOSIS — I252 Old myocardial infarction: Secondary | ICD-10-CM

## 2012-12-09 DIAGNOSIS — I509 Heart failure, unspecified: Secondary | ICD-10-CM | POA: Diagnosis present

## 2012-12-09 DIAGNOSIS — E1129 Type 2 diabetes mellitus with other diabetic kidney complication: Secondary | ICD-10-CM | POA: Diagnosis present

## 2012-12-09 DIAGNOSIS — F411 Generalized anxiety disorder: Secondary | ICD-10-CM | POA: Diagnosis present

## 2012-12-09 DIAGNOSIS — Z794 Long term (current) use of insulin: Secondary | ICD-10-CM

## 2012-12-09 DIAGNOSIS — Z886 Allergy status to analgesic agent status: Secondary | ICD-10-CM

## 2012-12-09 DIAGNOSIS — G4730 Sleep apnea, unspecified: Secondary | ICD-10-CM

## 2012-12-09 DIAGNOSIS — R51 Headache: Secondary | ICD-10-CM | POA: Diagnosis present

## 2012-12-09 DIAGNOSIS — R441 Visual hallucinations: Secondary | ICD-10-CM | POA: Diagnosis present

## 2012-12-09 DIAGNOSIS — I110 Hypertensive heart disease with heart failure: Principal | ICD-10-CM | POA: Diagnosis present

## 2012-12-09 DIAGNOSIS — Z87891 Personal history of nicotine dependence: Secondary | ICD-10-CM

## 2012-12-09 DIAGNOSIS — Z885 Allergy status to narcotic agent status: Secondary | ICD-10-CM

## 2012-12-09 DIAGNOSIS — Z888 Allergy status to other drugs, medicaments and biological substances status: Secondary | ICD-10-CM

## 2012-12-09 DIAGNOSIS — R4182 Altered mental status, unspecified: Secondary | ICD-10-CM

## 2012-12-09 DIAGNOSIS — G609 Hereditary and idiopathic neuropathy, unspecified: Secondary | ICD-10-CM | POA: Diagnosis present

## 2012-12-09 DIAGNOSIS — Z880 Allergy status to penicillin: Secondary | ICD-10-CM

## 2012-12-09 DIAGNOSIS — Z7982 Long term (current) use of aspirin: Secondary | ICD-10-CM

## 2012-12-09 DIAGNOSIS — Z8673 Personal history of transient ischemic attack (TIA), and cerebral infarction without residual deficits: Secondary | ICD-10-CM

## 2012-12-09 DIAGNOSIS — Z881 Allergy status to other antibiotic agents status: Secondary | ICD-10-CM

## 2012-12-09 DIAGNOSIS — R443 Hallucinations, unspecified: Secondary | ICD-10-CM | POA: Diagnosis present

## 2012-12-09 DIAGNOSIS — K219 Gastro-esophageal reflux disease without esophagitis: Secondary | ICD-10-CM | POA: Diagnosis present

## 2012-12-09 DIAGNOSIS — E785 Hyperlipidemia, unspecified: Secondary | ICD-10-CM | POA: Diagnosis present

## 2012-12-09 HISTORY — DX: Dizziness and giddiness: R42

## 2012-12-09 LAB — CBC WITH DIFFERENTIAL/PLATELET
Basophils Relative: 1 % (ref 0–1)
Eosinophils Absolute: 0.3 10*3/uL (ref 0.0–0.7)
Hemoglobin: 13 g/dL (ref 12.0–15.0)
MCH: 30.7 pg (ref 26.0–34.0)
MCHC: 34.9 g/dL (ref 30.0–36.0)
Monocytes Relative: 8 % (ref 3–12)
Neutrophils Relative %: 55 % (ref 43–77)
Platelets: 232 10*3/uL (ref 150–400)
RDW: 13.4 % (ref 11.5–15.5)

## 2012-12-09 LAB — AMMONIA: Ammonia: 19 umol/L (ref 11–60)

## 2012-12-09 LAB — URINALYSIS, ROUTINE W REFLEX MICROSCOPIC
Leukocytes, UA: NEGATIVE
Nitrite: NEGATIVE
Protein, ur: 30 mg/dL — AB
Urobilinogen, UA: 0.2 mg/dL (ref 0.0–1.0)

## 2012-12-09 LAB — COMPREHENSIVE METABOLIC PANEL
ALT: 50 U/L — ABNORMAL HIGH (ref 0–35)
BUN: 31 mg/dL — ABNORMAL HIGH (ref 6–23)
Calcium: 9.8 mg/dL (ref 8.4–10.5)
GFR calc Af Amer: 43 mL/min — ABNORMAL LOW (ref >90)
Glucose, Bld: 88 mg/dL (ref 70–99)
Sodium: 134 mEq/L — ABNORMAL LOW (ref 135–145)
Total Protein: 7.5 g/dL (ref 6.0–8.3)

## 2012-12-09 LAB — URINE MICROSCOPIC-ADD ON

## 2012-12-09 MED ORDER — SODIUM CHLORIDE 0.9 % IJ SOLN
3.0000 mL | Freq: Two times a day (BID) | INTRAMUSCULAR | Status: DC
  Administered 2012-12-11: 3 mL via INTRAVENOUS

## 2012-12-09 MED ORDER — SODIUM CHLORIDE 0.9 % IJ SOLN
3.0000 mL | Freq: Two times a day (BID) | INTRAMUSCULAR | Status: DC
  Administered 2012-12-10 – 2012-12-11 (×3): 3 mL via INTRAVENOUS

## 2012-12-09 MED ORDER — POTASSIUM CHLORIDE CRYS ER 20 MEQ PO TBCR
20.0000 meq | EXTENDED_RELEASE_TABLET | Freq: Every day | ORAL | Status: DC
  Administered 2012-12-10 – 2012-12-12 (×3): 20 meq via ORAL
  Filled 2012-12-09 (×3): qty 1

## 2012-12-09 MED ORDER — HYDRALAZINE HCL 25 MG PO TABS
25.0000 mg | ORAL_TABLET | Freq: Once | ORAL | Status: AC
  Administered 2012-12-09: 25 mg via ORAL
  Filled 2012-12-09: qty 1

## 2012-12-09 MED ORDER — ACETAMINOPHEN 325 MG PO TABS: 650.0000 mg | ORAL_TABLET | Freq: Four times a day (QID) | ORAL | Status: DC | PRN

## 2012-12-09 MED ORDER — LEVOTHYROXINE SODIUM 112 MCG PO TABS
112.0000 ug | ORAL_TABLET | Freq: Every day | ORAL | Status: DC
  Administered 2012-12-10 – 2012-12-12 (×3): 112 ug via ORAL
  Filled 2012-12-09 (×3): qty 1

## 2012-12-09 MED ORDER — CLOPIDOGREL BISULFATE 75 MG PO TABS
75.0000 mg | ORAL_TABLET | Freq: Every day | ORAL | Status: DC
  Administered 2012-12-10 – 2012-12-12 (×3): 75 mg via ORAL
  Filled 2012-12-09 (×3): qty 1

## 2012-12-09 MED ORDER — INSULIN GLARGINE 100 UNIT/ML ~~LOC~~ SOLN
30.0000 [IU] | Freq: Every day | SUBCUTANEOUS | Status: DC
  Administered 2012-12-09 – 2012-12-10 (×2): 30 [IU] via SUBCUTANEOUS
  Filled 2012-12-09 (×2): qty 0.3

## 2012-12-09 MED ORDER — HYDRALAZINE HCL 25 MG PO TABS
ORAL_TABLET | ORAL | Status: AC
  Filled 2012-12-09: qty 1

## 2012-12-09 MED ORDER — ASPIRIN EC 325 MG PO TBEC
325.0000 mg | DELAYED_RELEASE_TABLET | Freq: Every day | ORAL | Status: DC
  Administered 2012-12-10 – 2012-12-12 (×3): 325 mg via ORAL
  Filled 2012-12-09 (×3): qty 1

## 2012-12-09 MED ORDER — INSULIN ASPART 100 UNIT/ML ~~LOC~~ SOLN
0.0000 [IU] | Freq: Three times a day (TID) | SUBCUTANEOUS | Status: DC
  Administered 2012-12-10: 5 [IU] via SUBCUTANEOUS
  Administered 2012-12-10: 3 [IU] via SUBCUTANEOUS
  Administered 2012-12-10: 8 [IU] via SUBCUTANEOUS
  Administered 2012-12-11: 3 [IU] via SUBCUTANEOUS
  Administered 2012-12-11 (×2): 5 [IU] via SUBCUTANEOUS
  Administered 2012-12-12: 3 [IU] via SUBCUTANEOUS

## 2012-12-09 MED ORDER — FUROSEMIDE 40 MG PO TABS
40.0000 mg | ORAL_TABLET | Freq: Two times a day (BID) | ORAL | Status: DC
  Administered 2012-12-10 – 2012-12-12 (×5): 40 mg via ORAL
  Filled 2012-12-09 (×5): qty 1

## 2012-12-09 MED ORDER — ENOXAPARIN SODIUM 40 MG/0.4ML ~~LOC~~ SOLN
40.0000 mg | SUBCUTANEOUS | Status: DC
  Administered 2012-12-09 – 2012-12-11 (×3): 40 mg via SUBCUTANEOUS
  Filled 2012-12-09 (×3): qty 0.4

## 2012-12-09 MED ORDER — ISOSORBIDE MONONITRATE ER 60 MG PO TB24
60.0000 mg | ORAL_TABLET | Freq: Every day | ORAL | Status: DC
  Administered 2012-12-10 – 2012-12-11 (×2): 60 mg via ORAL
  Filled 2012-12-09 (×3): qty 1

## 2012-12-09 MED ORDER — ONDANSETRON HCL 4 MG PO TABS
4.0000 mg | ORAL_TABLET | Freq: Four times a day (QID) | ORAL | Status: DC | PRN
  Administered 2012-12-12: 4 mg via ORAL
  Filled 2012-12-09: qty 1

## 2012-12-09 MED ORDER — SODIUM CHLORIDE 0.9 % IV SOLN: 250.0000 mL | INTRAVENOUS | Status: DC | PRN

## 2012-12-09 MED ORDER — INSULIN GLARGINE 100 UNIT/ML ~~LOC~~ SOLN
SUBCUTANEOUS | Status: AC
  Filled 2012-12-09: qty 10

## 2012-12-09 MED ORDER — PANTOPRAZOLE SODIUM 40 MG PO TBEC
80.0000 mg | DELAYED_RELEASE_TABLET | Freq: Every day | ORAL | Status: DC
  Administered 2012-12-10 – 2012-12-12 (×3): 80 mg via ORAL
  Filled 2012-12-09 (×3): qty 2

## 2012-12-09 MED ORDER — ESCITALOPRAM OXALATE 10 MG PO TABS
20.0000 mg | ORAL_TABLET | Freq: Every day | ORAL | Status: DC
Start: 2012-12-10 — End: 2012-12-12
  Administered 2012-12-10 – 2012-12-12 (×3): 20 mg via ORAL
  Filled 2012-12-09 (×3): qty 2

## 2012-12-09 MED ORDER — ONDANSETRON HCL 4 MG/2ML IJ SOLN: 4.0000 mg | Freq: Four times a day (QID) | INTRAMUSCULAR | Status: DC | PRN

## 2012-12-09 MED ORDER — ACETAMINOPHEN 650 MG RE SUPP: 650.0000 mg | Freq: Four times a day (QID) | RECTAL | Status: DC | PRN

## 2012-12-09 MED ORDER — SODIUM CHLORIDE 0.9 % IJ SOLN: 3.0000 mL | INTRAMUSCULAR | Status: DC | PRN

## 2012-12-09 MED ORDER — SODIUM CHLORIDE 0.9 % IV SOLN
INTRAVENOUS | Status: DC
  Administered 2012-12-09: 15:00:00 via INTRAVENOUS

## 2012-12-09 MED ORDER — SODIUM CHLORIDE 0.9 % IV SOLN: INTRAVENOUS | Status: DC

## 2012-12-09 MED ORDER — HYDRALAZINE HCL 20 MG/ML IJ SOLN
10.0000 mg | Freq: Four times a day (QID) | INTRAMUSCULAR | Status: DC | PRN
  Administered 2012-12-09: 10 mg via INTRAVENOUS
  Filled 2012-12-09: qty 1

## 2012-12-09 MED ORDER — ALPRAZOLAM ER 1 MG PO TB24: 1.0000 mg | ORAL_TABLET | Freq: Every evening | ORAL | Status: DC | PRN

## 2012-12-09 MED ORDER — ALPRAZOLAM 1 MG PO TABS
1.0000 mg | ORAL_TABLET | Freq: Every evening | ORAL | Status: DC | PRN
  Administered 2012-12-09 – 2012-12-11 (×3): 1 mg via ORAL
  Filled 2012-12-09 (×3): qty 1

## 2012-12-09 NOTE — ED Notes (Signed)
Family out to desk concerned about pt's BP. Family reassured and EDP notified.

## 2012-12-09 NOTE — ED Notes (Signed)
Pt refused in and out cath, pt states they have gave her infections before and she did not want one. RN is aware.

## 2012-12-09 NOTE — ED Notes (Signed)
Pt presents with c/o hypertension, via EMS. Pt reports was admitted 5 days ago for " stroke like symptoms".  Pt states her blood pressure would not read this morning on her machine. Pt denies pain, dizziness, headache or other c/o at this time.  Neuro screen negative at this time. Family at bedside instructing staff how to and what to do for pt.

## 2012-12-09 NOTE — ED Notes (Signed)
Attempted to call report to RN on 300 unit. Unable to do so secondary to shift change. Will try again in 15 min.

## 2012-12-09 NOTE — ED Notes (Addendum)
Pt c/o elevated bp this am. Pt husband states her bp would not read on her home bp machine. Pt c/o slight ha. Denies cp/sob. Pt also confused. Pt husband reports confusion since Tuesday. Pt states she took 0.1mg  clonidine po prior to ems arrival at home.

## 2012-12-09 NOTE — ED Provider Notes (Signed)
History    CSN: [id number] Arrival date & time 12/09/12  1237  First MD Initiated Contact with Patient 12/09/12 1428     Chief Complaint  Patient presents with  . Hypertension  . Altered Mental Status    Patient is a 67 y.o. female presenting with hypertension and altered mental status. The history is provided by a caregiver, a relative and the patient. The history is limited by the condition of the patient (AMS, confusion).  Hypertension  Altered Mental Status Pt was seen at 1445. Per pt's family, c/o gradual onset and worsening of persistent confusion and AMS since being discharged from the hospital 2 days ago for same. Pt's family states she woke up this morning "seeing things that weren't there." Pt states now that her grandson rode in the ambulance with her, but family states he is home in bed. Family took pt's BP this morning before giving pt her morning BP meds and "it was high." Denies syncope, no falls, no facial droop, no slurred speech, no focal motor weakness, no tingling/numbness in extremities, no fevers, no SOB/cough.     Past Medical History  Diagnosis Date  . HTN (hypertension)   . Elevated troponin level 09/28/11    Type II MI - not ACS related  . Diastolic dysfunction, left ventricle     with previous Diastolic HF  . Diabetes mellitus   . High cholesterol   . Kidney disease   . Cataracts, bilateral   . 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 Pulmonary HTN-severe- due to diastolic dysfunction 09/29/2011    Moderate to Severe Pulm HTN.  Marland Kitchen Acute renal failure 09/29/2011  . 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 HYPOTHYROIDISM 06/19/2008    Qualifier: Diagnosis of  By: Excell Seltzer CMA, Lawson Fiscal    . Renal artery stenosis     s/p stents (restenosis)  . Subclavian arterial stenosis   . Peripheral neuropathy   . Coronary artery disease   . Myocardial infarction   . Pericarditis   . Anxiety   .  Depression   . Shortness of breath   . CHF (congestive heart failure)   . GERD (gastroesophageal reflux disease)   . Obstructive sleep apnea on nocturnal CPAP 06/20/2008    uses CPAP @ night  . Unstable angina   . Stroke   . Arthritis   . Vertigo    Past Surgical History  Procedure Laterality Date  . Cardiac catheterization  01/2011    Minimal luminal irregularties  . Laparoscopic tubal ligation    . Renal artery stent      x 2 (restenosis)  . Colonoscopy  10/12/2011    Procedure: COLONOSCOPY;  Surgeon: Louis Meckel, MD;  Location: [facility] ENDOSCOPY;  Service: Endoscopy;  Laterality: N/A;  . Esophagogastroduodenoscopy  10/12/2011    Procedure: ESOPHAGOGASTRODUODENOSCOPY (EGD);  Surgeon: Louis Meckel, MD;  Location: [facility] ENDOSCOPY;  Service: Endoscopy;  Laterality: N/A;  . Tubal ligation    . Colonoscopy  10/21/2011    Procedure: COLONOSCOPY;  Surgeon: Hilarie Fredrickson, MD;  Location: [facility] ENDOSCOPY;  Service: Endoscopy;  Laterality: N/A;  . Colonoscopy  10/25/2011    Procedure: COLONOSCOPY;  Surgeon: Beverley Fiedler, MD;  Location: [facility] ENDOSCOPY;  Service: Gastroenterology;  Laterality: N/A;  . Colonoscopy  10/23/2011    Procedure: COLONOSCOPY;  Surgeon: Hilarie Fredrickson, MD;  Location: [facility] ENDOSCOPY;  Service: Endoscopy;  Laterality: N/A;  . Colon surgery    . Back  surgery       x 2  . Cataract extraction w/phaco  03/29/2012    Procedure: CATARACT EXTRACTION PHACO AND INTRAOCULAR LENS PLACEMENT (IOC);  Surgeon: Gemma Payor, MD;  Location: [facility] ORS;  Service: Ophthalmology;  Laterality: Right;  CDE: 16.87  . Cataract extraction w/phaco  04/16/2012    Procedure: CATARACT EXTRACTION PHACO AND INTRAOCULAR LENS PLACEMENT (IOC);  Surgeon: Gemma Payor, MD;  Location: [facility] ORS;  Service: Ophthalmology;  Laterality: Left;  CDE:  14.98   Family History  Problem Relation Age of Onset  . Heart disease Mother   . Heart attack Mother   . Heart disease Father   . Heart attack Father   . Heart disease Sister   . Heart  disease Daughter    History  Substance Use Topics  . Smoking status: Former Smoker -- 1.00 packs/day for 25 years    Types: Cigarettes    Quit date: 05/23/2004  . Smokeless tobacco: Not on file  . Alcohol Use: No    Review of Systems  Unable to perform ROS: Mental status change  Psychiatric/Behavioral: Positive for altered mental status.        Allergies  Codeine; Lidocaine; Atenolol; Bystolic; Ciprofloxacin; Metformin; Metoprolol; Morphine and related; and Penicillins  Home Medications   Current Outpatient Rx  Name  Route  Sig  Dispense  Refill  . ALPRAZolam (XANAX XR) 1 MG 24 hr tablet   Oral   Take 1 mg by mouth daily as needed. Anxiety         . aspirin 325 MG EC tablet   Oral   Take 325 mg by mouth daily.         . Biotin 5000 MCG CAPS   Oral   Take 1 capsule by mouth daily.         . Cholecalciferol (VITAMIN D) 2000 UNITS tablet   Oral   Take 2,000 Units by mouth daily.         . cloNIDine (CATAPRES) 0.1 MG tablet   Oral   Take 1 tablet (0.1 mg total) by mouth every 4 (four) hours as needed (SBP over 150).   60 tablet   11   . clopidogrel (PLAVIX) 75 MG tablet   Oral   Take 1 tablet (75 mg total) by mouth daily with breakfast.   30 tablet   5   . escitalopram (LEXAPRO) 20 MG tablet   Oral   Take 20 mg by mouth daily.         . furosemide (LASIX) 40 MG tablet   Oral   Take 40 mg by mouth 2 (two) times daily.         . insulin glargine (LANTUS) 100 UNIT/ML injection   Subcutaneous   Inject 62 Units into the skin at bedtime.          . insulin lispro (HUMALOG) 100 UNIT/ML injection   Subcutaneous   Inject 10-30 Units into the skin 3 (three) times daily before meals. Uses according to a sliding scale at home. 10 units bid and 20 at supper         . isosorbide mononitrate (IMDUR) 60 MG 24 hr tablet   Oral   Take 60 mg by mouth daily.           Marland Kitchen levothyroxine (SYNTHROID, LEVOTHROID) 112 MCG tablet   Oral   Take 112 mcg by  mouth daily.           . nebivolol (BYSTOLIC) 5  MG tablet   Oral   Take 5 mg by mouth daily.         . Omega-3 Fatty Acids (FISH OIL TRIPLE STRENGTH PO)   Oral   Take 3,000 mg by mouth 2 (two) times daily.         Marland Kitchen omeprazole (PRILOSEC) 40 MG capsule   Oral   Take 40 mg by mouth daily.         . potassium chloride SA (K-DUR,KLOR-CON) 20 MEQ tablet   Oral   Take 1 tablet (20 mEq total) by mouth daily.   30 tablet   0    BP 159/44  Pulse 51  Temp(Src) 98.5 F (36.9 C)  Resp 15  Ht 5\' 6"  (1.676 m)  Wt 185 lb (83.915 kg)  BMI 29.87 kg/m2  SpO2 97% Physical Exam 1450: Physical examination:  Nursing notes reviewed; Vital signs and O2 SAT reviewed;  Constitutional: Well developed, Well nourished, Well hydrated, In no acute distress; Head:  Normocephalic, atraumatic; Eyes: EOMI, PERRL, No scleral icterus; ENMT: Mouth and pharynx normal, Mucous membranes moist; Neck: Supple, Full range of motion, No lymphadenopathy; Cardiovascular: Regular rate and rhythm, No gallop; Respiratory: Breath sounds clear & equal bilaterally, No wheezes.  Speaking full sentences with ease, Normal respiratory effort/excursion; Chest: Nontender, Movement normal; Abdomen: Soft, Nontender, Nondistended, Normal bowel sounds; Genitourinary: No CVA tenderness; Extremities: Pulses normal, No tenderness, No edema, No calf edema or asymmetry.; Neuro: Awake, alert, confused re: time, place, events. Major CN grossly intact. No facial droop. Speech clear. Grips equal. Strength 4/5 equal bilat UE's and LE's. Moves all ext on stretcher without apparent gross focal motor deficits.; Skin: Color normal, Warm, Dry.   ED Course  Procedures    MDM  MDM Reviewed: previous chart, nursing note and vitals Reviewed previous: labs and ECG Interpretation: labs, ECG, x-ray and CT scan    Date: 12/09/2012  Rate: 49  Rhythm: sinus bradycardia  QRS Axis: left  Intervals: normal  ST/T Wave abnormalities: nonspecific ST/T  changes  Conduction Disutrbances:nonspecific intraventricular conduction delay  Narrative Interpretation: LVH with RA  Old EKG Reviewed: unchanged; no significant changes from previous EKG dated 10/13/2012.  Results for orders placed during the hospital encounter of 12/09/12  CBC WITH DIFFERENTIAL      Result Value Range   WBC 7.6  4.0 - 10.5 K/uL   RBC 4.23  3.87 - 5.11 MIL/uL   Hemoglobin 13.0  12.0 - 15.0 g/dL   HCT 16.1  09.6 - 04.5 %   MCV 87.9  78.0 - 100.0 fL   MCH 30.7  26.0 - 34.0 pg   MCHC 34.9  30.0 - 36.0 g/dL   RDW 40.9  81.1 - 91.4 %   Platelets 232  150 - 400 K/uL   Neutrophils Relative % 55  43 - 77 %   Neutro Abs 4.1  1.7 - 7.7 K/uL   Lymphocytes Relative 32  12 - 46 %   Lymphs Abs 2.4  0.7 - 4.0 K/uL   Monocytes Relative 8  3 - 12 %   Monocytes Absolute 0.6  0.1 - 1.0 K/uL   Eosinophils Relative 4  0 - 5 %   Eosinophils Absolute 0.3  0.0 - 0.7 K/uL   Basophils Relative 1  0 - 1 %   Basophils Absolute 0.1  0.0 - 0.1 K/uL  LACTIC ACID, PLASMA      Result Value Range   Lactic Acid, Venous 1.7  0.5 - 2.2 mmol/L  COMPREHENSIVE METABOLIC PANEL      Result Value Range   Sodium 134 (*) 135 - 145 mEq/L   Potassium 4.3  3.5 - 5.1 mEq/L   Chloride 98  96 - 112 mEq/L   CO2 27  19 - 32 mEq/L   Glucose, Bld 88  70 - 99 mg/dL   BUN 31 (*) 6 - 23 mg/dL   Creatinine, Ser 0.86 (*) 0.50 - 1.10 mg/dL   Calcium 9.8  8.4 - 57.8 mg/dL   Total Protein 7.5  6.0 - 8.3 g/dL   Albumin 3.4 (*) 3.5 - 5.2 g/dL   AST 36  0 - 37 U/L   ALT 50 (*) 0 - 35 U/L   Alkaline Phosphatase 50  39 - 117 U/L   Total Bilirubin 0.4  0.3 - 1.2 mg/dL   GFR calc non Af Amer 38 (*) >90 mL/min   GFR calc Af Amer 43 (*) >90 mL/min  AMMONIA      Result Value Range   Ammonia 19  11 - 60 umol/L  TROPONIN I      Result Value Range   Troponin I <0.30  <0.30 ng/mL  URINALYSIS, ROUTINE W REFLEX MICROSCOPIC      Result Value Range   Color, Urine YELLOW  YELLOW   APPearance CLEAR  CLEAR   Specific  Gravity, Urine 1.010  1.005 - 1.030   pH 7.0  5.0 - 8.0   Glucose, UA NEGATIVE  NEGATIVE mg/dL   Hgb urine dipstick NEGATIVE  NEGATIVE   Bilirubin Urine NEGATIVE  NEGATIVE   Ketones, ur NEGATIVE  NEGATIVE mg/dL   Protein, ur 30 (*) NEGATIVE mg/dL   Urobilinogen, UA 0.2  0.0 - 1.0 mg/dL   Nitrite NEGATIVE  NEGATIVE   Leukocytes, UA NEGATIVE  NEGATIVE  URINE MICROSCOPIC-ADD ON      Result Value Range   WBC, UA 0-2  <3 WBC/hpf  GLUCOSE, CAPILLARY      Result Value Range   Glucose-Capillary 103 (*) 70 - 99 mg/dL   Dg Chest 2 View 4/69/6295   *RADIOLOGY REPORT*  Clinical Data: Altered mental status.  CHEST - 2 VIEW  Comparison: PA and lateral chest 10/13/2012.  Findings: The lungs are clear.  Heart size is normal.  No pneumothorax or pleural effusion.  No focal bony abnormality.  IMPRESSION: No acute disease.   Original Report Authenticated By: Holley Dexter, M.D.   Ct Head Wo Contrast 12/09/2012   *RADIOLOGY REPORT*  Clinical Data: Altered mental status  CT HEAD WITHOUT CONTRAST  Technique:  Contiguous axial images were obtained from the base of the skull through the vertex without contrast.  Comparison: MRI brain and 12/06/2012, CT 12/16/2011  Findings: Again demonstrate remote infarction in the right cerebellum.  Extensive atrophy and microvascular disease not changed from MRI 3 days prior.  No acute intracranial hemorrhage. No CT evidence of acute cortical infarction.  No midline shift or mass effect.  No hydrocephalus.  There is again demonstrated opacification of the right mastoid air cells.  IMPRESSION:  1.  No change in short interval follow-up MRI of 12/06/2012. 2.  No acute intracranial findings. 3.  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 4.  Extensive microvascular disease and atrophy.   Original Report Authenticated By: Genevive Bi, M.D.     [phone]:  No clear cause for AMS today. Pt woke up with "worse" symptoms after an already 2 day hx of same. Pt has no focal neuro  deficits; not code stroke  nor TPA candidate.  Will tx pt's BP with her usual rx hydralazine, as she is due for her usual dose now, and continue to monitor. Dx and testing d/w pt and family.  Questions answered.  Verb understanding, agreeable to admit. T/C to [facility] Dr. Kerry Hough, case discussed, including:  HPI, pertinent PM/SHx, VS/PE, dx testing, ED course and treatment:  Agreeable to observation admit, requests to write temporary orders, obtain tele bed to Dr. Renard Matter' service.   Laray Anger, DO 12/10/12 1719

## 2012-12-09 NOTE — ED Notes (Signed)
Pt slightly confused, short term memory issues.  Reporting slight headache, but "nothing too bad".  Pt denies any additional pain.  Slightly bradycardic and hypertensive, EDP aware.  Pt denies any additional complaints.  No acute distress noted.

## 2012-12-09 NOTE — H&P (Signed)
[facility] History and Physical  [name] AVW:[id number] DOB: [birth year] DOA: 12/09/2012   PCP: Alice Reichert, MD  Specialists: She is followed by Dr. Allyson Sabal, with cardiology. She's also followed by Dr. Arlyce Dice with gastroenterology.  Chief Complaint: Hallucinations, and high blood pressure  HPI: [name] is a 67 y.o. female with a past medical history of stroke, hypertension, diabetes, coronary artery disease, who was recently hospitalized for TIA. She was having hallucinations at that time. According to the patient's husband she woke up this morning and when he attempted to check her blood pressure he did not get a good reading. He called EMS and when they checked the blood pressure, it was 220 systolic. And, subsequently, she was brought into the emergency department. Patient doesn't remember the events of this morning. She has been confused. She has been seeing people who are not there around her. This has been ongoing since last Tuesday. She underwent a TIA workup. She was also seen by neurology during her previous hospitalization. MRI done just a few days ago, did not reveal any acute infarct. Old remote infarcts were noted. She denies any chest pain, shortness of breath. She was short of breath earlier today. No cough. No fever. No chills. No nausea, vomiting. She did have a mild headache. Denies any dysuria, but has had some difficulty with the initiating urine. Denies any leg swelling. She is supposed to see Dr. Arlyce Dice this Wednesday for followup. She denies any new medications, apart from the Plavix and some blood pressure medicine. Denies any new psychotropic agents.  Home Medications: Prior to Admission medications   Medication Sig Start Date End Date Taking? Authorizing Provider  ALPRAZolam (XANAX XR) 1 MG 24 hr tablet Take 1 mg by mouth daily as needed. Anxiety   Yes Historical Provider, MD  aspirin 325 MG EC tablet Take 325 mg by mouth daily.   Yes Historical  Provider, MD  Biotin 5000 MCG CAPS Take 1 capsule by mouth daily.   Yes Historical Provider, MD  Cholecalciferol (VITAMIN D) 2000 UNITS tablet Take 2,000 Units by mouth daily.   Yes Historical Provider, MD  cloNIDine (CATAPRES) 0.1 MG tablet Take 1 tablet (0.1 mg total) by mouth every 4 (four) hours as needed (SBP over 150). 12/07/12  Yes Angus Edilia Bo, MD  clopidogrel (PLAVIX) 75 MG tablet Take 1 tablet (75 mg total) by mouth daily with breakfast. 12/07/12  Yes Angus Edilia Bo, MD  escitalopram (LEXAPRO) 20 MG tablet Take 20 mg by mouth daily.   Yes Historical Provider, MD  furosemide (LASIX) 40 MG tablet Take 40 mg by mouth 2 (two) times daily.   Yes Historical Provider, MD  insulin glargine (LANTUS) 100 UNIT/ML injection Inject 62 Units into the skin at bedtime.  10/30/11  Yes Calvert Cantor, MD  insulin lispro (HUMALOG) 100 UNIT/ML injection Inject 10-30 Units into the skin 3 (three) times daily before meals. Uses according to a sliding scale at home. 10 units bid and 20 at supper   Yes Historical Provider, MD  isosorbide mononitrate (IMDUR) 60 MG 24 hr tablet Take 60 mg by mouth daily.     Yes Historical Provider, MD  levothyroxine (SYNTHROID, LEVOTHROID) 112 MCG tablet Take 112 mcg by mouth daily.     Yes Historical Provider, MD  nebivolol (BYSTOLIC) 5 MG tablet Take 5 mg by mouth daily.   Yes Historical Provider, MD  Omega-3 Fatty Acids (FISH OIL TRIPLE STRENGTH PO) Take 3,000 mg by mouth 2 (two) times  daily.   Yes Historical Provider, MD  omeprazole (PRILOSEC) 40 MG capsule Take 40 mg by mouth daily.   Yes Historical Provider, MD  potassium chloride SA (K-DUR,KLOR-CON) 20 MEQ tablet Take 1 tablet (20 mEq total) by mouth daily. 10/13/11  Yes Laveda Norman, MD    Allergies:  Allergies  Allergen Reactions  . Codeine Anaphylaxis    swelling  . Lidocaine Anaphylaxis  . Atenolol Other (See Comments)    Unknown  . Bystolic (Nebivolol Hcl)     Cannot tolerate greater then 5 mg, causes chest pain   . Ciprofloxacin Itching  . Metformin Nausea And Vomiting  . Metoprolol Other (See Comments)    Burns ears  . Morphine And Related Other (See Comments)    Makes patient feel like she is floating  . Penicillins Rash    Past Medical History: Past Medical History  Diagnosis Date  . HTN (hypertension)   . Elevated troponin level 09/28/11    Type II MI - not ACS related  . Diastolic dysfunction, left ventricle     with previous Diastolic HF  . Diabetes mellitus   . High cholesterol   . Kidney disease   . Cataracts, bilateral   . 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 Pulmonary HTN-severe- due to diastolic dysfunction 09/29/2011    Moderate to Severe Pulm HTN.  Marland Kitchen Acute renal failure 09/29/2011  . 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 HYPOTHYROIDISM 06/19/2008    Qualifier: Diagnosis of  By: Excell Seltzer CMA, Lawson Fiscal    . Renal artery stenosis     s/p stents (restenosis)  . Subclavian arterial stenosis   . Peripheral neuropathy   . Coronary artery disease   . Myocardial infarction   . Pericarditis   . Anxiety   . Depression   . Shortness of breath   . CHF (congestive heart failure)   . GERD (gastroesophageal reflux disease)   . Obstructive sleep apnea on nocturnal CPAP 06/20/2008    uses CPAP @ night  . Unstable angina   . Stroke   . Arthritis   . Vertigo     Past Surgical History  Procedure Laterality Date  . Cardiac catheterization  01/2011    Minimal luminal irregularties  . Laparoscopic tubal ligation    . Renal artery stent      x 2 (restenosis)  . Colonoscopy  10/12/2011    Procedure: COLONOSCOPY;  Surgeon: Louis Meckel, MD;  Location: [facility] ENDOSCOPY;  Service: Endoscopy;  Laterality: N/A;  . Esophagogastroduodenoscopy  10/12/2011    Procedure: ESOPHAGOGASTRODUODENOSCOPY (EGD);  Surgeon: Louis Meckel, MD;  Location: [facility] ENDOSCOPY;  Service: Endoscopy;  Laterality: N/A;  . Tubal ligation    .  Colonoscopy  10/21/2011    Procedure: COLONOSCOPY;  Surgeon: Hilarie Fredrickson, MD;  Location: [facility] ENDOSCOPY;  Service: Endoscopy;  Laterality: N/A;  . Colonoscopy  10/25/2011    Procedure: COLONOSCOPY;  Surgeon: Beverley Fiedler, MD;  Location: [facility] ENDOSCOPY;  Service: Gastroenterology;  Laterality: N/A;  . Colonoscopy  10/23/2011    Procedure: COLONOSCOPY;  Surgeon: Hilarie Fredrickson, MD;  Location: [facility] ENDOSCOPY;  Service: Endoscopy;  Laterality: N/A;  . Colon surgery    . Back surgery       x 2  . Cataract extraction w/phaco  03/29/2012    Procedure: CATARACT EXTRACTION PHACO AND INTRAOCULAR LENS PLACEMENT (IOC);  Surgeon: Gemma Payor, MD;  Location: [facility] ORS;  Service: Ophthalmology;  Laterality: Right;  CDE: 16.87  . Cataract extraction w/phaco  04/16/2012    Procedure: CATARACT EXTRACTION PHACO AND INTRAOCULAR LENS PLACEMENT (IOC);  Surgeon: Gemma Payor, MD;  Location: [facility] ORS;  Service: Ophthalmology;  Laterality: Left;  CDE:  14.98    Social History:  reports that she quit smoking about 8 years ago. Her smoking use included Cigarettes. She has a 25 pack-year smoking history. She does not have any smokeless tobacco history on file. She reports that she does not drink alcohol or use illicit drugs.  Living Situation: Lives with her husband Activity Level: Usually independent with daily activities, but the daughter mentioned that she has been unsteady on her gait   Family History:  Family History  Problem Relation Age of Onset  . Heart disease Mother   . Heart attack Mother   . Heart disease Father   . Heart attack Father   . Heart disease Sister   . Heart disease Daughter      Review of Systems - Unable to do due to confusion  Physical Examination  Filed Vitals:   12/09/12 1800 12/09/12 1815 12/09/12 1911 12/09/12 2008  BP: 221/79 189/51 194/47 171/67  Pulse: 51 52 52 88  Temp:    98.3 F (36.8 C)  Resp: 13 18  20   Height:    5\' 5"  (1.651 m)  Weight:    82.056 kg (180 lb 14.4 oz)  SpO2: 97% 97%  97% 96%    General appearance: alert, cooperative, appears stated age and no distress Head: Normocephalic, without obvious abnormality, atraumatic Eyes: conjunctivae/corneas clear. PERRL, EOM's intact. Fundi benign. Throat: lips, mucosa, and tongue normal; teeth and gums normal Resp: clear to auscultation bilaterally Cardio: regular rate and rhythm, S1, S2 normal, no murmur, click, rub or gallop GI: soft, non-tender; bowel sounds normal; no masses,  no organomegaly Extremities: extremities normal, atraumatic, no cyanosis or edema Pulses: 2+ and symmetric Skin: Skin color, texture, turgor normal. No rashes or lesions Lymph nodes: Cervical, supraclavicular, and axillary nodes normal. Neurologic: She is alert. She was oriented to person, place, year, month. She did not know the date. She knew the day. No cranial nerve deficits. She is subtle weakness of the right extremity, which is old per the patient. Gait was not assessed.  Laboratory Data: Results for orders placed during the hospital encounter of 12/09/12 (from the past 48 hour(s))  CBC WITH DIFFERENTIAL     Status: None   Collection Time    12/09/12  3:01 PM      Result Value Range   WBC 7.6  4.0 - 10.5 K/uL   RBC 4.23  3.87 - 5.11 MIL/uL   Hemoglobin 13.0  12.0 - 15.0 g/dL   HCT 16.1  09.6 - 04.5 %   MCV 87.9  78.0 - 100.0 fL   MCH 30.7  26.0 - 34.0 pg   MCHC 34.9  30.0 - 36.0 g/dL   RDW 40.9  81.1 - 91.4 %   Platelets 232  150 - 400 K/uL   Neutrophils Relative % 55  43 - 77 %   Neutro Abs 4.1  1.7 - 7.7 K/uL   Lymphocytes Relative 32  12 - 46 %   Lymphs Abs 2.4  0.7 - 4.0 K/uL   Monocytes Relative 8  3 - 12 %   Monocytes Absolute 0.6  0.1 - 1.0 K/uL   Eosinophils Relative 4  0 - 5 %   Eosinophils Absolute 0.3  0.0 - 0.7 K/uL  Basophils Relative 1  0 - 1 %   Basophils Absolute 0.1  0.0 - 0.1 K/uL  LACTIC ACID, PLASMA     Status: None   Collection Time    12/09/12  3:01 PM      Result Value Range   Lactic Acid, Venous  1.7  0.5 - 2.2 mmol/L  COMPREHENSIVE METABOLIC PANEL     Status: Abnormal   Collection Time    12/09/12  3:02 PM      Result Value Range   Sodium 134 (*) 135 - 145 mEq/L   Potassium 4.3  3.5 - 5.1 mEq/L   Chloride 98  96 - 112 mEq/L   CO2 27  19 - 32 mEq/L   Glucose, Bld 88  70 - 99 mg/dL   BUN 31 (*) 6 - 23 mg/dL   Creatinine, Ser 1.61 (*) 0.50 - 1.10 mg/dL   Calcium 9.8  8.4 - 09.6 mg/dL   Total Protein 7.5  6.0 - 8.3 g/dL   Albumin 3.4 (*) 3.5 - 5.2 g/dL   AST 36  0 - 37 U/L   ALT 50 (*) 0 - 35 U/L   Alkaline Phosphatase 50  39 - 117 U/L   Total Bilirubin 0.4  0.3 - 1.2 mg/dL   GFR calc non Af Amer 38 (*) >90 mL/min   GFR calc Af Amer 43 (*) >90 mL/min   Comment:            The eGFR has been calculated     using the CKD EPI equation.     This calculation has not been     validated in all clinical     situations.     eGFR's persistently     <90 mL/min signify     possible Chronic Kidney Disease.  AMMONIA     Status: None   Collection Time    12/09/12  3:02 PM      Result Value Range   Ammonia 19  11 - 60 umol/L  TROPONIN I     Status: None   Collection Time    12/09/12  3:02 PM      Result Value Range   Troponin I <0.30  <0.30 ng/mL   Comment:            Due to the release kinetics of cTnI,     a negative result within the first hours     of the onset of symptoms does not rule out     myocardial infarction with certainty.     If myocardial infarction is still suspected,     repeat the test at appropriate intervals.  URINALYSIS, ROUTINE W REFLEX MICROSCOPIC     Status: Abnormal   Collection Time    12/09/12  3:30 PM      Result Value Range   Color, Urine YELLOW  YELLOW   APPearance CLEAR  CLEAR   Specific Gravity, Urine 1.010  1.005 - 1.030   pH 7.0  5.0 - 8.0   Glucose, UA NEGATIVE  NEGATIVE mg/dL   Hgb urine dipstick NEGATIVE  NEGATIVE   Bilirubin Urine NEGATIVE  NEGATIVE   Ketones, ur NEGATIVE  NEGATIVE mg/dL   Protein, ur 30 (*) NEGATIVE mg/dL    Urobilinogen, UA 0.2  0.0 - 1.0 mg/dL   Nitrite NEGATIVE  NEGATIVE   Leukocytes, UA NEGATIVE  NEGATIVE  URINE MICROSCOPIC-ADD ON     Status: None   Collection Time    12/09/12  3:30 PM      Result Value Range   WBC, UA 0-2  <3 WBC/hpf  GLUCOSE, CAPILLARY     Status: Abnormal   Collection Time    12/09/12  5:03 PM      Result Value Range   Glucose-Capillary 103 (*) 70 - 99 mg/dL  GLUCOSE, CAPILLARY     Status: Abnormal   Collection Time    12/09/12  8:15 PM      Result Value Range   Glucose-Capillary 141 (*) 70 - 99 mg/dL    Radiology Reports: Dg Chest 2 View  12/09/2012   *RADIOLOGY REPORT*  Clinical Data: Altered mental status.  CHEST - 2 VIEW  Comparison: PA and lateral chest 10/13/2012.  Findings: The lungs are clear.  Heart size is normal.  No pneumothorax or pleural effusion.  No focal bony abnormality.  IMPRESSION: No acute disease.   Original Report Authenticated By: Holley Dexter, M.D.   Ct Head Wo Contrast  12/09/2012   *RADIOLOGY REPORT*  Clinical Data: Altered mental status  CT HEAD WITHOUT CONTRAST  Technique:  Contiguous axial images were obtained from the base of the skull through the vertex without contrast.  Comparison: MRI brain and 12/06/2012, CT 12/16/2011  Findings: Again demonstrate remote infarction in the right cerebellum.  Extensive atrophy and microvascular disease not changed from MRI 3 days prior.  No acute intracranial hemorrhage. No CT evidence of acute cortical infarction.  No midline shift or mass effect.  No hydrocephalus.  There is again demonstrated opacification of the right mastoid air cells.  IMPRESSION:  1.  No change in short interval follow-up MRI of 12/06/2012. 2.  No acute intracranial findings. 3.  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 4.  Extensive microvascular disease and atrophy.   Original Report Authenticated By: Genevive Bi, M.D.    Electrocardiogram: EKG shows sinus bradycardia at 49 beats per minute. Left axis deviation is  noted. T inversion in lead 1 and aVL. No Q waves. No other concerning ST changes are seen.  Problem List  Principal Problem:   Altered mental status Active Problems:   HYPOTHYROIDISM   HYPERLIPIDEMIA, hx of statin induced myosistis   HYPERTENSIVE CARDIOVASCULAR DISEASE   Obstructive sleep apnea on nocturnal CPAP   Diabetes mellitus   Hallucination, visual   Accelerated hypertension   Assessment: This is a 67 year old, Caucasian female, who has persistent hallucinations, as well as high blood pressure. Reason for her hallucinations not entirely clear. She was thought to have had a TIA. Her symptoms are still persisting. She was also found to have elevated blood pressure this morning. Hypertension could be causing some of her symptoms.  Plan: #1 Hallucinations/altered mental status: UA does not reveal UTI. There is no indication to repeat imaging studies at this time. We will however get a neurological input again. EEG was done during previous hospitalization, but the results are not available yet. Monitor patient on telemetry.  #2 accelerated hypertension: Utilize when necessary medications. Resume her home medication regimen.   #3 history of CVA with the possible recent TIA: Continue with Plavix. She appears to be still on aspirin. Will defer this to the neurologist tomorrow morning. Does not have any new focal deficits at this time that I can determine.  #4 history of diabetes mellitus, type II: Continue with Lantus at a lower dose. Initiate sliding scale coverage.  #5 obstructive sleep apnea: Continue with CPAP at nighttime.  DVT Prophylaxis: Lovenox Code Status: Full code Family Communication: Discussed with the  patient  Disposition Plan: Observation on telemetry. Dr. Renard Matter, will assume care in the morning   Further management decisions will depend on results of further testing and patient's response to treatment.  [facility]  [facility] Pager [phone]  If  7PM-7AM, please contact night-coverage [email or web address] Password [facility]  12/09/2012, 8:39 PM

## 2012-12-09 NOTE — ED Notes (Signed)
Hospitalist at bedside 

## 2012-12-09 NOTE — ED Notes (Signed)
Family out to desk asking for family member to be moved to another room, " a cooler room". Family was provided with a fan

## 2012-12-10 LAB — COMPREHENSIVE METABOLIC PANEL
Albumin: 3.1 g/dL — ABNORMAL LOW (ref 3.5–5.2)
BUN: 29 mg/dL — ABNORMAL HIGH (ref 6–23)
CO2: 24 mEq/L (ref 19–32)
Chloride: 100 mEq/L (ref 96–112)
Creatinine, Ser: 1.46 mg/dL — ABNORMAL HIGH (ref 0.50–1.10)
GFR calc Af Amer: 42 mL/min — ABNORMAL LOW (ref >90)
GFR calc non Af Amer: 37 mL/min — ABNORMAL LOW (ref >90)
Glucose, Bld: 200 mg/dL — ABNORMAL HIGH (ref 70–99)
Total Bilirubin: 0.2 mg/dL — ABNORMAL LOW (ref 0.3–1.2)

## 2012-12-10 LAB — GLUCOSE, CAPILLARY
Glucose-Capillary: 216 mg/dL — ABNORMAL HIGH (ref 70–99)
Glucose-Capillary: 190 mg/dL — ABNORMAL HIGH (ref 70–99)
Glucose-Capillary: 207 mg/dL — ABNORMAL HIGH (ref 70–99)
Glucose-Capillary: 285 mg/dL — ABNORMAL HIGH (ref 70–99)

## 2012-12-10 LAB — CBC
Platelets: 225 10*3/uL (ref 150–400)
RBC: 4.1 MIL/uL (ref 3.87–5.11)
WBC: 6.7 10*3/uL (ref 4.0–10.5)

## 2012-12-10 MED ORDER — CLONIDINE HCL 0.2 MG PO TABS
0.2000 mg | ORAL_TABLET | ORAL | Status: DC | PRN
  Administered 2012-12-10 – 2012-12-11 (×3): 0.2 mg via ORAL
  Filled 2012-12-10 (×4): qty 1

## 2012-12-10 MED ORDER — OMEGA-3-ACID ETHYL ESTERS 1 G PO CAPS
1000.0000 mg | ORAL_CAPSULE | Freq: Two times a day (BID) | ORAL | Status: DC
  Administered 2012-12-10 – 2012-12-12 (×5): 1000 mg via ORAL
  Filled 2012-12-10 (×5): qty 1

## 2012-12-10 MED ORDER — HYDRALAZINE HCL 25 MG PO TABS
25.0000 mg | ORAL_TABLET | Freq: Three times a day (TID) | ORAL | Status: DC
  Administered 2012-12-10 – 2012-12-12 (×7): 25 mg via ORAL
  Filled 2012-12-10: qty 1

## 2012-12-10 MED ORDER — HYDRALAZINE HCL 25 MG PO TABS
25.0000 mg | ORAL_TABLET | Freq: Three times a day (TID) | ORAL | Status: DC
  Administered 2012-12-10 – 2012-12-11 (×2): 25 mg via ORAL
  Filled 2012-12-10 (×7): qty 1

## 2012-12-10 NOTE — Progress Notes (Signed)
[name], [place] NO.:  [id number]  MEDICAL RECORD NO.:  [id number]  LOCATION:  A318                          FACILITY:  [facility]  PHYSICIAN:  Lovelle Deitrick G. Renard Matter, MD   DATE OF BIRTH:  26-Aug-1945  DATE OF PROCEDURE: DATE OF DISCHARGE:                                PROGRESS NOTE   SUBJECTIVE:  This patient is alert today.  She was admitted with chief complaint being hallucination and high blood pressure.  She did have a CT of the head, which showed no change from previous CT.  She does have a remote infarct in right cerebellum and extensive cortical atrophy. Did have hypertension, which is down some this morning.  OBJECTIVE:  GENERAL:  Alert patient. VITAL SIGNS:  Blood pressure 179/60, respirations 20, pulse 56, temp 97.6. HEENT:  Eyes PERRLA.  TMs negative.  Oropharynx benign. NECK:  Supple.  No JVD or thyroid abnormalities. LUNGS:  Clear to P and A. HEART:  Regular rhythm.  No murmurs. ABDOMEN:  No palpable organs or masses. NEUROLOGICAL:  The patient remains alert, oriented.  She has slight weakness of the right extremity secondary to previous stroke.  ASSESSMENT:  The patient was admitted with altered mental status and loss of memory.  She does have hypertension, had recent transient ischemic attack, and hallucinations, previous cerebrovascular accident.  PLAN:  To continue current regimen.  We will attempt to control blood pressure.  We will obtain Neurology consult and possible EEG.     Jameer Storie G. Renard Matter, MD     AGM/MEDQ  D:  12/10/2012  T:  12/10/2012  Job:  [id number]

## 2012-12-10 NOTE — Progress Notes (Signed)
Pt placed on CPAP of 7  With no complaint. Will continue to monitor through out the night

## 2012-12-10 NOTE — Care Management Note (Signed)
    Page 1 of 1   12/12/2012     10:17:50 AM   CARE MANAGEMENT NOTE 12/12/2012  Patient:  [name], [name]   Account Number:  [id number]  Date Initiated:  12/10/2012  Documentation initiated by:  Sharrie Rothman  Subjective/Objective Assessment:   Pt admitted from home with altered mental status and hypertension. Pt lives with her husband and will return at discharge. Pt is fairly independent with ADL's. Pt has cpapa and home O2.     Action/Plan:   No CM needs noted.   Anticipated DC Date:  12/12/2012   Anticipated DC Plan:  HOME/SELF CARE      DC Planning Services  CM consult      Choice offered to / List presented to:             Status of service:  Completed, signed off Medicare Important Message given?  YES (If response is "NO", the following Medicare IM given date fields will be blank) Date Medicare IM given:  12/12/2012 Date Additional Medicare IM given:    Discharge Disposition:  HOME/SELF CARE  Per UR Regulation:    If discussed at Long Length of Stay Meetings, dates discussed:    Comments:  12/10/12 1335 Arlyss Queen, RN BSN CM

## 2012-12-10 NOTE — Evaluation (Signed)
Physical Therapy Evaluation Patient Details Name: [name] MRN: [id number] DOB: 04-18-1946 Today's Date: 12/10/2012 Time: 4098-1191 PT Time Calculation (min): 25 min  PT Assessment / Plan / Recommendation History of Present Illness  Pt is admitted with hypertension, hallucinations.    Clinical Impression  Pt is seen again for evaluation and found to have no functional difficulties.  She is currently not having any hallucinations or confusion although she does repeat herself fairly often.  No further PT is needed.    PT Assessment  Patent does not need any further PT services    Follow Up Recommendations  No PT follow up    Does the patient have the potential to tolerate intense rehabilitation      Barriers to Discharge        Equipment Recommendations  None recommended by PT    Recommendations for Other Services     Frequency      Precautions / Restrictions Precautions Precautions: None Restrictions Weight Bearing Restrictions: No   Pertinent Vitals/Pain       Mobility  Bed Mobility Supine to Sit: 7: Independent Transfers Transfers: Sit to Stand;Stand to Sit Sit to Stand: 7: Independent Stand to Sit: 7: Independent Ambulation/Gait Ambulation/Gait Assistance: 7: Independent Ambulation Distance (Feet): 200 Feet Assistive device: None Gait Pattern: Within Functional Limits Stairs: No    Exercises     PT Diagnosis:    PT Problem List:   PT Treatment Interventions:       PT Goals(Current goals can be found in the care plan section) Acute Rehab PT Goals PT Goal Formulation: No goals set, d/c therapy  Visit Information  Last PT Received On: 12/10/12 History of Present Illness: Pt is admitted with hypertension, hallucinations.         Prior Functioning  Home Living Family/patient expects to be discharged to:: Private residence    Cognition  Cognition Arousal/Alertness: Awake/alert Behavior During Therapy: [facility] for tasks  assessed/performed Overall Cognitive Status: Within Functional Limits for tasks assessed    Extremity/Trunk Assessment Lower Extremity Assessment Lower Extremity Assessment: Overall WFL for tasks assessed   Balance Balance Balance Assessed: No (WNL by functional observation)  End of Session PT - End of Session Equipment Utilized During Treatment: Gait belt Activity Tolerance: Patient tolerated treatment well Patient left: in chair  GP     Myrlene Broker L 12/10/2012, 5:30 PM

## 2012-12-10 NOTE — Progress Notes (Signed)
UR chart review completed.  

## 2012-12-11 LAB — GLUCOSE, CAPILLARY: Glucose-Capillary: 214 mg/dL — ABNORMAL HIGH (ref 70–99)

## 2012-12-11 MED ORDER — INSULIN GLARGINE 100 UNIT/ML ~~LOC~~ SOLN
35.0000 [IU] | Freq: Every day | SUBCUTANEOUS | Status: DC
  Administered 2012-12-11: 35 [IU] via SUBCUTANEOUS
  Filled 2012-12-11: qty 0.35

## 2012-12-11 MED ORDER — CLONIDINE HCL 0.3 MG/24HR TD PTWK
0.3000 mg | MEDICATED_PATCH | TRANSDERMAL | Status: DC
  Administered 2012-12-11: 0.3 mg via TRANSDERMAL
  Filled 2012-12-11: qty 1

## 2012-12-11 MED ORDER — ATORVASTATIN CALCIUM 20 MG PO TABS
20.0000 mg | ORAL_TABLET | Freq: Every day | ORAL | Status: DC
  Filled 2012-12-11: qty 1

## 2012-12-11 NOTE — Progress Notes (Signed)
Went by the pt's room twice to put the pt on BIPAP. She decided not to wear it tonight. I encouraged her to have the RN to call me if she changes her mind.

## 2012-12-11 NOTE — Progress Notes (Signed)
Per Dr. Renard Matter, pt called MD about BP being "high".  MD ordered BP recheck and to page with results.  BP taking manually 205/70. Dr. Renard Matter made aware.  Ordered to give PRN clonidine 0.2 mg now.  Stated would be around to see patient this afternoon and would possibly order clonidine patch.  Patient and daughter at bedside made aware.

## 2012-12-11 NOTE — Procedures (Signed)
[facility] Wynema Garoutte A. Gerilyn Pilgrim, MD     [email or web address]        NAMESHAKETA, SERAFIN              ACCOUNT NO.:  [id number]  MEDICAL RECORD NO.:  [id number]  LOCATION:  EE                           FACILITY:  [facility]  PHYSICIAN:  Brynnley Dayrit A. Gerilyn Pilgrim, M.D. DATE OF BIRTH:  October 25, 1945  DATE OF PROCEDURE:  12/06/2012 DATE OF DISCHARGE:  12/06/2012                             EEG INTERPRETATION   INDICATIONS:  This is a 67 year old lady who presents with altered mental status and confusion.  The study is being done to evaluate for nonconvulsive seizures.  MEDICATION:  Aspirin, Xanax, vitamin D, Catapres, Plavix, Lovenox, Lexapro, insulin, Imdur, Synthroid, Protonix.  ANALYSIS:  A 16-channel recording using standard 10/20 measurement is conducted for 21 minutes.  There is a well-formed posterior dominant rhythm of 11-1/2 hertz which attenuates with eye opening.  There is beta activity observed in the frontal areas.  Awake and drowsy activities are recorded.  Photic stimulation and hypoventilation were not carried out. There is no focal or lateralized slowing.  There is no epileptiform activity observed.  IMPRESSION:  Normal recording of awake and drowsy states.     Sabrin Dunlevy A. Gerilyn Pilgrim, M.D.     KAD/MEDQ  D:  12/11/2012  T:  12/11/2012  Job:  [id number]

## 2012-12-11 NOTE — Progress Notes (Signed)
Occupational Therapy Screen  OT orders received. Patient's chart reviewed. Patient is functioning at baseline (independent) and does not show any functional deficits. No further acute OT needs at this time; will sign off.   Limmie Patricia, OTR/L,CBIS  12/11/12 8:36AM

## 2012-12-11 NOTE — Progress Notes (Signed)
Patient ID: [name], female   DOB: [birth year], 67 y.o.   MRN: [id number]  [facility] NEUROLOGY Diana Armijo A. Gerilyn Pilgrim, MD     [email or web address]          [name] is an 67 y.o. female.   Assessment/Plan: 1. Acute confusional state of unclear etiology. Workup has essentially been unremarkable. Medication effect may be a possibility given the negative workup and the associated hallucinations which she has had with her confusional spells. Repeat imaging of the brain has also been unremarkable. No further workup is recommended at this time. However, repeat EEG is suggested if she continues to have spells of confusion. Alternatively, may consider treating her with low-dose Keppra. She should be restarted on her antiplatelet agents.   The patient probably had had some confusion which has come and go. She has had some hallucinations. These are visual hallucinations seeing people that are not there.  GENERAL: She is in no acute distress.  HEENT: Unremarkable.  ABDOMEN: soft  EXTREMITIES: No edema   SKIN: Normal by inspection.    MENTAL STATUS: Alert and oriented. Speech, language and cognition are generally intact. Judgment and insight normal.   CRANIAL NERVES: Pupils are equal, round and reactive to light and accommodation; extra ocular movements are full, there is no significant nystagmus; visual fields are full; upper and lower facial muscles are normal in strength and symmetric, there is no flattening of the nasolabial folds; tongue is midline; uvula is midline; shoulder elevation is normal.  MOTOR: Normal tone, bulk and strength; there is a mild left upper extremity pronator drift.  COORDINATION: Left finger to nose is normal, right finger to nose is normal, No rest tremor; no intention tremor; no postural tremor; no bradykinesia.    Objective: Vital signs in last 24 hours: Temp:  [97.6 F (36.4 C)-98.4 F (36.9 C)] 98.1 F (36.7 C) (07/22 0600) Pulse Rate:  [44-55]  44 (07/22 0600) Resp:  [18] 18 (07/22 0600) BP: (180-210)/(50-78) 180/74 mmHg (07/22 0600) SpO2:  [96 %-98 %] 98 % (07/22 0600)  Intake/Output from previous day:   Intake/Output this shift:   Nutritional status:     Lab Results: No results found for this or any previous visit (from the past 48 hour(s)).  Lipid Panel No results found for this basename: CHOL, TRIG, HDL, CHOLHDL, VLDL, LDLCALC,  in the last 72 hours  Studies/Results: Dg Chest 2 View  12/09/2012   *RADIOLOGY REPORT*  Clinical Data: Altered mental status.  CHEST - 2 VIEW  Comparison: PA and lateral chest 10/13/2012.  Findings: The lungs are clear.  Heart size is normal.  No pneumothorax or pleural effusion.  No focal bony abnormality.  IMPRESSION: No acute disease.   Original Report Authenticated By: Holley Dexter, M.D.   Ct Head Wo Contrast  12/09/2012   *RADIOLOGY REPORT*  Clinical Data: Altered mental status  CT HEAD WITHOUT CONTRAST  Technique:  Contiguous axial images were obtained from the base of the skull through the vertex without contrast.  Comparison: MRI brain and 12/06/2012, CT 12/16/2011  Findings: Again demonstrate remote infarction in the right cerebellum.  Extensive atrophy and microvascular disease not changed from MRI 3 days prior.  No acute intracranial hemorrhage. No CT evidence of acute cortical infarction.  No midline shift or mass effect.  No hydrocephalus.  There is again demonstrated opacification of the right mastoid air cells.  IMPRESSION:  1.  No change in short interval follow-up MRI of 12/06/2012. 2.  No acute intracranial findings.  3.  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 4.  Extensive microvascular disease and atrophy.   Original Report Authenticated By: Genevive Bi, M.D.   Prior to Admission medications   Medication Sig Start Date End Date Taking? Authorizing Provider  ALPRAZolam (XANAX XR) 1 MG 24 hr tablet Take 1 mg by mouth daily as needed. Anxiety   Yes Historical Provider, MD    aspirin 325 MG EC tablet Take 325 mg by mouth daily.   Yes Historical Provider, MD  Biotin 5000 MCG CAPS Take 1 capsule by mouth daily.   Yes Historical Provider, MD  Cholecalciferol (VITAMIN D) 2000 UNITS tablet Take 2,000 Units by mouth daily.   Yes Historical Provider, MD  escitalopram (LEXAPRO) 20 MG tablet Take 20 mg by mouth daily.   Yes Historical Provider, MD  furosemide (LASIX) 40 MG tablet Take 40 mg by mouth 2 (two) times daily.   Yes Historical Provider, MD  insulin glargine (LANTUS) 100 UNIT/ML injection Inject 62 Units into the skin at bedtime.  10/30/11  Yes Calvert Cantor, MD  insulin lispro (HUMALOG) 100 UNIT/ML injection Inject 10-30 Units into the skin 3 (three) times daily before meals. Uses according to a sliding scale at home. 10 units bid and 20 at supper   Yes Historical Provider, MD  isosorbide mononitrate (IMDUR) 60 MG 24 hr tablet Take 60 mg by mouth daily.     Yes Historical Provider, MD  levothyroxine (SYNTHROID, LEVOTHROID) 112 MCG tablet Take 112 mcg by mouth daily.     Yes Historical Provider, MD  nebivolol (BYSTOLIC) 5 MG tablet Take 5 mg by mouth daily.   Yes Historical Provider, MD  Omega-3 Fatty Acids (FISH OIL TRIPLE STRENGTH PO) Take 3,000 mg by mouth 2 (two) times daily.   Yes Historical Provider, MD  potassium chloride SA (K-DUR,KLOR-CON) 20 MEQ tablet Take 1 tablet (20 mEq total) by mouth daily. 10/13/11  Yes Laveda Norman, MD  cloNIDine (CATAPRES) 0.1 MG tablet Take 1 tablet (0.1 mg total) by mouth every 4 (four) hours as needed (SBP over 150). 12/07/12   Alice Reichert, MD  clopidogrel (PLAVIX) 75 MG tablet Take 1 tablet (75 mg total) by mouth daily with breakfast. 12/07/12   Alice Reichert, MD  omeprazole (PRILOSEC) 40 MG capsule Take 40 mg by mouth daily.    Historical Provider, MD     Medications:  Scheduled Meds: Continuous Infusions: PRN Meds:.       LOS: 3 days   [name] A. Gerilyn Pilgrim, M.D.  Diplomate, Biomedical engineer of Psychiatry and Neurology (  Neurology).

## 2012-12-11 NOTE — Progress Notes (Signed)
[name], [name] NO.:  [id number]  MEDICAL RECORD NO.:  [id number]  LOCATION:  A318                          FACILITY:  [facility]  PHYSICIAN:  Dreyton Roessner G. Renard Matter, MD   DATE OF BIRTH:  [birth year]  DATE OF PROCEDURE: DATE OF DISCHARGE:                                PROGRESS NOTE   SUBJECTIVE:  This patient more alert.  She has been having hallucinations and has had high blood pressures.  Additional medication was given yesterday for continued elevation of blood pressure.  The patient does have a remote infarct in right cerebellum and an extensive cortical atrophy.  He does have diabetes type 2 and dyslipidemia.  OBJECTIVE:  VITAL SIGNS:  Blood pressure 194/50, respirations 18, pulse 48, temp 98.4. HEENT:  Eyes, PERRLA.  TM negative.  Oropharynx benign. NECK:  Supple.  No JVD or thyroid abnormalities. LUNGS:  Clear to P and A. HEART:  Regular rhythm.  No murmurs. ABDOMEN:  No palpable organs or masses. NEUROLOGICAL.  The patient remains alert and oriented, slight weakness right upper extremity secondary to previous stroke.  ASSESSMENT:  The patient was admitted with altered mental status, loss of memory.  She continued to have hypertension, may have had a recent transient ischemic attack, with hallucinations, previous cerebrovascular accident.  We will continue to plan to continue to control her blood sugars and blood pressure.  We will obtain Neurology consult and possible EEG.     Majour Frei G. Renard Matter, MD     AGM/MEDQ  D:  12/11/2012  T:  12/11/2012  Job:  [id number]

## 2012-12-12 ENCOUNTER — Ambulatory Visit: Payer: Medicare Other | Admitting: Gastroenterology

## 2012-12-12 LAB — GLUCOSE, CAPILLARY
Glucose-Capillary: 168 mg/dL — ABNORMAL HIGH (ref 70–99)
Glucose-Capillary: 259 mg/dL — ABNORMAL HIGH (ref 70–99)

## 2012-12-12 MED ORDER — ATORVASTATIN CALCIUM 20 MG PO TABS: 20.0000 mg | ORAL_TABLET | Freq: Every day | ORAL | Status: DC

## 2012-12-12 MED ORDER — HYDRALAZINE HCL 25 MG PO TABS: 25.0000 mg | ORAL_TABLET | Freq: Three times a day (TID) | ORAL | Status: DC

## 2012-12-12 NOTE — Discharge Summary (Signed)
[name], [name] NO.:  [id number]  MEDICAL RECORD NO.:  [id number]  LOCATION:                                 FACILITY:  PHYSICIAN:  Arvada Seaborn G. Renard Matter, MD   DATE OF BIRTH:  12/16/1945  DATE OF ADMISSION:  12/09/2012 DATE OF DISCHARGE:  LH                              DISCHARGE SUMMARY   ADDENDUM:  The patient will be taking the following medications; Apresoline 25 mg t.i.d., Lipitor 20 mg daily, Imdur 60 mg daily, Synthroid 112 mcg daily, potassium chloride 20 mEq daily, Humalog insulin 10-30 units t.i.d. before meals on sliding scale, Lantus insulin 62 units daily, Lexapro 20 mg daily, Biotin 5000 mcg capsules daily, furosemide 40 mg b.i.d., Bystolic 5 mg daily, aspirin 325 mg daily, omega-3 fatty acids 3000 b.i.d., vitamin D 2000 units daily, Xanax XR 1 mg daily, Plavix 75 mg daily, Prilosec 40 mg daily.  The patient will also be taking or using clonidine patch 0.3 mg every 24 hours.  This prescription was given to the patient.     Baila Rouse G. Renard Matter, MD     AGM/MEDQ  D:  12/12/2012  T:  12/12/2012  Job:  [id number]

## 2012-12-12 NOTE — Discharge Summary (Signed)
[name], [name] NO.:  [id number]  MEDICAL RECORD NO.:  [id number]  LOCATION:  A318                          FACILITY:  [facility]  PHYSICIAN:  Jacion Dismore G. Allyce Bochicchio, MD   DATE OF BIRTH:  1946-04-28  DATE OF ADMISSION:  12/09/2012 DATE OF DISCHARGE:  07/23/2014LH                              DISCHARGE SUMMARY   3-DAYS' HOSPITALIZATION DIAGNOSES:  Altered mental status, hallucinations, previous cerebrovascular accident, infarct in right cerebellum, possible transient ischemic attack, accelerated hypertension, dyslipidemia, diabetes mellitus type 2, hypothyroidism, and obstructive sleep apnea.  CONDITION:  Stable and improved at the time of her discharge.  This patient was brought into the emergency department on the day of admission with high blood pressure.  No hallucinations.  She called EMS. When they checked her blood pressure was 220 systolic.  She does not remember the events of the morning.  She had been previously hospitalized and went through a TIA workup.  Also had seen neurologist and MRI done did not reveal any acute infarct.  She had mild headaches. She denied any new psychotropic agents.  She does have a prior history of diabetes mellitus, dyslipidemia, pulmonary hypertension with diastolic dysfunction, hypothyroidism, coronary artery disease, previous myocardial infarction.  The patient was subsequently admitted.  PHYSICAL EXAMINATION:  VITAL SIGNS:  Blood pressure was 221/79, respirations 13, pulse 51.  Temp 98.3. GENERAL APPEARANCE:  Patient is alert and cooperative. HEENT:  Eyes PERRLA.  TM negative.  Oropharynx benign. NECK:  Supple.  No JVD or thyroid abnormalities. HEART:  Regular rhythm.  No murmurs. LUNGS:  Clear to P and A. ABDOMEN:  No palpable organs or masses. SKIN:  Warm and dry. NEUROLOGICAL:  Patient is alert.  No cranial nerve deficits.  She had some weakness in right upper extremity.  LABORATORY DATA:  Admission CBC; WBC 7.6,  hemoglobin 13, hematocrit 37.2.  Chemistries; sodium 134, potassium 4.3, chloride 98, CO2 of 27, glucose 88, BUN 31, creatinine 1.43, calcium 9.8, total protein 7.5, albumin 3.4, AST 36, ALT 50, alkaline phosphatase 50, bilirubin 0.4. GFR 38.  Ammonia level 19, troponin less than 0.30.  Urinalysis negative.  Subsequent chemistries on 07/21, sodium 134, potassium 4.3, chloride 106, and CO2 of 24.  BUN 29, creatinine 1.46, calcium 9.3.  RADIOLOGY:  Chest x-ray on admission, no acute disease.  CT of the head without contrast.  No change in short interval followup MRI.  no acute intracranial findings, remote infarction in the right cerebellum, extensive microvascular disease, and atrophy.  HOSPITAL COURSE:  The patient was put on a carb more modified diet initially and was started on sodium chloride 0.9%.  KVO rate.  She was continued on following medications.  Aspirin 325 mg daily, Lipitor 20 mg daily, Plavix 75 mg daily, Lexapro 20 mg daily, furosemide 40 mg b.i.d., Lantus insulin 30 units at bedtime, initially, this was increased to 35 units.  She was also given NovoLog insulin 5 units t.i.d. as needed. She was continued on Imdur 60 mg every 24 hours, Synthroid 112 mcg daily, omega-3 ethyl esters 1000 mg daily, Protonix 80 mg daily, potassium chloride 20 mEq daily.  She was also started on hydralazine 25 mg every 8  hours to help control the blood pressure which persisted and then clonidine was added 0.2 mg every 4 hours as needed.  This was changed to clonidine or Catapres patchy 0.3 mg weekly.  This regimen did finally control her blood pressure.  Blood pressure this morning 116/56. It had been 205/70 yesterday and earlier in the day.  She was seen in consultation by Neurology.  EEG was performed which did not show any abnormality.  She was also seen by Dr. Gerilyn Pilgrim in consultation of Neurology.  He suggested that she continued to have spells of confusion and she might be treated with  low-dose Keppra.  Patient was stable at the time of discharge.     Chance Karam G. Renard Matter, MD     AGM/MEDQ  D:  12/12/2012  T:  12/12/2012  Job:  [id number]

## 2012-12-13 ENCOUNTER — Telehealth: Payer: Self-pay | Admitting: Cardiovascular Disease

## 2012-12-13 NOTE — Telephone Encounter (Signed)
Please call daughter Olegario Messier - [phone]. Discharged from [facility]. Has questions about her medications. Appt w/ dr. Allyson Sabal on 12/21/12.

## 2012-12-13 NOTE — Telephone Encounter (Signed)
Returned call and spoke w/ pt.  Pt stated she wanted Dr. Allyson Sabal to have her records from [facility] before she came in for her appt on August 1st.  Pt informed our office is a part of [place] and Dr. Allyson Sabal has access to her records from [facility].  Pt verbalized understanding.  Pt also c/o "swaying" when she stands. Stated the doctors changed her meds when she was there and her HR has been in the 40s.  Stated BP was in the 200s.  Asked pt to check BP now and it was 143/57 HR 61.  Pt informed BP/HR are good.  Advised she continue to monitor BP and hold BP meds if BP 100/60 or lower.  Pt also advised to stay hydrated and given orthostatic precautions.  Pt informed her body is having to adjust to a lower BP as it had been elevated.  Pt verbalized understanding and agreed w/ plan.  Pt asked that RN call her daughter Olegario Messier.    Call to pt's daughter Olegario Messier and informed as stated above.  Stated she thinks pt called the office prematurely and she has told pt to calm down to allow the meds to work.  Daughter agreed w/ plan.  Pt will keep appt on 8.1.14 w/ Dr. Allyson Sabal as previously scheduled.

## 2012-12-19 ENCOUNTER — Telehealth: Payer: Self-pay | Admitting: *Deleted

## 2012-12-19 ENCOUNTER — Telehealth: Payer: Self-pay | Admitting: Cardiovascular Disease

## 2012-12-19 NOTE — Telephone Encounter (Signed)
Returned call and spoke w/ Olegario Messier, pt's daughter.  Stated pt's BP is 210/90 and pt has prn Clonidine 0.1mg , but won't take it b/c the doctor didn't tell her to.  Reviewed discharge notes from [facility] and records somewhat confusing with discharge meds.  Dr. Allyson Sabal notified and advised pt can take Clonidine 0.1 mg prn for elevated systolic BP.  Pt has appt on Friday w/ Dr. Allyson Sabal.  Call to [facility] and informed.  Stated pt agrees w/ plan and will keep appt w/ Dr. Allyson Sabal on Friday.  Will call back if no change or further advice needed.

## 2012-12-19 NOTE — Telephone Encounter (Signed)
[name] called back.  Stated pt's BP came down to 160/60 and has gone back up to 180/64 since she took the clonidine (0.1 mg).  Stated instructions state pt can take it q4h prn and pt wants to know what Dr. Allyson Sabal wants to do.  Informed Dr. Allyson Sabal has gone for the day and an Extender will be notified for further instructions and RN will call her back.  Verbalized understanding and agreed w/ plan.  Stated pt concerned about blowing her stents w/ the high BP.  Message forwarded to Hinda Glatter, PA-C for further instructions.

## 2012-12-19 NOTE — Telephone Encounter (Signed)
Please call BP is 230/90 -she is on the Clonidine patch.Need to know what to do.

## 2012-12-19 NOTE — Telephone Encounter (Signed)
  Just discharged 7/18 from [facility] after an admission for TIA/ Confusion/ negative MRI. She was readmitted 7/20 with elevated B/P. Discharged on prn Clonidine.   Take Clonidine 01 mg Twice a day. Have B/P check by RN Friday- let Nada Boozer know if there is any problem with her medications or B/P.  Please get a complete list of all medications she is taking when she comes Friday (have her bring her medications).  Corine Shelter PA-C 12/19/2012 4:17 PM

## 2012-12-19 NOTE — Telephone Encounter (Signed)
Returned call and informed pt per instructions by MD/PA.  Pt c/o orthostatic hypotension.  Stated daughter stated BP dropped to 130/40 when standing and she felt like she was going to fall over.  Pt stated her daughter was concerned about her BP dropping when she stands.  Pt advised to schedule appt w/ Extender tomorrow for evaluation and med changes as needed.  Pt stated she would rather wait until Friday to see Dr. Allyson Sabal.  Pt advised to call back tomorrow morning if symptoms persist for an appt w/ an Extender or ER for worsening symptoms tonight.  Pt verbalized understanding and agreed w/ plan.  Pt asked that RN call her daughter.  Call to [facility] and left message with instructions given to pt.  Call back tomorrow or call after hours to speak w/ someone for changes overnight.

## 2012-12-21 ENCOUNTER — Encounter: Payer: Self-pay | Admitting: Cardiovascular Disease

## 2012-12-21 ENCOUNTER — Ambulatory Visit (INDEPENDENT_AMBULATORY_CARE_PROVIDER_SITE_OTHER): Payer: Medicare Other | Admitting: Cardiovascular Disease

## 2012-12-21 VITALS — BP 198/70 | HR 56 | Ht 65.0 in | Wt 184.0 lb

## 2012-12-21 DIAGNOSIS — Z79899 Other long term (current) drug therapy: Secondary | ICD-10-CM

## 2012-12-21 DIAGNOSIS — D689 Coagulation defect, unspecified: Secondary | ICD-10-CM

## 2012-12-21 DIAGNOSIS — I701 Atherosclerosis of renal artery: Secondary | ICD-10-CM

## 2012-12-21 DIAGNOSIS — I739 Peripheral vascular disease, unspecified: Secondary | ICD-10-CM | POA: Insufficient documentation

## 2012-12-21 NOTE — Assessment & Plan Note (Signed)
Patient does complain of right greater than left lower extremity claudication. She had Dopplers performed in our office 11/29/12 revealing a high-frequency signal in her proximal right common iliac artery with an right ABI 0.76. Pencil be to perform abdominal aortography and potential PTA and stenting for lifestyle limiting claudication

## 2012-12-21 NOTE — Progress Notes (Signed)
12/21/2012 [name]   [birth year]  [id number]  Primary Physician Anita Reichert, MD Primary Cardiologist: Anita Gess MD [name]   HPI:  The patient is a 67 year old, moderately overweight, married Caucasian female, mother of 3, grandmother to 6 grandchildren who I last saw in the office 6 months ago. She has a history of normal coronary arteries by cath which I performed January 28, 2011, with normal LV function and diastolic dysfunction. 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 She also has obstructive sleep apnea not on CPAP. She has been followed with renal Dopplers in our office. Dr. Hulda [name] at [facility] follows her hyperlipidemia. 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 Since that time, she has had swelling of her right upper extremity. She also has what was described as a stroke, which she is recovering from. Carotid Dopplers did not show significant internal carotid artery stenosis.   I saw her in the office in approximately 6 months ago. She was recently noted for question stroke at any time hospital. She is chronically short of breath but denies chest pain. She does have a history of normal coronary arteries by cath. Recent lotion we Dopplers revealed a high-frequency signal in the ostium of the right common iliac artery and should does complain of right lower shin" with an ABI of 0.76. My plans be to admit her the night before. Procedure and hydrate her, hold her diuretic and performed abdominal aortography and potential endovascular therapy of her right common iliac artery         Current Outpatient Prescriptions  Medication Sig Dispense Refill  . ALPRAZolam  (XANAX XR) 1 MG 24 hr tablet Take 1 mg by mouth daily as needed. Anxiety      . aspirin EC 81 MG tablet Take 81 mg by mouth daily.      . Biotin 5000 MCG CAPS Take 1 capsule by mouth daily.      . Cholecalciferol (VITAMIN D) 2000 UNITS tablet Take 2,000 Units by mouth daily.      . cloNIDine (CATAPRES - DOSED IN MG/24 HR) 0.3 mg/24hr Place 1 patch onto the skin once a week. Change on Tuesdays      . clopidogrel (PLAVIX) 75 MG tablet Take 1 tablet (75 mg total) by mouth daily with breakfast.  30 tablet  5  . escitalopram (LEXAPRO) 20 MG tablet Take 20 mg by mouth daily.      . furosemide (LASIX) 40 MG tablet Take 40 mg by mouth 2 (two) times daily.      . hydrALAZINE (APRESOLINE) 25 MG tablet Take 1 tablet (25 mg total) by mouth 3 (three) times daily.  90 tablet  5  . insulin glargine (LANTUS) 100 UNIT/ML injection Inject 62 Units into the skin at bedtime.       . isosorbide mononitrate (IMDUR) 60 MG 24 hr tablet Take 60 mg by mouth daily.        Marland Kitchen levothyroxine (SYNTHROID, LEVOTHROID) 112 MCG tablet Take 112 mcg by mouth daily.        . niacin (NIASPAN) 500 MG CR tablet Take 500 mg by mouth at bedtime.      Marland Kitchen NOVOLOG FLEXPEN 100 UNIT/ML SOPN FlexPen       . Omega-3 Fatty Acids (FISH OIL  TRIPLE STRENGTH PO) Take 2 capsules by mouth 2 (two) times daily.       Marland Kitchen omeprazole (PRILOSEC) 40 MG capsule Take 40 mg by mouth daily.      . potassium chloride SA (K-DUR,KLOR-CON) 20 MEQ tablet Take 1 tablet (20 mEq total) by mouth daily.  30 tablet  0   No current facility-administered medications for this visit.    Allergies  Allergen Reactions  . Codeine Anaphylaxis    swelling  . Lidocaine Anaphylaxis  . Atenolol Other (See Comments)    Unknown  . Bystolic (Nebivolol Hcl)     Cannot tolerate greater then 5 mg, causes chest pain  . Ciprofloxacin Itching  . Metformin Nausea And Vomiting  . Metoprolol Other (See Comments)    Burns ears  . Morphine And Related Other (See Comments)    Makes  patient feel like she is floating  . Penicillins Rash    History   Social History  . Marital Status: Married    Spouse Name: N/A    Number of Children: N/A  . Years of Education: N/A   Occupational History  . retired    Social History Main Topics  . Smoking status: Former Smoker -- 1.00 packs/day for 25 years    Types: Cigarettes    Quit date: 05/23/2004  . Smokeless tobacco: Never Used  . Alcohol Use: No  . Drug Use: No  . Sexually Active: Not Currently   Other Topics Concern  . Not on file   Social History Narrative  . No narrative on file     Review of Systems: General: negative for chills, fever, night sweats or weight changes.  Cardiovascular: negative for chest pain, dyspnea on exertion, edema, orthopnea, palpitations, paroxysmal nocturnal dyspnea or shortness of breath Dermatological: negative for rash Respiratory: negative for cough or wheezing Urologic: negative for hematuria Abdominal: negative for nausea, vomiting, diarrhea, bright red blood per rectum, melena, or hematemesis Neurologic: negative for visual changes, syncope, or dizziness All other systems reviewed and are otherwise negative except as noted above.    Blood pressure 198/70, pulse 56, height 5\' 5"  (1.651 m), weight 184 lb (83.462 kg).  General appearance: alert and no distress Neck: no adenopathy, no JVD, supple, symmetrical, trachea midline, thyroid not enlarged, symmetric, no tenderness/mass/nodules and soft bilateral carotid bruits Lungs: clear to auscultation bilaterally Heart: regular rate and rhythm, S1, S2 normal, no murmur, click, rub or gallop Extremities: extremities normal, atraumatic, no cyanosis or edema  EKG not performed today  ASSESSMENT AND PLAN:   Peripheral arterial disease Patient does complain of right greater than left lower extremity claudication. She had Dopplers performed in our office 11/29/12 revealing a high-frequency signal in her proximal right common iliac  artery with an right ABI 0.76. Pencil be to perform abdominal aortography and potential PTA and stenting for lifestyle limiting claudication  RAS (renal artery stenosis), s/p renal artery PTA X 2 Status post bilateral renal artery PTA and stenting with re\re intervention of her left renal artery 01/28/11. She does have labile hypertension. History of recent renal Doppler studies however did not suggest in-stent restenosis.      Anita Gess MD FACP,FACC,FAHA, [facility] 12/21/2012 5:45 PM

## 2012-12-21 NOTE — Assessment & Plan Note (Signed)
Status post bilateral renal artery PTA and stenting with re\re intervention of her left renal artery 01/28/11. She does have labile hypertension. History of recent renal Doppler studies however did not suggest in-stent restenosis.

## 2012-12-21 NOTE — Patient Instructions (Addendum)
Your physician has requested that you have a peripheral vascular angiogram. This exam is performed at the hospital. During this exam IV contrast is used to look at arterial blood flow. Please review the information sheet given for details.  Your physician recommends that you schedule a follow-up appointment in: After procedure

## 2012-12-24 ENCOUNTER — Encounter: Payer: Self-pay | Admitting: Cardiovascular Disease

## 2012-12-26 ENCOUNTER — Telehealth: Payer: Self-pay | Admitting: Cardiovascular Disease

## 2012-12-26 NOTE — Telephone Encounter (Signed)
Dr. Allyson Sabal gave pt rx for clonidine 0.1 mg. She is also wearing a clonidine patch.  She wants to know how high her BP needs to be before she takes the clonidine.Marland KitchenMarland Kitchen

## 2012-12-26 NOTE — Telephone Encounter (Signed)
Returned call.  Pt wanted to know when she is supposed to take the as needed Clonidine.  Stated she doesn't remember being told when to take it depending on what her BP is.  Asked pt if it was discussed at her last OV and pt stated she hadn't seen Dr. Allyson Sabal since she has been out of the hospital.  Pt informed OV noted on last Friday, August 1st.  Pt stated she doesn't remember much since she had a stroke.  Reviewed OV note and no instructions dictated on prn Clonidine.  Confirmed w/ pt that she does have 0.1mg  Clonidine and is also wearing the Clonidine patch.  Pt informed Dr. Allyson Sabal will be notified for further instructions.  Pt verbalized understanding and agreed w/ plan.  BP today 180/54.  Message forwarded to Dr. Allyson Sabal for review and further instructions

## 2012-12-26 NOTE — Telephone Encounter (Signed)
No response from Dr. Allyson Sabal.  Message forwarded to B. Leron Croak, PA-C for further instructions.

## 2012-12-27 NOTE — Telephone Encounter (Signed)
No response.  Message forwarded to Dr. Allyson Sabal for review and further instructions

## 2012-12-28 ENCOUNTER — Telehealth: Payer: Self-pay | Admitting: Pharmacist

## 2012-12-28 NOTE — Telephone Encounter (Signed)
Spoke w/ pt.  See phone note (8.6.14).

## 2012-12-28 NOTE — Telephone Encounter (Signed)
Nose been bleeding-than it clots and cant breathe thru her nose-please call asap.She is also 81 mg os aspirin>

## 2012-12-28 NOTE — Telephone Encounter (Signed)
The below instruction are from [facility] on 12/19/12:  "Take Clonidine 01 mg Twice a day. Have B/P check by RN Friday- let Nada Boozer know if there is any problem with her medications or B/P. Please get a complete list of all medications she is taking when she comes Friday (have her bring her medications). " LUKE KILROY PA-C  12/19/2012  No changes to meds were made according to Dr. Hazle Coca note. [name]

## 2012-12-28 NOTE — Telephone Encounter (Signed)
Returned call and informed pt per instructions by MD/PA.  Pt verbalized understanding and agreed w/ plan.  Pt also asked if she needs to stay on Plavix.  Stated she has been having nosebleeds and it will stop, but when she blows her nose it starts back again.  Pt advised to continue Plavix as directed and to avoid blowing nose for at least 2 hours after nosebleed.  Pt verbalized understanding and agreed w/ plan.  Pt also asked about upcoming procedure.  Informed letter was mailed w/ specific instructions.  Pt stated she did not receive letter.  Informed letter will be mailed again today.  Pt verbalized understanding and agreed w/ plan.

## 2012-12-28 NOTE — Telephone Encounter (Signed)
LM for pt x 2 

## 2012-12-31 ENCOUNTER — Encounter (HOSPITAL_COMMUNITY): Payer: Self-pay | Admitting: Pharmacy Technician

## 2012-12-31 ENCOUNTER — Other Ambulatory Visit (HOSPITAL_COMMUNITY): Payer: Self-pay | Admitting: Cardiology

## 2012-12-31 NOTE — Telephone Encounter (Signed)
Rx was sent to pharmacy electronically. 

## 2013-01-05 LAB — COMPREHENSIVE METABOLIC PANEL
ALT: 40 U/L — ABNORMAL HIGH (ref 0–35)
AST: 31 U/L (ref 0–37)
CO2: 24 mEq/L (ref 19–32)
Chloride: 103 mEq/L (ref 96–112)
Creat: 1.57 mg/dL — ABNORMAL HIGH (ref 0.50–1.10)
Sodium: 138 mEq/L (ref 135–145)
Total Bilirubin: 0.5 mg/dL (ref 0.3–1.2)
Total Protein: 7.1 g/dL (ref 6.0–8.3)

## 2013-01-05 LAB — CBC
HCT: 36.6 % (ref 36.0–46.0)
MCHC: 33.1 g/dL (ref 30.0–36.0)
MCV: 89.5 fL (ref 78.0–100.0)
Platelets: 272 10*3/uL (ref 150–400)
RDW: 15.3 % (ref 11.5–15.5)

## 2013-01-09 ENCOUNTER — Encounter (HOSPITAL_COMMUNITY): Payer: Self-pay | Admitting: General Practice

## 2013-01-09 ENCOUNTER — Observation Stay (HOSPITAL_COMMUNITY)
Admission: RE | Admit: 2013-01-09 | Discharge: 2013-01-11 | Disposition: A | Discharge status: Home / Self Care | Payer: Medicare Other | Source: Ambulatory Visit | Attending: Cardiovascular Disease | Admitting: Cardiovascular Disease

## 2013-01-09 DIAGNOSIS — I708 Atherosclerosis of other arteries: Principal | ICD-10-CM | POA: Insufficient documentation

## 2013-01-09 DIAGNOSIS — Y849 Medical procedure, unspecified as the cause of abnormal reaction of the patient, or of later complication, without mention of misadventure at the time of the procedure: Secondary | ICD-10-CM | POA: Insufficient documentation

## 2013-01-09 DIAGNOSIS — I10 Essential (primary) hypertension: Secondary | ICD-10-CM | POA: Insufficient documentation

## 2013-01-09 DIAGNOSIS — I739 Peripheral vascular disease, unspecified: Secondary | ICD-10-CM

## 2013-01-09 DIAGNOSIS — E1129 Type 2 diabetes mellitus with other diabetic kidney complication: Secondary | ICD-10-CM | POA: Diagnosis present

## 2013-01-09 DIAGNOSIS — Z23 Encounter for immunization: Secondary | ICD-10-CM | POA: Insufficient documentation

## 2013-01-09 DIAGNOSIS — E785 Hyperlipidemia, unspecified: Secondary | ICD-10-CM | POA: Insufficient documentation

## 2013-01-09 DIAGNOSIS — Z794 Long term (current) use of insulin: Secondary | ICD-10-CM | POA: Insufficient documentation

## 2013-01-09 DIAGNOSIS — I700 Atherosclerosis of aorta: Secondary | ICD-10-CM | POA: Insufficient documentation

## 2013-01-09 DIAGNOSIS — E119 Type 2 diabetes mellitus without complications: Secondary | ICD-10-CM | POA: Insufficient documentation

## 2013-01-09 DIAGNOSIS — N179 Acute kidney failure, unspecified: Secondary | ICD-10-CM

## 2013-01-09 DIAGNOSIS — I701 Atherosclerosis of renal artery: Secondary | ICD-10-CM

## 2013-01-09 DIAGNOSIS — T82898A Other specified complication of vascular prosthetic devices, implants and grafts, initial encounter: Secondary | ICD-10-CM | POA: Insufficient documentation

## 2013-01-09 DIAGNOSIS — I82890 Acute embolism and thrombosis of other specified veins: Secondary | ICD-10-CM

## 2013-01-09 DIAGNOSIS — G4733 Obstructive sleep apnea (adult) (pediatric): Secondary | ICD-10-CM | POA: Insufficient documentation

## 2013-01-09 HISTORY — DX: Acute embolism and thrombosis of other specified veins: I82.890

## 2013-01-09 HISTORY — DX: Family history of other specified conditions: Z84.89

## 2013-01-09 HISTORY — DX: Adverse effect of unspecified anesthetic, initial encounter: T41.45XA

## 2013-01-09 HISTORY — DX: Other complications of anesthesia, initial encounter: T88.59XA

## 2013-01-09 HISTORY — DX: Nausea with vomiting, unspecified: R11.2

## 2013-01-09 HISTORY — DX: Other specified postprocedural states: Z98.890

## 2013-01-09 LAB — PROTIME-INR: INR: 1 (ref 0.00–1.49)

## 2013-01-09 LAB — CBC
HCT: 34.7 % — ABNORMAL LOW (ref 36.0–46.0)
MCH: 30.9 pg (ref 26.0–34.0)
MCHC: 34.9 g/dL (ref 30.0–36.0)
RDW: 14 % (ref 11.5–15.5)

## 2013-01-09 LAB — GLUCOSE, CAPILLARY
Glucose-Capillary: 145 mg/dL — ABNORMAL HIGH (ref 70–99)
Glucose-Capillary: 151 mg/dL — ABNORMAL HIGH (ref 70–99)

## 2013-01-09 LAB — BASIC METABOLIC PANEL
CO2: 27 mEq/L (ref 19–32)
Calcium: 9.5 mg/dL (ref 8.4–10.5)
Creatinine, Ser: 1.37 mg/dL — ABNORMAL HIGH (ref 0.50–1.10)
GFR calc Af Amer: 46 mL/min — ABNORMAL LOW (ref >90)

## 2013-01-09 MED ORDER — SODIUM CHLORIDE 0.9 % IJ SOLN: 3.0000 mL | Freq: Two times a day (BID) | INTRAMUSCULAR | Status: DC

## 2013-01-09 MED ORDER — SODIUM CHLORIDE 0.9 % IJ SOLN: 3.0000 mL | INTRAMUSCULAR | Status: DC | PRN

## 2013-01-09 MED ORDER — SODIUM CHLORIDE 0.9 % IV SOLN: 250.0000 mL | INTRAVENOUS | Status: DC | PRN

## 2013-01-09 MED ORDER — INSULIN ASPART 100 UNIT/ML ~~LOC~~ SOLN
0.0000 [IU] | Freq: Three times a day (TID) | SUBCUTANEOUS | Status: DC
  Administered 2013-01-11: 13:00:00 2 [IU] via SUBCUTANEOUS

## 2013-01-09 MED ORDER — CLOPIDOGREL BISULFATE 75 MG PO TABS
75.0000 mg | ORAL_TABLET | Freq: Every day | ORAL | Status: DC
  Administered 2013-01-10: 75 mg via ORAL
  Filled 2013-01-09 (×2): qty 1

## 2013-01-09 MED ORDER — ASPIRIN 81 MG PO CHEW
324.0000 mg | CHEWABLE_TABLET | ORAL | Status: AC
  Administered 2013-01-10: 324 mg via ORAL
  Filled 2013-01-09: qty 4

## 2013-01-09 MED ORDER — INSULIN ASPART 100 UNIT/ML ~~LOC~~ SOLN: 10.0000 [IU] | Freq: Every day | SUBCUTANEOUS | Status: DC

## 2013-01-09 MED ORDER — NIACIN ER 500 MG PO CPCR
500.0000 mg | ORAL_CAPSULE | Freq: Every morning | ORAL | Status: DC
  Administered 2013-01-11: 500 mg via ORAL
  Filled 2013-01-09 (×2): qty 1

## 2013-01-09 MED ORDER — POTASSIUM CHLORIDE CRYS ER 20 MEQ PO TBCR
20.0000 meq | EXTENDED_RELEASE_TABLET | Freq: Every day | ORAL | Status: DC
  Administered 2013-01-10 – 2013-01-11 (×2): 20 meq via ORAL
  Filled 2013-01-09 (×2): qty 1

## 2013-01-09 MED ORDER — PNEUMOCOCCAL VAC POLYVALENT 25 MCG/0.5ML IJ INJ
0.5000 mL | INJECTION | INTRAMUSCULAR | Status: AC
  Administered 2013-01-10: 0.5 mL via INTRAMUSCULAR
  Filled 2013-01-09: qty 0.5

## 2013-01-09 MED ORDER — ALPRAZOLAM ER 1 MG PO TB24: 1.0000 mg | ORAL_TABLET | Freq: Every day | ORAL | Status: DC | PRN

## 2013-01-09 MED ORDER — INSULIN ASPART 100 UNIT/ML FLEXPEN: 24.0000 [IU] | PEN_INJECTOR | Freq: Three times a day (TID) | SUBCUTANEOUS | Status: DC

## 2013-01-09 MED ORDER — GLUCAGON HCL (RDNA) 1 MG IJ SOLR: 1.0000 mg | Freq: Once | INTRAMUSCULAR | Status: AC | PRN

## 2013-01-09 MED ORDER — NIACIN ER (ANTIHYPERLIPIDEMIC) 500 MG PO TBCR: 250.0000 mg | EXTENDED_RELEASE_TABLET | Freq: Two times a day (BID) | ORAL | Status: DC

## 2013-01-09 MED ORDER — NIACIN ER 250 MG PO CPCR
250.0000 mg | ORAL_CAPSULE | Freq: Every day | ORAL | Status: DC
  Administered 2013-01-09 – 2013-01-10 (×2): 250 mg via ORAL
  Filled 2013-01-09 (×4): qty 1

## 2013-01-09 MED ORDER — VITAMIN D 1000 UNITS PO TABS
2000.0000 [IU] | ORAL_TABLET | Freq: Every evening | ORAL | Status: DC
  Administered 2013-01-09 – 2013-01-11 (×2): 2000 [IU] via ORAL
  Filled 2013-01-09 (×3): qty 2

## 2013-01-09 MED ORDER — GLUCAGON (RDNA) 1 MG IJ KIT: 1.0000 mg | PACK | Freq: Once | INTRAMUSCULAR | Status: DC | PRN

## 2013-01-09 MED ORDER — INSULIN GLARGINE 100 UNITS/ML SOLOSTAR PEN: 65.0000 [IU] | PEN_INJECTOR | Freq: Every evening | SUBCUTANEOUS | Status: DC

## 2013-01-09 MED ORDER — ASPIRIN EC 81 MG PO TBEC
81.0000 mg | DELAYED_RELEASE_TABLET | Freq: Every day | ORAL | Status: DC
  Administered 2013-01-10: 81 mg via ORAL
  Filled 2013-01-09 (×2): qty 1

## 2013-01-09 MED ORDER — NEBIVOLOL HCL 2.5 MG PO TABS
2.5000 mg | ORAL_TABLET | Freq: Every day | ORAL | Status: DC
  Administered 2013-01-10 – 2013-01-11 (×2): 2.5 mg via ORAL
  Filled 2013-01-09 (×2): qty 1

## 2013-01-09 MED ORDER — INSULIN ASPART 100 UNIT/ML ~~LOC~~ SOLN
20.0000 [IU] | Freq: Every day | SUBCUTANEOUS | Status: DC
  Administered 2013-01-09: 20 [IU] via SUBCUTANEOUS

## 2013-01-09 MED ORDER — ESCITALOPRAM OXALATE 20 MG PO TABS
20.0000 mg | ORAL_TABLET | Freq: Every day | ORAL | Status: DC
  Administered 2013-01-10 – 2013-01-11 (×2): 20 mg via ORAL
  Filled 2013-01-09 (×2): qty 1

## 2013-01-09 MED ORDER — INSULIN ASPART 100 UNIT/ML ~~LOC~~ SOLN
10.0000 [IU] | Freq: Every day | SUBCUTANEOUS | Status: DC
  Administered 2013-01-11: 13:00:00 10 [IU] via SUBCUTANEOUS

## 2013-01-09 MED ORDER — LEVOTHYROXINE SODIUM 112 MCG PO TABS: 112.0000 ug | ORAL_TABLET | Freq: Every day | ORAL | Status: DC

## 2013-01-09 MED ORDER — PANTOPRAZOLE SODIUM 40 MG PO TBEC
40.0000 mg | DELAYED_RELEASE_TABLET | Freq: Every day | ORAL | Status: DC
  Administered 2013-01-10 – 2013-01-11 (×2): 40 mg via ORAL
  Filled 2013-01-09: qty 1

## 2013-01-09 MED ORDER — SODIUM CHLORIDE 0.9 % IV SOLN
INTRAVENOUS | Status: DC
  Administered 2013-01-09: 75 mL/h via INTRAVENOUS

## 2013-01-09 MED ORDER — HYDRALAZINE HCL 25 MG PO TABS
25.0000 mg | ORAL_TABLET | Freq: Three times a day (TID) | ORAL | Status: DC
  Administered 2013-01-09 – 2013-01-11 (×6): 25 mg via ORAL
  Filled 2013-01-09 (×8): qty 1

## 2013-01-09 MED ORDER — INSULIN GLARGINE 100 UNIT/ML ~~LOC~~ SOLN
62.0000 [IU] | Freq: Every day | SUBCUTANEOUS | Status: DC
  Administered 2013-01-09 – 2013-01-10 (×2): 62 [IU] via SUBCUTANEOUS
  Filled 2013-01-09 (×3): qty 0.62

## 2013-01-09 MED ORDER — ISOSORBIDE MONONITRATE ER 60 MG PO TB24
60.0000 mg | ORAL_TABLET | Freq: Every day | ORAL | Status: DC
  Administered 2013-01-09 – 2013-01-10 (×2): 60 mg via ORAL
  Filled 2013-01-09 (×3): qty 1

## 2013-01-09 MED ORDER — CLONIDINE HCL 0.1 MG PO TABS
0.1000 mg | ORAL_TABLET | Freq: Every day | ORAL | Status: DC | PRN
  Administered 2013-01-09 – 2013-01-11 (×2): 0.1 mg via ORAL
  Filled 2013-01-09 (×2): qty 1

## 2013-01-09 MED ORDER — ALPRAZOLAM 0.25 MG PO TABS
1.0000 mg | ORAL_TABLET | Freq: Every evening | ORAL | Status: DC | PRN
  Administered 2013-01-09 – 2013-01-11 (×2): 1 mg via ORAL
  Filled 2013-01-09: qty 4
  Filled 2013-01-09: qty 1
  Filled 2013-01-09: qty 3

## 2013-01-09 NOTE — Progress Notes (Signed)
[facility]  Subjective: Pt being admitted to observation for pre-cath hydration for planned PV angio tomorrow. No complaints.   Objective: Vital signs in last 24 hours: Temp:  [97.7 F (36.5 C)] 97.7 F (36.5 C) (08/20 1350) Pulse Rate:  [60] 60 (08/20 1350) BP: (178)/(53) 178/53 mmHg (08/20 1350) SpO2:  [97 %] 97 % (08/20 1350) Weight:  [187 lb 12.8 oz (85.186 kg)] 187 lb 12.8 oz (85.186 kg) (08/20 1350)    Intake/Output from previous day:   Intake/Output this shift: Total I/O In: -  Out: 300 [Urine:300]  Medications Current Facility-Administered Medications  Medication Dose Route Frequency Provider Last Rate Last Dose  . [START ON 01/10/2013] pneumococcal 23 valent vaccine (PNU-IMMUNE) injection 0.5 mL  0.5 mL Intramuscular Tomorrow-1000 Runell Gess, MD        PE: General appearance: alert, cooperative and no distress Lungs: clear to auscultation bilaterally Heart: regular rate and rhythm Extremities: no LEE Pulses: 2+ and symmetric Skin: warm and dry Neurologic: Grossly normal   Assessment/Plan  Principal Problem:   Peripheral arterial disease Active Problems:   HYPERLIPIDEMIA, hx of statin induced myosistis   Diabetes mellitus  Plan: Pt being admitted to observation for pre-cath hydration. Pt is scheduled to undergo a PV angio with Dr. Allyson Sabal tomorrow. Will order home meds. Will hold Lasix. Will order BMP, CBC and INR. Will make NPO at midnight. Dr. Allyson Sabal to see in the am.    LOS: 0 days    Brittainy M. Delmer Islam 01/09/2013 4:21 PM   Agree with note written by Jones Skene [facility]  Admitted for hydration prior to [facility] angio  Runell Gess 01/10/2013 8:52 AM

## 2013-01-09 NOTE — Progress Notes (Signed)
Pt has refused CPAP at this time, Pt to call if she changes her mind, RT to monitor and assess as needed.

## 2013-01-10 ENCOUNTER — Encounter (HOSPITAL_COMMUNITY)
Admission: RE | Disposition: A | Discharge status: Home / Self Care | Payer: Self-pay | Source: Ambulatory Visit | Attending: Cardiovascular Disease

## 2013-01-10 DIAGNOSIS — I701 Atherosclerosis of renal artery: Secondary | ICD-10-CM

## 2013-01-10 DIAGNOSIS — I70219 Atherosclerosis of native arteries of extremities with intermittent claudication, unspecified extremity: Secondary | ICD-10-CM

## 2013-01-10 DIAGNOSIS — I10 Essential (primary) hypertension: Secondary | ICD-10-CM

## 2013-01-10 DIAGNOSIS — N189 Chronic kidney disease, unspecified: Secondary | ICD-10-CM

## 2013-01-10 DIAGNOSIS — N179 Acute kidney failure, unspecified: Secondary | ICD-10-CM

## 2013-01-10 HISTORY — PX: PERCUTANEOUS STENT INTERVENTION: SHX5500

## 2013-01-10 HISTORY — PX: LOWER EXTREMITY ANGIOGRAM: SHX5508

## 2013-01-10 LAB — BASIC METABOLIC PANEL
Calcium: 9.4 mg/dL (ref 8.4–10.5)
Creatinine, Ser: 1.49 mg/dL — ABNORMAL HIGH (ref 0.50–1.10)
GFR calc non Af Amer: 36 mL/min — ABNORMAL LOW (ref >90)
Glucose, Bld: 114 mg/dL — ABNORMAL HIGH (ref 70–99)
Sodium: 139 mEq/L (ref 135–145)

## 2013-01-10 LAB — GLUCOSE, CAPILLARY
Glucose-Capillary: 117 mg/dL — ABNORMAL HIGH (ref 70–99)
Glucose-Capillary: 97 mg/dL (ref 70–99)

## 2013-01-10 LAB — CBC
MCH: 30.6 pg (ref 26.0–34.0)
MCHC: 34.1 g/dL (ref 30.0–36.0)
Platelets: 208 10*3/uL (ref 150–400)
RBC: 3.73 MIL/uL — ABNORMAL LOW (ref 3.87–5.11)
RDW: 14 % (ref 11.5–15.5)

## 2013-01-10 LAB — SURGICAL PCR SCREEN
MRSA, PCR: NEGATIVE
Staphylococcus aureus: NEGATIVE

## 2013-01-10 LAB — POCT ACTIVATED CLOTTING TIME: Activated Clotting Time: 217 seconds

## 2013-01-10 SURGERY — ANGIOGRAM, LOWER EXTREMITY
Anesthesia: LOCAL | Laterality: Right

## 2013-01-10 MED ORDER — HEPARIN SODIUM (PORCINE) 1000 UNIT/ML IJ SOLN
INTRAMUSCULAR | Status: AC
  Filled 2013-01-10: qty 1

## 2013-01-10 MED ORDER — CLOPIDOGREL BISULFATE 75 MG PO TABS
75.0000 mg | ORAL_TABLET | Freq: Every day | ORAL | Status: DC
  Administered 2013-01-11: 09:00:00 75 mg via ORAL
  Filled 2013-01-10: qty 1

## 2013-01-10 MED ORDER — MIDAZOLAM HCL 2 MG/2ML IJ SOLN
INTRAMUSCULAR | Status: AC
  Filled 2013-01-10: qty 2

## 2013-01-10 MED ORDER — SODIUM CHLORIDE 0.9 % IV SOLN: INTRAVENOUS | Status: AC

## 2013-01-10 MED ORDER — FENTANYL CITRATE 0.05 MG/ML IJ SOLN
25.0000 ug | INTRAMUSCULAR | Status: DC | PRN
  Administered 2013-01-10 (×2): 50 ug via INTRAVENOUS
  Filled 2013-01-10: qty 2

## 2013-01-10 MED ORDER — HYDRALAZINE HCL 20 MG/ML IJ SOLN
INTRAMUSCULAR | Status: AC
  Filled 2013-01-10: qty 1

## 2013-01-10 MED ORDER — ASPIRIN EC 325 MG PO TBEC
325.0000 mg | DELAYED_RELEASE_TABLET | Freq: Every day | ORAL | Status: DC
  Administered 2013-01-11: 325 mg via ORAL
  Filled 2013-01-10: qty 1

## 2013-01-10 MED ORDER — ONDANSETRON HCL 4 MG/2ML IJ SOLN
4.0000 mg | Freq: Four times a day (QID) | INTRAMUSCULAR | Status: DC | PRN
  Administered 2013-01-10: 4 mg via INTRAVENOUS
  Filled 2013-01-10: qty 2

## 2013-01-10 MED ORDER — ONDANSETRON HCL 4 MG/2ML IJ SOLN
INTRAMUSCULAR | Status: AC
  Filled 2013-01-10: qty 2

## 2013-01-10 MED ORDER — HEPARIN (PORCINE) IN NACL 2-0.9 UNIT/ML-% IJ SOLN
INTRAMUSCULAR | Status: AC
  Filled 2013-01-10: qty 1000

## 2013-01-10 MED ORDER — FENTANYL CITRATE 0.05 MG/ML IJ SOLN: 25.0000 ug/kg | Freq: Once | INTRAMUSCULAR | Status: DC

## 2013-01-10 MED ORDER — HYDRALAZINE HCL 20 MG/ML IJ SOLN
10.0000 mg | INTRAMUSCULAR | Status: DC | PRN
  Administered 2013-01-10 – 2013-01-11 (×3): 10 mg via INTRAVENOUS
  Filled 2013-01-10 (×2): qty 1

## 2013-01-10 MED ORDER — LEVOTHYROXINE SODIUM 112 MCG PO TABS
112.0000 ug | ORAL_TABLET | Freq: Every day | ORAL | Status: DC
  Administered 2013-01-11: 07:00:00 112 ug via ORAL
  Filled 2013-01-10 (×2): qty 1

## 2013-01-10 MED ORDER — CHLOROPROCAINE HCL 3 % IJ SOLN
20.0000 mL | INTRAMUSCULAR | Status: AC
  Administered 2013-01-10: 600 mg
  Filled 2013-01-10: qty 20

## 2013-01-10 MED ORDER — ACETAMINOPHEN 325 MG PO TABS: 650.0000 mg | ORAL_TABLET | ORAL | Status: DC | PRN

## 2013-01-10 NOTE — Care Management Note (Unsigned)
    Page 1 of 1   01/10/2013     3:21:54 PM   CARE MANAGEMENT NOTE 01/10/2013  Patient:  [name], [name]   Account Number:  [id number]  Date Initiated:  01/10/2013  Documentation initiated by:  Kadin Bera  Subjective/Objective Assessment:   PT ADM ON 01/09/13 FOR IV HYDRATION PRIOR TO ANGIOGRAM ON 01/10/13.  PTA, PT INDEPENDENT, LIVES WITH SPOUSE.     Action/Plan:   WILL FOLLOW FOR DC NEEDS AS PT PROGRESSES.   Anticipated DC Date:  01/11/2013   Anticipated DC Plan:  HOME/SELF CARE      DC Planning Services  CM consult      Choice offered to / List presented to:             Status of service:  In process, will continue to follow Medicare Important Message given?   (If response is "NO", the following Medicare IM given date fields will be blank) Date Medicare IM given:   Date Additional Medicare IM given:    Discharge Disposition:    Per UR Regulation:  Reviewed for med. necessity/level of care/duration of stay  If discussed at Long Length of Stay Meetings, dates discussed:    Comments:

## 2013-01-10 NOTE — H&P (Signed)
   Pt was reexamined and existing H & P reviewed. No changes found.  Runell Gess, MD [facility] 01/10/2013 2:27 PM

## 2013-01-10 NOTE — CV Procedure (Signed)
[name] is a 67 y.o. female    [id number] LOCATION:  FACILITY: [facility]  PHYSICIAN: Nanetta Batty, M.D. Jul 18, 1945   DATE OF PROCEDURE:  01/10/2013  DATE OF DISCHARGE:   CARDIAC CATHETERIZATION     History obtained from chart review.The patient is a 67 year old, moderately overweight, married Caucasian female, mother of 3, grandmother to 6 grandchildren who I last saw in the office 6 months ago. She has a history of normal coronary arteries by cath which I performed January 28, 2011, with normal LV function and diastolic dysfunction. 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 She also has obstructive sleep apnea not on CPAP. She has been followed with renal Dopplers in our office. Dr. Hulda Marin at [facility] follows her hyperlipidemia. 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 Since that time, she has had swelling of her right upper extremity. She also has what was described as a stroke, which she is recovering from. Carotid Dopplers did not show significant internal carotid artery stenosis.  I saw her in the office in approximately 6 months ago. She was recently noted for question stroke at any time hospital. She is chronically short of breath but denies chest pain. She does have a history of normal coronary arteries by cath. Recent lotion we Dopplers revealed a high-frequency signal in the ostium of the right common iliac artery and should does complain of right lower shin" with an ABI of 0.76. My plans be to admit her the night before. Procedure and hydrate her, hold her diuretic and performed abdominal aortography and potential endovascular therapy of her right common iliac artery    PROCEDURE  DESCRIPTION:    The patient was brought to the second floor [facility] cath lab in the postabsorptive state. She was premedicated with Valium 5 mg by mouth and IV Versed. Her right groin was prepped and shaved in usual sterile fashion. Xylocaine 1% was used for local anesthesia. A 5 French sheath was inserted into the right common femoral  artery using standard Seldinger technique. 5 French pigtail catheter was used for midstream abdominal aortography, distal abdominal aortography with bilateral iliac and angiography. A 4 [place] and catheter was used to obtain a pullback gradient after administration of 200 mcg of intra-arterial occlusion. There was 100 mm of mercury gradient across the ostial right common iliac artery stenosis.   HEMODYNAMICS:    AO SYSTOLIC/AO DIASTOLIC: 227/67     ANGIOGRAPHIC RESULTS:   1. Midstream abdominal aortogram-renal arteries were widely patent. The infrarenal abdominal aorta had a moderate amount of atherosclerotic changes.  2: Distal abdominal aorta Gram-there was a 70-80% ostial right common iliac artery stenosis. 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 Will proceed with PTA and stenting using a balloon-expandable stent.  Procedure description: A 5 French sheath was exchanged over an 035 wire for a 7 French Bright tip sheath . The patient received 6000 units of heparin with an ACT of 227. Total contrast administered the patient was 96 cc. The lesion was predilated with a 5 x 2 balloon. He was then stented with an 8 mm x 39 mm balloon with minimal stent at nominal pressures resulting in reduction of an 80% ostial right common iliac artery stenosis  to 0% residual with excellent stent apposition. The patient tolerated the procedure well. The sheath was secured and will be removed once the ACT falls below 170. The patient did receive hydralazine IV in  the Cath Lab for elevated blood pressure.  Runell Gess MD, [facility] 01/10/2013 3:12 PM

## 2013-01-10 NOTE — Progress Notes (Signed)
Subjective:  No CP/SOB  Objective:  Temp:  [97.7 F (36.5 C)-98.5 F (36.9 C)] 98.5 F (36.9 C) (08/21 0436) Pulse Rate:  [57-62] 57 (08/21 0436) Resp:  [16] 16 (08/21 0436) BP: (162-188)/(45-70) 162/49 mmHg (08/21 0436) SpO2:  [95 %-98 %] 95 % (08/21 0436) Weight:  [187 lb 12.8 oz (85.186 kg)] 187 lb 12.8 oz (85.186 kg) (08/20 1350) Weight change:   Intake/Output from previous day: 08/20 0701 - 08/21 0700 In: -  Out: 800 [Urine:800]  Intake/Output from this shift:    Physical Exam: General appearance: alert and no distress Neck: no adenopathy, no carotid bruit, no JVD, supple, symmetrical, trachea midline and thyroid not enlarged, symmetric, no tenderness/mass/nodules Lungs: clear to auscultation bilaterally Heart: regular rate and rhythm, S1, S2 normal, no murmur, click, rub or gallop Extremities: extremities normal, atraumatic, no cyanosis or edema  Lab Results: Results for orders placed during the hospital encounter of 01/09/13 (from the past 48 hour(s))  GLUCOSE, CAPILLARY     Status: Abnormal   Collection Time    01/09/13  4:42 PM      Result Value Range   Glucose-Capillary 145 (*) 70 - 99 mg/dL  BASIC METABOLIC PANEL     Status: Abnormal   Collection Time    01/09/13  5:58 PM      Result Value Range   Sodium 139  135 - 145 mEq/L   Potassium 3.8  3.5 - 5.1 mEq/L   Chloride 101  96 - 112 mEq/L   CO2 27  19 - 32 mEq/L   Glucose, Bld 122 (*) 70 - 99 mg/dL   BUN 33 (*) 6 - 23 mg/dL   Creatinine, Ser 2.95 (*) 0.50 - 1.10 mg/dL   Calcium 9.5  8.4 - 62.1 mg/dL   GFR calc non Af Amer 40 (*) >90 mL/min   GFR calc Af Amer 46 (*) >90 mL/min   Comment: (NOTE)     The eGFR has been calculated using the CKD EPI equation.     This calculation has not been validated in all clinical situations.     eGFR's persistently <90 mL/min signify possible Chronic Kidney     Disease.  PROTIME-INR     Status: None   Collection Time    01/09/13  5:58 PM      Result Value Range    Prothrombin Time 13.0  11.6 - 15.2 seconds   INR 1.00  0.00 - 1.49  CBC     Status: Abnormal   Collection Time    01/09/13  5:58 PM      Result Value Range   WBC 8.2  4.0 - 10.5 K/uL   RBC 3.92  3.87 - 5.11 MIL/uL   Hemoglobin 12.1  12.0 - 15.0 g/dL   HCT 30.8 (*) 65.7 - 84.6 %   MCV 88.5  78.0 - 100.0 fL   MCH 30.9  26.0 - 34.0 pg   MCHC 34.9  30.0 - 36.0 g/dL   RDW 96.2  95.2 - 84.1 %   Platelets 238  150 - 400 K/uL  GLUCOSE, CAPILLARY     Status: Abnormal   Collection Time    01/09/13  8:29 PM      Result Value Range   Glucose-Capillary 151 (*) 70 - 99 mg/dL  BASIC METABOLIC PANEL     Status: Abnormal   Collection Time    01/10/13  5:00 AM      Result Value Range   Sodium 139  135 - 145 mEq/L   Potassium 3.8  3.5 - 5.1 mEq/L   Chloride 102  96 - 112 mEq/L   CO2 26  19 - 32 mEq/L   Glucose, Bld 114 (*) 70 - 99 mg/dL   BUN 33 (*) 6 - 23 mg/dL   Creatinine, Ser 1.61 (*) 0.50 - 1.10 mg/dL   Calcium 9.4  8.4 - 09.6 mg/dL   GFR calc non Af Amer 36 (*) >90 mL/min   GFR calc Af Amer 41 (*) >90 mL/min   Comment: (NOTE)     The eGFR has been calculated using the CKD EPI equation.     This calculation has not been validated in all clinical situations.     eGFR's persistently <90 mL/min signify possible Chronic Kidney     Disease.  CBC     Status: Abnormal   Collection Time    01/10/13  5:00 AM      Result Value Range   WBC 6.4  4.0 - 10.5 K/uL   RBC 3.73 (*) 3.87 - 5.11 MIL/uL   Hemoglobin 11.4 (*) 12.0 - 15.0 g/dL   HCT 04.5 (*) 40.9 - 81.1 %   MCV 89.5  78.0 - 100.0 fL   MCH 30.6  26.0 - 34.0 pg   MCHC 34.1  30.0 - 36.0 g/dL   RDW 91.4  78.2 - 95.6 %   Platelets 208  150 - 400 K/uL  SURGICAL PCR SCREEN     Status: None   Collection Time    01/10/13  5:04 AM      Result Value Range   MRSA, PCR NEGATIVE  NEGATIVE   Staphylococcus aureus NEGATIVE  NEGATIVE   Comment:            The Xpert SA Assay (FDA     approved for NASAL specimens     in patients over 10  years of age),     is one component of     a comprehensive surveillance     program.  Test performance has     been validated by [employer] for patients greater     than or equal to 59 year old.     It is not intended     to diagnose infection nor to     guide or monitor treatment.    Imaging: Imaging results have been reviewed  Assessment/Plan:   1. Principal Problem: 2.   Peripheral arterial disease 3. Active Problems: 4.   HYPERLIPIDEMIA, hx of statin induced myosistis 5.   Diabetes mellitus 6.   Time Spent Directly with Patient:  20 minutes  Length of Stay:  LOS: 1 day   Pt admitted for hydration prior to PV angio. Scr actually slightly increased. Will access Right groin. Exam benign.  Runell Gess 01/10/2013, 8:56 AM

## 2013-01-11 ENCOUNTER — Other Ambulatory Visit: Payer: Self-pay | Admitting: Cardiology

## 2013-01-11 DIAGNOSIS — I739 Peripheral vascular disease, unspecified: Secondary | ICD-10-CM

## 2013-01-11 LAB — BASIC METABOLIC PANEL
Calcium: 8.5 mg/dL (ref 8.4–10.5)
GFR calc Af Amer: 39 mL/min — ABNORMAL LOW (ref >90)
GFR calc non Af Amer: 34 mL/min — ABNORMAL LOW (ref >90)
Potassium: 5 mEq/L (ref 3.5–5.1)
Sodium: 138 mEq/L (ref 135–145)

## 2013-01-11 LAB — CBC
Platelets: 206 10*3/uL (ref 150–400)
RBC: 3.61 MIL/uL — ABNORMAL LOW (ref 3.87–5.11)
WBC: 6.6 10*3/uL (ref 4.0–10.5)

## 2013-01-11 LAB — GLUCOSE, CAPILLARY: Glucose-Capillary: 146 mg/dL — ABNORMAL HIGH (ref 70–99)

## 2013-01-11 MED ORDER — CLOPIDOGREL BISULFATE 75 MG PO TABS: 75.0000 mg | ORAL_TABLET | Freq: Every day | ORAL | Status: DC

## 2013-01-11 MED ORDER — PANTOPRAZOLE SODIUM 40 MG PO TBEC: 40.0000 mg | DELAYED_RELEASE_TABLET | Freq: Every day | ORAL | Status: DC

## 2013-01-11 MED ORDER — HYDRALAZINE HCL 25 MG PO TABS: 37.5000 mg | ORAL_TABLET | Freq: Three times a day (TID) | ORAL | Status: DC

## 2013-01-11 MED ORDER — HYDRALAZINE HCL 25 MG PO TABS: 12.5000 mg | ORAL_TABLET | Freq: Once | ORAL | Status: DC

## 2013-01-11 NOTE — Discharge Summary (Signed)
Seen in post procedure unit.    Needed BP controlled & to ambulate.  Hydralazine increased.  BP better.  Ready for d/c home post PV PCA.  Marykay Lex, MD

## 2013-01-11 NOTE — Progress Notes (Signed)
[facility]  Subjective: Denies leg, groin, back and flank pain.   Objective: Vital signs in last 24 hours: Temp:  [98.1 F (36.7 C)-98.4 F (36.9 C)] 98.2 F (36.8 C) (08/22 0719) Pulse Rate:  [57-74] 63 (08/22 0719) Resp:  [17-22] 18 (08/22 0719) BP: (111-169)/(28-50) 169/30 mmHg (08/22 0719) SpO2:  [90 %-98 %] 91 % (08/22 0719) Weight:  [187 lb 6.3 oz (85 kg)] 187 lb 6.3 oz (85 kg) (08/22 0500) Last BM Date: 01/10/13  Intake/Output from previous day: 08/21 0701 - 08/22 0700 In: 2727.5 [I.V.:2727.5] Out: -  Intake/Output this shift: Total I/O In: 360 [P.O.:360] Out: -   Medications Current Facility-Administered Medications  Medication Dose Route Frequency Provider Last Rate Last Dose  . acetaminophen (TYLENOL) tablet 650 mg  650 mg Oral Q4H PRN Runell Gess, MD      . ALPRAZolam Prudy Feeler) tablet 1 mg  1 mg Oral QHS PRN Runell Gess, MD   1 mg at 01/11/13 0008  . aspirin EC tablet 325 mg  325 mg Oral Daily Runell Gess, MD   325 mg at 01/11/13 1006  . cholecalciferol (VITAMIN D) tablet 2,000 Units  2,000 Units Oral QPM Brittainy Simmons, PA-C   2,000 Units at 01/11/13 0008  . cloNIDine (CATAPRES) tablet 0.1 mg  0.1 mg Oral Daily PRN Brittainy Simmons, PA-C   0.1 mg at 01/09/13 2059  . clopidogrel (PLAVIX) tablet 75 mg  75 mg Oral Q breakfast Runell Gess, MD   75 mg at 01/11/13 0902  . escitalopram (LEXAPRO) tablet 20 mg  20 mg Oral Daily Brittainy Simmons, PA-C   20 mg at 01/11/13 1006  . fentaNYL (SUBLIMAZE) injection 2,130-4,260 mcg  25-50 mcg/kg Intravenous Once Runell Gess, MD      . fentaNYL (SUBLIMAZE) injection 25-50 mcg  25-50 mcg Intravenous Q2H PRN Runell Gess, MD   50 mcg at 01/10/13 1930  . hydrALAZINE (APRESOLINE) injection 10 mg  10 mg Intravenous PRN Runell Gess, MD   10 mg at 01/10/13 1830  . hydrALAZINE (APRESOLINE) tablet 25 mg  25 mg Oral TID Robbie Lis, PA-C   25 mg at 01/11/13 1006  .  insulin aspart (novoLOG) injection 0-15 Units  0-15 Units Subcutaneous TID WC Brittainy Simmons, PA-C      . insulin aspart (novoLOG) injection 10 Units  10 Units Subcutaneous Q breakfast Runell Gess, MD      . insulin aspart (novoLOG) injection 10 Units  10 Units Subcutaneous Q lunch Runell Gess, MD      . insulin aspart (novoLOG) injection 20 Units  20 Units Subcutaneous Q supper Runell Gess, MD   20 Units at 01/09/13 1848  . insulin glargine (LANTUS) injection 62 Units  62 Units Subcutaneous QHS Runell Gess, MD   62 Units at 01/10/13 2133  . isosorbide mononitrate (IMDUR) 24 hr tablet 60 mg  60 mg Oral QHS Brittainy Simmons, PA-C   60 mg at 01/10/13 2130  . nebivolol (BYSTOLIC) tablet 2.5 mg  2.5 mg Oral Daily Brittainy Simmons, PA-C   2.5 mg at 01/11/13 1007  . niacin CR capsule 250 mg  250 mg Oral QHS Runell Gess, MD   250 mg at 01/10/13 2130  . niacin CR capsule 500 mg  500 mg Oral q morning - 10a Runell Gess, MD   500 mg at 01/11/13 1007  . ondansetron (ZOFRAN) injection 4 mg  4 mg Intravenous  Q6H PRN Runell Gess, MD   4 mg at 01/10/13 1856  . pantoprazole (PROTONIX) EC tablet 40 mg  40 mg Oral Daily Brittainy Simmons, PA-C   40 mg at 01/10/13 1106  . potassium chloride SA (K-DUR,KLOR-CON) CR tablet 20 mEq  20 mEq Oral Daily Brittainy Simmons, PA-C   20 mEq at 01/11/13 1008  . SYNTHROID (brand only) tablet 112 mcg  112 mcg Oral QAC breakfast Runell Gess, MD   112 mcg at 01/11/13 0700    PE: General appearance: alert, cooperative and no distress Lungs: clear to auscultation bilaterally Heart: regular rate and rhythm Extremities: no LEE, right groin: nontender, no ecchymosis, faint bruit Pulses: 2+ and symmetric Skin: warm and dry Neurologic: Grossly normal  Lab Results:   Recent Labs  01/09/13 1758 01/10/13 0500 01/11/13 0455  WBC 8.2 6.4 6.6  HGB 12.1 11.4* 11.1*  HCT 34.7* 33.4* 32.3*  PLT 238 208 206   BMET  Recent Labs   01/09/13 1758 01/10/13 0500 01/11/13 0455  NA 139 139 138  K 3.8 3.8 5.0  CL 101 102 105  CO2 27 26 23   GLUCOSE 122* 114* 84  BUN 33* 33* 31*  CREATININE 1.37* 1.49* 1.57*  CALCIUM 9.5 9.4 8.5   PT/INR  Recent Labs  01/09/13 1758  LABPROT 13.0  INR 1.00   Assessment/Plan   Principal Problem:   Peripheral arterial disease Active Problems:   HYPERLIPIDEMIA, hx of statin induced myosistis   Diabetes mellitus  Plan: Day 1 s/p stenting of the right common iliac artery. SCr is slightly elevated compared to yesterday at 1.57 (1.49 yesterday). She has a faint right femoral bruit on exam, but groin is otherwise stable. ? If this is related to PVD. She is hypertensive with SBP in the upper 160s. Would like to see better control of BP prior to discharge. She was just given morning meds. Will give time to take effect and will recheck BP. If improved will discharge home today.      LOS: 2 days    Brittainy M. Delmer Islam 01/11/2013 10:37 AM  I have seen and evaluated the patient this AM along with Boyce Medici, PA. I agree with her findings, examination as well as impression recommendations.  Doing well from post PV PCA standpoint, but has yet to walk in the hall & SBP is still very high.  Will dose with (her usual PRN clonidine 0.1 mg) & increase standing hydralazine to 37.5 mg bid.  Needs to ambulate & see SBP < 170 prior to d/c.  Plan is d/c & f/u with Dr. Dorma Russell after LEA dopplers.  MD Time with pt: 10 min  Numair Masden W, M.D., M.S. [facility] [address] [place], [place]  [address]  [phone] Pager # [phone] 01/11/2013 11:58 AM

## 2013-01-11 NOTE — Discharge Summary (Signed)
Physician Discharge Summary  Patient ID: [name] MRN: [id number] DOB/AGE: Jun 26, 1945 67 y.o.  Admit date: 01/09/2013 Discharge date: 01/11/2013  Admission Diagnoses: PVD  Discharge Diagnoses:  Principal Problem:   Peripheral arterial disease -s/p stenting of right CIA Active Problems:   HYPERLIPIDEMIA, hx of statin induced myosistis   Diabetes mellitus   Discharged Condition: stable  Hospital Course: The patient is a 67 y/o female, followed by Dr. Allyson Sabal, who was admitted to [facility] to undergo a PV angiogram and intervention for lifestyle limiting claudication. Her history is significant for PVOD, status post bilateral renal artery stenting with reintervention using iCAST covered stents for "in-stent restenosis." She has difficult-to-control hypertension, hyperlipidemia, insulin-dependent diabetes and hypothyroidism. She also has obstructive sleep apnea, not on CPAP. She recently saw Dr. Allyson Sabal in clinic for evaluation for claudication. Recent lower extremity dopplers revealed a high-frequency signal in the ostium of the right common iliac artery with an ABI of 0.76. Intervention was recommended. She was admitted on 01/09/13 for pre-cath hydration, then underwent the procedure, which was performed by Dr. Allyson Sabal on 01/10/13. She underwent successful stenting of the right CIA with a balloon expanding stent. She tolerated the procedure well and left the cath lab in stable condition. Post-procedure, the femoral access site remained stable and she had no lower extremity pain with ambulation. Her renal function remained stable. She was however hypertensive with SBP in the low 200s. Her antihypertensives were adjusted and her pressures normalized. She was last seen and examined by Dr. Herbie Baltimore, who determined that she was stable for discharge. She will follow up in clinic with Dr. Allyson Sabal.    Consults: None  Significant Diagnostic Studies:   PV angio 01/10/13 HEMODYNAMICS:  AO SYSTOLIC/AO DIASTOLIC:  227/67  ANGIOGRAPHIC RESULTS:  1. Midstream abdominal aortogram-renal arteries were widely patent. The infrarenal abdominal aorta had a moderate amount of atherosclerotic changes.  2: Distal abdominal aorta Gram-there was a 70-80% ostial right common iliac artery stenosis. 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 Will proceed with PTA and stenting using a balloon-expandable stent.    Treatments: See Hospital Course  Discharge Exam: Blood pressure 145/22, pulse 64, temperature 98.5 F (36.9 C), temperature source Oral, resp. rate 20, height 5\' 5"  (1.651 m), weight 187 lb 6.3 oz (85 kg), SpO2 91.00%.   Disposition: 01-Home or Self Care      Discharge Orders   Future Orders Complete By Expires   Diet - low sodium heart healthy  As directed    Driving Restrictions  As directed    Comments:     No driving for 3 days   Increase activity slowly  As directed    Lifting restrictions  As directed    Comments:     No lifting more than 1/2 gallon of milk for 3 days       Medication List         ALPRAZolam 1 MG 24 hr tablet  Commonly known as:  XANAX XR  Take 1 mg by mouth daily as needed (blood pressure or anxiety). Anxiety     aspirin EC 81 MG tablet  Take 81 mg by mouth daily.     Biotin 5000 MCG Caps  Take 10,000 mcg by mouth daily.     cloNIDine 0.3 mg/24hr patch  Commonly known as:  CATAPRES - Dosed in mg/24 hr  Place 1 patch onto the skin once a week. Change on Tuesdays  cloNIDine 0.1 MG tablet  Commonly known as:  CATAPRES  Take 0.1 mg by mouth daily as needed (when BP is over 170).     clopidogrel 75 MG tablet  Commonly known as:  PLAVIX  Take 1 tablet (75 mg total) by mouth daily with breakfast.     clopidogrel 75 MG tablet  Commonly known as:  PLAVIX  Take 1 tablet (75 mg total) by mouth daily with breakfast.     erythromycin 333 MG EC tablet   Commonly known as:  ERY-TAB  Take 333 mg by mouth 3 (three) times daily.     escitalopram 20 MG tablet  Commonly known as:  LEXAPRO  Take 20 mg by mouth daily.     FISH OIL TRIPLE STRENGTH PO  Take 2 capsules by mouth 2 (two) times daily.     furosemide 40 MG tablet  Commonly known as:  LASIX  Take 80 mg by mouth 2 (two) times daily.     glucagon 1 MG injection  Inject 1 mg into the vein once as needed (low blood sugar).     hydrALAZINE 25 MG tablet  Commonly known as:  APRESOLINE  Take 1.5 tablets (37.5 mg total) by mouth 3 (three) times daily.     insulin aspart 100 UNIT/ML injection  Commonly known as:  novoLOG  Inject 10-20 Units into the skin 3 (three) times daily with meals. Takes 10 units with breakfast, 10 units with lunch, and 20 units with supper     insulin glargine 100 UNIT/ML injection  Commonly known as:  LANTUS  Inject 62 Units into the skin at bedtime.     isosorbide mononitrate 60 MG 24 hr tablet  Commonly known as:  IMDUR  Take 60 mg by mouth at bedtime.     levothyroxine 112 MCG tablet  Commonly known as:  SYNTHROID, LEVOTHROID  Take 112 mcg by mouth daily.     nebivolol 2.5 MG tablet  Commonly known as:  BYSTOLIC  Take 2.5 mg by mouth daily.     niacin 500 MG CR tablet  Commonly known as:  NIASPAN  Take 250-500 mg by mouth 2 (two) times daily. 500mg  in the morning, 250mg  in the afternoon     omeprazole 40 MG capsule  Commonly known as:  PRILOSEC  Take 40 mg by mouth daily.     potassium chloride SA 20 MEQ tablet  Commonly known as:  K-DUR,KLOR-CON  Take 20 mEq by mouth daily.     Vitamin D 2000 UNITS tablet  Take 2,000 Units by mouth every evening.       Follow-up Information   Follow up with Runell Gess, MD. (our office will call you with a follow-up appoitment )    Specialty:  Cardiology   Contact information:   [address] [place] [address] [phone]      TIME SPENT ON DISCHARGE, INCLUDING  PHYSICIAN TIME: >30 MINUTES  Signed: Allayne Butcher, PA-C 01/11/2013, 4:03 PM

## 2013-01-11 NOTE — Progress Notes (Signed)
Site area: right groin  Site Prior to Removal:  Level 0  Pressure Applied For 20 MINUTES    Minutes Beginning at 2000  Manual:   yes  Patient Status During Pull:  WNL  Post Pull Groin Site:  Level 0  Post Pull Instructions Given:  yes  Post Pull Pulses Present:  yes  Dressing Applied:  yes  Comments:

## 2013-01-15 ENCOUNTER — Telehealth (HOSPITAL_COMMUNITY): Payer: Self-pay | Admitting: Cardiovascular Disease

## 2013-01-16 ENCOUNTER — Encounter (HOSPITAL_COMMUNITY): Payer: Self-pay | Admitting: Cardiovascular Disease

## 2013-01-22 ENCOUNTER — Telehealth: Payer: Self-pay | Admitting: *Deleted

## 2013-01-22 ENCOUNTER — Telehealth: Payer: Self-pay | Admitting: Cardiovascular Disease

## 2013-01-22 NOTE — Telephone Encounter (Signed)
Called patient. Line busy.

## 2013-01-22 NOTE — Telephone Encounter (Signed)
Please call blood pressure is up and blood sugar is up too. BP is 202/80 and sometimes it is lower.

## 2013-01-29 ENCOUNTER — Ambulatory Visit (HOSPITAL_COMMUNITY)
Admission: RE | Admit: 2013-01-29 | Discharge: 2013-01-29 | Disposition: A | Discharge status: Home / Self Care | Payer: Medicare Other | Source: Ambulatory Visit | Attending: Cardiology | Admitting: Cardiology

## 2013-01-29 ENCOUNTER — Other Ambulatory Visit: Payer: Self-pay | Admitting: Cardiovascular Disease

## 2013-01-29 DIAGNOSIS — I739 Peripheral vascular disease, unspecified: Secondary | ICD-10-CM

## 2013-01-29 DIAGNOSIS — I70219 Atherosclerosis of native arteries of extremities with intermittent claudication, unspecified extremity: Secondary | ICD-10-CM

## 2013-01-29 DIAGNOSIS — Z09 Encounter for follow-up examination after completed treatment for conditions other than malignant neoplasm: Secondary | ICD-10-CM | POA: Insufficient documentation

## 2013-01-29 NOTE — Progress Notes (Signed)
Right Lower Ext. Arterial Duplex Completed. Taron Mondor, BS, RDMS, RVT  

## 2013-01-29 NOTE — Telephone Encounter (Signed)
Rx was sent to pharmacy electronically. 

## 2013-02-04 ENCOUNTER — Encounter: Payer: Self-pay | Admitting: *Deleted

## 2013-02-06 ENCOUNTER — Ambulatory Visit (INDEPENDENT_AMBULATORY_CARE_PROVIDER_SITE_OTHER): Payer: Medicare Other | Admitting: Cardiology

## 2013-02-06 ENCOUNTER — Encounter: Payer: Self-pay | Admitting: Cardiology

## 2013-02-06 VITALS — BP 200/86 | HR 64 | Ht 66.0 in | Wt 190.8 lb

## 2013-02-06 DIAGNOSIS — I701 Atherosclerosis of renal artery: Secondary | ICD-10-CM

## 2013-02-06 DIAGNOSIS — I739 Peripheral vascular disease, unspecified: Secondary | ICD-10-CM | POA: Insufficient documentation

## 2013-02-06 DIAGNOSIS — Z8673 Personal history of transient ischemic attack (TIA), and cerebral infarction without residual deficits: Secondary | ICD-10-CM

## 2013-02-06 DIAGNOSIS — G459 Transient cerebral ischemic attack, unspecified: Secondary | ICD-10-CM

## 2013-02-06 DIAGNOSIS — I119 Hypertensive heart disease without heart failure: Secondary | ICD-10-CM

## 2013-02-06 DIAGNOSIS — R413 Other amnesia: Secondary | ICD-10-CM | POA: Insufficient documentation

## 2013-02-06 MED ORDER — HYDRALAZINE HCL 50 MG PO TABS: 50.0000 mg | ORAL_TABLET | Freq: Three times a day (TID) | ORAL | Status: DC

## 2013-02-06 NOTE — Assessment & Plan Note (Signed)
Patent on cath 01/10/13.

## 2013-02-06 NOTE — Patient Instructions (Addendum)
Call [facility] regarding your CPAP mask.   Follow up with Vernona Rieger or [place] for a blood pressure check in 1 week.  You have been referred to Dr. Pearlean Brownie at [facility].  Your physician has recommended you make the following change in your medication:  TAKE 2  25mg  tablets (50mg ) total 3 times daily.  A new prescription for 50mg  tablets has been sent to your pharmacy - you will take 1 tablets 3 times daily once you pick those up.   Take Clonidine 0.1mg  once daily for systolic (top number) blood pressure greater than 170.

## 2013-02-06 NOTE — Assessment & Plan Note (Signed)
In July had AMS.  She was seen in [place] and MRI was done.  Thought to be MRI, but since that time her memory-short term continues to deteriorate.  Will consult with Neuro for further management.

## 2013-02-06 NOTE — Assessment & Plan Note (Signed)
Recent claudication and abnormal ABIs to rt lower ext, undergoing  PTA and stent to Rt CIA.  Follow up dopplers are improved.

## 2013-02-06 NOTE — Progress Notes (Signed)
02/06/2013   PCP: Alice Reichert, MD   Chief Complaint  Patient presents with  . Follow-up    post-hosp CVA & clot; stenting of R CIA in august; LEAs 9/9; back pain, chest tightness & SOB (not new - CHF); c/o lightheadedness/dizziness; difficulty controlling blood sugars and BP since iliac stent; has had blood work done - reports usually has infection with elevated CBGs; reports weakness in calves    Primary Cardiologist:Dr. Erlene Quan   HPI:  67 year old, moderately overweight, married Caucasian female, mother of 3, grandmother to 6 grandchildren who is followed by Dr. Allyson Sabal. She has a history of normal coronary arteries by cath which he performed January 28, 2011, with normal LV function and diastolic dysfunction. 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  She has difficult-to-control hypertension, hyperlipidemia, insulin-dependent diabetes and hypothyroidism. She also has obstructive sleep apnea  on CPAP though she is currently having problems with keeping the mask in place.  She has been followed with renal Dopplers in our office. Dr. Hulda Marin at [facility] follows her hyperlipidemia. 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 Since that time, she has had swelling of her right upper extremity. She also has what was described as a stroke/TIA, which she is recovering from. Carotid Dopplers did not show significant internal carotid artery stenosis.   She is here today status post PTA and stent to the right common iliac artery by Dr. Gery Pray. She tolerated the procedure well postoperatively she had significant hypertension requiring IV labetalol and hydralazine. By the day of discharge it was controlled, her blood pressure.  She had instructions to take 0.1 mg  of clonidine on a when necessary basis daily if her systolic blood pressure was greater than 170.  Today her blood pressure is significantly elevated and she thought she was to take the clonidine if the systolic pressure was greater than 200.  Here in the office we had her go ahead and take her afternoon dose of 37.5 mg of hydralazine.  The patient also complains of memory issues, short-term memory loss.  She has difficulty remembering her medications her husband has taken to putting them in containers morning and evening so that she will know to take them as well as her insulin.  She is aware that she will call and tell people-something that happened in the morning and again will call them in the evening to repeat it, having forgotten that she's told them the same story earlier.  She believes the memory issues are increasing instead of improving sensors TIA/CVA.  She has no chest pain and no complaints of shortness of breath.   Allergies  Allergen Reactions  . Codeine Anaphylaxis    swelling  . Lidocaine Anaphylaxis  . Atenolol Other (See Comments)    Unknown  . Bystolic [Nebivolol Hcl]     Cannot tolerate greater then 5 mg, causes chest pain  . Ciprofloxacin Itching and Nausea And Vomiting  . Levothyroxine     Patient reports allergy to generic products of levothyroxine, must have brand  . Metformin Nausea And Vomiting  . Metoprolol Other (See Comments)    Burns ears  . Morphine And Related Other (See Comments)    Makes patient feel like she is floating  . Penicillins Rash  Current Outpatient Prescriptions  Medication Sig Dispense Refill  . ALPRAZolam (XANAX XR) 1 MG 24 hr tablet Take 1 mg by mouth daily as needed (blood pressure or anxiety). Anxiety      . aspirin EC 81 MG tablet Take 81 mg by mouth daily.      . Biotin 5000 MCG CAPS Take 10,000 mcg by mouth daily.       . Cholecalciferol (VITAMIN D) 2000 UNITS tablet Take 2,000 Units by mouth every evening.       . cloNIDine  (CATAPRES - DOSED IN MG/24 HR) 0.3 mg/24hr Place 1 patch onto the skin once a week. Change on Tuesdays      . cloNIDine (CATAPRES) 0.1 MG tablet Take 0.1 mg by mouth daily as needed (when BP is over 170).      . clopidogrel (PLAVIX) 75 MG tablet Take 1 tablet (75 mg total) by mouth daily with breakfast.  30 tablet  11  . escitalopram (LEXAPRO) 20 MG tablet Take 20 mg by mouth daily.      . furosemide (LASIX) 40 MG tablet Take 80 mg by mouth 2 (two) times daily.       . insulin aspart (NOVOLOG) 100 UNIT/ML injection Inject 10-20 Units into the skin 3 (three) times daily with meals. Takes 10 units with breakfast, 10 units with lunch, and 20 units with supper      . insulin glargine (LANTUS) 100 UNIT/ML injection Inject 65 Units into the skin at bedtime.       . isosorbide mononitrate (IMDUR) 60 MG 24 hr tablet TAKE 1 TABLET ONCE DAILY.  30 tablet  11  . levothyroxine (SYNTHROID, LEVOTHROID) 112 MCG tablet Take 112 mcg by mouth daily.        . nebivolol (BYSTOLIC) 2.5 MG tablet Take 2.5 mg by mouth daily.      . niacin (NIASPAN) 500 MG CR tablet Take 250-500 mg by mouth 2 (two) times daily. 500mg  in the morning, 250mg  in the afternoon      . Omega-3 Fatty Acids (FISH OIL TRIPLE STRENGTH PO) Take 2 capsules by mouth 2 (two) times daily.       . pantoprazole (PROTONIX) 40 MG tablet Take 1 tablet (40 mg total) by mouth daily.  30 tablet  5  . potassium chloride SA (K-DUR,KLOR-CON) 20 MEQ tablet Take 20 mEq by mouth daily.      . hydrALAZINE (APRESOLINE) 50 MG tablet Take 1 tablet (50 mg total) by mouth 3 (three) times daily.  270 tablet  3   No current facility-administered medications for this visit.    Past Medical History  Diagnosis Date  . HTN (hypertension)   . Elevated troponin level 09/28/11    Type II MI - not ACS related  . Diastolic dysfunction, left ventricle     with previous Diastolic HF  . Diabetes mellitus   . High cholesterol   . Kidney disease   . Cataracts, bilateral   .  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 Pulmonary HTN-severe- due to diastolic dysfunction 09/29/2011    Moderate to Severe Pulm HTN.  Marland Kitchen Acute renal failure 09/29/2011  . 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 HYPOTHYROIDISM 06/19/2008    Qualifier: Diagnosis of  By: Excell Seltzer CMA, Lawson Fiscal    . Renal artery stenosis     s/p stents (restenosis)  . Subclavian arterial stenosis   . Peripheral neuropathy   . Coronary artery disease   .  Myocardial infarction   . Pericarditis   . Anxiety   . Depression   . Shortness of breath   . CHF (congestive heart failure)   . GERD (gastroesophageal reflux disease)   . Obstructive sleep apnea on nocturnal CPAP 06/20/2008    uses CPAP @ night  . Unstable angina   . Stroke   . Arthritis   . Vertigo   . Complication of anesthesia   . PONV (postoperative nausea and vomiting)   . Family history of anesthesia complication     MOTHER ALSO HAD NAUSEA  . Blood clot in abdominal vein 01/09/2013  . History of nuclear stress test 10/10/2011    lexiscan; no perfusion defects     Past Surgical History  Procedure Laterality Date  . Cardiac catheterization  01/2011    Minimal luminal irregularties  . Laparoscopic tubal ligation    . Renal artery stent Bilateral     x 2 (restenosis intervention with iCAST covered stents)  . Colonoscopy  10/12/2011    Procedure: COLONOSCOPY;  Surgeon: Louis Meckel, MD;  Location: [facility] ENDOSCOPY;  Service: Endoscopy;  Laterality: N/A;  . Esophagogastroduodenoscopy  10/12/2011    Procedure: ESOPHAGOGASTRODUODENOSCOPY (EGD);  Surgeon: Louis Meckel, MD;  Location: [facility] ENDOSCOPY;  Service: Endoscopy;  Laterality: N/A;  . Tubal ligation    . Colonoscopy  10/21/2011    Procedure: COLONOSCOPY;  Surgeon: Hilarie Fredrickson, MD;  Location: [facility] ENDOSCOPY;  Service: Endoscopy;  Laterality: N/A;  . Colonoscopy  10/25/2011    Procedure: COLONOSCOPY;  Surgeon: Beverley Fiedler, MD;   Location: [facility] ENDOSCOPY;  Service: Gastroenterology;  Laterality: N/A;  . Colonoscopy  10/23/2011    Procedure: COLONOSCOPY;  Surgeon: Hilarie Fredrickson, MD;  Location: [facility] ENDOSCOPY;  Service: Endoscopy;  Laterality: N/A;  . Colon surgery    . Back surgery       x 2  . Cataract extraction w/phaco  03/29/2012    Procedure: CATARACT EXTRACTION PHACO AND INTRAOCULAR LENS PLACEMENT (IOC);  Surgeon: Gemma Payor, MD;  Location: [facility] ORS;  Service: Ophthalmology;  Laterality: Right;  CDE: 16.87  . Cataract extraction w/phaco  04/16/2012    Procedure: CATARACT EXTRACTION PHACO AND INTRAOCULAR LENS PLACEMENT (IOC);  Surgeon: Gemma Payor, MD;  Location: [facility] ORS;  Service: Ophthalmology;  Laterality: Left;  CDE:  14.98  . Transthoracic echocardiogram  12/16/2011    EF 55-60%; mod LVH & mod conc LVH, grade 2 diastolic dysfunction; mild MR; mod-severely calcfied MR annulus; LA mildly dilated; RV systolic pressure increased; mild TR    AVW:[id number]:YN colds or fevers, no weight changes Skin:no rashes or ulcers HEENT:no blurred vision, no congestion CV:see HPI PUL:see HPI GI:no diarrhea constipation or melena, no indigestion GU:no hematuria, no dysuria MS:no joint pain, no claudication Neuro:no syncope, no lightheadedness, + short term memory loss Endo:+ diabetes, + thyroid disease  PHYSICAL EXAM BP 200/86  Pulse 64  Ht 5\' 6"  (1.676 m)  Wt 190 lb 12.8 oz (86.546 kg)  BMI 30.81 kg/m2 General:Pleasant affect, NAD Skin:Warm and dry, brisk capillary refill HEENT:normocephalic, sclera clear, mucus membranes moist, mild left mouth droop on smile and frown Neck:supple, no JVD, + BIL. Carotid Rt>Lt bruits  Heart:S1S2 RRR without murmur, gallup, rub or click Lungs:clear without rales, rhonchi, or wheezes WGN:FAOZ, non tender, + BS, do not palpate liver spleen or masses Ext:no lower ext edema, 2+ post tib pulses, 2+ radial pulses Neuro:alert and oriented, MAE, follows commands, + facial symmetry   ASSESSMENT AND  PLAN HYPERTENSIVE CARDIOVASCULAR  DISEASE BP elevated today and pt was confused on when to take her po prn clonidine.  I increased her hydralazine to 50 mg TID and re wrote parameters for clonidine.  She will follow up BP check in 1 week then with Dr. Allyson Sabal in 3 weeks.  RAS (renal artery stenosis), s/p renal artery PTA X 2 Patent on cath 01/10/13.  PAD (peripheral artery disease), now with stent to Rt. CIA for claudication 01/10/13 Recent claudication and abnormal ABIs to rt lower ext, undergoing  PTA and stent to Rt CIA.  Follow up dopplers are improved.   TIA (transient ischemic attack) In July had AMS.  She was seen in [place] and MRI was done.  Thought to be MRI, but since that time her memory-short term continues to deteriorate.  Will consult with Neuro for further management.

## 2013-02-06 NOTE — Assessment & Plan Note (Signed)
BP elevated today and pt was confused on when to take her po prn clonidine.  I increased her hydralazine to 50 mg TID and re wrote parameters for clonidine.  She will follow up BP check in 1 week then with Dr. Allyson Sabal in 3 weeks.

## 2013-02-11 ENCOUNTER — Encounter: Payer: Self-pay | Admitting: Cardiovascular Disease

## 2013-02-12 ENCOUNTER — Encounter: Payer: Self-pay | Admitting: Cardiology

## 2013-02-12 ENCOUNTER — Ambulatory Visit (INDEPENDENT_AMBULATORY_CARE_PROVIDER_SITE_OTHER): Payer: Medicare Other | Admitting: Cardiology

## 2013-02-12 VITALS — BP 194/76 | HR 64 | Ht 66.0 in | Wt 191.8 lb

## 2013-02-12 DIAGNOSIS — I119 Hypertensive heart disease without heart failure: Secondary | ICD-10-CM

## 2013-02-12 NOTE — Patient Instructions (Signed)
Take the clonidine when you get to your car.  Follow up with Dr. Allyson Sabal in 3-4 weeks.  Call if problems.

## 2013-02-12 NOTE — Assessment & Plan Note (Signed)
BP overall improved, elevated today though she has not had her Lasix.  She will take the clonidine when she gets back to her car.

## 2013-02-12 NOTE — Progress Notes (Signed)
02/12/2013   PCP: Alice Reichert, MD   Chief Complaint  Patient presents with  . Follow-up    1 week:  c/o feeling fatigued and wiped out.  States she continues to gain weight. BP this AM 184/72 but did not take the Clonidine because of her appt. here today.  Patients monitor here reads 169/73.    Primary Cardiologist:Dr. Erlene Quan   HPI: 67 year old, moderately overweight, married Caucasian female, mother of 3, grandmother to 6 grandchildren who is followed by Dr. Allyson Sabal. She has a history of normal coronary arteries by cath which he performed January 28, 2011, with normal LV function and diastolic dysfunction. 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 She has difficult-to-control hypertension, hyperlipidemia, insulin-dependent diabetes and hypothyroidism. She also has obstructive sleep apnea on CPAP though she is currently having problems with keeping the mask in place.   She has been followed with renal Dopplers in our office. Dr. Hulda Marin at [facility] follows her hyperlipidemia. 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 Since that time, she has had swelling of her right upper extremity. She also has what was described as a stroke/TIA, which she is recovering from. Carotid Dopplers did not show significant internal carotid artery stenosis.  She is status post recent PTA and stent to the right common iliac artery by Dr. Allyson Sabal. She tolerated the procedure well postoperatively she had significant hypertension requiring IV labetalol and hydralazine. By the day of discharge it was controlled, her blood pressure. She had instructions to take 0.1 mg of clonidine on a when necessary basis daily if her systolic blood pressure was greater than 170.  Hydralazine  was increased on last visit and today here for BP check.  BP per her list is improved.  She still takes the prn clonidine.  She does have appt to see neuro for memory loss that has increased since her TIA/CVA.   Allergies  Allergen Reactions  . Codeine Anaphylaxis    swelling  . Lidocaine Anaphylaxis  . Atenolol Other (See Comments)    Unknown  . Bystolic [Nebivolol Hcl]     Cannot tolerate greater then 5 mg, causes chest pain  . Ciprofloxacin Itching and Nausea And Vomiting  . Levothyroxine     Patient reports allergy to generic products of levothyroxine, must have brand  . Metformin Nausea And Vomiting  . Metoprolol Other (See Comments)    Burns ears  . Morphine And Related Other (See Comments)    Makes patient feel like she is floating  . Penicillins Rash    Current Outpatient Prescriptions  Medication Sig Dispense Refill  . ALPRAZolam (XANAX XR) 1 MG 24 hr tablet Take 1 mg by mouth daily as needed (blood pressure or anxiety). Anxiety      . aspirin EC 81 MG tablet Take 81 mg by mouth daily.      . Biotin 5000 MCG CAPS Take 10,000 mcg by mouth daily.       . Cholecalciferol (VITAMIN D) 2000 UNITS tablet Take 2,000 Units by mouth every evening.       . cloNIDine (CATAPRES - DOSED IN MG/24 HR) 0.3 mg/24hr Place 1 patch onto the skin once a week. Change on Tuesdays      . cloNIDine (CATAPRES) 0.1 MG tablet Take  0.1 mg by mouth daily as needed (when BP is over 170).      . clopidogrel (PLAVIX) 75 MG tablet Take 1 tablet (75 mg total) by mouth daily with breakfast.  30 tablet  11  . escitalopram (LEXAPRO) 20 MG tablet Take 20 mg by mouth daily.      . furosemide (LASIX) 40 MG tablet Take 80 mg by mouth 2 (two) times daily.       . hydrALAZINE (APRESOLINE) 50 MG tablet Take 1 tablet (50 mg total) by mouth 3 (three) times daily.  270 tablet  3  . insulin aspart (NOVOLOG) 100 UNIT/ML injection Inject 10-20 Units into the skin 3 (three) times daily with meals. Takes 10 units with  breakfast, 10 units with lunch, and 20 units with supper      . insulin glargine (LANTUS) 100 UNIT/ML injection Inject 65 Units into the skin at bedtime.       . isosorbide mononitrate (IMDUR) 60 MG 24 hr tablet TAKE 1 TABLET ONCE DAILY.  30 tablet  11  . levothyroxine (SYNTHROID, LEVOTHROID) 112 MCG tablet Take 112 mcg by mouth daily.        . nebivolol (BYSTOLIC) 2.5 MG tablet Take 2.5 mg by mouth daily.      . niacin (NIASPAN) 500 MG CR tablet Take 250-500 mg by mouth 2 (two) times daily. 500mg  in the morning, 250mg  in the afternoon      . Omega-3 Fatty Acids (FISH OIL TRIPLE STRENGTH PO) Take 2 capsules by mouth 2 (two) times daily.       . pantoprazole (PROTONIX) 40 MG tablet Take 1 tablet (40 mg total) by mouth daily.  30 tablet  5  . potassium chloride SA (K-DUR,KLOR-CON) 20 MEQ tablet Take 20 mEq by mouth daily.       No current facility-administered medications for this visit.    Past Medical History  Diagnosis Date  . HTN (hypertension)   . Elevated troponin level 09/28/11    Type II MI - not ACS related  . Diastolic dysfunction, left ventricle     with previous Diastolic HF  . Diabetes mellitus   . High cholesterol   . Kidney disease   . Cataracts, bilateral   . 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 Pulmonary HTN-severe- due to diastolic dysfunction 09/29/2011    Moderate to Severe Pulm HTN.  Marland Kitchen Acute renal failure 09/29/2011  . 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 HYPOTHYROIDISM 06/19/2008    Qualifier: Diagnosis of  By: Excell Seltzer CMA, Lawson Fiscal    . Renal artery stenosis     s/p stents (restenosis)  . Subclavian arterial stenosis   . Peripheral neuropathy   . Coronary artery disease   . Myocardial infarction   . Pericarditis   . Anxiety   . Depression   . Shortness of breath   . CHF (congestive heart failure)   . GERD (gastroesophageal reflux disease)   . Obstructive sleep apnea on nocturnal  CPAP 06/20/2008    uses CPAP @ night  . Unstable angina   . Stroke   . Arthritis   . Vertigo   . Complication of anesthesia   . PONV (postoperative nausea and vomiting)   . Family history of anesthesia complication     MOTHER ALSO HAD NAUSEA  . Blood clot in abdominal vein 01/09/2013  . History of nuclear stress test 10/10/2011    lexiscan; no perfusion defects  Past Surgical History  Procedure Laterality Date  . Cardiac catheterization  01/2011    Minimal luminal irregularties  . Laparoscopic tubal ligation    . Renal artery stent Bilateral     x 2 (restenosis intervention with iCAST covered stents)  . Colonoscopy  10/12/2011    Procedure: COLONOSCOPY;  Surgeon: Louis Meckel, MD;  Location: [facility] ENDOSCOPY;  Service: Endoscopy;  Laterality: N/A;  . Esophagogastroduodenoscopy  10/12/2011    Procedure: ESOPHAGOGASTRODUODENOSCOPY (EGD);  Surgeon: Louis Meckel, MD;  Location: [facility] ENDOSCOPY;  Service: Endoscopy;  Laterality: N/A;  . Tubal ligation    . Colonoscopy  10/21/2011    Procedure: COLONOSCOPY;  Surgeon: Hilarie Fredrickson, MD;  Location: [facility] ENDOSCOPY;  Service: Endoscopy;  Laterality: N/A;  . Colonoscopy  10/25/2011    Procedure: COLONOSCOPY;  Surgeon: Beverley Fiedler, MD;  Location: [facility] ENDOSCOPY;  Service: Gastroenterology;  Laterality: N/A;  . Colonoscopy  10/23/2011    Procedure: COLONOSCOPY;  Surgeon: Hilarie Fredrickson, MD;  Location: [facility] ENDOSCOPY;  Service: Endoscopy;  Laterality: N/A;  . Colon surgery    . Back surgery       x 2  . Cataract extraction w/phaco  03/29/2012    Procedure: CATARACT EXTRACTION PHACO AND INTRAOCULAR LENS PLACEMENT (IOC);  Surgeon: Gemma Payor, MD;  Location: [facility] ORS;  Service: Ophthalmology;  Laterality: Right;  CDE: 16.87  . Cataract extraction w/phaco  04/16/2012    Procedure: CATARACT EXTRACTION PHACO AND INTRAOCULAR LENS PLACEMENT (IOC);  Surgeon: Gemma Payor, MD;  Location: [facility] ORS;  Service: Ophthalmology;  Laterality: Left;  CDE:  14.98  . Transthoracic  echocardiogram  12/16/2011    EF 55-60%; mod LVH & mod conc LVH, grade 2 diastolic dysfunction; mild MR; mod-severely calcfied MR annulus; LA mildly dilated; RV systolic pressure increased; mild TR    ZOX:[id number]:UJ colds or fevers, no weight changes CV:see HPI PUL:see HPI MS:no joint pain, no claudication, pain in her calves that has been going on for some time. Neuro:no syncope, no lightheadedness, she thinks her memory is a little better  Endo:+ diabetes, no thyroid disease, to see nutritionist for her diabetes.   PHYSICAL EXAM BP 194/76  Pulse 64  Ht 5\' 6"  (1.676 m)  Wt 191 lb 12.8 oz (87 kg)  BMI 30.97 kg/m2 General:Pleasant affect, NAD Skin:Warm and dry, brisk capillary refill Heart:S1S2 RRR without murmur, gallup, rub or click Lungs:clear without rales, rhonchi, or wheezes Ext:tr lower ext edema,  2+ radial pulses Neuro:alert and oriented, MAE, follows commands, + facial symmetry   ASSESSMENT AND PLAN HYPERTENSIVE CARDIOVASCULAR DISEASE BP overall improved, elevated today though she has not had her Lasix.  She will take the clonidine when she gets back to her car.      She will follow up with Dr. Allyson Sabal in 3-4 weeks and will see neuro as instructed for memory issues.

## 2013-02-21 ENCOUNTER — Ambulatory Visit: Payer: Self-pay | Admitting: Neurology

## 2013-02-25 ENCOUNTER — Ambulatory Visit (INDEPENDENT_AMBULATORY_CARE_PROVIDER_SITE_OTHER): Payer: Medicare Other | Admitting: Neurology

## 2013-02-25 ENCOUNTER — Encounter: Payer: Self-pay | Admitting: Neurology

## 2013-02-25 VITALS — BP 171/57 | HR 64 | Temp 97.9°F | Ht 65.25 in | Wt 195.0 lb

## 2013-02-25 DIAGNOSIS — I6789 Other cerebrovascular disease: Secondary | ICD-10-CM | POA: Insufficient documentation

## 2013-02-25 DIAGNOSIS — R0989 Other specified symptoms and signs involving the circulatory and respiratory systems: Secondary | ICD-10-CM

## 2013-02-25 DIAGNOSIS — R413 Other amnesia: Secondary | ICD-10-CM

## 2013-02-25 NOTE — Patient Instructions (Addendum)
Continue Plavix for stroke prevention with strict control of hypertension with blood pressure goal below 120/80, diabetes with 1 A1c goal below 6.5% and lipids with LDL cholesterol goal below 70 mg percent. Check EEG and TSH. Check carotid ultrasound and trancranial Doppler studies. Increase Lexapro to 40 mg daily for depression. Return for followup in 2 months or call earlier if necessary.

## 2013-02-25 NOTE — Progress Notes (Signed)
[facility] [address] [place]. [place] [address] [phone]       OFFICE CONSULT NOTE  [name] Date of Birth:  09-10-1945 Medical Record Number:  [id number]   Referring MD:  Butch Penny  Reason for Referral:  Memory loss  HPI:  60 year lady with memory diffficulties following a episode of headache, confusion and headache 2 months ago.She states she went to [facility] and was told she had a stroke but MRI scan 11/29/2012 reviewed by me shows no acute infarct but old bilateral cerebellar infarcts.She reports short term memory loss mainly and difficulty with recent events and retaining new information.She denies current headaches and confusion.She has h/o 2 strokes in last 5 years with mild residual right hand weakness, diminished fine motor skills and numbness.She had B 12 , TSHand RPR checked on 12/05/12 which were fine. She had EEG 12/05/12 which was also fine.2DEcho was fine.She has h/o depression for which she takes Lexapro 20 mg daily and feels it is well controlled. She has no h/o seizures, head injury or family h/o dementia. ROS:   14 system review of systems is positive for weight gain, fatigue,leg swelling,shortness of breath,wheezing,diarrhoea,constipation,feeling hot,increased thirst,joint pain,cramps,memory loss,confusion, increased thirst, flushing,depression,decreased energy, disinterest in activities and insomnia  PMH:  Past Medical History  Diagnosis Date  . HTN (hypertension)   . Elevated troponin level 09/28/11    Type II MI - not ACS related  . Diastolic dysfunction, left ventricle     with previous Diastolic HF  . Diabetes mellitus   . High cholesterol   . Kidney disease   . Cataracts, bilateral   . 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 Pulmonary HTN-severe- due to diastolic dysfunction 09/29/2011    Moderate to Severe Pulm HTN.  Marland Kitchen Acute renal failure 09/29/2011  . Proximal muscle weakness-felt  related to statin-induced myositis-recurrent 09/29/2011  . HYPOTHYROIDISM 06/19/2008    Qualifier: Diagnosis of  By: Excell Seltzer CMA, Lawson Fiscal    . Renal artery stenosis     s/p stents (restenosis)  . Subclavian arterial stenosis   . Peripheral neuropathy   . Coronary artery disease   . Myocardial infarction   . Pericarditis   . Anxiety   . Depression   . Shortness of breath   . CHF (congestive heart failure)   . GERD (gastroesophageal reflux disease)   . Obstructive sleep apnea on nocturnal CPAP 06/20/2008    uses CPAP @ night  . Unstable angina   . Stroke   . Arthritis   . Vertigo   . Complication of anesthesia   . PONV (postoperative nausea and vomiting)   . Family history of anesthesia complication     MOTHER ALSO HAD NAUSEA  . Blood clot in abdominal vein 01/09/2013  . History of nuclear stress test 10/10/2011    lexiscan; no perfusion defects     Social History:  History   Social History  . Marital Status: Married    Spouse Name: N/A    Number of Children: 3  . Years of Education: N/A   Occupational History  . retired    Social History Main Topics  . Smoking status: Former Smoker -- 1.00 packs/day for 25 years    Types: Cigarettes    Quit date: 05/23/2004  . Smokeless tobacco: Never Used  . Alcohol Use: No  . Drug Use: No  . Sexual Activity: Not Currently   Other Topics Concern  . Not on file  Social History Narrative  . No narrative on file    Medications:   Current Outpatient Prescriptions on File Prior to Visit  Medication Sig Dispense Refill  . ALPRAZolam (XANAX XR) 1 MG 24 hr tablet Take 1 mg by mouth daily as needed (blood pressure or anxiety). Anxiety      . aspirin EC 81 MG tablet Take 81 mg by mouth daily.      . Biotin 5000 MCG CAPS Take 10,000 mcg by mouth daily.       . Cholecalciferol (VITAMIN D) 2000 UNITS tablet Take 2,000 Units by mouth every evening.       . cloNIDine (CATAPRES - DOSED IN MG/24 HR) 0.3 mg/24hr Place 1 patch onto the skin once  a week. Change on Tuesdays      . cloNIDine (CATAPRES) 0.1 MG tablet Take 0.1 mg by mouth daily as needed (when BP is over 170).      . clopidogrel (PLAVIX) 75 MG tablet Take 1 tablet (75 mg total) by mouth daily with breakfast.  30 tablet  11  . escitalopram (LEXAPRO) 20 MG tablet Take 20 mg by mouth daily.      . furosemide (LASIX) 40 MG tablet Take 80 mg by mouth 2 (two) times daily.       . hydrALAZINE (APRESOLINE) 50 MG tablet Take 1 tablet (50 mg total) by mouth 3 (three) times daily.  270 tablet  3  . insulin aspart (NOVOLOG) 100 UNIT/ML injection Inject 10-20 Units into the skin 3 (three) times daily with meals. Takes 10 units with breakfast, 10 units with lunch, and 20 units with supper      . insulin glargine (LANTUS) 100 UNIT/ML injection Inject 65 Units into the skin at bedtime.       . isosorbide mononitrate (IMDUR) 60 MG 24 hr tablet TAKE 1 TABLET ONCE DAILY.  30 tablet  11  . levothyroxine (SYNTHROID, LEVOTHROID) 112 MCG tablet Take 112 mcg by mouth daily.        . nebivolol (BYSTOLIC) 2.5 MG tablet Take 2.5 mg by mouth daily.      . niacin (NIASPAN) 500 MG CR tablet Take 250-500 mg by mouth 2 (two) times daily. 500mg  in the morning, 250mg  in the afternoon      . Omega-3 Fatty Acids (FISH OIL TRIPLE STRENGTH PO) Take 2 capsules by mouth 2 (two) times daily.       . pantoprazole (PROTONIX) 40 MG tablet Take 1 tablet (40 mg total) by mouth daily.  30 tablet  5  . potassium chloride SA (K-DUR,KLOR-CON) 20 MEQ tablet Take 20 mEq by mouth daily.       No current facility-administered medications on file prior to visit.    Allergies:   Allergies  Allergen Reactions  . Codeine Anaphylaxis    swelling  . Lidocaine Anaphylaxis  . Atenolol Other (See Comments)    Unknown  . Bystolic [Nebivolol Hcl]     Cannot tolerate greater then 5 mg, causes chest pain  . Ciprofloxacin Itching and Nausea And Vomiting  . Levothyroxine     Patient reports allergy to generic products of  levothyroxine, must have brand  . Metformin Nausea And Vomiting  . Metoprolol Other (See Comments)    Burns ears  . Morphine And Related Other (See Comments)    Makes patient feel like she is floating  . Penicillins Rash    Physical Exam General: obese middle aged lady, seated, in no evident distress Head: head normocephalic  and atraumatic. Orohparynx benign Neck: supple with soft carotid bruits Cardiovascular: regular rate and rhythm, no murmurs Musculoskeletal: no deformity Skin:  no rash/petichiae Vascular:  Distal LE pulses not felt Neurologic Exam Mental Status: Awake and fully alert. Oriented to place and time. MMSE 26/30 with deficits in recall and attention.  fund of knowledge appropriate. Mood and affect  But Geriatric depression scale 11 suggestive of moderate depression.  Cranial Nerves: Fundoscopic exam reveals sharp disc margins. Pupils equal, briskly reactive to light. Extraocular movements full without nystagmus. Visual fields full to confrontation. Hearing diminished on right. Facial sensation intact. Face, tongue, palate moves normally and symmetrically.  Motor: Normal bulk and tone. Normal strength in all tested extremity muscles.diminished fine finger movements on right and orbits left over right upper extremity. Sensory.: intact to touch and pinprick and  Diminished vibratory sensation bilateral toes.  Coordination: Rapid alternating movements normal in all extremities. Finger-to-nose and heel-to-shin performed accurately bilaterally. Gait and Station: Arises from chair without difficulty. Stance is normal. Gait demonstrates normal stride length and balance . Not able to heel, toe and tandem walk without difficulty.  Reflexes: 1+ and symmetric except ankle jerks are depressed. Toes downgoing.    ASSESSMENT: 71 year lady with  Mild memory difficulties likely from suboptimally treated depression. Remote h/o bilateral cerebellar strokes.     PLAN: Continue Plavix for  stroke prevention with strict control of hypertension with blood pressure goal below 120/80, diabetes with 1 A1c goal below 6.5% and lipids with LDL cholesterol goal below 70 mg percent. Check EEG and TSH. Check carotid ultrasound and trancranial Doppler studies. Increase Lexapro to 40 mg daily for depression. Return for followup in 2 months or call earlier if necessary.

## 2013-03-01 ENCOUNTER — Other Ambulatory Visit: Payer: Medicare Other

## 2013-03-05 ENCOUNTER — Encounter (INDEPENDENT_AMBULATORY_CARE_PROVIDER_SITE_OTHER): Payer: Self-pay

## 2013-03-05 ENCOUNTER — Ambulatory Visit (INDEPENDENT_AMBULATORY_CARE_PROVIDER_SITE_OTHER): Payer: Medicare Other

## 2013-03-05 ENCOUNTER — Other Ambulatory Visit (INDEPENDENT_AMBULATORY_CARE_PROVIDER_SITE_OTHER): Payer: Medicare Other | Admitting: Radiology

## 2013-03-05 DIAGNOSIS — I6789 Other cerebrovascular disease: Secondary | ICD-10-CM

## 2013-03-05 DIAGNOSIS — R55 Syncope and collapse: Secondary | ICD-10-CM

## 2013-03-05 DIAGNOSIS — Z0289 Encounter for other administrative examinations: Secondary | ICD-10-CM

## 2013-03-05 DIAGNOSIS — R413 Other amnesia: Secondary | ICD-10-CM

## 2013-03-05 DIAGNOSIS — R0989 Other specified symptoms and signs involving the circulatory and respiratory systems: Secondary | ICD-10-CM

## 2013-03-05 DIAGNOSIS — G459 Transient cerebral ischemic attack, unspecified: Secondary | ICD-10-CM

## 2013-03-05 DIAGNOSIS — G9389 Other specified disorders of brain: Secondary | ICD-10-CM

## 2013-03-06 ENCOUNTER — Telehealth: Payer: Self-pay | Admitting: Cardiovascular Disease

## 2013-03-06 NOTE — Telephone Encounter (Signed)
Message forwarded to K. Vogel, RN.  

## 2013-03-06 NOTE — Telephone Encounter (Signed)
Please call-She says she need something from Dr Allyson Sabal to say that she need a C-Pac.

## 2013-03-06 NOTE — Telephone Encounter (Signed)
lmom 

## 2013-03-11 ENCOUNTER — Ambulatory Visit (INDEPENDENT_AMBULATORY_CARE_PROVIDER_SITE_OTHER): Payer: Medicare Other | Admitting: Cardiovascular Disease

## 2013-03-11 ENCOUNTER — Encounter: Payer: Self-pay | Admitting: Cardiovascular Disease

## 2013-03-11 VITALS — BP 180/70 | HR 80 | Ht 66.0 in | Wt 192.0 lb

## 2013-03-11 DIAGNOSIS — I739 Peripheral vascular disease, unspecified: Secondary | ICD-10-CM

## 2013-03-11 DIAGNOSIS — I6529 Occlusion and stenosis of unspecified carotid artery: Secondary | ICD-10-CM

## 2013-03-11 DIAGNOSIS — E785 Hyperlipidemia, unspecified: Secondary | ICD-10-CM

## 2013-03-11 MED ORDER — NEBIVOLOL HCL 5 MG PO TABS: 5.0000 mg | ORAL_TABLET | Freq: Every day | ORAL | Status: DC

## 2013-03-11 NOTE — Assessment & Plan Note (Signed)
I angiogrammed her 01/10/13 because of claudication. She had a 70% proximal right common iliac artery stenosis which I stented with an 8 x 39 mm stent with an excellent angiographic result. Her followup Dopplers performed 01/29/13 revealed a right ABI 0.96 with a patent stent. Her symptoms improved. She does complain of some right lower extremity swelling however on exam today she only has a mild ankle edema.

## 2013-03-11 NOTE — Assessment & Plan Note (Signed)
Followed by Dr. Hulda Marin at [facility]. 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

## 2013-03-11 NOTE — Progress Notes (Signed)
03/11/2013 [name]   May 09, 1946  [id number]  Primary Physician Alice Reichert, MD Primary Cardiologist: Runell Gess MD [name]   HPI: The patient is a 67 year old, moderately overweight, married Caucasian female, mother of 3, grandmother to 6 grandchildren who I last saw in the office 6 months ago. She has a history of normal coronary arteries by cath which I performed January 28, 2011, with normal LV function and diastolic dysfunction. 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 She also has obstructive sleep apnea not on CPAP. She has been followed with renal Dopplers in our office. Dr. Hulda Marin at [facility] follows her hyperlipidemia. 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 Since that time, she has had swelling of her right upper extremity. She also has what was described as a stroke, which she is recovering from. Carotid Dopplers did not show significant internal carotid artery stenosis.  I saw her in the office in approximately 6 months ago. She was recently noted for question stroke at any time hospital. She is chronically short of breath but denies chest pain. She does have a history of normal coronary arteries by cath. Recent lotion we Dopplers revealed a high-frequency signal in the ostium of the right common iliac artery and should does complain of right lower shin" with an ABI of 0.76. My plans be to admit her the night before. Procedure and hydrate her, hold her diuretic and performed abdominal aortography and endovascular therapy of her right common iliac artery with an 8 x 39 mm stent performed 01/10/13. Her followup Dopplers performed 01/29/13 revealed a right ABI  0.96 with a patent stent pericolic tissue improved but she does complain of some dependent right ankle edema.    Current Outpatient Prescriptions  Medication Sig Dispense Refill  . ALPRAZolam (XANAX XR) 1 MG 24 hr tablet Take 1 mg by mouth daily as needed (blood pressure or anxiety). Anxiety      . aspirin EC 81 MG tablet Take 81 mg by mouth daily.      . B Complex-C (SUPER B COMPLEX PO) Take by mouth daily.      . B-D ULTRAFINE III SHORT PEN 31G X 8 MM MISC       . Biotin 5000 MCG CAPS Take 10,000 mcg by mouth daily.       . Cholecalciferol (VITAMIN D) 2000 UNITS tablet Take 2,000 Units by mouth every evening.       . cloNIDine (CATAPRES - DOSED IN MG/24 HR) 0.3 mg/24hr Place 1 patch onto the skin once a week. Change on Tuesdays      . cloNIDine (CATAPRES) 0.1 MG tablet Take 0.1 mg by mouth daily as needed (when BP is over 170).      . clopidogrel (PLAVIX) 75 MG tablet Take 1 tablet (75 mg total) by mouth daily with breakfast.  30 tablet  11  . Coenzyme Q10 (COQ-10) 200 MG CAPS Take 200 mg by mouth daily.      Marland Kitchen escitalopram (LEXAPRO) 20 MG tablet Take 20 mg by mouth daily.      . furosemide (LASIX) 40 MG tablet Take 80 mg by mouth 2 (two) times daily.       . hydrALAZINE (APRESOLINE) 50 MG tablet Take 1 tablet (50 mg total)  by mouth 3 (three) times daily.  270 tablet  3  . insulin aspart (NOVOLOG) 100 UNIT/ML injection Inject 10-20 Units into the skin 3 (three) times daily with meals. Takes 10 units with breakfast, 10 units with lunch, and 20 units with supper      . insulin glargine (LANTUS) 100 UNIT/ML injection Inject 65 Units into the skin at bedtime.       . isosorbide mononitrate (IMDUR) 60 MG 24 hr tablet TAKE 1 TABLET ONCE DAILY.  30 tablet  11  . levothyroxine (SYNTHROID, LEVOTHROID) 112 MCG tablet Take 112 mcg by mouth daily.        . nebivolol (BYSTOLIC) 5 MG tablet Take 1 tablet (5 mg total) by mouth daily.  90 tablet  3  . niacin (NIASPAN) 500 MG CR tablet Take 250-500 mg by  mouth 2 (two) times daily. 500mg  in the morning, 250mg  in the afternoon      . Omega-3 Fatty Acids (FISH OIL TRIPLE STRENGTH PO) Take 2 capsules by mouth 2 (two) times daily.       Marland Kitchen omeprazole (PRILOSEC) 40 MG capsule       . pantoprazole (PROTONIX) 40 MG tablet Take 1 tablet (40 mg total) by mouth daily.  30 tablet  5  . potassium chloride SA (K-DUR,KLOR-CON) 20 MEQ tablet Take 20 mEq by mouth daily.       No current facility-administered medications for this visit.    Allergies  Allergen Reactions  . Codeine Anaphylaxis    swelling  . Lidocaine Anaphylaxis  . Atenolol Other (See Comments)    Unknown  . Bystolic [Nebivolol Hcl]     Cannot tolerate greater then 5 mg, causes chest pain  . Ciprofloxacin Itching and Nausea And Vomiting  . Levothyroxine     Patient reports allergy to generic products of levothyroxine, must have brand  . Metformin Nausea And Vomiting  . Metoprolol Other (See Comments)    Burns ears  . Morphine And Related Other (See Comments)    Makes patient feel like she is floating  . Penicillins Rash    History   Social History  . Marital Status: Married    Spouse Name: N/A    Number of Children: 3  . Years of Education: N/A   Occupational History  . retired    Social History Main Topics  . Smoking status: Former Smoker -- 1.00 packs/day for 25 years    Types: Cigarettes    Quit date: 05/23/2004  . Smokeless tobacco: Never Used  . Alcohol Use: No  . Drug Use: No  . Sexual Activity: Not Currently   Other Topics Concern  . Not on file   Social History Narrative  . No narrative on file     Review of Systems: General: negative for chills, fever, night sweats or weight changes.  Cardiovascular: negative for chest pain, dyspnea on exertion, edema, orthopnea, palpitations, paroxysmal nocturnal dyspnea or shortness of breath Dermatological: negative for rash Respiratory: negative for cough or wheezing Urologic: negative for hematuria Abdominal:  negative for nausea, vomiting, diarrhea, bright red blood per rectum, melena, or hematemesis Neurologic: negative for visual changes, syncope, or dizziness All other systems reviewed and are otherwise negative except as noted above.    Blood pressure 180/70, pulse 80, height 5\' 6"  (1.676 m), weight 192 lb (87.091 kg).  General appearance: alert and no distress Neck: no adenopathy, no JVD, supple, symmetrical, trachea midline, thyroid not enlarged, symmetric, no tenderness/mass/nodules and soft bilateral carotid bruits  Lungs: clear to auscultation bilaterally Heart: soft outflow tract murmur Extremities: extremities normal, atraumatic, no cyanosis or edema and 2+ right pedal pulse  EKG not performed today  ASSESSMENT AND PLAN:   Peripheral arterial disease -s/p stenting of right CIA I angiogrammed her 01/10/13 because of claudication. She had a 70% proximal right common iliac artery stenosis which I stented with an 8 x 39 mm stent with an excellent angiographic result. Her followup Dopplers performed 01/29/13 revealed a right ABI 0.96 with a patent stent. Her symptoms improved. She does complain of some right lower extremity swelling however on exam today she only has a mild ankle edema.  HYPERLIPIDEMIA, hx of statin induced myosistis Followed by Dr. Hulda Marin at [facility]. 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  RAS (renal artery stenosis), s/p renal artery PTA X 2 Status post bilateral renal artery stenting with restenting using iCAST covered stent. Her most recent renal Dopplers performed 11/29/12 revealed her stents to be patent and her angiogram performed 01/10/13 again showed widely patent stents. He does have labile hypertension on multiple antihypertensive medications.      Runell Gess MD FACP,FACC,FAHA,  [facility] 03/11/2013 12:09 PM

## 2013-03-11 NOTE — Telephone Encounter (Signed)
Spoke with patient and she no longer needs anything from me.  She got it worked out.

## 2013-03-11 NOTE — Patient Instructions (Signed)
  Your physician wants you to follow-up with him in : 6 months with Dr Allyson Sabal                                            and with an extender in : 3 months with an extender                    You will receive a reminder letter in the mail one month in advance. If you don't receive a letter, please call our office to schedule the follow-up appointment.    Your physician has recommended you make the following change in your medication: increase bystolic to 5 mg daily ( I sent a new prescription into the pharmacy, but you can double up on the 2.5mg  tablets until you run out)   Your physician has ordered the following tests: carotid dopplers

## 2013-03-11 NOTE — Assessment & Plan Note (Signed)
Status post bilateral renal artery stenting with restenting using iCAST covered stent. Her most recent renal Dopplers performed 11/29/12 revealed her stents to be patent and her angiogram performed 01/10/13 again showed widely patent stents. He does have labile hypertension on multiple antihypertensive medications.

## 2013-03-14 ENCOUNTER — Telehealth (HOSPITAL_COMMUNITY): Payer: Self-pay | Admitting: *Deleted

## 2013-03-14 ENCOUNTER — Telehealth: Payer: Self-pay | Admitting: Cardiovascular Disease

## 2013-03-14 MED ORDER — AMLODIPINE BESYLATE 5 MG PO TABS: 5.0000 mg | ORAL_TABLET | Freq: Every day | ORAL | Status: DC

## 2013-03-14 NOTE — Telephone Encounter (Signed)
Returned call and informed pt per instructions by PA.  Pt verbalized understanding and agreed w/ plan.  Rx sent to pharmacy.  Pt stated she isn't sure if she can take Norvasc or not.  Pt informed it is not on her allergy list and advised she talk w/ her pharmacist in case there are other allergies not listed here.  Pt verbalized understanding and agreed w/ plan.  RN also discussed w/ Belenda Cruise, PharmD and was advised to have pt come in w/ her for BP check in the middle on next week instead of Monday.  Pt informed and agreed w/ plan.    Spoke w/ Ebony in Scheduling and appt changed from Monday to Wednesday at 10:30am for Carotid Doppler and RN scheduled BP check the same day at 11:40am w/ Belenda Cruise, PharmD.  Pt verbalized understanding and stated she wrote appts down.  Pt will call pharmacy now and call back if unable to take Norvasc.

## 2013-03-14 NOTE — Telephone Encounter (Signed)
Returned call.  Pt stated her BP has been 170s-180s/70s-90s even with taking the clonidine and she is getting concerned.  Pt stated with her h/o of heart attacks and stroke, she is concerned.  Pt asking to see provider on Monday when she comes in for her doppler.  Pt informed Drema Pry will be notified for further instructions as Dr. Allyson Sabal is out of the office today.  Pt verbalized understanding and agreed w/ plan.  Message forwarded to [facility] [place] for further instructions.

## 2013-03-14 NOTE — Telephone Encounter (Signed)
Returned call.  Pt informed Dr. Allyson Sabal has been notified.  Also informed Dr. Allyson Sabal will not be in the office until tomorrow and RN will call back when response given.  Pt verbalized understanding and agreed w/ plan.

## 2013-03-14 NOTE — Telephone Encounter (Signed)
Start Norvasc 5 mg daily. Office visit Monday  Corine Shelter PA-C 03/14/2013 1:33 PM

## 2013-03-14 NOTE — Telephone Encounter (Signed)
Was calling back to let u know talked to pharmacy and they do not have a record of medicine allergies  But [name] believes she has had a problem with it and cant take  Please call

## 2013-03-14 NOTE — Telephone Encounter (Signed)
Blood pressure is still high,can not take  Norvasc-Would you please call in something else?{lease call her.

## 2013-03-14 NOTE — Telephone Encounter (Signed)
Returned call.  Pt stated it has been a while since she took Norvasc, but when she did it caused dizziness, a feeling like sinus pressure and she wasn't able to take care of her grandkids or drive.  Pt stated she does not want to take it and wants to know if there is something else she can take.  Pt informed RN will have to discuss w/ PA or pharmacist for further instructions and call her back.  Pt verbalized understanding and agreed w/ plan.  RN decided to notify Dr. Allyson Sabal for further instructions as pt seen by him two days ago.  Pt did agree to keep appts for Carotid Doppler and BP check next Wednesday 10.29.14.  Message forwarded to Dr. Allyson Sabal for review and further instructions

## 2013-03-14 NOTE — Telephone Encounter (Signed)
Pt has a carotid doppler test on Monday but she is concerned because for the past few days her bp has been 206/85 or more. Please call and advise on how she can adjust her bp medications or if she needs to come in and see the doctor.

## 2013-03-18 ENCOUNTER — Encounter (HOSPITAL_COMMUNITY): Payer: Medicare Other

## 2013-03-18 ENCOUNTER — Encounter: Payer: Self-pay | Admitting: Gastroenterology

## 2013-03-19 NOTE — Telephone Encounter (Signed)
Returned patient's call. Patient states that norvasc 5mg  was called into pharmacy and she cannot take (c/o not being able to drive, room spinning). Reports that BP in AM is >200 systolic and decreases to 150s systolic throughout the day. Also reports that Dr. Allyson Sabal put her on blood thinner (on asa and plavix) and had nosebleed this AM. Informed patient that Dr. Allyson Sabal, nor Hinda Glatter, PA (who prescribed norvasc) are not in office today but that Belenda Cruise, PharmD would be notified, as patient has BP check with her tomorrow 10/29 AM. Informed patient RN would call back with recommendations asap, if not this afternoon, then would be discussed at BP check visit tomorrow. Patient agreed with plan & verbalized understanding.

## 2013-03-19 NOTE — Telephone Encounter (Signed)
Spoke with pt - has appt Thursday am for BP check.  Stressed to bring ALL meds and her BP cuff to appt.  Advised that she continue with clonidine 0.1mg  tab qam until Thursday - we can decide about what meds are safe to try or adjust at that time.    Pt voiced understanding.

## 2013-03-19 NOTE — Telephone Encounter (Signed)
Patient states that we have called Norvasc 5 mg to her pharmacy for her blood pressure and she tried this before and she cannot take it.

## 2013-03-20 ENCOUNTER — Other Ambulatory Visit (HOSPITAL_COMMUNITY): Payer: Self-pay | Admitting: Cardiovascular Disease

## 2013-03-20 ENCOUNTER — Telehealth: Payer: Self-pay | Admitting: Cardiovascular Disease

## 2013-03-20 ENCOUNTER — Encounter: Payer: Medicare Other | Admitting: Pharmacist Clinician (PhC)/ Clinical Pharmacy Specialist

## 2013-03-20 ENCOUNTER — Ambulatory Visit (HOSPITAL_COMMUNITY)
Admission: RE | Admit: 2013-03-20 | Discharge: 2013-03-20 | Disposition: A | Discharge status: Home / Self Care | Payer: Medicare Other | Source: Ambulatory Visit | Attending: Cardiovascular Disease | Admitting: Cardiovascular Disease

## 2013-03-20 DIAGNOSIS — I6529 Occlusion and stenosis of unspecified carotid artery: Secondary | ICD-10-CM

## 2013-03-20 NOTE — Progress Notes (Signed)
Carotid Duplex Completed. Davy Westmoreland, BS, RDMS, RVT  

## 2013-03-20 NOTE — Telephone Encounter (Signed)
Need something else called in for her blood pressure.She can not take Norvasc. Her BP is 210/70.

## 2013-03-20 NOTE — Telephone Encounter (Signed)
Returned call and pt verified x 2 w/ sister, Olegario Messier.  Informed message received and pt will need to be seen so that proper medication can be rx'd.  Informed we cannot make adjustments w/ her r/t allergies and intolerances to meds and pharmacist will sit w/ her to discuss everything and prescribe best medication.   Informed pt was supposed to be seen today and appt has been rescheduled for tomorrow.  Verbalized understanding and agreed w/ plan.  Pt will keep appt tomorrow.

## 2013-03-21 ENCOUNTER — Ambulatory Visit (INDEPENDENT_AMBULATORY_CARE_PROVIDER_SITE_OTHER): Payer: Medicare Other | Admitting: Pharmacist Clinician (PhC)/ Clinical Pharmacy Specialist

## 2013-03-21 ENCOUNTER — Encounter: Payer: Self-pay | Admitting: Pharmacist Clinician (PhC)/ Clinical Pharmacy Specialist

## 2013-03-21 VITALS — BP 196/74 | HR 56

## 2013-03-21 DIAGNOSIS — I10 Essential (primary) hypertension: Secondary | ICD-10-CM

## 2013-03-21 NOTE — Progress Notes (Signed)
03/21/2013 Narmeen Irineo Axon 1946/04/13 [id number]   HPI:  [name] is a 67 y.o. female patient of Dr. Allyson Sabal with a PMH below who presents today for a blood pressure check.  There has been some confusion about her medications, their doses and her intolerances to meds in the past week.  She comes in today with all of her medications as well as her home BP monitor, a newer Omron cuff that was purchased in the past 60 days.  She takes her BP multiple times per day, and worries whenever greater than 170 systolic.  She states she has had 3 strokes in the past, as well as 2 MIs and is afraid her hypertension will cause her to have another MI.  She has a strong family history of hypertension, including both parents, sister and 2 daughters.  She seems to have multiple drug intolerances, but doesn't always remember what they are.  Last week she was started on amlodipine 5mg , however after purchasing the medication stated that she had taken this once prior and was unable to tolerate due to SOB, dizziness and other issues.  She currently takes Bystolic 5mg  daily, but states that any doses higher than that will lead to chest pain.  Per allergy section of chart she appears to be hypersensitive to most beta blockers.  Currently she takes catapress tts-3 patch weekly, (confirmed that pt understood difference between medication patch and cover patch), hydralazine 50mg  tid and an extra 0.1mg  clonidine tablet daily if BP goes >170 systolic, which is most days.  She cooks most of her meals at home, does not cook with added salt, does not drink alcohol or caffeine, uses no tobacco products (quit smoking 8 years ago), and has no regular exercise.   Current Outpatient Prescriptions  Medication Sig Dispense Refill  . ALPRAZolam (XANAX XR) 1 MG 24 hr tablet Take 1 mg by mouth daily as needed (blood pressure or anxiety). Anxiety      . aspirin EC 81 MG tablet Take 81 mg by mouth daily.      . B Complex-C (SUPER B  COMPLEX PO) Take by mouth daily.      . B-D ULTRAFINE III SHORT PEN 31G X 8 MM MISC       . Biotin 5000 MCG CAPS Take 10,000 mcg by mouth daily.       . Cholecalciferol (VITAMIN D) 2000 UNITS tablet Take 2,000 Units by mouth every evening.       . cloNIDine (CATAPRES - DOSED IN MG/24 HR) 0.3 mg/24hr Place 1 patch onto the skin once a week. Change on Tuesdays      . cloNIDine (CATAPRES) 0.1 MG tablet Take 0.1 mg by mouth daily as needed (when BP is over 170).      . clopidogrel (PLAVIX) 75 MG tablet Take 1 tablet (75 mg total) by mouth daily with breakfast.  30 tablet  11  . Coenzyme Q10 (COQ-10) 200 MG CAPS Take 200 mg by mouth daily.      Marland Kitchen escitalopram (LEXAPRO) 20 MG tablet Take 20 mg by mouth daily.      . furosemide (LASIX) 40 MG tablet Take 80 mg by mouth 2 (two) times daily.       . hydrALAZINE (APRESOLINE) 50 MG tablet Take 1 tablet (50 mg total) by mouth 3 (three) times daily.  270 tablet  3  . insulin aspart (NOVOLOG) 100 UNIT/ML injection Inject 10-20 Units into the skin 3 (three) times daily  with meals. Takes 10 units with breakfast, 10 units with lunch, and 20 units with supper      . insulin glargine (LANTUS) 100 UNIT/ML injection Inject 65 Units into the skin at bedtime.       . isosorbide mononitrate (IMDUR) 60 MG 24 hr tablet TAKE 1 TABLET ONCE DAILY.  30 tablet  11  . levothyroxine (SYNTHROID, LEVOTHROID) 112 MCG tablet Take 112 mcg by mouth daily.        . nebivolol (BYSTOLIC) 5 MG tablet Take 1 tablet (5 mg total) by mouth daily.  90 tablet  3  . niacin (NIASPAN) 500 MG CR tablet Take 250-500 mg by mouth 2 (two) times daily. 500mg  in the morning, 250mg  in the afternoon      . Omega-3 Fatty Acids (FISH OIL TRIPLE STRENGTH PO) Take 2 capsules by mouth 2 (two) times daily.       Marland Kitchen omeprazole (PRILOSEC) 40 MG capsule       . potassium chloride SA (K-DUR,KLOR-CON) 20 MEQ tablet Take 20 mEq by mouth daily.       No current facility-administered medications for this visit.     Allergies  Allergen Reactions  . Codeine Anaphylaxis    swelling  . Lidocaine Anaphylaxis  . Amlodipine   . Atenolol Other (See Comments)    Unknown  . Bystolic [Nebivolol Hcl]     Cannot tolerate greater then 5 mg, causes chest pain  . Ciprofloxacin Itching and Nausea And Vomiting  . Levothyroxine     Patient reports allergy to generic products of levothyroxine, must have brand  . Metformin Nausea And Vomiting  . Metoprolol Other (See Comments)    Burns ears  . Morphine And Related Other (See Comments)    Makes patient feel like she is floating  . Penicillins Rash    Past Medical History  Diagnosis Date  . HTN (hypertension)   . Elevated troponin level 09/28/11    Type II MI - not ACS related  . Diastolic dysfunction, left ventricle     with previous Diastolic HF  . Diabetes mellitus   . High cholesterol   . Kidney disease   . Cataracts, bilateral   . 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 Pulmonary HTN-severe- due to diastolic dysfunction 09/29/2011    Moderate to Severe Pulm HTN.  Marland Kitchen Acute renal failure 09/29/2011  . 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 HYPOTHYROIDISM 06/19/2008    Qualifier: Diagnosis of  By: Excell Seltzer CMA, Lawson Fiscal    . Renal artery stenosis     s/p stents (restenosis)  . Subclavian arterial stenosis   . Peripheral neuropathy   . Coronary artery disease   . Myocardial infarction   . Pericarditis   . Anxiety   . Depression   . Shortness of breath   . CHF (congestive heart failure)   . GERD (gastroesophageal reflux disease)   . Obstructive sleep apnea on nocturnal CPAP 06/20/2008    AHI-1.0/hr, AHI REM-7.5/hr  . Unstable angina   . Stroke   . Arthritis   . Vertigo   . Complication of anesthesia   . PONV (postoperative nausea and vomiting)   . Family history of anesthesia complication     MOTHER ALSO HAD NAUSEA  . Blood clot in abdominal vein 01/09/2013  . History  of nuclear stress test 10/10/2011    lexiscan; no perfusion defects     Blood pressure 196/74, pulse 56.  Standing 182/66  ASSESSMENT AND PLAN: By [facility] her goal should be <150/90, however she is at this point IDDM, so the goal from ADA is 140/80.   There is also concern about her inability to tolerate medications, I don't know which meds she became intolerant to after 1 dose and which she took for several weeks before stopping.  There is indication that she took valsartan over 1 year ago, it was apparently d/c during hospital stay.  No indication as to any hypersensitivites to ACEI or ARB medications.  At this time I will increase her hydralazine to 100mg  tid, she states compliance with tid dosing.  I have asked her to check her BP only ONCE per day and given her written instructions on proper BP technique.  Her Omron BP cuff was measured in office today and found to be accurate.  I have asked her to return in 3 weeks for a follow up BP check.  At that time I will probably start her on an ACEI or ARB, which she should be on for renal protection.  I hope to wean her from the 0.1mg  clonidine tablets.    Phillips Hay PharmD CPP [facility]

## 2013-03-21 NOTE — Patient Instructions (Addendum)
Return visit in 3 weeks  Your blood pressure today is high at 196/74. Check your blood pressure at home daily (if able) and keep record of the readings.  Take your BP meds as follows:  Hydralazine 100mg  three times daily (2 x 50mg  tablets)  Clonidine patch - change every Saturday  Bystolic 5mg  - once daily each morning  Bring all of your meds, your BP cuff and your record of home blood pressures to your next appointment.  Exercise as you're able, try to walk approximately 30 minutes per day.  Keep salt intake to a minimum, especially watch canned and prepared boxed foods.  Eat more fresh fruits and vegetables and fewer canned items.  Avoid eating in fast food restaurants.   HOW TO TAKE YOUR BLOOD PRESSURE:   Rest 5 minutes before taking your blood pressure.    Don't smoke or drink caffeinated beverages for at least 30 minutes before.   Take your blood pressure before (not after) you eat.   Sit comfortably with your back supported and both feet on the floor (don't cross your legs).   Elevate your arm to heart level on a table or a desk.   Use the proper sized cuff. It should fit smoothly and snugly around your bare upper arm. There should be enough room to slip a fingertip under the cuff. The bottom edge of the cuff should be 1 inch above the crease of the elbow.   Ideally, take 3 measurements at one sitting and record the average.

## 2013-03-22 ENCOUNTER — Telehealth: Payer: Self-pay | Admitting: Cardiovascular Disease

## 2013-03-22 NOTE — Telephone Encounter (Signed)
Spoke to patient. She states her nose bleed when she blew her nose this morning. It is not bleeding at the present time.  RN asked patient if she turned her heat on at her residence. Patient stated yes. RN discussed with Vernona Rieger NP.  Per Vernona Rieger NP- May use Afrin nose spray,use humidifer at home when using heat;hold pressure if bleeding continue for several minutes if bleeding is continuous go to ER. Patient may  Hold plavix for one day only but continue aspirin.   Above instructions were given to patient and patient verbalized understanding.

## 2013-03-22 NOTE — Telephone Encounter (Signed)
Please call-having nosebleeds-wants to know if she should continue taking her Plavix?

## 2013-03-26 ENCOUNTER — Encounter: Payer: Self-pay | Admitting: *Deleted

## 2013-04-01 ENCOUNTER — Telehealth: Payer: Self-pay | Admitting: Cardiovascular Disease

## 2013-04-01 MED ORDER — IRBESARTAN 150 MG PO TABS: 150.0000 mg | ORAL_TABLET | Freq: Every evening | ORAL | Status: DC

## 2013-04-01 NOTE — Telephone Encounter (Signed)
BP still elevated, states compliant with meds.  Worried about having a stroke.  Will start irbesartan 150mg  qhs and moved her appointment from Nov 26 to Nov 20.  Rx sent to [facility].  Will have pt get blood work at Nov 20 appt.

## 2013-04-01 NOTE — Telephone Encounter (Signed)
Returned call and pt verified x 2.  Pt stated BP has been 195-200/70s-80s.  Stated she is concerned about her BP being that high.  Pt stated she is taking hydralazine 100 mg TID, Clonidine patch (change every Sat), Bystolic and prn clonidine for BP>170.  Pt informed Belenda Cruise, PharmD will be notified for further instructions.  Pt verbalized understanding and agreed w/ plan.  Message forwarded to Belenda Cruise, PharmD.

## 2013-04-01 NOTE — Telephone Encounter (Signed)
Please call-concerning her blood pressure medicine.

## 2013-04-03 ENCOUNTER — Encounter: Payer: Self-pay | Admitting: Gastroenterology

## 2013-04-03 ENCOUNTER — Ambulatory Visit (INDEPENDENT_AMBULATORY_CARE_PROVIDER_SITE_OTHER): Payer: Medicare Other | Admitting: Gastroenterology

## 2013-04-03 VITALS — BP 162/70 | HR 64 | Ht 66.0 in | Wt 190.8 lb

## 2013-04-03 DIAGNOSIS — Z860100 Personal history of colon polyps, unspecified: Secondary | ICD-10-CM | POA: Insufficient documentation

## 2013-04-03 DIAGNOSIS — D126 Benign neoplasm of colon, unspecified: Secondary | ICD-10-CM

## 2013-04-03 DIAGNOSIS — R197 Diarrhea, unspecified: Secondary | ICD-10-CM

## 2013-04-03 NOTE — Assessment & Plan Note (Signed)
3-4 month history of severe diarrhea With incontinence.  Symptoms developed after starting omeprazole raising the question of  drug-induced diarrhea.  Patient is diabetic and could have bacterial overgrowth.  Recommendations #1 hold omeprazole for 5-7 days then reevaluate; if not improved I would consider empiric therapy for bacterial overgrowth

## 2013-04-03 NOTE — Progress Notes (Signed)
History of Present Illness:  67 year old white female with multiple medical problems including diabetes, history of strep endocarditis, pulmonary hypertension, hypothyroidism, coronary artery disease, congestive heart failure, obstructive sleep apnea, history of CVA, colonic polyposis referred for evaluation of diarrhea and incontinence.  Over the past 3-4 months she's developed a diarrhea consisting of postprandial diarrhea at least 3-4 times a day and sometimes awakening her.  Stools are watery and accompanied by severe urgency.  She occasionally has incontinence.  In addition, she may pass small amounts of stool when she has to pass gas.  Prior to this she was having solid stools.  Omeprazole started about 5 months ago.  There have been no other medication changes preceding diarrhea.  She has not taken antibiotics.  In may, 2013 multiple sessile colon polyps were removed.  She developed recurrent post polypectomy bleeding requiring therapeutic intervention x3.  She has had no recurrences of lower GI bleeding.  She is on Plavix after placement of a vascular stent in August, 2014.    Review of Systems: She complains of lower extremity weakness.  She has nasal congestion Pertinent positive and negative review of systems were noted in the above HPI section. All other review of systems were otherwise negative.    Current Medications, Allergies, Past Medical History, Past Surgical History, Family History and Social History were reviewed in [employer] electronic medical record  Vital signs were reviewed in today's medical record. Physical Exam: General: Well developed , well nourished, no acute distress Skin: anicteric Head: Normocephalic and atraumatic Eyes:  sclerae anicteric, EOMI Ears: Normal auditory acuity Mouth: No deformity or lesions Lungs: Clear throughout to auscultation Heart: Regular rate and rhythm; no  rubs or bruits.  There is a 2/6 early systolic murmur heard loudest at the left  sternal border Abdomen: Soft, non tender and non distended. No masses, hepatosplenomegaly or hernias noted. Normal Bowel sounds.  Rectal:deferred Musculoskeletal: Symmetrical with no gross deformities  Pulses:  Normal pulses noted Extremities: No clubbing, cyanosis, edema or deformities noted Neurological: Alert oriented x 4, grossly nonfocal Psychological:  Alert and cooperative. Normal mood and affect

## 2013-04-03 NOTE — Patient Instructions (Signed)
Hold prilosec  Call back in one week to report progress We will need to schedule you for a colonoscopy Once we see if its premitted that you can come off plavix  We will contact you to schedule

## 2013-04-03 NOTE — Assessment & Plan Note (Signed)
Multiple large adenomatous polyps were removed in 2013.  Colonoscopy complicated by post-polypectomy bleeding.  Recommendations #1 patient is due for followup colonoscopy.  This will be deferred until diarrhea has resolved.  Patient is on Plavix.  I will contact her cardiologist to determine when Plavix can be held.

## 2013-04-04 NOTE — Progress Notes (Signed)
Rob, I think it's safe to stop her anti platelet therapy now if you want to perform your procedure and re start afterwards. . I stented her iliac 3 months ago so I think we should be OK.  JJB

## 2013-04-08 ENCOUNTER — Telehealth: Payer: Self-pay | Admitting: Cardiovascular Disease

## 2013-04-08 NOTE — Telephone Encounter (Signed)
Wanted to let us know her BP is running from 170 1o 177 and bottom 77   Now taking hydralazine 50 mg  Take 2 pills 3 x per day   Takes 1 at night irbesartan 150 mg  Please call

## 2013-04-08 NOTE — Telephone Encounter (Signed)
Pt will call back if no change in BP or problems arise.

## 2013-04-08 NOTE — Telephone Encounter (Signed)
Belenda Cruise, pharmacist, notified.  Advised pt increase irbesartan to 300 mg (two tabs qhs) and come in Thursday for BP check.    Returned call and pt verified x 2.  Pt informed message received and [facility] notified.  Pt informed per Belenda Cruise.  Pt previously canceled appt on 11.20.14 for BP check and appt rescheduled for 11.20.14 at 3pm w/ Belenda Cruise.  Pt also requested to schedule an appt w/ Dr. Allyson Sabal.  Pt informed she saw Dr. Allyson Sabal on 10.20.14 and he advised she return in 3 mos w/ Extender and 6 mos w/ him.  Pt still requested to see Dr. Allyson Sabal and scheduled next available appt on 12.5.14 at 11:15am.  Pt verbalized understanding of all instructions.  Pt also repeated instructions and appt date/times.

## 2013-04-11 ENCOUNTER — Encounter: Payer: Medicare Other | Admitting: Pharmacist Clinician (PhC)/ Clinical Pharmacy Specialist

## 2013-04-11 ENCOUNTER — Ambulatory Visit (INDEPENDENT_AMBULATORY_CARE_PROVIDER_SITE_OTHER): Payer: Medicare Other | Admitting: Pharmacist Clinician (PhC)/ Clinical Pharmacy Specialist

## 2013-04-11 ENCOUNTER — Encounter: Payer: Self-pay | Admitting: Pharmacist Clinician (PhC)/ Clinical Pharmacy Specialist

## 2013-04-11 VITALS — BP 140/58 | HR 68

## 2013-04-11 DIAGNOSIS — I10 Essential (primary) hypertension: Secondary | ICD-10-CM

## 2013-04-11 MED ORDER — IRBESARTAN 300 MG PO TABS: 300.0000 mg | ORAL_TABLET | Freq: Every day | ORAL | Status: DC

## 2013-04-11 NOTE — Progress Notes (Signed)
04/11/2013 [name] 07/22/1945 [id number]   HPI:  [name] is a 67 y.o. female patient of Dr. Allyson Sabal with a PMH below who presents today for a blood pressure check.  After her last visit about 1 month ago she stated that her pressures were unchanged at home.  In addition to the Catapres patch and hydralazine 100mg  tid, we added irbesartan 150mg .  After another week of pressures still in the 170s systolic she was to increase to 300mg  each night and return for a BP check.  She states compliance with meds today and states that she has been trying to take her BP fewer times per day.  Previously she was checking her BP 4-6 times per day, and as the number would go up she would stress more and repeat the BP checks more frequently.  She did say that she has not taken any clonidine tablets in the past few weeks.  Otherwise she states she has been feeling well and has no other complaints today.   Current Outpatient Prescriptions  Medication Sig Dispense Refill  . ALPRAZolam (XANAX XR) 1 MG 24 hr tablet Take 1 mg by mouth daily as needed (blood pressure or anxiety). Anxiety      . amLODipine (NORVASC) 5 MG tablet       . aspirin EC 81 MG tablet Take 81 mg by mouth daily.      . B Complex-C (SUPER B COMPLEX PO) Take by mouth daily.      . B-D ULTRAFINE III SHORT PEN 31G X 8 MM MISC       . Biotin 5000 MCG CAPS Take 10,000 mcg by mouth daily.       . Cholecalciferol (VITAMIN D) 2000 UNITS tablet Take 2,000 Units by mouth every evening.       . cloNIDine (CATAPRES - DOSED IN MG/24 HR) 0.3 mg/24hr Place 1 patch onto the skin once a week. Change on Tuesdays      . cloNIDine (CATAPRES) 0.1 MG tablet Take 0.1 mg by mouth daily as needed (when BP is over 170).      . clopidogrel (PLAVIX) 75 MG tablet Take 1 tablet (75 mg total) by mouth daily with breakfast.  30 tablet  11  . Coenzyme Q10 (COQ-10) 200 MG CAPS Take 200 mg by mouth daily.      Marland Kitchen escitalopram (LEXAPRO) 20 MG tablet Take  20 mg by mouth daily.      . furosemide (LASIX) 40 MG tablet Take 80 mg by mouth 2 (two) times daily.       . hydrALAZINE (APRESOLINE) 50 MG tablet Take 1 tablet (50 mg total) by mouth 3 (three) times daily.  270 tablet  3  . insulin aspart (NOVOLOG) 100 UNIT/ML injection Inject 10-20 Units into the skin 3 (three) times daily with meals. Takes 10 units with breakfast, 10 units with lunch, and 20 units with supper      . insulin glargine (LANTUS) 100 UNIT/ML injection Inject 65 Units into the skin at bedtime.       . irbesartan (AVAPRO) 150 MG tablet Take 1 tablet (150 mg total) by mouth every evening.  30 tablet  1  . isosorbide mononitrate (IMDUR) 60 MG 24 hr tablet TAKE 1 TABLET ONCE DAILY.  30 tablet  11  . levothyroxine (SYNTHROID, LEVOTHROID) 112 MCG tablet Take 112 mcg by mouth daily.        . nebivolol (BYSTOLIC) 5 MG  tablet Take 1 tablet (5 mg total) by mouth daily.  90 tablet  3  . niacin (NIASPAN) 500 MG CR tablet Take 250-500 mg by mouth 2 (two) times daily. 500mg  in the morning, 250mg  in the afternoon      . Omega-3 Fatty Acids (FISH OIL TRIPLE STRENGTH PO) Take 2 capsules by mouth 2 (two) times daily.       Marland Kitchen omeprazole (PRILOSEC) 40 MG capsule       . potassium chloride SA (K-DUR,KLOR-CON) 20 MEQ tablet Take 20 mEq by mouth daily.       No current facility-administered medications for this visit.    Allergies  Allergen Reactions  . Codeine Anaphylaxis    swelling  . Lidocaine Anaphylaxis  . Amlodipine   . Atenolol Other (See Comments)    Unknown  . Bystolic [Nebivolol Hcl]     Cannot tolerate greater then 5 mg, causes chest pain  . Ciprofloxacin Itching and Nausea And Vomiting  . Levothyroxine     Patient reports allergy to generic products of levothyroxine, must have brand  . Metformin Nausea And Vomiting  . Metoprolol Other (See Comments)    Burns ears  . Morphine And Related Other (See Comments)    Makes patient feel like she is floating  . Penicillins Rash     Past Medical History  Diagnosis Date  . HTN (hypertension)   . Elevated troponin level 09/28/11    Type II MI - not ACS related  . Diastolic dysfunction, left ventricle     with previous Diastolic HF  . Diabetes mellitus   . High cholesterol   . Kidney disease   . Cataracts, bilateral   . 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 Pulmonary HTN-severe- due to diastolic dysfunction 09/29/2011    Moderate to Severe Pulm HTN.  Marland Kitchen Acute renal failure 09/29/2011  . 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 HYPOTHYROIDISM 06/19/2008    Qualifier: Diagnosis of  By: Excell Seltzer CMA, Lawson Fiscal    . Renal artery stenosis     s/p stents (restenosis)  . Subclavian arterial stenosis   . Peripheral neuropathy   . Coronary artery disease   . Myocardial infarction   . Pericarditis   . Anxiety   . Depression   . Shortness of breath   . CHF (congestive heart failure)   . GERD (gastroesophageal reflux disease)   . Obstructive sleep apnea on nocturnal CPAP 06/20/2008    AHI-1.0/hr, AHI REM-7.5/hr  . Unstable angina   . Stroke   . Arthritis   . Vertigo   . Complication of anesthesia   . PONV (postoperative nausea and vomiting)   . Family history of anesthesia complication     MOTHER ALSO HAD NAUSEA  . Blood clot in abdominal vein 01/09/2013  . History of nuclear stress test 10/10/2011    lexiscan; no perfusion defects     Blood pressure 140/58, pulse 68.  Standing 132/56   ASSESSMENT AND PLAN: By [facility] her goal should be <150/90, however she is at this point IDDM, so the goal from ADA is 140/80.  Her BP looks good today and I suspect that her home BP cuff may not be completely accurate.  She states that she brought it in once in the past and was told it was accurate, but I have asked that she bring it to her next appointment with Dr. Allyson Sabal in December.  I have stressed again that she need to  only take her pressure once daily and she  says she'll try her best.  She understands that the repeated readings are part of the reason her numbers go up over the course of a day.  She is to continue with the irbesartan 300mg , hydralazine 100mg  tid and clonidine patches.  She asked about the potential to stop the patches, but with her sensitivities to other meds, I don't think there are many other options.  She isn't having any side effects from the patches (the clonidine tablets tend to make her drowsy), so I asked that she continue them for the time being.   She also voiced concern about the diastolic BP readings going into the 50s, afraid it might be too low.  I assured her that the readings look good and that there is nothing to worry about.       Phillips Hay PharmD CPP [facility]

## 2013-04-11 NOTE — Patient Instructions (Signed)
Your blood pressure today is good at 140/58. Check your blood pressure at home NO MORE THAN ONCE DAILY (if able) and keep record of the readings.  We will call in a prescription for the irbesartan 300mg  tablets  Bring all of your meds, your BP cuff and your record of home blood pressures to your next appointment.  Exercise as you're able, try to walk approximately 30 minutes per day.  Keep salt intake to a minimum, especially watch canned and prepared boxed foods.  Eat more fresh fruits and vegetables and fewer canned items.  Avoid eating in fast food restaurants.    HOW TO TAKE YOUR BLOOD PRESSURE:   Rest 5 minutes before taking your blood pressure.    Don't smoke or drink caffeinated beverages for at least 30 minutes before.   Take your blood pressure before (not after) you eat.   Sit comfortably with your back supported and both feet on the floor (don't cross your legs).   Elevate your arm to heart level on a table or a desk.   Use the proper sized cuff. It should fit smoothly and snugly around your bare upper arm. There should be enough room to slip a fingertip under the cuff. The bottom edge of the cuff should be 1 inch above the crease of the elbow.   Ideally, take 3 measurements at one sitting and record the average.

## 2013-04-17 ENCOUNTER — Encounter: Payer: Medicare Other | Admitting: Pharmacist Clinician (PhC)/ Clinical Pharmacy Specialist

## 2013-04-22 ENCOUNTER — Other Ambulatory Visit: Payer: Self-pay

## 2013-04-22 ENCOUNTER — Telehealth: Payer: Self-pay | Admitting: Gastroenterology

## 2013-04-22 DIAGNOSIS — R197 Diarrhea, unspecified: Secondary | ICD-10-CM

## 2013-04-22 NOTE — Telephone Encounter (Signed)
Okay to resume omeprazole if she has heartburn. Check stool pathogen panel; if negative I will place her on xifaxan.  Finally, if no improvement with the above I will proceed with colonoscopy.

## 2013-04-22 NOTE — Telephone Encounter (Signed)
Pt aware and pt will come in to the lab.

## 2013-04-22 NOTE — Telephone Encounter (Signed)
Pt was last seen 04/03/13 for diarrhea. Pt states this has not gotten any better and that now that about 30 min after she eats anything it runs right through her. Pt stopped the omeprazole and that did not help. Please advise.

## 2013-04-23 ENCOUNTER — Telehealth: Payer: Self-pay | Admitting: Gastroenterology

## 2013-04-24 NOTE — Telephone Encounter (Signed)
Mailed cards out today, tried to call patient no answer

## 2013-04-25 ENCOUNTER — Telehealth: Payer: Self-pay | Admitting: *Deleted

## 2013-04-25 NOTE — Telephone Encounter (Signed)
Faxed signed CPAP supply order back. 

## 2013-04-26 ENCOUNTER — Encounter: Payer: Self-pay | Admitting: Cardiovascular Disease

## 2013-04-26 ENCOUNTER — Ambulatory Visit (INDEPENDENT_AMBULATORY_CARE_PROVIDER_SITE_OTHER): Payer: Medicare Other | Admitting: Cardiovascular Disease

## 2013-04-26 VITALS — BP 142/58 | HR 62 | Ht 66.0 in | Wt 191.0 lb

## 2013-04-26 DIAGNOSIS — I701 Atherosclerosis of renal artery: Secondary | ICD-10-CM

## 2013-04-26 DIAGNOSIS — E785 Hyperlipidemia, unspecified: Secondary | ICD-10-CM

## 2013-04-26 DIAGNOSIS — I10 Essential (primary) hypertension: Secondary | ICD-10-CM

## 2013-04-26 NOTE — Assessment & Plan Note (Signed)
Under better control on current medications 

## 2013-04-26 NOTE — Assessment & Plan Note (Signed)
Intolerant to statin drugs. Dr. Hulda Marin  at [facility] follows this

## 2013-04-26 NOTE — Assessment & Plan Note (Signed)
Bilateral renal artery stenting with restenting using iCast  covered stents. With follow these  by duplex ultrasound. These have remained stable patent.

## 2013-04-26 NOTE — Patient Instructions (Signed)
Your physician wants you to follow-up in 6 month with an extender and 12 months Dr Allyson Sabal.  You will receive a reminder letter in the mail two months in advance. If you don't receive a letter, please call our office to schedule the follow-up appointment.  CONTINUE WITH CURRENT MEDICATION.

## 2013-04-26 NOTE — Progress Notes (Signed)
04/26/2013 [name]   Apr 26, 1946  [id number]  Primary Physician Alice Reichert, MD Primary Cardiologist: Runell Gess MD [name]   HPI:  The patient is a 67 year old, moderately overweight, married Caucasian female, mother of 3, grandmother to 6 grandchildren who I last saw in the office 6 months ago. She has a history of normal coronary arteries by cath which I performed January 28, 2011, with normal LV function and diastolic dysfunction. 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 She also has obstructive sleep apnea not on CPAP. She has been followed with renal Dopplers in our office. Dr. Hulda Marin at [facility] follows her hyperlipidemia. 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 Since that time, she has had swelling of her right upper extremity. She also has what was described as a stroke, which she is recovering from. Carotid Dopplers did not show significant internal carotid artery stenosis.  I saw her in the office in approximately 6 months ago. She was recently noted for question stroke at any time hospital. She is chronically short of breath but denies chest pain. She does have a history of normal coronary arteries by cath. Recent lotion we Dopplers revealed a high-frequency signal in the ostium of the right common iliac artery and should does complain of right lower shin" with an ABI of 0.76. My plans be to admit her the night before. Procedure and hydrate her, hold her diuretic and performed abdominal aortography and endovascular therapy of her right common iliac artery with an 8 x 39 mm stent performed 01/10/13. Her followup Dopplers performed 01/29/13 revealed a right ABI  0.96 with a patent stent pericolic tissue improved but she does complain of some dependent right ankle edema. I saw HER-2 months ago. Since that time she's remains stable patient denies chest pain or shortness of breath.    Current Outpatient Prescriptions  Medication Sig Dispense Refill  . ALPRAZolam (XANAX XR) 1 MG 24 hr tablet Take 1 mg by mouth daily as needed (blood pressure or anxiety). Anxiety      . aspirin EC 81 MG tablet Take 81 mg by mouth daily.      . B Complex-C (SUPER B COMPLEX PO) Take by mouth daily.      . B-D ULTRAFINE III SHORT PEN 31G X 8 MM MISC       . Biotin 5000 MCG CAPS Take 10,000 mcg by mouth daily.       . Cholecalciferol (VITAMIN D) 2000 UNITS tablet Take 2,000 Units by mouth every evening.       . cloNIDine (CATAPRES - DOSED IN MG/24 HR) 0.3 mg/24hr Place 1 patch onto the skin once a week. Change on Tuesdays      . cloNIDine (CATAPRES) 0.1 MG tablet Take 0.1 mg by mouth daily as needed (when BP is over 170).      . clopidogrel (PLAVIX) 75 MG tablet Take 1 tablet (75 mg total) by mouth daily with breakfast.  30 tablet  11  . Coenzyme Q10 (COQ-10) 200 MG CAPS Take 200 mg by mouth daily.      Marland Kitchen escitalopram (LEXAPRO) 20 MG tablet Take 20 mg by mouth daily.      . furosemide (LASIX) 40 MG tablet Take 80 mg by mouth 2 (two)  times daily.       . hydrALAZINE (APRESOLINE) 50 MG tablet Take 100 mg by mouth 3 (three) times daily.      . insulin aspart (NOVOLOG) 100 UNIT/ML injection Inject 10-20 Units into the skin 3 (three) times daily with meals. Takes 10 units with breakfast, 10 units with lunch, and 20 units with supper      . insulin glargine (LANTUS) 100 UNIT/ML injection Inject 65 Units into the skin at bedtime.       . irbesartan (AVAPRO) 300 MG tablet Take 1 tablet (300 mg total) by mouth daily.  30 tablet  5  . isosorbide mononitrate (IMDUR) 60 MG 24 hr tablet TAKE 1 TABLET ONCE DAILY.  30 tablet  11  . levothyroxine (SYNTHROID, LEVOTHROID) 112 MCG tablet Take  112 mcg by mouth daily.        . nebivolol (BYSTOLIC) 5 MG tablet Take 1 tablet (5 mg total) by mouth daily.  90 tablet  3  . niacin (NIASPAN) 500 MG CR tablet Take 250-500 mg by mouth 2 (two) times daily. 500mg  in the morning, 250mg  in the afternoon      . Omega-3 Fatty Acids (FISH OIL TRIPLE STRENGTH PO) Take 2 capsules by mouth 2 (two) times daily.       Marland Kitchen omeprazole (PRILOSEC) 40 MG capsule       . potassium chloride SA (K-DUR,KLOR-CON) 20 MEQ tablet Take 20 mEq by mouth daily.       No current facility-administered medications for this visit.    Allergies  Allergen Reactions  . Codeine Anaphylaxis    swelling  . Lidocaine Anaphylaxis  . Amlodipine   . Atenolol Other (See Comments)    Unknown  . Bystolic [Nebivolol Hcl]     Cannot tolerate greater then 5 mg, causes chest pain  . Ciprofloxacin Itching and Nausea And Vomiting  . Levothyroxine     Patient reports allergy to generic products of levothyroxine, must have brand  . Metformin Nausea And Vomiting  . Metoprolol Other (See Comments)    Burns ears  . Morphine And Related Other (See Comments)    Makes patient feel like she is floating  . Penicillins Rash    History   Social History  . Marital Status: Married    Spouse Name: N/A    Number of Children: 3  . Years of Education: N/A   Occupational History  . retired    Social History Main Topics  . Smoking status: Former Smoker -- 1.00 packs/day for 25 years    Types: Cigarettes    Quit date: 05/23/2004  . Smokeless tobacco: Never Used  . Alcohol Use: No  . Drug Use: No  . Sexual Activity: Not Currently   Other Topics Concern  . Not on file   Social History Narrative  . No narrative on file     Review of Systems: General: negative for chills, fever, night sweats or weight changes.  Cardiovascular: negative for chest pain, dyspnea on exertion, edema, orthopnea, palpitations, paroxysmal nocturnal dyspnea or shortness of breath Dermatological: negative for  rash Respiratory: negative for cough or wheezing Urologic: negative for hematuria Abdominal: negative for nausea, vomiting, diarrhea, bright red blood per rectum, melena, or hematemesis Neurologic: negative for visual changes, syncope, or dizziness All other systems reviewed and are otherwise negative except as noted above.    Blood pressure 142/58, pulse 62, height 5\' 6"  (1.676 m), weight 191 lb (86.637 kg).  General appearance: alert and no distress  Neck: no adenopathy, no JVD, supple, symmetrical, trachea midline, thyroid not enlarged, symmetric, no tenderness/mass/nodules and bilateral carotid bruits Lungs: clear to auscultation bilaterally Heart: regular rate and rhythm, S1, S2 normal, no murmur, click, rub or gallop Extremities: extremities normal, atraumatic, no cyanosis or edema  EKG not performed today  ASSESSMENT AND PLAN:   Accelerated hypertension Under better control on current medications.  RAS (renal artery stenosis), s/p renal artery PTA X 2 Bilateral renal artery stenting with restenting using iCast  covered stents. With follow these  by duplex ultrasound. These have remained stable patent.  HYPERLIPIDEMIA, hx of statin induced myosistis Intolerant to statin drugs. Dr. Hulda Marin  at [facility] follows this      Runell Gess MD [facility], [facility] 04/26/2013 1:07 PM

## 2013-05-06 ENCOUNTER — Ambulatory Visit: Payer: Medicare Other | Admitting: Neurology

## 2013-05-06 ENCOUNTER — Other Ambulatory Visit: Payer: Self-pay

## 2013-05-06 ENCOUNTER — Telehealth: Payer: Self-pay | Admitting: Gastroenterology

## 2013-05-06 ENCOUNTER — Other Ambulatory Visit: Payer: Medicare Other

## 2013-05-06 DIAGNOSIS — R197 Diarrhea, unspecified: Secondary | ICD-10-CM

## 2013-05-06 NOTE — Telephone Encounter (Signed)
Spoke with pt and discussed her stool test with her. Pt had called and requested a "stook kit be mailed to her." Pt was mailed stool cards. Reviewed with pt that she actually had to come to our lab to pick up the kit and complete the test. Pt aware.

## 2013-05-10 ENCOUNTER — Other Ambulatory Visit: Payer: Medicare Other

## 2013-05-10 DIAGNOSIS — R197 Diarrhea, unspecified: Secondary | ICD-10-CM

## 2013-05-13 ENCOUNTER — Encounter (INDEPENDENT_AMBULATORY_CARE_PROVIDER_SITE_OTHER): Payer: Self-pay

## 2013-05-13 ENCOUNTER — Ambulatory Visit (INDEPENDENT_AMBULATORY_CARE_PROVIDER_SITE_OTHER): Payer: Medicare Other | Admitting: Neurology

## 2013-05-13 ENCOUNTER — Telehealth: Payer: Self-pay | Admitting: Cardiovascular Disease

## 2013-05-13 ENCOUNTER — Encounter: Payer: Self-pay | Admitting: Neurology

## 2013-05-13 VITALS — BP 153/53 | HR 53 | Ht 65.25 in | Wt 189.0 lb

## 2013-05-13 DIAGNOSIS — R413 Other amnesia: Secondary | ICD-10-CM

## 2013-05-13 DIAGNOSIS — F32A Depression, unspecified: Secondary | ICD-10-CM | POA: Insufficient documentation

## 2013-05-13 DIAGNOSIS — F329 Major depressive disorder, single episode, unspecified: Secondary | ICD-10-CM

## 2013-05-13 LAB — GASTROINTESTINAL PATHOGEN PANEL PCR
Campylobacter, PCR: NEGATIVE
E coli (ETEC) LT/ST PCR: NEGATIVE
E coli (STEC) stx1/stx2, PCR: NEGATIVE
E coli 0157, PCR: NEGATIVE
Norovirus, PCR: NEGATIVE
Salmonella, PCR: NEGATIVE
Shigella, PCR: NEGATIVE

## 2013-05-13 NOTE — Telephone Encounter (Signed)
Went to Insurance underwriter (Dr.Sadie) and he wants her to start taking 80mg  of Lasik twice a day and she was told to have the Physician who originally prescribe the medication to her .Marland KitchenMarland KitchenMarland KitchenPlease Call   Thanks

## 2013-05-13 NOTE — Progress Notes (Signed)
[facility] [address] [place]. [place] [address] [phone]       OFFICE CONSULT NOTE  [name] Date of Birth:  1945/12/07 Medical Record Number:  [id number]   Referring MD:  Butch Penny  Reason for Referral:  Memory loss  HPI: She returns for followup today after last visit 2 months ago. She states she's noticed slight improvement in her memory and depression after increasing the Lexapro to 40 mg daily which she seemed to be tolerating quite well. She states the ears are blocked and hurt and she has trouble hearing and understanding because of this. She forgot to do the lab work which I requested at last visit but is willing to do them today. She states her blood pressure much better and primary care physician seems to be happy with it though it is slightly elevated in office today at 153/53. She had EEG done on 03/05/13 which was normal and transcranial Doppler studies on 03/05/13 which showed mild proximal basilar stenosis and generalized increase in pulsatility indexes suggestive of age-related atherosclerosis. Carotid ultrasound showed mild 50-69% right ICA stenosis and mild plaques bilaterally.  Initial Consult 02/25/13 :85 year lady with memory diffficulties following a episode of headache, confusion and headache 2 months ago.She states she went to [facility] and was told she had a stroke but MRI scan 11/29/2012 reviewed by me shows no acute infarct but old bilateral cerebellar infarcts.She reports short term memory loss mainly and difficulty with recent events and retaining new information.She denies current headaches and confusion.She has h/o 2 strokes in last 5 years with mild residual right hand weakness, diminished fine motor skills and numbness.She had B 12 , TSHand RPR checked on 12/05/12 which were fine. She had EEG 12/05/12 which was also fine.2DEcho was fine.She has h/o depression for which she takes Lexapro 20 mg daily and feels it is well  controlled. She has no h/o seizures, head injury or family h/o dementia. ROS:   14 system review of systems is positive for fatigue, hearing loss, ear pain, ringing in the ears, cold intolerance, excessive thirst, diarrhea, chest tightness, chest pain, insomnia, apnea, daytime sleepiness, memory loss, dizziness, numbness, anxiety, back pain and walking difficulty. All other systems negative. PMH:  Past Medical History  Diagnosis Date  . HTN (hypertension)   . Elevated troponin level 09/28/11    Type II MI - not ACS related  . Diastolic dysfunction, left ventricle     with previous Diastolic HF  . Diabetes mellitus   . High cholesterol   . Kidney disease   . Cataracts, bilateral   . 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 Pulmonary HTN-severe- due to diastolic dysfunction 09/29/2011    Moderate to Severe Pulm HTN.  Marland Kitchen Acute renal failure 09/29/2011  . 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 HYPOTHYROIDISM 06/19/2008    Qualifier: Diagnosis of  By: Excell Seltzer CMA, Lawson Fiscal    . Renal artery stenosis     s/p stents (restenosis)  . Subclavian arterial stenosis   . Peripheral neuropathy   . Coronary artery disease   . Myocardial infarction   . Pericarditis   . Anxiety   . Depression   . Shortness of breath   . CHF (congestive heart failure)   . GERD (gastroesophageal reflux disease)   . Obstructive sleep apnea on nocturnal CPAP 06/20/2008    AHI-1.0/hr, AHI REM-7.5/hr  . Unstable angina   . Stroke   .  Arthritis   . Vertigo   . Complication of anesthesia   . PONV (postoperative nausea and vomiting)   . Family history of anesthesia complication     MOTHER ALSO HAD NAUSEA  . Blood clot in abdominal vein 01/09/2013  . History of nuclear stress test 10/10/2011    lexiscan; no perfusion defects     Social History:  History   Social History  . Marital Status: Married    Spouse Name: N/A    Number of Children: 3  .  Years of Education: N/A   Occupational History  . retired    Social History Main Topics  . Smoking status: Former Smoker -- 1.00 packs/day for 25 years    Types: Cigarettes    Quit date: 05/23/2004  . Smokeless tobacco: Never Used  . Alcohol Use: No  . Drug Use: No  . Sexual Activity: Not Currently   Other Topics Concern  . Not on file   Social History Narrative  . No narrative on file    Medications:   Current Outpatient Prescriptions on File Prior to Visit  Medication Sig Dispense Refill  . ALPRAZolam (XANAX XR) 1 MG 24 hr tablet Take 1 mg by mouth daily as needed (blood pressure or anxiety). Anxiety      . aspirin EC 81 MG tablet Take 81 mg by mouth daily.      . B Complex-C (SUPER B COMPLEX PO) Take by mouth daily.      . B-D ULTRAFINE III SHORT PEN 31G X 8 MM MISC       . Biotin 5000 MCG CAPS Take 10,000 mcg by mouth daily.       . Cholecalciferol (VITAMIN D) 2000 UNITS tablet Take 2,000 Units by mouth every evening.       . cloNIDine (CATAPRES - DOSED IN MG/24 HR) 0.3 mg/24hr Place 1 patch onto the skin once a week. Change on Tuesdays      . cloNIDine (CATAPRES) 0.1 MG tablet Take 0.1 mg by mouth daily as needed (when BP is over 170).      . clopidogrel (PLAVIX) 75 MG tablet Take 1 tablet (75 mg total) by mouth daily with breakfast.  30 tablet  11  . Coenzyme Q10 (COQ-10) 200 MG CAPS Take 200 mg by mouth daily.      Marland Kitchen escitalopram (LEXAPRO) 20 MG tablet Take 20 mg by mouth daily.      . furosemide (LASIX) 40 MG tablet Take 80 mg by mouth 2 (two) times daily.       . hydrALAZINE (APRESOLINE) 50 MG tablet Take 100 mg by mouth 3 (three) times daily.      . insulin aspart (NOVOLOG) 100 UNIT/ML injection Inject 10-20 Units into the skin 3 (three) times daily with meals. Takes 10 units with breakfast, 10 units with lunch, and 20 units with supper      . insulin glargine (LANTUS) 100 UNIT/ML injection Inject 65 Units into the skin at bedtime.       . irbesartan (AVAPRO) 300 MG  tablet Take 1 tablet (300 mg total) by mouth daily.  30 tablet  5  . isosorbide mononitrate (IMDUR) 60 MG 24 hr tablet TAKE 1 TABLET ONCE DAILY.  30 tablet  11  . levothyroxine (SYNTHROID, LEVOTHROID) 112 MCG tablet Take 112 mcg by mouth daily.        . nebivolol (BYSTOLIC) 5 MG tablet Take 1 tablet (5 mg total) by mouth daily.  90 tablet  3  .  niacin (NIASPAN) 500 MG CR tablet Take 250-500 mg by mouth 2 (two) times daily. 500mg  in the morning, 250mg  in the afternoon      . Omega-3 Fatty Acids (FISH OIL TRIPLE STRENGTH PO) Take 2 capsules by mouth 2 (two) times daily.       Marland Kitchen omeprazole (PRILOSEC) 40 MG capsule       . potassium chloride SA (K-DUR,KLOR-CON) 20 MEQ tablet Take 20 mEq by mouth daily.       No current facility-administered medications on file prior to visit.    Allergies:   Allergies  Allergen Reactions  . Codeine Anaphylaxis    swelling  . Lidocaine Anaphylaxis  . Amlodipine   . Atenolol Other (See Comments)    Unknown  . Bystolic [Nebivolol Hcl]     Cannot tolerate greater then 5 mg, causes chest pain  . Ciprofloxacin Itching and Nausea And Vomiting  . Levothyroxine     Patient reports allergy to generic products of levothyroxine, must have brand  . Metformin Nausea And Vomiting  . Metoprolol Other (See Comments)    Burns ears  . Morphine And Related Other (See Comments)    Makes patient feel like she is floating  . Penicillins Rash   Filed Vitals:   05/13/13 1428  BP: 153/53  Pulse: 53    Physical Exam General: obese middle aged lady, seated, in no evident distress Head: head normocephalic and atraumatic. Orohparynx benign Neck: supple with soft carotid bruits Cardiovascular: regular rate and rhythm, no murmurs Musculoskeletal: no deformity Skin:  no rash/petichiae Vascular:  Distal LE pulses not felt Neurologic Exam Mental Status: Awake and fully alert. Oriented to place and time. MMSE 25/30 with deficits in recall and attention.  fund of knowledge  appropriate. Mood and affect  But Geriatric depression scale 9 suggestive of mild depression.  Cranial Nerves: Fundoscopic exam reveals sharp disc margins. Pupils equal, briskly reactive to light. Extraocular movements full without nystagmus. Visual fields full to confrontation. Hearing diminished on right. Facial sensation intact. Face, tongue, palate moves normally and symmetrically.  Motor: Normal bulk and tone. Normal strength in all tested extremity muscles.diminished fine finger movements on right and orbits left over right upper extremity. Sensory.: intact to touch and pinprick and  Diminished vibratory sensation bilateral toes.  Coordination: Rapid alternating movements normal in all extremities. Finger-to-nose and heel-to-shin performed accurately bilaterally. Gait and Station: Arises from chair without difficulty. Stance is normal. Gait demonstrates normal stride length and balance . Not able to heel, toe and tandem walk without difficulty.  Reflexes: 1+ and symmetric except ankle jerks are depressed. Toes downgoing.    ASSESSMENT: 89 year lady with  Mild memory difficulties likely from suboptimally treated depression. Remote h/o bilateral cerebellar strokes.     PLAN: Continue Plavix for stroke prevention with strict control of hypertension with blood pressure goal below 120/80, diabetes with 1 A1c goal below 6.5% and lipids with LDL cholesterol goal below 70 mg percent. Check   TSH  Return for followup in 3 months with Heide Guile, NP or call earlier if necessary.

## 2013-05-13 NOTE — Telephone Encounter (Signed)
Returned call.  Left message to call back tomorrow before 4pm.  Pt will need to have Dr. Meryl Dare prescribe increased dose of Lasix since he increased dose.

## 2013-05-13 NOTE — Patient Instructions (Signed)
Continue Plavix for stroke prevention and strict control of hypertension the blood pressure goal below 130/90. Continue Lexapro 40 mg daily for depression. Check vitamin B12 and TSH level today. Return for followup in 3 months with Larita Fife, nurse practitioner call earlier if necessary.

## 2013-05-14 ENCOUNTER — Other Ambulatory Visit: Payer: Self-pay

## 2013-05-14 ENCOUNTER — Other Ambulatory Visit: Payer: Medicare Other

## 2013-05-14 LAB — HEMOCCULT SLIDES (X 3 CARDS)
OCCULT 3: NEGATIVE
OCCULT 4: NEGATIVE

## 2013-05-14 LAB — TSH: TSH: 2.57 u[IU]/mL (ref 0.450–4.500)

## 2013-05-14 MED ORDER — METRONIDAZOLE 250 MG PO TABS: 250.0000 mg | ORAL_TABLET | Freq: Three times a day (TID) | ORAL | Status: DC

## 2013-05-14 NOTE — Telephone Encounter (Signed)
Returned call.  Left message to call back before 4pm.  

## 2013-05-14 NOTE — Telephone Encounter (Signed)
Pt called back and verified x 2.  Pt informed message received yesterday and advised Dr. Meryl Dare will need to send in Rx w/ increased dose since it was changed there.  Pt stated Dr. Casper Harrison nurse told her to contact Dr. Hazle Coca office since he is the one that originally wrote for Lasix.  Pt informed that we cannot increase her medicine w/o documentation from Dr. Casper Harrison office.  Pt informed she will need to have notes sent to our office r/t increase and why for Dr. Allyson Sabal to approve increase.  Informed it is not that we do not believe her, but must have documentation to support an increase in medication.  Pt verbalized understanding and agreed w/ plan.  Pt informed Samara Deist, RN will be notified to discuss w/ Dr. Allyson Sabal and pt will await response from Dr. Allyson Sabal as to if she should increase Lasix or not.  Corine Shelter, PA-C notified and reviewed chart.  Stated pt is at 80 mg BID and shouldn't be on any higher dose of Lasix from cardiology standpoint.  Advised pt come in for BP check w/ him next week and bring in all meds.   Call to pt and informed.  RN asked pt to get pill bottle for furosemide or Lasix.  Pt stated it reads: furosemide 40 mg Take one tablet twice a day.  Pt stated BP has been in 130s since she started the new med.  Pt advised per Corine Shelter, PA-C to come in for BP check and bring in ALL meds.  Pt verbalized understanding and agreed w/ plan.  Appt scheduled for 12.30.14 at 3pm w/ Corine Shelter, PA-C for BP check.  Pt stated she wrote appt down.  Pt also advised to continue to take what she has been taking until seen and it will be determined if an increased dose is needed at that time.  Pt verbalized understanding and agreed w/ plan.

## 2013-05-21 ENCOUNTER — Telehealth (HOSPITAL_COMMUNITY): Payer: Self-pay | Admitting: *Deleted

## 2013-05-21 ENCOUNTER — Ambulatory Visit (INDEPENDENT_AMBULATORY_CARE_PROVIDER_SITE_OTHER): Payer: Medicare Other | Admitting: Cardiology

## 2013-05-21 ENCOUNTER — Encounter: Payer: Self-pay | Admitting: Cardiology

## 2013-05-21 VITALS — BP 140/60 | HR 72 | Ht 65.0 in | Wt 190.0 lb

## 2013-05-21 DIAGNOSIS — I10 Essential (primary) hypertension: Secondary | ICD-10-CM

## 2013-05-21 DIAGNOSIS — E785 Hyperlipidemia, unspecified: Secondary | ICD-10-CM

## 2013-05-21 DIAGNOSIS — I701 Atherosclerosis of renal artery: Secondary | ICD-10-CM

## 2013-05-21 DIAGNOSIS — G4730 Sleep apnea, unspecified: Secondary | ICD-10-CM

## 2013-05-21 DIAGNOSIS — I5032 Chronic diastolic (congestive) heart failure: Secondary | ICD-10-CM

## 2013-05-21 DIAGNOSIS — R413 Other amnesia: Secondary | ICD-10-CM

## 2013-05-21 DIAGNOSIS — N183 Chronic kidney disease, stage 3 unspecified: Secondary | ICD-10-CM

## 2013-05-21 NOTE — Patient Instructions (Signed)
Your physician recommends that you schedule a follow-up appointment in: 3 months with Dr Berry 

## 2013-05-21 NOTE — Assessment & Plan Note (Signed)
She has chronic difficult to control HTN. Currently it's under good control for her.

## 2013-05-21 NOTE — Assessment & Plan Note (Signed)
Recently evaluated by Dr Pearlean Brownie

## 2013-05-21 NOTE — Assessment & Plan Note (Signed)
Followed by Dr. Berry. 

## 2013-05-21 NOTE — Assessment & Plan Note (Signed)
Currently compensated 

## 2013-05-21 NOTE — Progress Notes (Signed)
05/21/2013 [name]   1946/04/26  [id number]  Primary Physicia Alice Reichert, MD Primary Cardiologist: Dr Allyson Sabal  HPI:  The patient is a 67 year old obese female, mother of 3, grandmother to 6 grandchildren who is well known to Dr Allyson Sabal. She has a history of normal coronary arteries by cath January 28, 2011, and May 2013. Echo in July 2014 showed normal LV function and grade 3 diastolic dysfunction. She does have PVOD status post bilateral renal artery stenting with reintervention using iCAST covered stents for "in-stent restenosis." She had a RCIA PTA Aug 2014 and at that time her renal arteries were patent. She has difficult-to-control hypertension, hyperlipidemia, insulin-dependent diabetes and hypothyroidism. She also has obstructive sleep apnea not on CPAP. She has had multiple medication intolerances. She had statin induced myositis and required steroids in the past.  Dr. Hulda Marin at [facility] follows her hyperlipidemia now. She was admitted this past spring with a question of a CVA, she was worked up in [place]. Recently she saw Dr Pearlean Brownie for "memory loss". He suggested she increase her Lasix to 80 mg BID but Dr Allyson Sabal felt we should see her first.              She tells me she has had good B/P control, for her, but has some side effects from Hydralazine that she is concerned about. She says that after she takes her Hydralazine she feels like her heart is pounding. She is worried the Hydralazine is "wearing out" her heart. She also says she had a "stroke" in May but  That wasn't mentioned in Dr Marlis Edelson note or seen on her MRI in July.    Current Outpatient Prescriptions  Medication Sig Dispense Refill  . ALPRAZolam (XANAX XR) 1 MG 24 hr tablet Take 1 mg by mouth daily as needed (blood pressure or anxiety). Anxiety      . amoxicillin-clavulanate (AUGMENTIN) 875-125 MG per tablet       . aspirin EC 81 MG tablet Take 81 mg by mouth daily.      . B Complex-C (SUPER B COMPLEX PO) Take by  mouth daily.      . B-D ULTRAFINE III SHORT PEN 31G X 8 MM MISC       . Biotin 5000 MCG CAPS Take 10,000 mcg by mouth daily.       . Cholecalciferol (VITAMIN D) 2000 UNITS tablet Take 2,000 Units by mouth every evening.       . cloNIDine (CATAPRES - DOSED IN MG/24 HR) 0.3 mg/24hr Place 1 patch onto the skin once a week. Change on Tuesdays      . cloNIDine (CATAPRES) 0.1 MG tablet Take 0.1 mg by mouth daily as needed (when BP is over 170).      . clopidogrel (PLAVIX) 75 MG tablet Take 1 tablet (75 mg total) by mouth daily with breakfast.  30 tablet  11  . Coenzyme Q10 (COQ-10) 200 MG CAPS Take 200 mg by mouth daily.      Marland Kitchen escitalopram (LEXAPRO) 20 MG tablet Take 20 mg by mouth daily.      . furosemide (LASIX) 40 MG tablet Take 40 mg by mouth 2 (two) times daily.       . hydrALAZINE (APRESOLINE) 50 MG tablet Take 100 mg by mouth 3 (three) times daily.      . insulin aspart (NOVOLOG) 100 UNIT/ML injection Inject 10-20 Units into the skin 3 (three) times daily with meals. Takes 10 units with breakfast, 10 units with  lunch, and 20 units with supper      . insulin glargine (LANTUS) 100 UNIT/ML injection Inject 65 Units into the skin at bedtime.       . irbesartan (AVAPRO) 300 MG tablet Take 1 tablet (300 mg total) by mouth daily.  30 tablet  5  . isosorbide mononitrate (IMDUR) 60 MG 24 hr tablet TAKE 1 TABLET ONCE DAILY.  30 tablet  11  . levothyroxine (SYNTHROID, LEVOTHROID) 112 MCG tablet Take 112 mcg by mouth daily.        . nebivolol (BYSTOLIC) 5 MG tablet Take 1 tablet (5 mg total) by mouth daily.  90 tablet  3  . neomycin-polymyxin-hydrocortisone (CORTISPORIN) 3.5-10000-1 otic suspension       . niacin (NIASPAN) 500 MG CR tablet Take 250-500 mg by mouth 2 (two) times daily. 500mg  in the morning, 250mg  in the afternoon      . Omega-3 Fatty Acids (FISH OIL TRIPLE STRENGTH PO) Take 2 capsules by mouth 2 (two) times daily.       . potassium chloride SA (K-DUR,KLOR-CON) 20 MEQ tablet Take 20 mEq by  mouth daily.      Marland Kitchen omeprazole (PRILOSEC) 40 MG capsule        No current facility-administered medications for this visit.    Allergies  Allergen Reactions  . Codeine Anaphylaxis    swelling  . Lidocaine Anaphylaxis  . Amlodipine   . Atenolol Other (See Comments)    Unknown  . Bystolic [Nebivolol Hcl]     Cannot tolerate greater then 5 mg, causes chest pain  . Ciprofloxacin Itching and Nausea And Vomiting  . Levothyroxine     Patient reports allergy to generic products of levothyroxine, must have brand  . Metformin Nausea And Vomiting  . Metoprolol Other (See Comments)    Burns ears  . Morphine And Related Other (See Comments)    Makes patient feel like she is floating  . Penicillins Rash    History   Social History  . Marital Status: Married    Spouse Name: N/A    Number of Children: 3  . Years of Education: N/A   Occupational History  . retired    Social History Main Topics  . Smoking status: Former Smoker -- 1.00 packs/day for 25 years    Types: Cigarettes    Quit date: 05/23/2004  . Smokeless tobacco: Never Used  . Alcohol Use: No  . Drug Use: No  . Sexual Activity: Not Currently   Other Topics Concern  . Not on file   Social History Narrative  . No narrative on file     Review of Systems: General: negative for chills, fever, night sweats or weight changes.  Cardiovascular: negative for chest pain, dyspnea on exertion, edema, orthopnea, palpitations, paroxysmal nocturnal dyspnea or shortness of breath Dermatological: negative for rash Respiratory: negative for cough or wheezing Urologic: negative for hematuria Abdominal: negative for nausea, vomiting, diarrhea, bright red blood per rectum, melena, or hematemesis Neurologic: negative for visual changes, syncope, or dizziness All other systems reviewed and are otherwise negative except as noted above.    Blood pressure 140/60, pulse 72, height 5\' 5"  (1.651 m), weight 190 lb (86.183 kg).  General  appearance: alert, cooperative, no distress and moderate distress Lungs: clear to auscultation bilaterally Heart: regular rate and rhythm    ASSESSMENT AND PLAN:   Accelerated hypertension She has chronic difficult to control HTN. Currently it's under good control for her.  RAS (renal artery stenosis), s/p  renal artery PTA X 2 Followed by Dr Allyson Sabal  Obstructive sleep apnea on nocturnal CPAP .  HYPERLIPIDEMIA, hx of statin induced myosistis .  Chronic renal insufficiency, stage III (moderate) .  Normal coronary angiogram, 10/20/11  Nl LVF .  Chronic diastolic heart failure- grade 3 by echo July 2014 Currently compensated  Short-term memory loss Recently evaluated by Dr Caprice Renshaw  I reviewed her medications with our pharmacist. Her B/P is currently controlled. I am hesitant to increase her Lasix to 80 mg BID with her CRI. We discussed adding Maxzide but she would need close renal follow up. In the end the pt says she was just worried the Hydralazine was hurting her heart. I reassured her this was not the case. She will continue her current medicines. I did order a BMP today. She will follow up with Dr Mariel Aloe in a few months.  [facility] KPA-C 05/21/2013 3:39 PM

## 2013-05-22 LAB — BASIC METABOLIC PANEL
BUN: 46 mg/dL — ABNORMAL HIGH (ref 6–23)
CO2: 24 mEq/L (ref 19–32)
Calcium: 9.4 mg/dL (ref 8.4–10.5)
Chloride: 102 mEq/L (ref 96–112)
Creat: 1.68 mg/dL — ABNORMAL HIGH (ref 0.50–1.10)
Glucose, Bld: 147 mg/dL — ABNORMAL HIGH (ref 70–99)
Potassium: 4.2 mEq/L (ref 3.5–5.3)
Sodium: 137 mEq/L (ref 135–145)

## 2013-05-24 ENCOUNTER — Other Ambulatory Visit: Payer: Self-pay | Admitting: *Deleted

## 2013-05-24 DIAGNOSIS — R5381 Other malaise: Secondary | ICD-10-CM

## 2013-05-24 DIAGNOSIS — R5383 Other fatigue: Principal | ICD-10-CM

## 2013-05-27 ENCOUNTER — Telehealth: Payer: Self-pay | Admitting: Gastroenterology

## 2013-05-27 NOTE — Telephone Encounter (Signed)
Pt states that as soon as she eats the food is running right through her. States she has a problem making it to the bathroom on time. Pt requests to be seen. Pt scheduled to see Nicoletta Ba PA tomorrow at 3pm. Pt aware of appt.

## 2013-05-27 NOTE — Telephone Encounter (Signed)
Left message for pt to call back  °

## 2013-05-28 ENCOUNTER — Ambulatory Visit: Payer: Medicare Other | Admitting: Physician Assistant

## 2013-06-10 ENCOUNTER — Other Ambulatory Visit (HOSPITAL_COMMUNITY): Payer: Self-pay | Admitting: Cardiovascular Disease

## 2013-06-10 DIAGNOSIS — I701 Atherosclerosis of renal artery: Secondary | ICD-10-CM

## 2013-06-11 ENCOUNTER — Encounter (HOSPITAL_COMMUNITY): Payer: Self-pay | Admitting: *Deleted

## 2013-06-12 ENCOUNTER — Ambulatory Visit: Payer: Self-pay | Admitting: Neurology

## 2013-06-13 ENCOUNTER — Encounter: Payer: Self-pay | Admitting: Gastroenterology

## 2013-06-13 ENCOUNTER — Ambulatory Visit (INDEPENDENT_AMBULATORY_CARE_PROVIDER_SITE_OTHER): Payer: Medicare Other | Admitting: Gastroenterology

## 2013-06-13 VITALS — BP 160/50 | HR 56 | Ht 64.0 in | Wt 190.2 lb

## 2013-06-13 DIAGNOSIS — Z86010 Personal history of colonic polyps: Secondary | ICD-10-CM

## 2013-06-13 DIAGNOSIS — R197 Diarrhea, unspecified: Secondary | ICD-10-CM

## 2013-06-13 MED ORDER — RIFAXIMIN 200 MG PO TABS: 200.0000 mg | ORAL_TABLET | Freq: Three times a day (TID) | ORAL | Status: DC

## 2013-06-13 NOTE — Progress Notes (Signed)
          History of Present Illness:  The patient has returned for followup of diarrhea.  Symptoms continue.  She's afraid to leave her home because of severe urgency with diarrhea.  Symptoms did not improve after holding Protonix.  Stool pathogen panel was negative.    Review of Systems: Pertinent positive and negative review of systems were noted in the above HPI section. All other review of systems were otherwise negative.    Current Medications, Allergies, Past Medical History, Past Surgical History, Family History and Social History were reviewed in [place] record  Vital signs were reviewed in today's medical record. Physical Exam: General: Well developed , well nourished, no acute distress   See Assessment and Plan under Problem List

## 2013-06-13 NOTE — Assessment & Plan Note (Signed)
Multiple large adenomatous polyps were removed in 2013.  Colonoscopy complicated by post-polypectomy bleeding.  Recommendations #1 patient is due for followup colonoscopy.  This will be deferred until diarrhea pending response to antibiotics

## 2013-06-13 NOTE — Patient Instructions (Signed)
Call back in one week with progress We are sending in a script to your pharmacy

## 2013-06-13 NOTE — Assessment & Plan Note (Signed)
Diarrhea continues to be a severe problem which is interfering with her activities of daily living.  Etiology has not been determined.  There is no evidence for infection.  Bacterial overgrowth remains a concern.  Microscopic colitis must also be considered.   Recommendations #1 empiric xifixan #2 if no improvement will obtain random colon biopsies to rule out microscopic colitis

## 2013-06-14 ENCOUNTER — Telehealth: Payer: Self-pay | Admitting: Cardiovascular Disease

## 2013-06-14 ENCOUNTER — Telehealth: Payer: Self-pay | Admitting: Gastroenterology

## 2013-06-14 MED ORDER — HYDRALAZINE HCL 100 MG PO TABS: 100.0000 mg | ORAL_TABLET | Freq: Three times a day (TID) | ORAL | Status: DC

## 2013-06-14 NOTE — Telephone Encounter (Signed)
Returned call and pt verified x 2.  Pt stated Dr. Kennon Holter pharmacist increased the hydralazine to 100 mg three times a day.  Stated she has 50 mg tabs and is about to run out.  Pt stated she needs a refill.  Refill(s) sent to pharmacy for 90-day supply per pt request.

## 2013-06-14 NOTE — Telephone Encounter (Signed)
Please call her-need to verify how she is suppose to be taking Hydralazine 50mg .

## 2013-06-17 MED ORDER — METRONIDAZOLE 250 MG PO TABS: 250.0000 mg | ORAL_TABLET | Freq: Three times a day (TID) | ORAL | Status: DC

## 2013-06-17 MED ORDER — SULFAMETHOXAZOLE-TRIMETHOPRIM 400-80 MG PO TABS: 1.0000 | ORAL_TABLET | Freq: Two times a day (BID) | ORAL | Status: DC

## 2013-06-17 NOTE — Telephone Encounter (Signed)
One week of Bactrim one double strength twice a day and Flagyl 250 mg 3 times a day

## 2013-06-17 NOTE — Telephone Encounter (Signed)
Sent in bactrim and flagyl for patient  Notified patient

## 2013-06-17 NOTE — Telephone Encounter (Signed)
Dr Deatra Ina patient can not afford xifaxian   What can we sent her

## 2013-06-19 ENCOUNTER — Ambulatory Visit (HOSPITAL_COMMUNITY)
Admission: RE | Admit: 2013-06-19 | Discharge: 2013-06-19 | Disposition: A | Discharge status: Home / Self Care | Payer: Medicare Other | Source: Ambulatory Visit | Attending: Cardiovascular Disease | Admitting: Cardiovascular Disease

## 2013-06-19 DIAGNOSIS — I701 Atherosclerosis of renal artery: Secondary | ICD-10-CM | POA: Insufficient documentation

## 2013-06-19 DIAGNOSIS — E119 Type 2 diabetes mellitus without complications: Secondary | ICD-10-CM | POA: Insufficient documentation

## 2013-06-19 DIAGNOSIS — I10 Essential (primary) hypertension: Secondary | ICD-10-CM | POA: Insufficient documentation

## 2013-06-19 NOTE — Progress Notes (Signed)
Renal Artery Duplex Completed. °Brianna L Mazza,RVT °

## 2013-06-26 ENCOUNTER — Telehealth: Payer: Self-pay | Admitting: Gastroenterology

## 2013-06-26 NOTE — Telephone Encounter (Signed)
Pt states that she finished the Bactrim and the Flagyl. States the Bactrim caused her terrible nausea but reports that neither one of the antibiotics helped with the diarrhea. Pt was supposed to call back and let us know how the meds worked but they did not help. Please advise.

## 2013-06-26 NOTE — Telephone Encounter (Signed)
Please schedule repeat colonoscopy

## 2013-06-27 NOTE — Telephone Encounter (Signed)
Spoke with pt and she is aware. States she will look at her schedule and call back and schedule.

## 2013-07-01 ENCOUNTER — Telehealth: Payer: Self-pay | Admitting: *Deleted

## 2013-07-01 ENCOUNTER — Telehealth: Payer: Self-pay | Admitting: Gastroenterology

## 2013-07-01 ENCOUNTER — Encounter: Payer: Self-pay | Admitting: *Deleted

## 2013-07-01 DIAGNOSIS — I701 Atherosclerosis of renal artery: Secondary | ICD-10-CM

## 2013-07-01 NOTE — Telephone Encounter (Signed)
Message copied by Chauncy Lean on Mon Jul 01, 2013  7:57 PM ------      Message from: Lorretta Harp      Created: Wed Jun 26, 2013  3:32 PM       No change from prior study. Repeat in 6 months ------

## 2013-07-01 NOTE — Telephone Encounter (Signed)
Daughter called for pt to schedule the colonoscopy. States her mother has to be admitted to the hospital for the prep due to her being such a bad diabetic and her sugar dropping so low. Please advise when you would want to do this.

## 2013-07-01 NOTE — Telephone Encounter (Signed)
Order placed for repeat renal dopplers in 6 months

## 2013-07-02 ENCOUNTER — Other Ambulatory Visit: Payer: Self-pay

## 2013-07-02 DIAGNOSIS — R109 Unspecified abdominal pain: Secondary | ICD-10-CM

## 2013-07-02 NOTE — Telephone Encounter (Signed)
yes

## 2013-07-02 NOTE — Telephone Encounter (Signed)
Does she need to be admitted for the prep?

## 2013-07-02 NOTE — Telephone Encounter (Signed)
Spoke with pt and since she is to be admitted for the prep Dr. Deatra Ina is the hospital doctor next week. Would like to admit her 07/10/13 for prep and Colon be done 07/11/13@9am . Letter sent to Dr. Gwenlyn Found regarding Plavix instructions.

## 2013-07-02 NOTE — Telephone Encounter (Signed)
This can be scheduled for noon time case any day that I am not in the [facility]

## 2013-07-02 NOTE — Telephone Encounter (Signed)
Pt would like to have this scheduled anytime after 07/16/13.

## 2013-07-03 ENCOUNTER — Telehealth: Payer: Self-pay | Admitting: *Deleted

## 2013-07-04 ENCOUNTER — Telehealth: Payer: Self-pay | Admitting: *Deleted

## 2013-07-04 NOTE — Telephone Encounter (Signed)
Message copied by Chauncy Lean on Thu Jul 04, 2013  9:42 AM ------      Message from: HUNT, Virginia R      Created: Wed Jul 03, 2013 10:29 AM      Regarding: Colonoscopy       Curt Bears,            I called this am and they told me you were off site at a mtg. I sent Dr. Gwenlyn Found a message on this pt regarding her Plavix. We have scheduled her to have a colonoscopy done at [facility] 07/11/13 but she is currently taking Plavix. We need to know if she can stop it prior to Colon. I wanted to touch base with you regarding this because I did not know how well Dr. Gwenlyn Found checks his staff messages. Please help me with this.            Thanks,      Rosanne Sack RN ------

## 2013-07-04 NOTE — Telephone Encounter (Signed)
Chauncy Lean, RN Maury Dus, RN            Vaughan Basta, I received a verbal order from Dr Gwenlyn Found that Ms. Kohlmann can hold her Plavix prior to the colonoscopy. Usually, patients hold the Plavix 5-7 days prior to the procedure and restart post procedure when the GI MD feels comfortable. If I can do anything else for you just let me know!   Have a great day!   Thanks,  Brita Romp    Spoke with pt and she is aware.Pt will stop Plavix today.

## 2013-07-04 NOTE — Telephone Encounter (Signed)
Verbal order received from Dr Gwenlyn Found that Ms Graw can hold Plavix prior to colonoscopy. Message sent to [facility]

## 2013-07-05 ENCOUNTER — Encounter (HOSPITAL_COMMUNITY): Payer: Self-pay | Admitting: Pharmacy Technician

## 2013-07-05 ENCOUNTER — Telehealth: Payer: Self-pay | Admitting: Gastroenterology

## 2013-07-05 NOTE — Telephone Encounter (Signed)
Spoke with pt and bed control. Her admission is confirmed for 07/10/13. Pt aware.

## 2013-07-10 ENCOUNTER — Telehealth: Payer: Self-pay | Admitting: Gastroenterology

## 2013-07-10 ENCOUNTER — Observation Stay (HOSPITAL_COMMUNITY)
Admission: RE | Admit: 2013-07-10 | Discharge: 2013-07-12 | Disposition: A | Discharge status: Home / Self Care | Payer: Medicare Other | Source: Ambulatory Visit | Attending: Gastroenterology | Admitting: Gastroenterology

## 2013-07-10 ENCOUNTER — Encounter (HOSPITAL_COMMUNITY): Payer: Self-pay | Admitting: Unknown Physician Specialty

## 2013-07-10 DIAGNOSIS — I252 Old myocardial infarction: Secondary | ICD-10-CM | POA: Insufficient documentation

## 2013-07-10 DIAGNOSIS — I2789 Other specified pulmonary heart diseases: Secondary | ICD-10-CM | POA: Insufficient documentation

## 2013-07-10 DIAGNOSIS — R197 Diarrhea, unspecified: Principal | ICD-10-CM

## 2013-07-10 DIAGNOSIS — Z860100 Personal history of colon polyps, unspecified: Secondary | ICD-10-CM | POA: Insufficient documentation

## 2013-07-10 DIAGNOSIS — J449 Chronic obstructive pulmonary disease, unspecified: Secondary | ICD-10-CM | POA: Insufficient documentation

## 2013-07-10 DIAGNOSIS — D649 Anemia, unspecified: Secondary | ICD-10-CM | POA: Insufficient documentation

## 2013-07-10 DIAGNOSIS — J3489 Other specified disorders of nose and nasal sinuses: Secondary | ICD-10-CM | POA: Insufficient documentation

## 2013-07-10 DIAGNOSIS — I2510 Atherosclerotic heart disease of native coronary artery without angina pectoris: Secondary | ICD-10-CM | POA: Insufficient documentation

## 2013-07-10 DIAGNOSIS — Z8673 Personal history of transient ischemic attack (TIA), and cerebral infarction without residual deficits: Secondary | ICD-10-CM | POA: Insufficient documentation

## 2013-07-10 DIAGNOSIS — R29898 Other symptoms and signs involving the musculoskeletal system: Secondary | ICD-10-CM | POA: Insufficient documentation

## 2013-07-10 DIAGNOSIS — R0681 Apnea, not elsewhere classified: Secondary | ICD-10-CM | POA: Insufficient documentation

## 2013-07-10 DIAGNOSIS — R159 Full incontinence of feces: Secondary | ICD-10-CM | POA: Insufficient documentation

## 2013-07-10 DIAGNOSIS — E119 Type 2 diabetes mellitus without complications: Secondary | ICD-10-CM

## 2013-07-10 DIAGNOSIS — E039 Hypothyroidism, unspecified: Secondary | ICD-10-CM | POA: Insufficient documentation

## 2013-07-10 DIAGNOSIS — R109 Unspecified abdominal pain: Secondary | ICD-10-CM

## 2013-07-10 DIAGNOSIS — J4489 Other specified chronic obstructive pulmonary disease: Secondary | ICD-10-CM | POA: Insufficient documentation

## 2013-07-10 DIAGNOSIS — G4733 Obstructive sleep apnea (adult) (pediatric): Secondary | ICD-10-CM | POA: Insufficient documentation

## 2013-07-10 DIAGNOSIS — E785 Hyperlipidemia, unspecified: Secondary | ICD-10-CM

## 2013-07-10 DIAGNOSIS — I10 Essential (primary) hypertension: Secondary | ICD-10-CM | POA: Insufficient documentation

## 2013-07-10 DIAGNOSIS — I739 Peripheral vascular disease, unspecified: Secondary | ICD-10-CM | POA: Insufficient documentation

## 2013-07-10 LAB — GLUCOSE, CAPILLARY
GLUCOSE-CAPILLARY: 115 mg/dL — AB (ref 70–99)
Glucose-Capillary: 112 mg/dL — ABNORMAL HIGH (ref 70–99)
Glucose-Capillary: 95 mg/dL (ref 70–99)

## 2013-07-10 MED ORDER — ISOSORBIDE MONONITRATE ER 60 MG PO TB24
60.0000 mg | ORAL_TABLET | Freq: Every day | ORAL | Status: DC
  Administered 2013-07-10 – 2013-07-11 (×2): 60 mg via ORAL
  Filled 2013-07-10 (×3): qty 1

## 2013-07-10 MED ORDER — SODIUM CHLORIDE 0.9 % IV SOLN: INTRAVENOUS | Status: DC

## 2013-07-10 MED ORDER — PEG-KCL-NACL-NASULF-NA ASC-C 100 G PO SOLR
0.5000 | Freq: Once | ORAL | Status: AC
  Administered 2013-07-10: 100 g via ORAL
  Filled 2013-07-10: qty 1

## 2013-07-10 MED ORDER — HYDRALAZINE HCL 50 MG PO TABS
100.0000 mg | ORAL_TABLET | Freq: Three times a day (TID) | ORAL | Status: DC
  Administered 2013-07-10 – 2013-07-12 (×5): 100 mg via ORAL
  Filled 2013-07-10 (×8): qty 2

## 2013-07-10 MED ORDER — SODIUM CHLORIDE 0.9 % IV SOLN
INTRAVENOUS | Status: DC
  Administered 2013-07-10: 19:00:00 via INTRAVENOUS

## 2013-07-10 MED ORDER — NEBIVOLOL HCL 5 MG PO TABS
5.0000 mg | ORAL_TABLET | Freq: Every morning | ORAL | Status: DC
  Administered 2013-07-11 – 2013-07-12 (×2): 5 mg via ORAL
  Filled 2013-07-10 (×2): qty 1

## 2013-07-10 MED ORDER — IRBESARTAN 300 MG PO TABS
300.0000 mg | ORAL_TABLET | Freq: Every evening | ORAL | Status: DC
  Administered 2013-07-10 – 2013-07-11 (×2): 300 mg via ORAL
  Filled 2013-07-10 (×3): qty 1

## 2013-07-10 MED ORDER — INSULIN ASPART 100 UNIT/ML ~~LOC~~ SOLN
0.0000 [IU] | Freq: Three times a day (TID) | SUBCUTANEOUS | Status: DC
  Administered 2013-07-12: 3 [IU] via SUBCUTANEOUS

## 2013-07-10 MED ORDER — ESCITALOPRAM OXALATE 20 MG PO TABS
20.0000 mg | ORAL_TABLET | Freq: Two times a day (BID) | ORAL | Status: DC
  Administered 2013-07-10 – 2013-07-12 (×3): 20 mg via ORAL
  Filled 2013-07-10 (×5): qty 1

## 2013-07-10 MED ORDER — ONDANSETRON HCL 4 MG/2ML IJ SOLN: 4.0000 mg | Freq: Four times a day (QID) | INTRAMUSCULAR | Status: DC | PRN

## 2013-07-10 MED ORDER — ONDANSETRON HCL 4 MG PO TABS: 4.0000 mg | ORAL_TABLET | Freq: Four times a day (QID) | ORAL | Status: DC | PRN

## 2013-07-10 MED ORDER — POTASSIUM CHLORIDE CRYS ER 20 MEQ PO TBCR
20.0000 meq | EXTENDED_RELEASE_TABLET | Freq: Every day | ORAL | Status: DC
Start: 2013-07-11 — End: 2013-07-12
  Administered 2013-07-12: 20 meq via ORAL
  Filled 2013-07-10 (×2): qty 1

## 2013-07-10 MED ORDER — LEVOTHYROXINE SODIUM 112 MCG PO TABS: 112.0000 ug | ORAL_TABLET | Freq: Every day | ORAL | Status: DC

## 2013-07-10 MED ORDER — CLONIDINE HCL 0.3 MG/24HR TD PTWK: 0.3000 mg | MEDICATED_PATCH | TRANSDERMAL | Status: DC

## 2013-07-10 MED ORDER — ALPRAZOLAM ER 0.5 MG PO TB24
1.0000 mg | ORAL_TABLET | Freq: Every day | ORAL | Status: DC
  Administered 2013-07-10 – 2013-07-11 (×2): 1 mg via ORAL
  Filled 2013-07-10 (×2): qty 2

## 2013-07-10 MED ORDER — PEG-KCL-NACL-NASULF-NA ASC-C 100 G PO SOLR: 1.0000 | Freq: Once | ORAL | Status: DC

## 2013-07-10 MED ORDER — PEG-KCL-NACL-NASULF-NA ASC-C 100 G PO SOLR
0.5000 | Freq: Once | ORAL | Status: AC
  Administered 2013-07-11: 100 g via ORAL
  Filled 2013-07-10: qty 1

## 2013-07-10 NOTE — H&P (Signed)
_                                                                                                              History of Present Illness:  68 year old white female with multiple medical problems including diabetes, history of strep endocarditis, pulmonary hypertension, hypothyroidism, coronary artery disease, congestive heart failure, obstructive sleep apnea, history of CVA, colonic polyposis referred for evaluation of diarrhea and incontinence.  Over the past 6-60months she's developed a diarrhea consisting of postprandial diarrhea at least 3-4 times a day and sometimes awakening her.  Stools are watery and accompanied by severe urgency.  She occasionally has incontinence.  In addition, she may pass small amounts of stool when she has to pass gas.  Prior to this she was having solid stools.  Omeprazole started about 5 months ago.  There have been no other medication changes preceding diarrhea.  She has not taken antibiotics.  In may, 2013 multiple sessile colon polyps were removed.  She developed recurrent post polypectomy bleeding requiring therapeutic intervention x3.  She has had no recurrences of lower GI bleeding.  She is on Plavix after placement of a vascular stent in August, 2014.  Omeprazole was d/ced without improvement.  She was treated empirically with flagyl and bactrim without improvement. Colonoscopy has been scheduled for biopsies to r/o microscopic colitis, and for polyp surveillance.  She is admitted for inpatient prep because of difficulties controlling her blood sugars.  In the past she developed  Symptomatic hypoglycemia while prepping.       Review of Systems: She complains of lower extremity weakness.  She has nasal congestion Pertinent positive and negative review of systems were noted in the above HPI section. All other review of systems were otherwise negative.       Current Medications, Allergies, Past Medical History, Past Surgical History, Family  History and Social History were reviewed in [facility] record   Vital signs were reviewed in today's medical record. Physical Exam: General: Well developed , well nourished, no acute distress Skin: anicteric Head: Normocephalic and atraumatic Eyes:  sclerae anicteric, EOMI Ears: Normal auditory acuity Mouth: No deformity or lesions Lungs: Clear throughout to auscultation Heart: Regular rate and rhythm; no  rubs or bruits.  There is a 2/6 early systolic murmur heard loudest at the left sternal border Abdomen: Soft, non tender and non distended. No masses, hepatosplenomegaly or hernias noted. Normal Bowel sounds.  Rectal:deferred Musculoskeletal: Symmetrical with no gross deformities   Pulses:  Normal pulses noted Extremities: No clubbing, cyanosis, edema or deformities noted Neurological: Alert oriented x 4, grossly nonfocal Psychological:  Alert and cooperative. Normal mood and affect      Impression          1. Diarrhea -    6-7 month history of severe diarrhea with incontinence. Etiology not clear.    Recommendations #1 colonoscopy.  Pt is hospitalized for prep because of difficulties with glycemic control that she has experienced with previous preps.  Benign neoplasm of colon - Inda Castle, MD at 04/03/2013 11:48 AM      Status: Written Related Problem: Benign neoplasm of colon    Multiple large adenomatous polyps were removed in 2013.  Colonoscopy complicated by post-polypectomy bleeding.   Recommendations #1 patient is due for followup colonoscopy.  Plavix has been held.

## 2013-07-10 NOTE — Telephone Encounter (Signed)
Spoke with pt and let her know the hospital will contact her when she is to come in for admission for the prep. Discussed with pt that she can take her CPAP machine with her.

## 2013-07-11 ENCOUNTER — Observation Stay (HOSPITAL_COMMUNITY): Payer: Medicare Other | Admitting: Anesthesiology

## 2013-07-11 ENCOUNTER — Encounter (HOSPITAL_COMMUNITY): Payer: Medicare Other | Admitting: Anesthesiology

## 2013-07-11 ENCOUNTER — Encounter (HOSPITAL_COMMUNITY)
Admission: RE | Disposition: A | Discharge status: Home / Self Care | Payer: Self-pay | Source: Ambulatory Visit | Attending: Gastroenterology

## 2013-07-11 ENCOUNTER — Encounter (HOSPITAL_COMMUNITY): Payer: Self-pay | Admitting: *Deleted

## 2013-07-11 HISTORY — PX: COLONOSCOPY: SHX5424

## 2013-07-11 LAB — GLUCOSE, CAPILLARY
GLUCOSE-CAPILLARY: 110 mg/dL — AB (ref 70–99)
Glucose-Capillary: 105 mg/dL — ABNORMAL HIGH (ref 70–99)
Glucose-Capillary: 104 mg/dL — ABNORMAL HIGH (ref 70–99)
Glucose-Capillary: 78 mg/dL (ref 70–99)
Glucose-Capillary: 274 mg/dL — ABNORMAL HIGH (ref 70–99)

## 2013-07-11 SURGERY — COLONOSCOPY
Anesthesia: Monitor Anesthesia Care

## 2013-07-11 MED ORDER — PHENYLEPHRINE HCL 10 MG/ML IJ SOLN
INTRAMUSCULAR | Status: DC | PRN
  Administered 2013-07-11: 40 ug via INTRAVENOUS

## 2013-07-11 MED ORDER — ONDANSETRON HCL 4 MG/2ML IJ SOLN
INTRAMUSCULAR | Status: DC | PRN
  Administered 2013-07-11: 4 mg via INTRAVENOUS

## 2013-07-11 MED ORDER — MIDAZOLAM HCL 2 MG/2ML IJ SOLN
INTRAMUSCULAR | Status: AC
  Filled 2013-07-11: qty 2

## 2013-07-11 MED ORDER — FENTANYL CITRATE 0.05 MG/ML IJ SOLN
INTRAMUSCULAR | Status: DC | PRN
  Administered 2013-07-11 (×2): 50 ug via INTRAVENOUS

## 2013-07-11 MED ORDER — PROPOFOL INFUSION 10 MG/ML OPTIME
INTRAVENOUS | Status: DC | PRN
  Administered 2013-07-11: 140 ug/kg/min via INTRAVENOUS

## 2013-07-11 MED ORDER — PHENYLEPHRINE 40 MCG/ML (10ML) SYRINGE FOR IV PUSH (FOR BLOOD PRESSURE SUPPORT)
PREFILLED_SYRINGE | INTRAVENOUS | Status: AC
  Filled 2013-07-11: qty 10

## 2013-07-11 MED ORDER — LACTATED RINGERS IV SOLN
INTRAVENOUS | Status: DC | PRN
  Administered 2013-07-11: 13:00:00 via INTRAVENOUS

## 2013-07-11 MED ORDER — LIDOCAINE HCL 1 % IJ SOLN
INTRAMUSCULAR | Status: DC | PRN
  Administered 2013-07-11: 40 mg via INTRADERMAL

## 2013-07-11 MED ORDER — LACTATED RINGERS IV SOLN
INTRAVENOUS | Status: DC | PRN
  Administered 2013-07-11: 12:00:00 via INTRAVENOUS

## 2013-07-11 MED ORDER — PROPOFOL 10 MG/ML IV BOLUS
INTRAVENOUS | Status: AC
  Filled 2013-07-11: qty 20

## 2013-07-11 MED ORDER — ONDANSETRON HCL 4 MG/2ML IJ SOLN
INTRAMUSCULAR | Status: AC
  Filled 2013-07-11: qty 2

## 2013-07-11 MED ORDER — FENTANYL CITRATE 0.05 MG/ML IJ SOLN
INTRAMUSCULAR | Status: AC
  Filled 2013-07-11: qty 2

## 2013-07-11 MED ORDER — LACTATED RINGERS IV SOLN
INTRAVENOUS | Status: DC
  Administered 2013-07-11: 1000 mL via INTRAVENOUS

## 2013-07-11 MED ORDER — MIDAZOLAM HCL 5 MG/5ML IJ SOLN
INTRAMUSCULAR | Status: DC | PRN
  Administered 2013-07-11 (×2): 1 mg via INTRAVENOUS

## 2013-07-11 MED ORDER — LIDOCAINE HCL (CARDIAC) 20 MG/ML IV SOLN
INTRAVENOUS | Status: AC
  Filled 2013-07-11: qty 5

## 2013-07-11 MED ORDER — KETAMINE HCL 10 MG/ML IJ SOLN
INTRAMUSCULAR | Status: DC | PRN
  Administered 2013-07-11: 10 mg via INTRAVENOUS

## 2013-07-11 NOTE — Anesthesia Preprocedure Evaluation (Addendum)
Anesthesia Evaluation  Patient identified by MRN, date of birth, ID band Patient awake    Reviewed: Allergy & Precautions, H&P , NPO status , Patient's Chart, lab work & pertinent test results  History of Anesthesia Complications (+) PONV, Family history of anesthesia reaction and history of anesthetic complications  Airway Mallampati: III TM Distance: >3 FB Neck ROM: Full    Dental  (+) Teeth Intact, Dental Advisory Given   Pulmonary shortness of breath, sleep apnea and Continuous Positive Airway Pressure Ventilation , COPDformer smoker,  breath sounds clear to auscultation  Pulmonary exam normal       Cardiovascular hypertension, Pt. on medications and Pt. on home beta blockers + angina with exertion + CAD, + Past MI, + Peripheral Vascular Disease and +CHF Rhythm:Regular Rate:Normal  Echo 11/2012 - Left ventricle: The cavity size was normal. There was   severe concentric hypertrophy. Systolic function was   vigorous. The estimated ejection fraction was in the range  of 65% to 70%. Wall motion was normal; there were no  regional wall motion abnormalities. Doppler parameters are  consistent with restrictive ventricular relaxation (grade 3  diastolic dysfunction). The E/e' ratio is >30, suggesting  markedly elevated LV filling pressure. - Aortic valve: Sclerosis without stenosis. Mild   regurgitation. - Mitral valve: Heavily calcified posterior mitral annulus.   No significant stenosis. Trivial regurgitation. - Left atrium: At least moderately dilated. - Tricuspid valve: Mild regurgitation. 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 TIA Neuromuscular disease CVA, Residual Symptoms    GI/Hepatic Neg liver ROS, PUD, GERD-  ,  Endo/Other  diabetes, Well Controlled, Type 2, Insulin DependentHypothyroidism   Renal/GU Renal disease      Musculoskeletal   Abdominal   Peds  Hematology  (+) anemia ,   Anesthesia Other Findings   Reproductive/Obstetrics                         Anesthesia Physical  Anesthesia Plan  ASA: III  Anesthesia Plan: MAC   Post-op Pain Management:    Induction: Intravenous  Airway Management Planned: Nasal Cannula  Additional Equipment:   Intra-op Plan:   Post-operative Plan:   Informed Consent: I have reviewed the patients History and Physical, chart, labs and discussed the procedure including the risks, benefits and alternatives for the proposed anesthesia with the patient or authorized representative who has indicated his/her understanding and acceptance.     Plan Discussed with: CRNA  Anesthesia Plan Comments:        Anesthesia Quick Evaluation

## 2013-07-11 NOTE — Progress Notes (Signed)
MoviPrep completed for colonoscopy. Pt currently having clear stools with a yellowish tint.

## 2013-07-11 NOTE — Transfer of Care (Signed)
Immediate Anesthesia Transfer of Care Note  Patient: [name]  Procedure(s) Performed: Procedure(s): COLONOSCOPY (N/A)  Patient Location: PACU and Endoscopy Unit  Anesthesia Type:MAC  Level of Consciousness: oriented, sedated and patient cooperative  Airway & Oxygen Therapy: Patient Spontanous Breathing and Patient connected to face mask oxygen  Post-op Assessment: Report given to PACU RN, Post -op Vital signs reviewed and stable and Patient moving all extremities  Post vital signs: Reviewed and stable  Complications: No apparent anesthesia complications

## 2013-07-11 NOTE — Anesthesia Postprocedure Evaluation (Signed)
Anesthesia Post Note  Patient: [name]  Procedure(s) Performed: Procedure(s) (LRB): COLONOSCOPY (N/A)  Anesthesia type: MAC  Patient location: PACU  Post pain: Pain level controlled  Post assessment: Post-op Vital signs reviewed  Last Vitals: BP 136/45  Pulse 58  Temp(Src) 36.7 C (Oral)  Resp 12  Ht 5\' 2"  (1.575 m)  Wt 191 lb (86.637 kg)  BMI 34.93 kg/m2  SpO2 100%  Post vital signs: Reviewed  Level of consciousness: awake  Complications: No apparent anesthesia complications

## 2013-07-11 NOTE — Interval H&P Note (Signed)
History and Physical Interval Note:  07/11/2013 12:53 PM  [name]  has presented today for surgery, with the diagnosis of Abdominal pain [789.00]  The various methods of treatment have been discussed with the patient and family. After consideration of risks, benefits and other options for treatment, the patient has consented to  Procedure(s): COLONOSCOPY (N/A) as a surgical intervention .  The patient's history has been reviewed, patient examined, no change in status, stable for surgery.  I have reviewed the patient's chart and labs.  Questions were answered to the patient's satisfaction.     The recent H&P (dated **07/10/13*) was reviewed, the patient was examined and there is no change in the patients condition since that H&P was completed.   [name]  07/11/2013, 12:53 PM   [name]

## 2013-07-12 ENCOUNTER — Encounter (HOSPITAL_COMMUNITY): Payer: Self-pay | Admitting: Gastroenterology

## 2013-07-12 LAB — GLUCOSE, CAPILLARY
GLUCOSE-CAPILLARY: 168 mg/dL — AB (ref 70–99)
GLUCOSE-CAPILLARY: 223 mg/dL — AB (ref 70–99)

## 2013-07-12 MED ORDER — DIPHENOXYLATE-ATROPINE 2.5-0.025 MG PO TABS: 1.0000 | ORAL_TABLET | Freq: Two times a day (BID) | ORAL | Status: AC | PRN

## 2013-07-12 NOTE — Progress Notes (Signed)
Dc instructions carefully reviewed with patient.  Home med rec reviewed as well.  No changes noted since am assessment.  Rx given.  Patient has 2 f/u in place.  Patient to call GI for further questions.  Patient denies further questions or concerns at this time.  Patient to be discharged to home. NT escorted patient to ICU to visit her daughter

## 2013-07-12 NOTE — Discharge Summary (Signed)
[facility] Discharge Summary  Name: [name] MRN: [id number] DOB: 08/13/1945 68 y.o. PCP:  Lanette Hampshire, MD  Date of Admission: 07/10/2013  2:36 PM Date of Discharge: 07/12/2013 Primary Gastroenterologist: [name], MD Discharging Physician: [name], Md  Discharge Diagnosis: 1. Diarrhea. Workup in progress. Normal colonoscopy, random biopsies pending 2. Diabetes. Blood glucose stable overnight on sliding scale insulin 3. Multiple medical problems. No acute issues during observational stay.   Consultations: none  GI Procedures: colonoscopy  History/Physical Exam:  See Admission H&P  Admission HPI: Per D. Deatra Ina  68 year old white female with multiple medical problems including diabetes, history of strep endocarditis, pulmonary hypertension, hypothyroidism, coronary artery disease, congestive heart failure, obstructive sleep apnea, history of CVA, colonic polyposis referred for evaluation of diarrhea and incontinence. Over the past 6-64months she's developed a diarrhea consisting of postprandial diarrhea at least 3-4 times a day and sometimes awakening her. Stools are watery and accompanied by severe urgency. She occasionally has incontinence. In addition, she may pass small amounts of stool when she has to pass gas. Prior to this she was having solid stools. Omeprazole started about 5 months ago. There have been no other medication changes preceding diarrhea. She has not taken antibiotics. In may, 2013 multiple sessile colon polyps were removed. She developed recurrent post polypectomy bleeding requiring therapeutic intervention x3. She has had no recurrences of lower GI bleeding. She is on Plavix after placement of a vascular stent in August, 2014. Omeprazole was d/ced without improvement. She was treated empirically with flagyl and bactrim without improvement. Colonoscopy has been scheduled for biopsies to r/o microscopic colitis, and for polyp surveillance.  She is admitted for inpatient prep because of difficulties controlling her blood sugars. In the past she developed Symptomatic hypoglycemia while prepping.  Hospital Course by problem list:  1. Diarrhea. Patient was admitted for colonoscopy for further evaluations. She was unable to safely prep at home given labile blood glucose levels. On admission she was placed on a sliding scale insulin regimen, blood sugars were monitored closely.Bowel prep was successful and the following morning patient underwent colonoscopy with random biopsies.Colonoscopy was normal, random biopsies were pending at time of discharge. Patient would follow up with Dr. Deatra Ina in the office. We would call her in the interim with biopsy results.    2. Diabetes. Patient was managed with sliding scale insulin. No incidences of hypoglycemia this admission. Blood sugars were fairly well controlled.   3. Multiple medical problems. No acute issues.    Discharge Vitals:  BP 146/64  Pulse 61  Temp(Src) 98.5 F (36.9 C) (Oral)  Resp 20  Ht 5\' 2"  (1.575 m)  Wt 191 lb (86.637 kg)  BMI 34.93 kg/m2  SpO2 96%  Physical Exam General: pleasant white female in NAD Cardiac: RRR Pulmonary: Lungs CTA bilaterally Abdominal: Soft, nondistended, nontender, active bowel sounds Extremity: no edema  Neuro: alert, oriented Psych: alert and oriented  Discharge Labs:  Results for orders placed during the hospital encounter of 07/10/13 (from the past 24 hour(s))  GLUCOSE, CAPILLARY     Status: Abnormal   Collection Time    07/11/13 11:14 AM      Result Value Ref Range   Glucose-Capillary 104 (*) 70 - 99 mg/dL   Comment 1 Notify RN    GLUCOSE, CAPILLARY     Status: None   Collection Time    07/11/13  3:16 PM      Result Value Ref Range   Glucose-Capillary 78  70 - 99  mg/dL   Comment 1 Notify RN    GLUCOSE, CAPILLARY     Status: Abnormal   Collection Time    07/11/13  4:21 PM      Result Value Ref Range   Glucose-Capillary 110 (*)  70 - 99 mg/dL   Comment 1 Notify RN    GLUCOSE, CAPILLARY     Status: Abnormal   Collection Time    07/11/13  9:28 PM      Result Value Ref Range   Glucose-Capillary 274 (*) 70 - 99 mg/dL   Comment 1 Notify RN    GLUCOSE, CAPILLARY     Status: Abnormal   Collection Time    07/12/13  7:34 AM      Result Value Ref Range   Glucose-Capillary 168 (*) 70 - 99 mg/dL   Comment 1 Notify RN      Disposition and follow-up:   [name] was discharged from [facility] in stable condition.    Follow-up Appointments: Discharge Orders   Future Appointments Provider Department Dept Phone   08/02/2013 9:45 AM Inda Castle, MD [facility] [phone]   08/14/2013 3:00 PM Philmore Pali, NP [facility] [phone]   Future Orders Complete By Expires   Diet - low sodium heart healthy  As directed    Discontinue IV  As directed    Increase activity slowly  As directed       Discharge Medications:   Medication List         ALPRAZolam 1 MG 24 hr tablet  Commonly known as:  XANAX XR  Take 1 mg by mouth at bedtime. Anxiety     aspirin EC 81 MG tablet  Take 81 mg by mouth every morning.     B-D ULTRAFINE III SHORT PEN 31G X 8 MM Misc  Generic drug:  Insulin Pen Needle     Biotin 5000 MCG Caps  Take 5,000 mcg by mouth daily.     cloNIDine 0.3 mg/24hr patch  Commonly known as:  CATAPRES - Dosed in mg/24 hr  Place 1 patch onto the skin every Tuesday. Change on Tuesdays     cloNIDine 0.1 MG tablet  Commonly known as:  CATAPRES  Take 0.1 mg by mouth daily as needed (when BP is over 170).     clopidogrel 75 MG tablet  Commonly known as:  PLAVIX  Take 75 mg by mouth daily with breakfast.     Co Q-10 50 MG Caps  Take 50 mg by mouth daily.     diphenoxylate-atropine 2.5-0.025 MG per tablet  Commonly known as:  LOMOTIL  Take 1 tablet by mouth 2 (two) times daily as needed for diarrhea or loose stools.     escitalopram 20 MG  tablet  Commonly known as:  LEXAPRO  Take 20 mg by mouth 2 (two) times daily.     FISH OIL TRIPLE STRENGTH PO  Take 2 capsules by mouth 2 (two) times daily.     furosemide 40 MG tablet  Commonly known as:  LASIX  Take 40 mg by mouth 2 (two) times daily.     hydrALAZINE 100 MG tablet  Commonly known as:  APRESOLINE  Take 100 mg by mouth 3 (three) times daily.     insulin aspart 100 UNIT/ML injection  Commonly known as:  novoLOG  Inject 28-32 Units into the skin 3 (three) times daily with meals. Takes 28 units with breakfast, 28 units with lunch, and 32  units with supper     insulin glargine 100 UNIT/ML injection  Commonly known as:  LANTUS  Inject 60 Units into the skin at bedtime.     irbesartan 300 MG tablet  Commonly known as:  AVAPRO  Take 300 mg by mouth at bedtime.     isosorbide mononitrate 60 MG 24 hr tablet  Commonly known as:  IMDUR  Take 60 mg by mouth at bedtime.     levothyroxine 112 MCG tablet  Commonly known as:  SYNTHROID, LEVOTHROID  Take 112 mcg by mouth every morning.     nebivolol 5 MG tablet  Commonly known as:  BYSTOLIC  Take 5 mg by mouth every morning.     niacin 500 MG CR tablet  Commonly known as:  NIASPAN  Take 500 mg by mouth 2 (two) times daily.     potassium chloride SA 20 MEQ tablet  Commonly known as:  K-DUR,KLOR-CON  Take 20 mEq by mouth daily after breakfast.     SUPER B COMPLEX PO  Take 1 tablet by mouth daily.     Vitamin D 2000 UNITS tablet  Take 2,000 Units by mouth every evening.        Signed: Tye Savoy 07/12/2013, 9:46 AM

## 2013-07-16 NOTE — Op Note (Signed)
[facility] [place], [id number]   COLONOSCOPY PROCEDURE REPORT  PATIENT: Anita, [name]  MR#: [id number] BIRTHDATE: 30-Jul-1945 , 84  yrs. old GENDER: Female ENDOSCOPIST: Inda Castle, MD REFERRED GY:BWLSL Everette Rank, M.D. PROCEDURE DATE:  07/11/2013 PROCEDURE:   Colonoscopy with biopsy ASA CLASS:   Class III INDICATIONS:Unexplained diarrhea and Patient's personal history of adenomatous colon polyps. MEDICATIONS: MAC sedation, administered by CRNA  DESCRIPTION OF PROCEDURE:   After the risks benefits and alternatives of the procedure were thoroughly explained, informed consent was obtained.  A digital rectal exam revealed no abnormalities of the rectum.   The EC-[id number] ([id number])  endoscope was introduced through the anus and advanced to the cecum, which was identified by both the appendix and ileocecal valve. No adverse events experienced.   The quality of the prep was good, using MiraLax  The instrument was then slowly withdrawn as the colon was fully examined.      COLON FINDINGS: A normal appearing cecum, ileocecal valve, and appendiceal orifice were identified. (no photo available of the ileocecal valve). The ascending, hepatic flexure, transverse, splenic flexure, descending, sigmoid colon and rectum appeared unremarkable.  No polyps or cancers were seen.  Multiple biopies were taken to r/o microscopic colitis.    Retroflexed view of the rectum was normal.  The time to cecum=  .  Withdrawal time=7 minutes 0 seconds.  The scope was withdrawn and the procedure completed. COMPLICATIONS: There were no complications.  ENDOSCOPIC IMPRESSION: Normal colon  RECOMMENDATIONS: 1.  Await biopsy results 2.  Colonoscopy 5 years   eSigned:  Inda Castle, MD 07/11/2013 1:47 PM   cc:

## 2013-07-23 ENCOUNTER — Telehealth: Payer: Self-pay | Admitting: *Deleted

## 2013-07-23 NOTE — Telephone Encounter (Signed)
Faxed signed CPAP order supply to [facility].

## 2013-08-02 ENCOUNTER — Encounter: Payer: Self-pay | Admitting: Gastroenterology

## 2013-08-02 ENCOUNTER — Ambulatory Visit (INDEPENDENT_AMBULATORY_CARE_PROVIDER_SITE_OTHER): Payer: Medicare Other | Admitting: Gastroenterology

## 2013-08-02 VITALS — BP 150/56 | HR 60 | Ht 64.0 in | Wt 198.4 lb

## 2013-08-02 DIAGNOSIS — I701 Atherosclerosis of renal artery: Secondary | ICD-10-CM

## 2013-08-02 DIAGNOSIS — Z860100 Personal history of colon polyps, unspecified: Secondary | ICD-10-CM

## 2013-08-02 DIAGNOSIS — R197 Diarrhea, unspecified: Secondary | ICD-10-CM

## 2013-08-02 NOTE — Progress Notes (Signed)
          History of Present Illness:  The patient has returned following colonoscopy.  Random biopsies were unremarkable.  No polyps were seen.  Since her procedure she has been suffering from constipation.  She may go several days without a spontaneous bowel movement.  She has had to use prune juice.  She has the urge to move her bowels but is unable to evacuate.  There has been no change in diet or medications.    Review of Systems: Pertinent positive and negative review of systems were noted in the above HPI section. All other review of systems were otherwise negative.    Current Medications, Allergies, Past Medical History, Past Surgical History, Family History and Social History were reviewed in [place] record  Vital signs were reviewed in today's medical record. Physical Exam: General: Well developed , well nourished, no acute distress   See Assessment and Plan under Problem List

## 2013-08-02 NOTE — Patient Instructions (Signed)
Follow up as needed

## 2013-08-02 NOTE — Assessment & Plan Note (Signed)
Plan followup colonoscopy 2020

## 2013-08-02 NOTE — Assessment & Plan Note (Signed)
What was an incapacitating problem for several months has spontaneously resolved.  Etiology was not determined.  At this point she is having hard stools and complains of constipation.  Recommendations #1 fiber supplementation daily

## 2013-08-06 ENCOUNTER — Encounter (HOSPITAL_COMMUNITY): Payer: Self-pay | Admitting: *Deleted

## 2013-08-06 ENCOUNTER — Other Ambulatory Visit (HOSPITAL_COMMUNITY): Payer: Self-pay | Admitting: Cardiovascular Disease

## 2013-08-06 DIAGNOSIS — I739 Peripheral vascular disease, unspecified: Secondary | ICD-10-CM

## 2013-08-14 ENCOUNTER — Encounter: Payer: Self-pay | Admitting: Nurse Practitioner

## 2013-08-14 ENCOUNTER — Ambulatory Visit (INDEPENDENT_AMBULATORY_CARE_PROVIDER_SITE_OTHER): Payer: Medicare Other | Admitting: Nurse Practitioner

## 2013-08-14 VITALS — BP 127/53 | HR 66 | Ht 65.0 in | Wt 195.0 lb

## 2013-08-14 DIAGNOSIS — R413 Other amnesia: Secondary | ICD-10-CM

## 2013-08-14 DIAGNOSIS — I6789 Other cerebrovascular disease: Secondary | ICD-10-CM

## 2013-08-14 DIAGNOSIS — I701 Atherosclerosis of renal artery: Secondary | ICD-10-CM

## 2013-08-14 NOTE — Patient Instructions (Signed)
PLAN:  Continue Plavix for stroke prevention with strict control of hypertension with blood pressure goal below 120/80, diabetes with 1 A1c goal below 6.5% and lipids with LDL cholesterol goal below 70 mg percent.  Return for followup in 6 months with NP or call earlier if necessary.

## 2013-08-14 NOTE — Progress Notes (Signed)
PATIENT: [name] DOB: [birth year]  REASON FOR VISIT: follow up for stroke, memory HISTORY FROM: patient  HISTORY OF PRESENT ILLNESS: 08/14/13 (LL):  She returns for follow up visit after last visit 3 Months ago.  She reports improvement in her mood and she thinks memory is stable.  She states her blood pressure, diabetes and cholesterol are much better.  BP in office today is 127/53.  She gets vascular ultrasound every 6 months through Dr. Kennon Holter office.  She denies any new neurovascular symptoms but endorses back pain.  She is tolerating Plavix without significant bleeding or bruising.  05/13/13 (PS): She returns for followup today after last visit 2 months ago. She states she's noticed slight improvement in her memory and depression after increasing the Lexapro to 40 mg daily which she seemed to be tolerating quite well. She states the ears are blocked and hurt and she has trouble hearing and understanding because of this. She forgot to do the lab work which I requested at last visit but is willing to do them today. She states her blood pressure much better and primary care physician seems to be happy with it though it is slightly elevated in office today at 153/53. She had EEG done on 03/05/13 which was normal and transcranial Doppler studies on 03/05/13 which showed mild proximal basilar stenosis and generalized increase in pulsatility indexes suggestive of age-related atherosclerosis. Carotid ultrasound showed mild 50-69% right ICA stenosis and mild plaques bilaterally.   Initial Consult 02/25/13 (PS):  24 year lady with memory diffficulties following a episode of headache, confusion and headache 2 months ago.She states she went to [facility] and was told she had a stroke but MRI scan 11/29/2012 reviewed by me shows no acute infarct but old bilateral cerebellar infarcts.She reports short term memory loss mainly and difficulty with recent events and retaining new information.She  denies current headaches and confusion.She has h/o 2 strokes in last 5 years with mild residual right hand weakness, diminished fine motor skills and numbness.She had B12, TSHand RPR checked on 12/05/12 which were fine. She had EEG 12/05/12 which was also fine.  2DEcho was fine.  She has h/o depression for which she takes Lexapro 20 mg daily and feels it is well controlled. She has no h/o seizures, head injury or family h/o dementia.   ROS:  14 system review of systems is positive for fatigue, hearing loss, ear pain, ringing in the ears, cold intolerance, excessive thirst, diarrhea, chest tightness, chest pain, insomnia, apnea, daytime sleepiness, memory loss, dizziness, numbness, anxiety, back pain and walking difficulty. All other systems negative.  ALLERGIES: Allergies  Allergen Reactions  . Codeine Anaphylaxis    swelling  . Lidocaine Anaphylaxis  . Amlodipine   . Atenolol Other (See Comments)    Unknown  . Bactrim [Sulfamethoxazole-Tmp Ds] Nausea Only  . Bystolic [Nebivolol Hcl]     Cannot tolerate greater then 5 mg, causes chest pain  . Ciprofloxacin Itching and Nausea And Vomiting  . Levothyroxine     Patient reports allergy to generic products of levothyroxine, must have brand  . Metformin Nausea And Vomiting  . Metoprolol Other (See Comments)    Burns ears  . Morphine And Related Other (See Comments)    Makes patient feel like she is floating  . Penicillins Rash    HOME MEDICATIONS: Outpatient Prescriptions Prior to Visit  Medication Sig Dispense Refill  . ALPRAZolam (XANAX XR) 1 MG 24 hr tablet Take 1 mg by  mouth at bedtime. Anxiety      . aspirin EC 81 MG tablet Take 81 mg by mouth every morning.       . B Complex-C (SUPER B COMPLEX PO) Take 1 tablet by mouth daily.       . B-D ULTRAFINE III SHORT PEN 31G X 8 MM MISC       . Biotin 5000 MCG CAPS Take 5,000 mcg by mouth daily.       . Cholecalciferol (VITAMIN D) 2000 UNITS tablet Take 2,000 Units by mouth every evening.        . cloNIDine (CATAPRES - DOSED IN MG/24 HR) 0.3 mg/24hr Place 1 patch onto the skin every Tuesday. Change on Tuesdays      . cloNIDine (CATAPRES) 0.1 MG tablet Take 0.1 mg by mouth daily as needed (when BP is over 170).      . clopidogrel (PLAVIX) 75 MG tablet Take 75 mg by mouth daily with breakfast.      . Coenzyme Q10 (CO Q-10) 50 MG CAPS Take 50 mg by mouth daily.      . diphenoxylate-atropine (LOMOTIL) 2.5-0.025 MG per tablet Take 1 tablet by mouth 2 (two) times daily as needed for diarrhea or loose stools.  40 tablet  0  . escitalopram (LEXAPRO) 20 MG tablet Take 20 mg by mouth 2 (two) times daily.       . furosemide (LASIX) 40 MG tablet Take 40 mg by mouth 2 (two) times daily.       . hydrALAZINE (APRESOLINE) 100 MG tablet Take 100 mg by mouth 3 (three) times daily.      . insulin aspart (NOVOLOG) 100 UNIT/ML injection Inject 28-32 Units into the skin 3 (three) times daily with meals. Takes 28 units with breakfast, 28 units with lunch, and 32 units with supper      . insulin glargine (LANTUS) 100 UNIT/ML injection Inject 60 Units into the skin at bedtime.       . irbesartan (AVAPRO) 300 MG tablet Take 300 mg by mouth at bedtime.       . isosorbide mononitrate (IMDUR) 60 MG 24 hr tablet Take 60 mg by mouth at bedtime.      Marland Kitchen levothyroxine (SYNTHROID, LEVOTHROID) 112 MCG tablet Take 112 mcg by mouth every morning.       . nebivolol (BYSTOLIC) 5 MG tablet Take 5 mg by mouth every morning.      . niacin (NIASPAN) 500 MG CR tablet Take 500 mg by mouth 2 (two) times daily.       . Omega-3 Fatty Acids (FISH OIL TRIPLE STRENGTH PO) Take 2 capsules by mouth 2 (two) times daily.       . potassium chloride SA (K-DUR,KLOR-CON) 20 MEQ tablet Take 20 mEq by mouth daily after breakfast.        No facility-administered medications prior to visit.     PHYSICAL EXAM  Filed Vitals:   08/14/13 1442  BP: 127/53  Pulse: 66  Height: 5\' 5"  (1.651 m)  Weight: 195 lb (88.451 kg)   Body mass index is  32.45 kg/(m^2).  Physical Exam  General: obese middle aged lady, seated, in no evident distress  Head: head normocephalic and atraumatic. Orohparynx benign  Neck: supple with soft carotid bruits  Cardiovascular: regular rate and rhythm, no murmurs  Musculoskeletal: no deformity  Skin: no rash/petichiae  Vascular: Distal LE pulses not felt   Neurologic Exam  Mental Status: Awake and fully alert. Oriented to place and  time. MMSE 24/30 with deficits in recall and attention. (Last MMSE 25/30). Fund of knowledge appropriate. Mood and affect but Geriatric depression scale 7 suggestive of mild depression.  Cranial Nerves: Pupils equal, briskly reactive to light. Extraocular movements full without nystagmus. Visual fields full to confrontation. Hearing diminished on right. Facial sensation intact. Face, tongue, palate moves normally and symmetrically.  Motor: Normal bulk and tone. Normal strength in all tested extremity muscles.diminished fine finger movements on right and orbits left over right upper extremity.  Sensory: intact to touch and pinprick and Diminished vibratory sensation bilateral toes.  Coordination: Rapid alternating movements normal in all extremities. Finger-to-nose and heel-to-shin performed accurately bilaterally.  Gait and Station: Arises from chair without difficulty. Stance is normal. Gait demonstrates normal stride length and balance. Not able to heel, toe and tandem walk without difficulty. Romberg negative. Reflexes: 1+ and symmetric except ankle jerks are depressed.   ASSESSMENT AND PLAN 42 year lady with Mild memory difficulties likely from suboptimally treated depression. Remote h/o bilateral cerebellar strokes.   PLAN:  Continue Plavix for stroke prevention with strict control of hypertension with blood pressure goal below 120/80, diabetes with 1 A1c goal below 6.5% and lipids with LDL cholesterol goal below 70 mg percent.  She has repeat carotid doppler study scheduled  in April with Dr. Gwenlyn Found. Return for followup in 6 months with NP or call earlier if necessary.   Philmore Pali, MSN, NP-C 08/14/2013, 3:00 PM [facility] [address] [place], [place] [address] [phone]  Note: This document was prepared with digital dictation and possible smart phrase technology. Any transcriptional errors that result from this process are unintentional.

## 2013-08-16 ENCOUNTER — Telehealth: Payer: Self-pay | Admitting: Cardiovascular Disease

## 2013-08-16 NOTE — Telephone Encounter (Signed)
She is taking 75mg  Plavix and 81mg  aspirin.  Should she continue both.  Her DR in hospital was dr Leonie Man.  Please call.

## 2013-08-16 NOTE — Telephone Encounter (Signed)
I spoke with patient and advised to continue both medications.  No changes were made at there last office visit.  Patient verbalized understanding.

## 2013-08-16 NOTE — Telephone Encounter (Signed)
Forward to Dr Collie Siad RN  Will defer to primary  cardiolgist

## 2013-08-29 ENCOUNTER — Other Ambulatory Visit: Payer: Self-pay | Admitting: *Deleted

## 2013-08-29 MED ORDER — HYDRALAZINE HCL 100 MG PO TABS: 100.0000 mg | ORAL_TABLET | Freq: Three times a day (TID) | ORAL | Status: DC

## 2013-08-29 MED ORDER — CLOPIDOGREL BISULFATE 75 MG PO TABS: 75.0000 mg | ORAL_TABLET | Freq: Every day | ORAL | Status: DC

## 2013-08-29 MED ORDER — POTASSIUM CHLORIDE CRYS ER 20 MEQ PO TBCR: 20.0000 meq | EXTENDED_RELEASE_TABLET | Freq: Every day | ORAL | Status: DC

## 2013-08-29 MED ORDER — ISOSORBIDE MONONITRATE ER 60 MG PO TB24: 60.0000 mg | ORAL_TABLET | Freq: Every day | ORAL | Status: DC

## 2013-08-29 NOTE — Telephone Encounter (Signed)
Rx was sent to pharmacy electronically. 

## 2013-08-30 ENCOUNTER — Other Ambulatory Visit: Payer: Self-pay

## 2013-08-30 MED ORDER — ISOSORBIDE MONONITRATE ER 60 MG PO TB24: 60.0000 mg | ORAL_TABLET | Freq: Every day | ORAL | Status: AC

## 2013-08-30 NOTE — Telephone Encounter (Signed)
Rx was sent to pharmacy electronically. 

## 2013-09-02 ENCOUNTER — Telehealth: Payer: Self-pay | Admitting: *Deleted

## 2013-09-02 NOTE — Telephone Encounter (Signed)
Stop ASA for now.  If still bleeding after these interventions, then needs to go to ER for possible packing or cautery.  Leonie Man, MD

## 2013-09-02 NOTE — Telephone Encounter (Signed)
Returned call and pt verified x 2.  Pt stated she is on Plavix and ASA since she had her stroke.  Stated she stopped both 3 days ago and the bleeding still hasn't stopped.  RN asked what pt tried to stop bleeding.  Pt stated she tried pinching nose, lying down and stopping Plavix ASA.  Advice  Do NOT stop antiplatelet meds w/o doctor's order or permission as she puts herself at risk for clots  Pinch bridge of nose and hold head forward to prevent swallowing blood  Apply ice pack to bridge of nose to help constrict blood vessels  Once bleeding stops, do not blow nose or pull scabs in nose for at least 2 hours  Line nasal mucosa w/ thin layer of petroleum jelly to prevent drying and cracking  RN will notify provider as Dr. Gwenlyn Found is out of the office today  Pt verbalized understanding and agreed w/ plan.  Message forwarded to DOD (Dr. Ellyn Hack) to review and advise, as primary cardiologist is out of the office.

## 2013-09-02 NOTE — Telephone Encounter (Signed)
Pt was calling because she is on Plavix and a baby aspirin and her nose has bled for 3 days and needs to know what to do.   JB

## 2013-09-02 NOTE — Telephone Encounter (Signed)
Returned call and informed pt per instructions by MD/PA.  Pt verbalized understanding and agreed w/ plan.   Stated the bleeding actually stopped w/ the advice RN gave.  Pt will hold ASA for now and continue Plavix.

## 2013-09-03 ENCOUNTER — Encounter (HOSPITAL_COMMUNITY): Payer: Self-pay | Admitting: Emergency Medicine

## 2013-09-03 ENCOUNTER — Emergency Department (HOSPITAL_COMMUNITY)
Admission: EM | Admit: 2013-09-03 | Discharge: 2013-09-03 | Disposition: A | Discharge status: Home / Self Care | Payer: Medicare Other | Attending: Emergency Medicine | Admitting: Emergency Medicine

## 2013-09-03 ENCOUNTER — Telehealth: Payer: Self-pay | Admitting: *Deleted

## 2013-09-03 DIAGNOSIS — F411 Generalized anxiety disorder: Secondary | ICD-10-CM | POA: Insufficient documentation

## 2013-09-03 DIAGNOSIS — Z87448 Personal history of other diseases of urinary system: Secondary | ICD-10-CM | POA: Insufficient documentation

## 2013-09-03 DIAGNOSIS — Z9981 Dependence on supplemental oxygen: Secondary | ICD-10-CM | POA: Insufficient documentation

## 2013-09-03 DIAGNOSIS — Z86718 Personal history of other venous thrombosis and embolism: Secondary | ICD-10-CM | POA: Insufficient documentation

## 2013-09-03 DIAGNOSIS — F3289 Other specified depressive episodes: Secondary | ICD-10-CM | POA: Insufficient documentation

## 2013-09-03 DIAGNOSIS — Z8673 Personal history of transient ischemic attack (TIA), and cerebral infarction without residual deficits: Secondary | ICD-10-CM | POA: Insufficient documentation

## 2013-09-03 DIAGNOSIS — Z7902 Long term (current) use of antithrombotics/antiplatelets: Secondary | ICD-10-CM | POA: Insufficient documentation

## 2013-09-03 DIAGNOSIS — R040 Epistaxis: Secondary | ICD-10-CM | POA: Insufficient documentation

## 2013-09-03 DIAGNOSIS — Z7982 Long term (current) use of aspirin: Secondary | ICD-10-CM | POA: Insufficient documentation

## 2013-09-03 DIAGNOSIS — Z8719 Personal history of other diseases of the digestive system: Secondary | ICD-10-CM | POA: Insufficient documentation

## 2013-09-03 DIAGNOSIS — M129 Arthropathy, unspecified: Secondary | ICD-10-CM | POA: Insufficient documentation

## 2013-09-03 DIAGNOSIS — Z792 Long term (current) use of antibiotics: Secondary | ICD-10-CM | POA: Insufficient documentation

## 2013-09-03 DIAGNOSIS — Z880 Allergy status to penicillin: Secondary | ICD-10-CM | POA: Insufficient documentation

## 2013-09-03 DIAGNOSIS — I252 Old myocardial infarction: Secondary | ICD-10-CM | POA: Insufficient documentation

## 2013-09-03 DIAGNOSIS — E039 Hypothyroidism, unspecified: Secondary | ICD-10-CM | POA: Insufficient documentation

## 2013-09-03 DIAGNOSIS — I2510 Atherosclerotic heart disease of native coronary artery without angina pectoris: Secondary | ICD-10-CM | POA: Insufficient documentation

## 2013-09-03 DIAGNOSIS — I509 Heart failure, unspecified: Secondary | ICD-10-CM | POA: Insufficient documentation

## 2013-09-03 DIAGNOSIS — Z9889 Other specified postprocedural states: Secondary | ICD-10-CM | POA: Insufficient documentation

## 2013-09-03 DIAGNOSIS — I10 Essential (primary) hypertension: Secondary | ICD-10-CM | POA: Insufficient documentation

## 2013-09-03 DIAGNOSIS — Z794 Long term (current) use of insulin: Secondary | ICD-10-CM | POA: Insufficient documentation

## 2013-09-03 DIAGNOSIS — Z87891 Personal history of nicotine dependence: Secondary | ICD-10-CM | POA: Insufficient documentation

## 2013-09-03 DIAGNOSIS — E119 Type 2 diabetes mellitus without complications: Secondary | ICD-10-CM | POA: Insufficient documentation

## 2013-09-03 DIAGNOSIS — F329 Major depressive disorder, single episode, unspecified: Secondary | ICD-10-CM | POA: Insufficient documentation

## 2013-09-03 DIAGNOSIS — G4733 Obstructive sleep apnea (adult) (pediatric): Secondary | ICD-10-CM | POA: Insufficient documentation

## 2013-09-03 DIAGNOSIS — Z79899 Other long term (current) drug therapy: Secondary | ICD-10-CM | POA: Insufficient documentation

## 2013-09-03 NOTE — Telephone Encounter (Signed)
Returned call and pt verified x 2.  Pt informed message received.  Pt stated it stopped bleeding for a few minutes yesterday and then started back up.  Stated she was up all night with it.  Pt reminded this RN advised she go to ER for evaluation for packing or cauterization, unless she is established w/ an ENT already.  Pt verbalized understanding and agreed w/ plan.  Pt denied home treatment is working anymore.

## 2013-09-03 NOTE — Discharge Instructions (Signed)
Follow up with Dr. Benjamine Mola (ENT) as needed. Follow up with your doctor for further evaluation.

## 2013-09-03 NOTE — ED Provider Notes (Signed)
CSN: [id number]     Arrival date & time 09/03/13  1335 History  This chart was scribed for non-physician practitioner Alvina Chou, PA-C working with Shaune Pollack, MD by Ludger Nutting, ED Scribe. This patient was seen in room TR04C/TR04C and the patient's care was started at 3:12 PM.    Chief Complaint  Patient presents with  . Epistaxis      The history is provided by the patient. No language interpreter was used.    HPI Comments: [name] is a 68 y.o. female who presents to the Emergency Department complaining of 3 days of constant epistaxis that resolved while in the waiting room today. Patient states she takes Plavix. She denies any pain or any other symptoms at this time.    PCP [facility]  Past Medical History  Diagnosis Date  . HTN (hypertension)   . Elevated troponin level 09/28/11    Type II MI - not ACS related  . Diastolic dysfunction, left ventricle     with previous Diastolic HF  . Diabetes mellitus   . High cholesterol   . Kidney disease   . Cataracts, bilateral   . 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 Pulmonary HTN-severe- due to diastolic dysfunction 07/01/9240    Moderate to Severe Pulm HTN.  Marland Kitchen Acute renal failure 09/29/2011  . 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 HYPOTHYROIDISM 06/19/2008    Qualifier: Diagnosis of  By: Burt Knack CMA, Cecille Rubin    . Renal artery stenosis     s/p stents (restenosis)  . Subclavian arterial stenosis   . Peripheral neuropathy   . Coronary artery disease   . Myocardial infarction   . Pericarditis   . Anxiety   . Depression   . Shortness of breath   . CHF (congestive heart failure)   . GERD (gastroesophageal reflux disease)   . Obstructive sleep apnea on nocturnal CPAP 06/20/2008    AHI-1.0/hr, AHI REM-7.5/hr  . Unstable angina   . Stroke   . Arthritis   . Vertigo   . Complication of anesthesia   . PONV (postoperative nausea and vomiting)    . Family history of anesthesia complication     MOTHER ALSO HAD NAUSEA  . Blood clot in abdominal vein 01/09/2013  . History of nuclear stress test 10/10/2011    lexiscan; no perfusion defects    Past Surgical History  Procedure Laterality Date  . Cardiac catheterization  01/2011    Minimal luminal irregularties  . Laparoscopic tubal ligation    . Renal artery stent Bilateral     x 2 (restenosis intervention with iCAST covered stents)  . Colonoscopy  10/12/2011    Procedure: COLONOSCOPY;  Surgeon: Inda Castle, MD;  Location: [facility];  Service: Endoscopy;  Laterality: N/A;  . Esophagogastroduodenoscopy  10/12/2011    Procedure: ESOPHAGOGASTRODUODENOSCOPY (EGD);  Surgeon: Inda Castle, MD;  Location: [facility];  Service: Endoscopy;  Laterality: N/A;  . Tubal ligation    . Colonoscopy  10/21/2011    Procedure: COLONOSCOPY;  Surgeon: Irene Shipper, MD;  Location: [place];  Service: Endoscopy;  Laterality: N/A;  . Colonoscopy  10/25/2011    Procedure: COLONOSCOPY;  Surgeon: Jerene Bears, MD;  Location: [facility];  Service: Gastroenterology;  Laterality: N/A;  . Colonoscopy  10/23/2011    Procedure: COLONOSCOPY;  Surgeon: Irene Shipper, MD;  Location: [place];  Service: Endoscopy;  Laterality: N/A;  . Colon surgery    .  Back surgery       x 2  . Cataract extraction w/phaco  03/29/2012    Procedure: CATARACT EXTRACTION PHACO AND INTRAOCULAR LENS PLACEMENT (IOC);  Surgeon: Tonny Branch, MD;  Location: [facility];  Service: Ophthalmology;  Laterality: Right;  CDE: 16.87  . Cataract extraction w/phaco  04/16/2012    Procedure: CATARACT EXTRACTION PHACO AND INTRAOCULAR LENS PLACEMENT (IOC);  Surgeon: Tonny Branch, MD;  Location: [facility];  Service: Ophthalmology;  Laterality: Left;  CDE:  14.98  . Transthoracic echocardiogram  12/16/2011    EF 55-60%; mod LVH & mod conc LVH, grade 2 diastolic dysfunction; mild MR; mod-severely calcfied MR annulus; LA mildly dilated; RV systolic pressure  increased; mild TR  . Renal doppler  01/18/2010    Abdominal Aorta-50-69% diameter reduction, L Proximal Renal Artery stent-60% diameter reduction-no visible narrowing noted.  . Cardiac catheterization  11/25/2005    Normal coronary arteries and LV systolic function. There is a 90% in-stent restenosis diffusely.  . Cardiac catheterization  09/04/2007    LAD had a 50% segmental proximal stenosis.  . Renal angiogram Bilateral 09/04/2007    L Renal artery 95% ostial stenosis, stented w/ a 6x12 Genesis stent at 12atm, R Renal artery 95% ostial stenosis, stented w/ a 6x12 Genesis stent. Resulting in reduction of bilateral 95% ostial renal artery stenosis to 0% residual with excellent results.  . Renal angiogram Bilateral 05/06/2009    L Renal artery 80& in-stent restenosis stented w/ a 5x16iCAST PTFE-covered stent at 11-12atm, resulting in reduction of 80% stenosis to 0% residual. R Renal artery 90% stenosis stented w/ a 6x16 [employer]rium iCAST PTFE-covered stent at 12atm resulting in reduction of 90% stenosis to 0% residual.  . Lexiscan myoview  04/15/2008    No significant ischemia demonstrated  . Transthoracic echocardiogram  01/02/2008    EF >55%, normal  . Colonoscopy N/A 07/11/2013    Procedure: COLONOSCOPY;  Surgeon: Inda Castle, MD;  Location: [facility];  Service: Endoscopy;  Laterality: N/A;   Family History  Problem Relation Age of Onset  . Heart disease Mother   . Heart attack Mother   . Heart disease Father   . Heart attack Father   . Heart disease Sister   . Hypertension Sister   . Heart disease Daughter   . Hypertension Daughter   . Hypertension Daughter   . Cystic fibrosis Maternal Aunt    History  Substance Use Topics  . Smoking status: Former Smoker -- 1.00 packs/day for 25 years    Types: Cigarettes    Quit date: 05/23/2004  . Smokeless tobacco: Never Used  . Alcohol Use: No   OB History   Grav Para Term Preterm Abortions TAB SAB Ect Mult Living                  Review of Systems  Constitutional: Negative for fever.  HENT: Positive for nosebleeds.   All other systems reviewed and are negative.     Allergies  Codeine; Lidocaine; Amlodipine; Atenolol; Bactrim; Bystolic; Ciprofloxacin; Levothyroxine; Metformin; Metoprolol; Morphine and related; and Penicillins  Home Medications   Prior to Admission medications   Medication Sig Start Date End Date Taking? Authorizing Provider  ALPRAZolam (XANAX XR) 1 MG 24 hr tablet Take 1 mg by mouth at bedtime. Anxiety    Historical Provider, MD  aspirin EC 81 MG tablet Take 81 mg by mouth every morning.     Historical Provider, MD  B Complex-C (SUPER B COMPLEX PO) Take 1 tablet  by mouth daily.     Historical Provider, MD  B-D ULTRAFINE III SHORT PEN 31G X 8 MM MISC  02/16/13   Historical Provider, MD  Biotin 5000 MCG CAPS Take 5,000 mcg by mouth daily.     Historical Provider, MD  Cholecalciferol (VITAMIN D) 2000 UNITS tablet Take 2,000 Units by mouth every evening.     Historical Provider, MD  cloNIDine (CATAPRES - DOSED IN MG/24 HR) 0.3 mg/24hr Place 1 patch onto the skin every Tuesday. Change on Tuesdays 12/17/12   Historical Provider, MD  cloNIDine (CATAPRES) 0.1 MG tablet Take 0.1 mg by mouth daily as needed (when BP is over 170).    Historical Provider, MD  clopidogrel (PLAVIX) 75 MG tablet Take 1 tablet (75 mg total) by mouth daily with breakfast. 08/29/13   Lorretta Harp, MD  Coenzyme Q10 (CO Q-10) 50 MG CAPS Take 50 mg by mouth daily.    Historical Provider, MD  diphenoxylate-atropine (LOMOTIL) 2.5-0.025 MG per tablet Take 1 tablet by mouth 2 (two) times daily as needed for diarrhea or loose stools. 07/12/13   Willia Craze, NP  doxycycline (VIBRA-TABS) 100 MG tablet Take 100 mg by mouth daily. 08/05/13   Historical Provider, MD  escitalopram (LEXAPRO) 20 MG tablet Take 20 mg by mouth 2 (two) times daily.     Historical Provider, MD  furosemide (LASIX) 40 MG tablet Take 40 mg by mouth 2 (two) times  daily.     Historical Provider, MD  hydrALAZINE (APRESOLINE) 100 MG tablet Take 1 tablet (100 mg total) by mouth 3 (three) times daily. 08/29/13   Lorretta Harp, MD  insulin aspart (NOVOLOG) 100 UNIT/ML injection Inject 28-32 Units into the skin 3 (three) times daily with meals. Takes 28 units with breakfast, 28 units with lunch, and 32 units with supper    Historical Provider, MD  insulin glargine (LANTUS) 100 UNIT/ML injection Inject 60 Units into the skin at bedtime.     Historical Provider, MD  irbesartan (AVAPRO) 300 MG tablet Take 300 mg by mouth at bedtime.  04/11/13   Tommy Medal, RPH-CPP  isosorbide mononitrate (IMDUR) 60 MG 24 hr tablet Take 1 tablet (60 mg total) by mouth at bedtime. 08/30/13   Lorretta Harp, MD  levothyroxine (SYNTHROID, LEVOTHROID) 112 MCG tablet Take 112 mcg by mouth every morning.     Historical Provider, MD  metroNIDAZOLE (FLAGYL) 250 MG tablet Take 250 mg by mouth daily. 06/17/13   Historical Provider, MD  nebivolol (BYSTOLIC) 5 MG tablet Take 5 mg by mouth every morning. 03/11/13   Lorretta Harp, MD  niacin (NIASPAN) 500 MG CR tablet Take 500 mg by mouth 2 (two) times daily.     Historical Provider, MD  Omega-3 Fatty Acids (FISH OIL TRIPLE STRENGTH PO) Take 2 capsules by mouth 2 (two) times daily.     Historical Provider, MD  oxyCODONE-acetaminophen (PERCOCET/ROXICET) 5-325 MG per tablet Take 325 tablets by mouth as needed. 08/08/13   Historical Provider, MD  potassium chloride SA (K-DUR,KLOR-CON) 20 MEQ tablet Take 1 tablet (20 mEq total) by mouth daily after breakfast. 08/29/13   Lorretta Harp, MD   BP 165/56  Pulse 66  Temp(Src) 98 F (36.7 C) (Oral)  Resp 18  Wt 191 lb (86.637 kg)  SpO2 94% Physical Exam  Nursing note and vitals reviewed. Constitutional: She is oriented to person, place, and time. She appears well-developed and well-nourished.  HENT:  Head: Normocephalic and atraumatic.  Dried blood noted  in left nare. No active bleeding.    Cardiovascular: Normal rate.   Pulmonary/Chest: Effort normal.  Abdominal: She exhibits no distension.  Neurological: She is alert and oriented to person, place, and time.  Skin: Skin is warm and dry.  Psychiatric: She has a normal mood and affect.    ED Course  Procedures (including critical care time)  DIAGNOSTIC STUDIES: Oxygen Saturation is 94% on RA, adequate by my interpretation.    COORDINATION OF CARE: 3:12 PM Discussed treatment plan with pt at bedside and pt agreed to plan.   Labs Review Labs Reviewed - No data to display  Imaging Review No results found.   EKG Interpretation None      MDM   Final diagnoses:  Epistaxis    Patient's nose is no longer bleeding. Vitals stable and patient afebrile. Patient will have recommended ENT follow up if symptoms return. No further evaluation needed at this time.   I personally performed the services described in this documentation, which was scribed in my presence. The recorded information has been reviewed and is accurate.   Alvina Chou, [place] 09/05/13 [phone]

## 2013-09-03 NOTE — ED Notes (Signed)
Pt is here with left nare nosebleed for 3 days and reports she is on plavix.  No pain

## 2013-09-03 NOTE — Telephone Encounter (Signed)
Pt's nose is still bleeding and she doesn't know what to do.  JB

## 2013-09-04 ENCOUNTER — Ambulatory Visit (HOSPITAL_COMMUNITY)
Admission: RE | Admit: 2013-09-04 | Discharge: 2013-09-04 | Disposition: A | Discharge status: Home / Self Care | Payer: Medicare Other | Source: Ambulatory Visit | Attending: Cardiovascular Disease | Admitting: Cardiovascular Disease

## 2013-09-04 DIAGNOSIS — I70219 Atherosclerosis of native arteries of extremities with intermittent claudication, unspecified extremity: Secondary | ICD-10-CM

## 2013-09-04 DIAGNOSIS — I739 Peripheral vascular disease, unspecified: Secondary | ICD-10-CM | POA: Insufficient documentation

## 2013-09-04 NOTE — Progress Notes (Signed)
Arterial Duplex Lower Ext. Completed. Natonya Finstad, BS, RDMS, RVT  

## 2013-09-06 NOTE — ED Provider Notes (Signed)
Medical screening examination/treatment/procedure(s) were performed by non-physician practitioner and as supervising physician I was immediately available for consultation/collaboration.   EKG Interpretation None       Jasper Riling. Alvino Chapel, MD 09/06/13 [phone]

## 2013-09-10 NOTE — Progress Notes (Signed)
Pt. Called x3 no answer

## 2013-09-12 ENCOUNTER — Telehealth: Payer: Self-pay | Admitting: Cardiovascular Disease

## 2013-09-12 NOTE — Telephone Encounter (Signed)
Please call Blood Pressure is 112/49 and feels dizzy.

## 2013-09-12 NOTE — Telephone Encounter (Signed)
Returned call and pt verified x 2.  Pt stated BP has been running 112/49 and she has been having dizziness.  Pt stated it happens when she stands.  Pt wants to know if Dr. Gwenlyn Found wants to change her BP meds.  Stated BP has been low since starting hydralazine.  Pt does not check BP often.  Stated it was similar yesterday, but pt did not record.    Advice  Record BP readings, no more than twice daily  Increase water intake  Orthostatic precautions given  Call back on Monday w/ BP readings  Evening/Weekend call instructions given  Pt informed Dr. Gwenlyn Found will be notified and if any changes, then she will be contacted before Monday.  Pt verbalized understanding and agreed w/ plan.   Message forwarded to Curt Bears, RN to discuss w/ Dr. Gwenlyn Found.

## 2013-09-23 NOTE — Telephone Encounter (Signed)
Encounter Closed---09/23/13 TP 

## 2013-09-30 ENCOUNTER — Other Ambulatory Visit: Payer: Self-pay | Admitting: *Deleted

## 2013-10-21 ENCOUNTER — Other Ambulatory Visit: Payer: Self-pay | Admitting: *Deleted

## 2013-10-21 MED ORDER — IRBESARTAN 300 MG PO TABS: 300.0000 mg | ORAL_TABLET | Freq: Every day | ORAL | Status: DC

## 2013-10-21 NOTE — Telephone Encounter (Signed)
Rx was sent to pharmacy electronically. 

## 2013-11-15 ENCOUNTER — Other Ambulatory Visit: Payer: Self-pay | Admitting: Pharmacist Clinician (PhC)/ Clinical Pharmacy Specialist

## 2013-11-27 ENCOUNTER — Telehealth (HOSPITAL_COMMUNITY): Payer: Self-pay | Admitting: *Deleted

## 2013-12-06 ENCOUNTER — Other Ambulatory Visit (HOSPITAL_COMMUNITY): Payer: Self-pay | Admitting: Cardiovascular Disease

## 2013-12-06 NOTE — Telephone Encounter (Signed)
Rx refill denied to patient pharmacy. Rx was sent in 08/29/13 for #[id number] and 2 additional refills

## 2013-12-12 ENCOUNTER — Telehealth: Payer: Self-pay | Admitting: Cardiovascular Disease

## 2013-12-12 NOTE — Telephone Encounter (Signed)
Please call,pt need a list of her allergies.

## 2013-12-12 NOTE — Telephone Encounter (Signed)
Returned call to patient's daughter list of patient's allergies read to daughter.

## 2013-12-15 ENCOUNTER — Other Ambulatory Visit (HOSPITAL_COMMUNITY): Payer: Self-pay | Admitting: Cardiovascular Disease

## 2013-12-17 ENCOUNTER — Ambulatory Visit (HOSPITAL_COMMUNITY)
Admission: RE | Admit: 2013-12-17 | Discharge: 2013-12-17 | Disposition: A | Discharge status: Home / Self Care | Payer: Medicare Other | Source: Ambulatory Visit | Attending: Cardiovascular Disease | Admitting: Cardiovascular Disease

## 2013-12-17 ENCOUNTER — Other Ambulatory Visit (HOSPITAL_COMMUNITY): Payer: Self-pay | Admitting: Cardiovascular Disease

## 2013-12-17 DIAGNOSIS — I701 Atherosclerosis of renal artery: Secondary | ICD-10-CM | POA: Insufficient documentation

## 2013-12-17 DIAGNOSIS — I10 Essential (primary) hypertension: Secondary | ICD-10-CM

## 2013-12-17 NOTE — Progress Notes (Signed)
Renal Duplex Completed. Duward Allbritton, BS, RDMS, RVT  

## 2013-12-18 ENCOUNTER — Other Ambulatory Visit (HOSPITAL_COMMUNITY): Payer: Self-pay | Admitting: Cardiovascular Disease

## 2013-12-18 NOTE — Telephone Encounter (Signed)
Refilled #[id number] tablets with 5 refills on 12/16/13

## 2013-12-19 NOTE — Telephone Encounter (Signed)
Refilled #[id number] tablets with 5 refills on 12/16/13

## 2013-12-20 ENCOUNTER — Telehealth: Payer: Self-pay | Admitting: *Deleted

## 2013-12-20 DIAGNOSIS — I701 Atherosclerosis of renal artery: Secondary | ICD-10-CM

## 2013-12-20 NOTE — Telephone Encounter (Signed)
Patient notified of renal doppler results. Will repeat in 6 months.

## 2013-12-20 NOTE — Telephone Encounter (Signed)
Message copied by Fidel Levy on Fri Dec 20, 2013  4:44 PM ------      Message from: Lorretta Harp      Created: Thu Dec 19, 2013  9:22 AM       No change from prior study. Repeat in 6 months ------

## 2013-12-24 ENCOUNTER — Telehealth: Payer: Self-pay | Admitting: *Deleted

## 2013-12-24 NOTE — Telephone Encounter (Signed)
Message copied by Chauncy Lean on Tue Dec 24, 2013  1:56 PM ------      Message from: Fidel Levy      Created: Fri Dec 20, 2013  4:47 PM      Regarding: sleep mask       Patient asked about getting new CPAP mask.Michela Pitcher she has tried just about every mask there is when asked what mask she wanted ordered.Marland Kitchen ------

## 2013-12-30 NOTE — Telephone Encounter (Signed)
Follow Up    Pt is requesting some options for a different CPAP mask/CPAP machine. States current mask is making a lot of noise when in use and is keeping her from sleep at night time. Please call.

## 2013-12-31 NOTE — Telephone Encounter (Signed)
I spoke with [name]. She has been working with [employer] trying out different masks, but she cannot find on that works well for her.  She says that she cannot sleep through the night with the masks.  I advised that she might need to come in for an appt with Dr Claiborne Billings.  She agreed.  I will reach out to [facility] for advise.

## 2013-12-31 NOTE — Telephone Encounter (Signed)
lmom 

## 2013-12-31 NOTE — Telephone Encounter (Signed)
Follow Up    Husband calling to follow up on call yesterday regarding CPAP machine. Please call.

## 2014-01-01 NOTE — Telephone Encounter (Signed)
Spoke with Gwinda Passe at [employer]. She will have a RT contact the patient to see if they can assist her.

## 2014-01-19 IMAGING — US US EXTREM UP *R* LTD
1 series · 14 of 20 positions shown · non-contrast
Comparison: None

CLINICAL DATA: Palpable abnormality high proximal medial right arm
versus axilla

ULTRASOUND RIGHT UPPER EXTREMITY LIMITED
TECHNIQUE: Ultrasound examination of the region of interest in the
right upper extremity was performed.

[Series 1: us extrem up *right* ltd · 0.07mm/px · 14 of 20 slices shown]
[im 1/20]
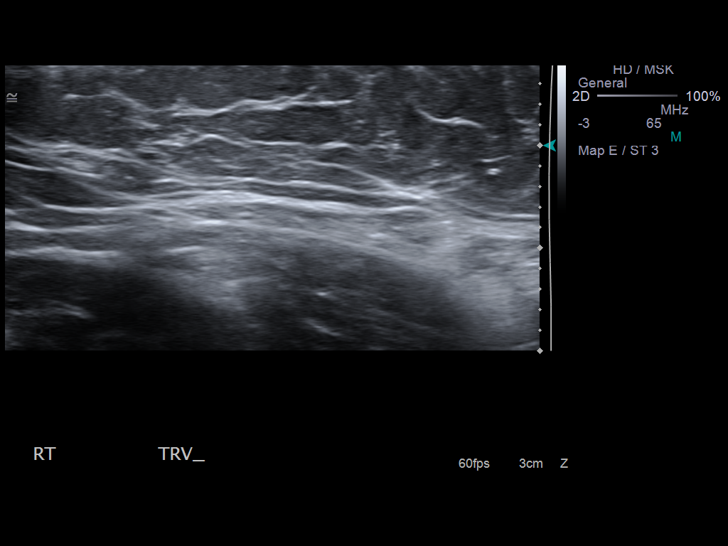
[im 3/20]
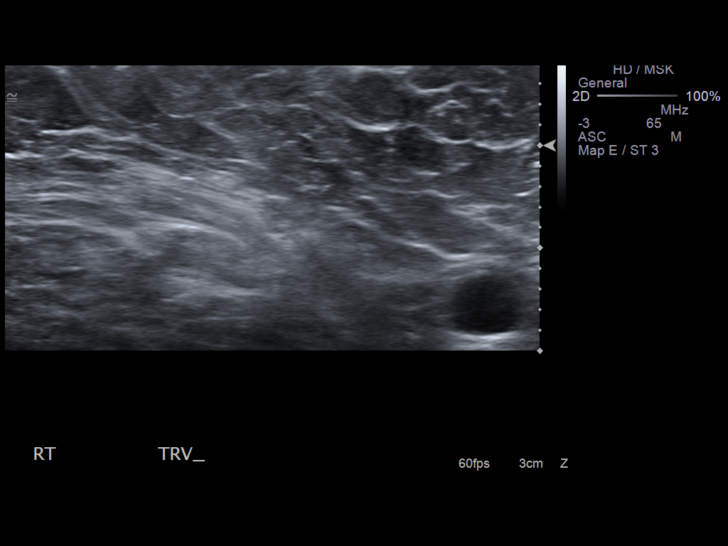
[im 4/20]
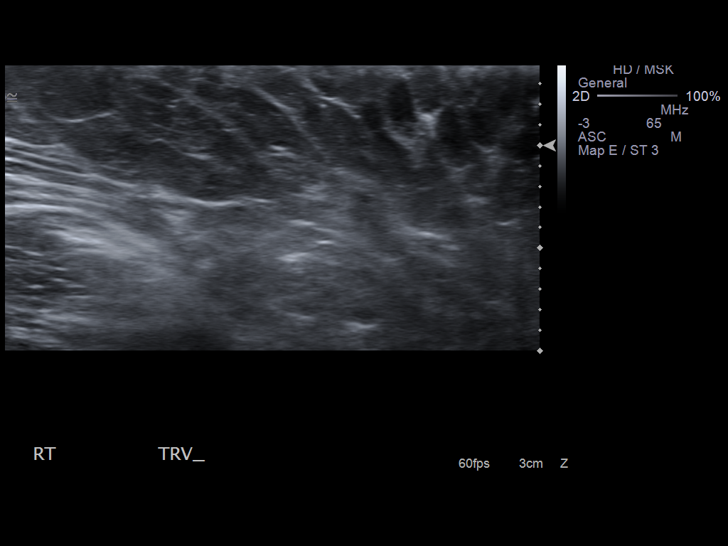
[im 6/20]
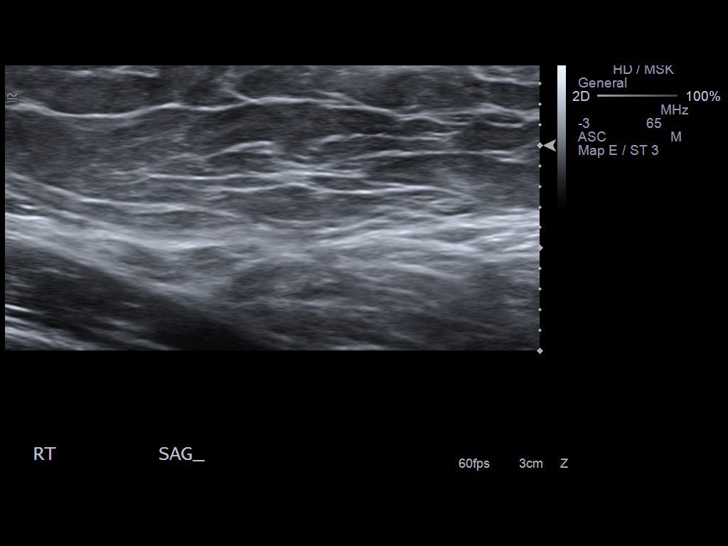
[im 7/20]
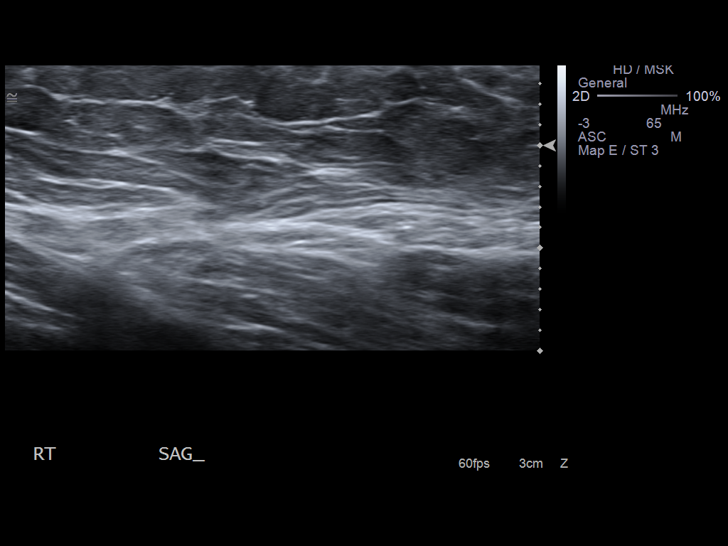
[im 8/20]
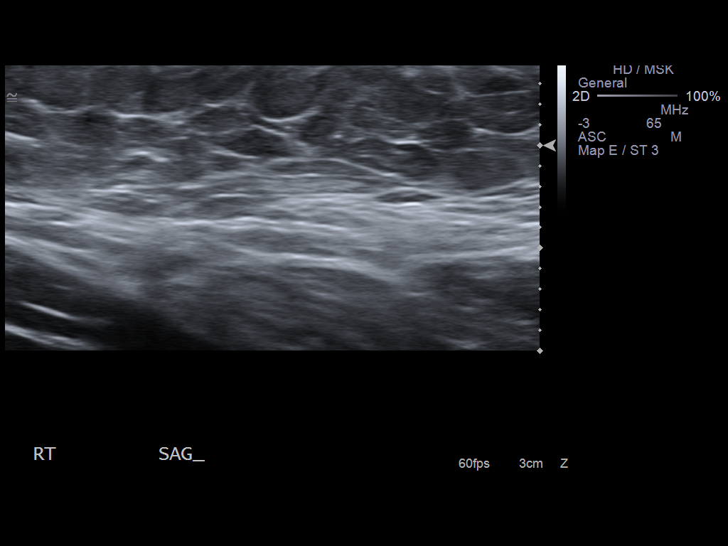
[im 10/20]
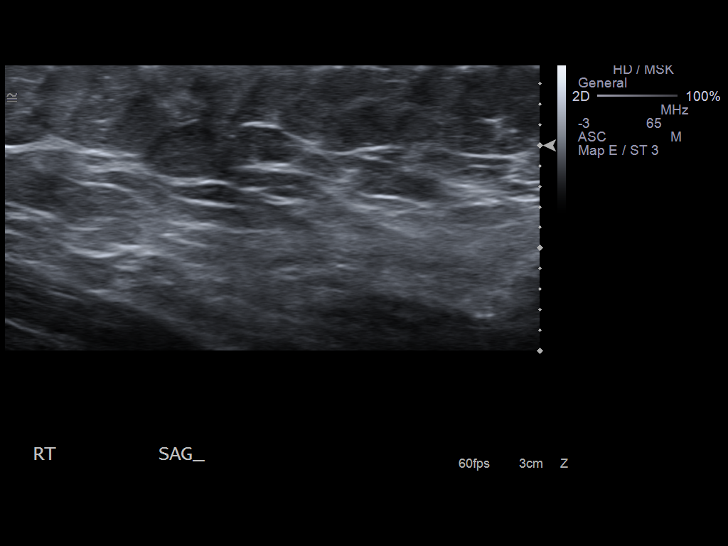
[im 11/20]
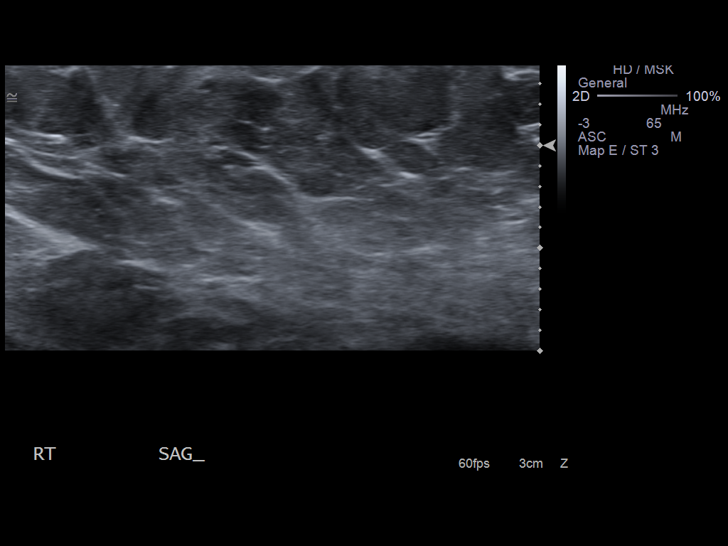
[im 13/20]
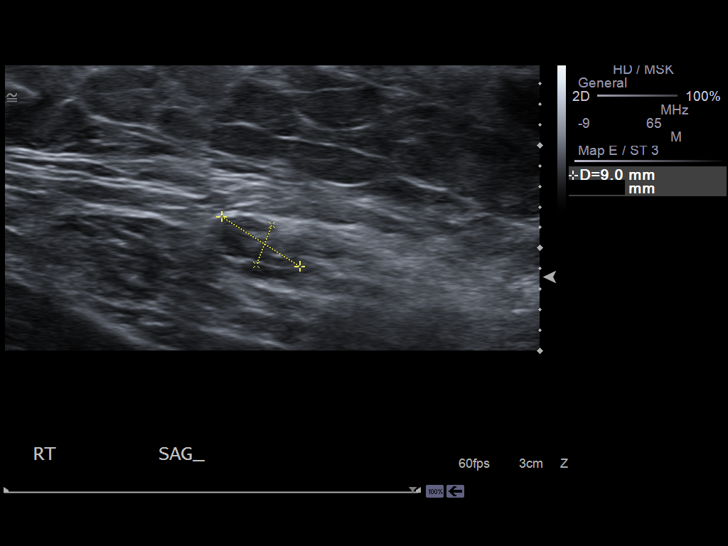
[im 14/20]
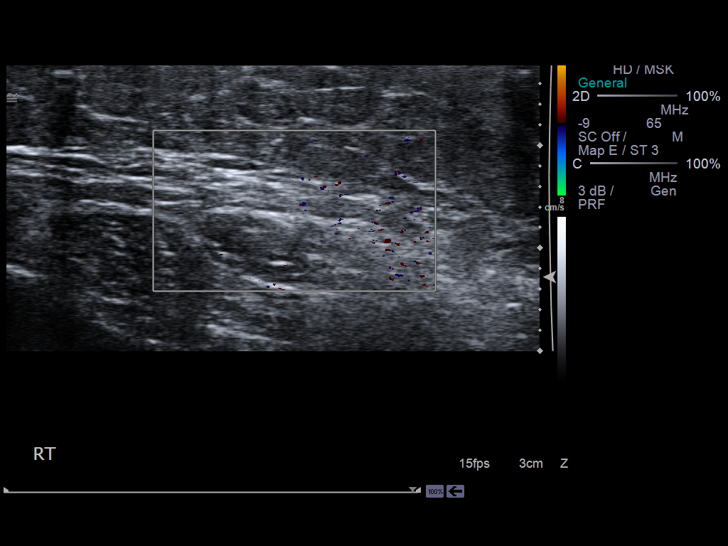
[im 16/20]
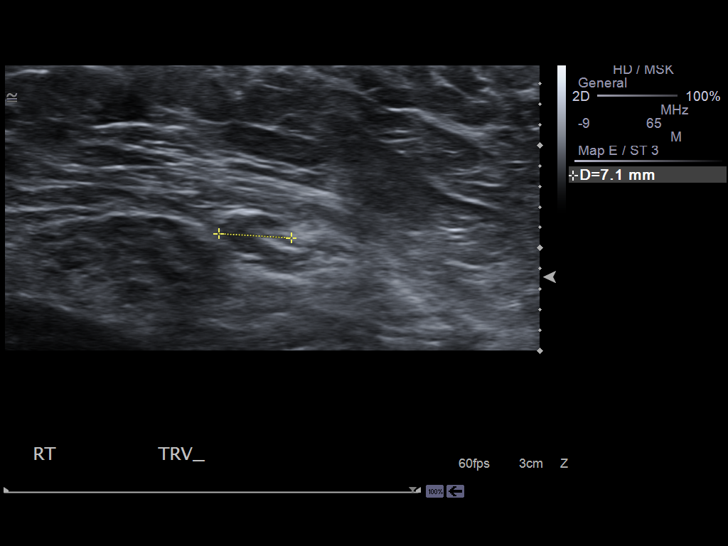
[im 17/20]
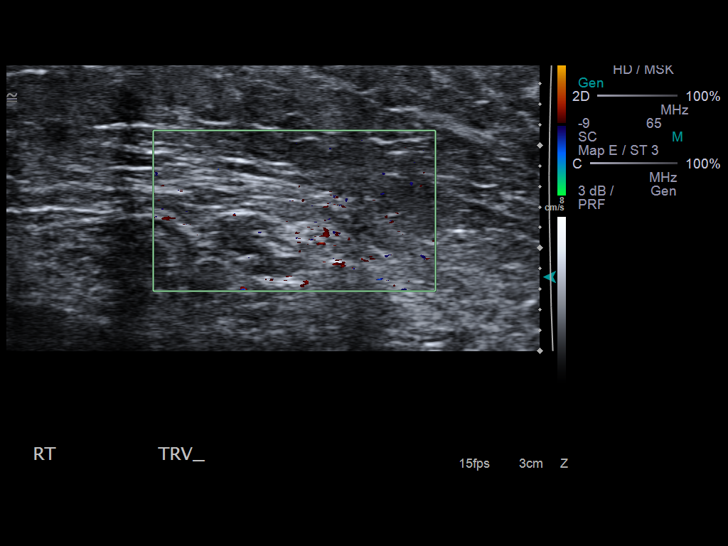
[im 18/20]
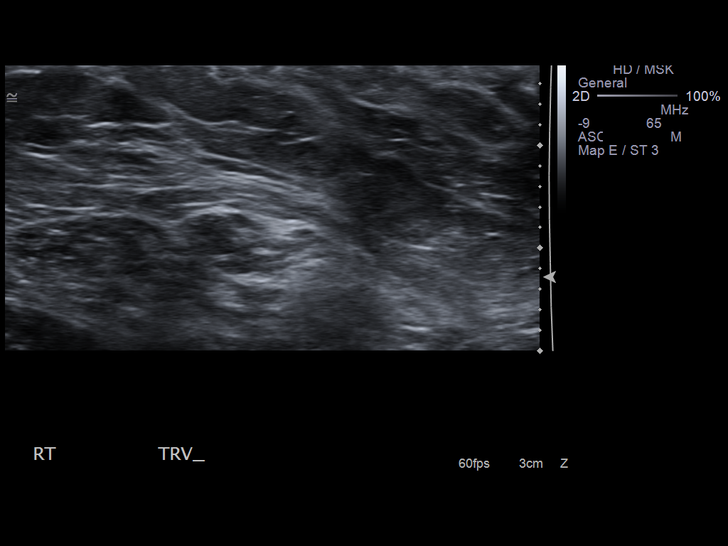
[im 20/20]
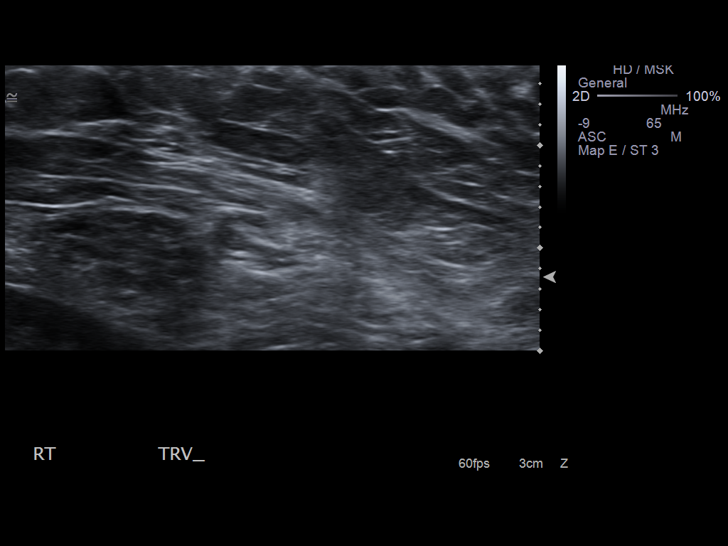

[14 of 20 positions shown; findings below may reference images not displayed]

FINDINGS: I personally examined the patient and imaged through the region of
clinical concern.
Small deep normal-sized right axillary lymph node is visualized in
this general vicinity, deeper than expected for the palpable
finding.
Normal appearing subcutaneous fat and soft tissues visualized.
No evidence of mass, adenopathy or focal fluid collection
identified at the site of clinical concern.
IMPRESSION: Negative ultrasound of the site of clinical concern at the right
axilla/proximal right upper arm medially.
Clinical management of this site is recommended.

## 2014-01-20 ENCOUNTER — Telehealth: Payer: Self-pay | Admitting: *Deleted

## 2014-01-20 NOTE — Telephone Encounter (Signed)
Faxed signed order for CPAP H2O chamber to [place].@ [phone].

## 2014-02-03 ENCOUNTER — Inpatient Hospital Stay (HOSPITAL_COMMUNITY)
Admission: EM | Admit: 2014-02-03 | Discharge: 2014-02-05 | DRG: 683 | Disposition: A | Discharge status: Home / Self Care | Payer: Medicare Other | Attending: Internal Medicine | Admitting: Internal Medicine

## 2014-02-03 ENCOUNTER — Encounter (HOSPITAL_COMMUNITY): Payer: Self-pay | Admitting: Emergency Medicine

## 2014-02-03 ENCOUNTER — Emergency Department (HOSPITAL_COMMUNITY): Payer: Medicare Other

## 2014-02-03 DIAGNOSIS — L0390 Cellulitis, unspecified: Secondary | ICD-10-CM | POA: Diagnosis present

## 2014-02-03 DIAGNOSIS — N179 Acute kidney failure, unspecified: Principal | ICD-10-CM | POA: Diagnosis present

## 2014-02-03 DIAGNOSIS — F32A Depression, unspecified: Secondary | ICD-10-CM

## 2014-02-03 DIAGNOSIS — I129 Hypertensive chronic kidney disease with stage 1 through stage 4 chronic kidney disease, or unspecified chronic kidney disease: Secondary | ICD-10-CM | POA: Diagnosis present

## 2014-02-03 DIAGNOSIS — Z9849 Cataract extraction status, unspecified eye: Secondary | ICD-10-CM

## 2014-02-03 DIAGNOSIS — Z8673 Personal history of transient ischemic attack (TIA), and cerebral infarction without residual deficits: Secondary | ICD-10-CM

## 2014-02-03 DIAGNOSIS — L03213 Periorbital cellulitis: Secondary | ICD-10-CM

## 2014-02-03 DIAGNOSIS — D126 Benign neoplasm of colon, unspecified: Secondary | ICD-10-CM

## 2014-02-03 DIAGNOSIS — I509 Heart failure, unspecified: Secondary | ICD-10-CM | POA: Diagnosis present

## 2014-02-03 DIAGNOSIS — D631 Anemia in chronic kidney disease: Secondary | ICD-10-CM | POA: Diagnosis present

## 2014-02-03 DIAGNOSIS — D638 Anemia in other chronic diseases classified elsewhere: Secondary | ICD-10-CM

## 2014-02-03 DIAGNOSIS — Z87891 Personal history of nicotine dependence: Secondary | ICD-10-CM

## 2014-02-03 DIAGNOSIS — I701 Atherosclerosis of renal artery: Secondary | ICD-10-CM

## 2014-02-03 DIAGNOSIS — N2889 Other specified disorders of kidney and ureter: Secondary | ICD-10-CM

## 2014-02-03 DIAGNOSIS — L0201 Cutaneous abscess of face: Secondary | ICD-10-CM | POA: Diagnosis present

## 2014-02-03 DIAGNOSIS — Z961 Presence of intraocular lens: Secondary | ICD-10-CM

## 2014-02-03 DIAGNOSIS — N039 Chronic nephritic syndrome with unspecified morphologic changes: Secondary | ICD-10-CM

## 2014-02-03 DIAGNOSIS — M129 Arthropathy, unspecified: Secondary | ICD-10-CM | POA: Diagnosis present

## 2014-02-03 DIAGNOSIS — Z794 Long term (current) use of insulin: Secondary | ICD-10-CM

## 2014-02-03 DIAGNOSIS — E785 Hyperlipidemia, unspecified: Secondary | ICD-10-CM

## 2014-02-03 DIAGNOSIS — D72829 Elevated white blood cell count, unspecified: Secondary | ICD-10-CM

## 2014-02-03 DIAGNOSIS — N183 Chronic kidney disease, stage 3 unspecified: Secondary | ICD-10-CM | POA: Diagnosis present

## 2014-02-03 DIAGNOSIS — E162 Hypoglycemia, unspecified: Secondary | ICD-10-CM

## 2014-02-03 DIAGNOSIS — K219 Gastro-esophageal reflux disease without esophagitis: Secondary | ICD-10-CM | POA: Diagnosis present

## 2014-02-03 DIAGNOSIS — I150 Renovascular hypertension: Secondary | ICD-10-CM

## 2014-02-03 DIAGNOSIS — H109 Unspecified conjunctivitis: Secondary | ICD-10-CM | POA: Diagnosis present

## 2014-02-03 DIAGNOSIS — I739 Peripheral vascular disease, unspecified: Secondary | ICD-10-CM

## 2014-02-03 DIAGNOSIS — E039 Hypothyroidism, unspecified: Secondary | ICD-10-CM

## 2014-02-03 DIAGNOSIS — N058 Unspecified nephritic syndrome with other morphologic changes: Secondary | ICD-10-CM | POA: Diagnosis present

## 2014-02-03 DIAGNOSIS — E1122 Type 2 diabetes mellitus with diabetic chronic kidney disease: Secondary | ICD-10-CM

## 2014-02-03 DIAGNOSIS — N189 Chronic kidney disease, unspecified: Secondary | ICD-10-CM

## 2014-02-03 DIAGNOSIS — L03211 Cellulitis of face: Secondary | ICD-10-CM

## 2014-02-03 DIAGNOSIS — I5032 Chronic diastolic (congestive) heart failure: Secondary | ICD-10-CM

## 2014-02-03 DIAGNOSIS — F3289 Other specified depressive episodes: Secondary | ICD-10-CM | POA: Diagnosis present

## 2014-02-03 DIAGNOSIS — Z79899 Other long term (current) drug therapy: Secondary | ICD-10-CM

## 2014-02-03 DIAGNOSIS — G4733 Obstructive sleep apnea (adult) (pediatric): Secondary | ICD-10-CM | POA: Diagnosis present

## 2014-02-03 DIAGNOSIS — I2510 Atherosclerotic heart disease of native coronary artery without angina pectoris: Secondary | ICD-10-CM | POA: Diagnosis present

## 2014-02-03 DIAGNOSIS — I2789 Other specified pulmonary heart diseases: Secondary | ICD-10-CM | POA: Diagnosis present

## 2014-02-03 DIAGNOSIS — E1129 Type 2 diabetes mellitus with other diabetic kidney complication: Secondary | ICD-10-CM | POA: Diagnosis present

## 2014-02-03 DIAGNOSIS — I272 Pulmonary hypertension, unspecified: Secondary | ICD-10-CM

## 2014-02-03 DIAGNOSIS — G4730 Sleep apnea, unspecified: Secondary | ICD-10-CM

## 2014-02-03 DIAGNOSIS — I330 Acute and subacute infective endocarditis: Secondary | ICD-10-CM

## 2014-02-03 DIAGNOSIS — Z7902 Long term (current) use of antithrombotics/antiplatelets: Secondary | ICD-10-CM

## 2014-02-03 DIAGNOSIS — H5789 Other specified disorders of eye and adnexa: Secondary | ICD-10-CM | POA: Diagnosis not present

## 2014-02-03 DIAGNOSIS — B955 Unspecified streptococcus as the cause of diseases classified elsewhere: Secondary | ICD-10-CM

## 2014-02-03 DIAGNOSIS — I10 Essential (primary) hypertension: Secondary | ICD-10-CM | POA: Diagnosis present

## 2014-02-03 DIAGNOSIS — I252 Old myocardial infarction: Secondary | ICD-10-CM

## 2014-02-03 DIAGNOSIS — H16009 Unspecified corneal ulcer, unspecified eye: Secondary | ICD-10-CM | POA: Diagnosis present

## 2014-02-03 DIAGNOSIS — F329 Major depressive disorder, single episode, unspecified: Secondary | ICD-10-CM

## 2014-02-03 LAB — CBC WITH DIFFERENTIAL/PLATELET
Basophils Absolute: 0.1 10*3/uL (ref 0.0–0.1)
Basophils Relative: 1 % (ref 0–1)
EOS ABS: 0.5 10*3/uL (ref 0.0–0.7)
EOS PCT: 4 % (ref 0–5)
HEMATOCRIT: 34.6 % — AB (ref 36.0–46.0)
HEMOGLOBIN: 11.7 g/dL — AB (ref 12.0–15.0)
LYMPHS PCT: 25 % (ref 12–46)
Lymphs Abs: 2.8 10*3/uL (ref 0.7–4.0)
MCH: 31.3 pg (ref 26.0–34.0)
MCHC: 33.8 g/dL (ref 30.0–36.0)
MCV: 92.5 fL (ref 78.0–100.0)
MONOS PCT: 9 % (ref 3–12)
Monocytes Absolute: 1 10*3/uL (ref 0.1–1.0)
Neutro Abs: 6.8 10*3/uL (ref 1.7–7.7)
Neutrophils Relative %: 61 % (ref 43–77)
Platelets: 300 10*3/uL (ref 150–400)
RBC: 3.74 MIL/uL — AB (ref 3.87–5.11)
RDW: 15.9 % — ABNORMAL HIGH (ref 11.5–15.5)
WBC: 11.2 10*3/uL — AB (ref 4.0–10.5)

## 2014-02-03 LAB — I-STAT CG4 LACTIC ACID, ED: LACTIC ACID, VENOUS: 1.39 mmol/L (ref 0.5–2.2)

## 2014-02-03 LAB — I-STAT TROPONIN, ED: TROPONIN I, POC: 0.05 ng/mL (ref 0.00–0.08)

## 2014-02-03 LAB — CBG MONITORING, ED: GLUCOSE-CAPILLARY: 73 mg/dL (ref 70–99)

## 2014-02-03 MED ORDER — SODIUM CHLORIDE 0.9 % IV BOLUS (SEPSIS)
1000.0000 mL | Freq: Once | INTRAVENOUS | Status: AC
  Administered 2014-02-03: 1000 mL via INTRAVENOUS

## 2014-02-03 NOTE — ED Notes (Signed)
Per EMS, patient is being treated it opthalmic drops and PO antibiotics for eye infection, corneal ulcer. Patient has not been able to see from eye for the past 2 weeks. She called EMS today because she began to have fever and chills.

## 2014-02-03 NOTE — ED Provider Notes (Signed)
CSN: [id number]     Arrival date & time    History   First MD Initiated Contact with Patient 02/03/14 2257     Chief Complaint  Patient presents with  . Eye Problem     (Consider location/radiation/quality/duration/timing/severity/associated sxs/prior Treatment) The history is provided by the patient.  [name] is a 68 y.o. female hx of HTN, DM, CKD, endocarditis here with eye pain, generalize weakness, chills, fevers. R eye pain and loss of vision for the last 2 weeks. Seen ophtho a week ago and was diagnosed with severe conjunctivitis. At that time, she was unable to count fingers with right eye. She was started on doxycycline PO and bactrim drops. Patient has been seeing multiple specialist but eye vision hasn't improved. Today, started having low grade temp (98 per patient) and subjective chills. Some chest tightness but no chest pain. No cough. No abdominal pain, just nauseated.    Past Medical History  Diagnosis Date  . HTN (hypertension)   . Elevated troponin level 09/28/11    Type II MI - not ACS related  . Diastolic dysfunction, left ventricle     with previous Diastolic HF  . Diabetes mellitus   . High cholesterol   . Kidney disease   . Cataracts, bilateral   . 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 Pulmonary HTN-severe- due to diastolic dysfunction 09/27/8500    Moderate to Severe Pulm HTN.  Marland Kitchen Acute renal failure 09/29/2011  . 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 HYPOTHYROIDISM 06/19/2008    Qualifier: Diagnosis of  By: Burt Knack CMA, Cecille Rubin    . Renal artery stenosis     s/p stents (restenosis)  . Subclavian arterial stenosis   . Peripheral neuropathy   . Coronary artery disease   . Myocardial infarction   . Pericarditis   . Anxiety   . Depression   . Shortness of breath   . CHF (congestive heart failure)   . GERD (gastroesophageal reflux disease)   . Obstructive sleep apnea on  nocturnal CPAP 06/20/2008    AHI-1.0/hr, AHI REM-7.5/hr  . Unstable angina   . Stroke   . Arthritis   . Vertigo   . Complication of anesthesia   . PONV (postoperative nausea and vomiting)   . Family history of anesthesia complication     MOTHER ALSO HAD NAUSEA  . Blood clot in abdominal vein 01/09/2013  . History of nuclear stress test 10/10/2011    lexiscan; no perfusion defects    Past Surgical History  Procedure Laterality Date  . Cardiac catheterization  01/2011    Minimal luminal irregularties  . Laparoscopic tubal ligation    . Renal artery stent Bilateral     x 2 (restenosis intervention with iCAST covered stents)  . Colonoscopy  10/12/2011    Procedure: COLONOSCOPY;  Surgeon: Inda Castle, MD;  Location: [place];  Service: Endoscopy;  Laterality: N/A;  . Esophagogastroduodenoscopy  10/12/2011    Procedure: ESOPHAGOGASTRODUODENOSCOPY (EGD);  Surgeon: Inda Castle, MD;  Location: [place];  Service: Endoscopy;  Laterality: N/A;  . Tubal ligation    . Colonoscopy  10/21/2011    Procedure: COLONOSCOPY;  Surgeon: Irene Shipper, MD;  Location: [place];  Service: Endoscopy;  Laterality: N/A;  . Colonoscopy  10/25/2011    Procedure: COLONOSCOPY;  Surgeon: Jerene Bears, MD;  Location: [place];  Service: Gastroenterology;  Laterality: N/A;  . Colonoscopy  10/23/2011    Procedure: COLONOSCOPY;  Surgeon: Irene Shipper, MD;  Location: [facility];  Service: Endoscopy;  Laterality: N/A;  . Colon surgery    . Back surgery       x 2  . Cataract extraction w/phaco  03/29/2012    Procedure: CATARACT EXTRACTION PHACO AND INTRAOCULAR LENS PLACEMENT (IOC);  Surgeon: Tonny Branch, MD;  Location: [facility];  Service: Ophthalmology;  Laterality: Right;  CDE: 16.87  . Cataract extraction w/phaco  04/16/2012    Procedure: CATARACT EXTRACTION PHACO AND INTRAOCULAR LENS PLACEMENT (IOC);  Surgeon: Tonny Branch, MD;  Location: [facility];  Service: Ophthalmology;  Laterality: Left;  CDE:  14.98  .  Transthoracic echocardiogram  12/16/2011    EF 55-60%; mod LVH & mod conc LVH, grade 2 diastolic dysfunction; mild MR; mod-severely calcfied MR annulus; LA mildly dilated; RV systolic pressure increased; mild TR  . Renal doppler  01/18/2010    Abdominal Aorta-50-69% diameter reduction, L Proximal Renal Artery stent-60% diameter reduction-no visible narrowing noted.  . Cardiac catheterization  11/25/2005    Normal coronary arteries and LV systolic function. There is a 90% in-stent restenosis diffusely.  . Cardiac catheterization  09/04/2007    LAD had a 50% segmental proximal stenosis.  . Renal angiogram Bilateral 09/04/2007    L Renal artery 95% ostial stenosis, stented w/ a 6x12 Genesis stent at 12atm, R Renal artery 95% ostial stenosis, stented w/ a 6x12 Genesis stent. Resulting in reduction of bilateral 95% ostial renal artery stenosis to 0% residual with excellent results.  . Renal angiogram Bilateral 05/06/2009    L Renal artery 80& in-stent restenosis stented w/ a 5x16iCAST PTFE-covered stent at 11-12atm, resulting in reduction of 80% stenosis to 0% residual. R Renal artery 90% stenosis stented w/ a 6x16 [employer]rium iCAST PTFE-covered stent at 12atm resulting in reduction of 90% stenosis to 0% residual.  . Lexiscan myoview  04/15/2008    No significant ischemia demonstrated  . Transthoracic echocardiogram  01/02/2008    EF >55%, normal  . Colonoscopy N/A 07/11/2013    Procedure: COLONOSCOPY;  Surgeon: Inda Castle, MD;  Location: [facility];  Service: Endoscopy;  Laterality: N/A;   Family History  Problem Relation Age of Onset  . Heart disease Mother   . Heart attack Mother   . Heart disease Father   . Heart attack Father   . Heart disease Sister   . Hypertension Sister   . Heart disease Daughter   . Hypertension Daughter   . Hypertension Daughter   . Cystic fibrosis Maternal Aunt    History  Substance Use Topics  . Smoking status: Former Smoker -- 1.00 packs/day for 25 years     Types: Cigarettes    Quit date: 05/23/2004  . Smokeless tobacco: Never Used  . Alcohol Use: No   OB History   Grav Para Term Preterm Abortions TAB SAB Ect Mult Living                 Review of Systems  Constitutional: Positive for fever, chills and fatigue.  Gastrointestinal: Positive for nausea.  All other systems reviewed and are negative.     Allergies  Codeine; Lidocaine; Amlodipine; Atenolol; Bactrim; Bystolic; Ciprofloxacin; Levothyroxine; Metformin; Metoprolol; Morphine and related; and Penicillins  Home Medications   Prior to Admission medications   Medication Sig Start Date End Date Taking? Authorizing Provider  ALPRAZolam (XANAX XR) 1 MG 24 hr tablet Take 1 mg by mouth at bedtime. Anxiety   Yes Historical Provider, MD  B Complex-C (SUPER B  COMPLEX PO) Take 1 tablet by mouth daily.    Yes Historical Provider, MD  bacitracin-polymyxin b (POLYSPORIN) ophthalmic ointment Place 1 application into the right eye See admin instructions. Alternating with other eye drop every hour.   Yes Historical Provider, MD  Biotin 5000 MCG CAPS Take 5,000 mcg by mouth daily.    Yes Historical Provider, MD  Bromfenac Sodium (PROLENSA) 0.07 % SOLN Place 1 drop into the right eye See admin instructions. Alternating with other eye drop every hour.   Yes Historical Provider, MD  Cholecalciferol (VITAMIN D) 2000 UNITS tablet Take 2,000 Units by mouth every evening.    Yes Historical Provider, MD  cloNIDine (CATAPRES - DOSED IN MG/24 HR) 0.3 mg/24hr Place 1 patch onto the skin once a week. Change on Saturday 12/17/12  Yes Historical Provider, MD  clopidogrel (PLAVIX) 75 MG tablet Take 1 tablet (75 mg total) by mouth daily with breakfast. 08/29/13  Yes Lorretta Harp, MD  Coenzyme Q10 (CO Q-10) 50 MG CAPS Take 50 mg by mouth daily.   Yes Historical Provider, MD  diphenoxylate-atropine (LOMOTIL) 2.5-0.025 MG per tablet Take 1 tablet by mouth 2 (two) times daily as needed for diarrhea or loose stools.  07/12/13  Yes Willia Craze, NP  escitalopram (LEXAPRO) 20 MG tablet Take 20 mg by mouth 2 (two) times daily.    Yes Historical Provider, MD  furosemide (LASIX) 20 MG tablet Take 20 mg by mouth 2 (two) times daily.   Yes Historical Provider, MD  hydrALAZINE (APRESOLINE) 100 MG tablet Take 100 mg by mouth 3 (three) times daily.   Yes Historical Provider, MD  insulin aspart (NOVOLOG) 100 UNIT/ML injection Inject 28-34 Units into the skin 3 (three) times daily with meals. Takes 28 units with breakfast, 28 units with lunch, and 34 units with supper   Yes Historical Provider, MD  insulin glargine (LANTUS) 100 UNIT/ML injection Inject 60 Units into the skin at bedtime.    Yes Historical Provider, MD  irbesartan (AVAPRO) 300 MG tablet Take 1 tablet (300 mg total) by mouth at bedtime. 10/21/13  Yes Lorretta Harp, MD  isosorbide mononitrate (IMDUR) 60 MG 24 hr tablet Take 1 tablet (60 mg total) by mouth at bedtime. 08/30/13  Yes Lorretta Harp, MD  levothyroxine (SYNTHROID, LEVOTHROID) 112 MCG tablet Take 112 mcg by mouth every morning.    Yes Historical Provider, MD  nebivolol (BYSTOLIC) 5 MG tablet Take 5 mg by mouth every morning. 03/11/13  Yes Lorretta Harp, MD  niacin (NIASPAN) 500 MG CR tablet Take 500 mg by mouth 2 (two) times daily.    Yes Historical Provider, MD  Omega-3 Fatty Acids (FISH OIL TRIPLE STRENGTH PO) Take 2 capsules by mouth 2 (two) times daily.    Yes Historical Provider, MD  oxyCODONE-acetaminophen (PERCOCET/ROXICET) 5-325 MG per tablet Take 0.5-1 tablets by mouth every 6 (six) hours as needed for moderate pain.  08/08/13  Yes Historical Provider, MD  potassium chloride SA (K-DUR,KLOR-CON) 20 MEQ tablet Take 1 tablet (20 mEq total) by mouth daily after breakfast. 08/29/13  Yes Lorretta Harp, MD   BP 111/38  Pulse 82  Temp(Src) 99 F (37.2 C) (Rectal)  Resp 18  SpO2 96% Physical Exam  Nursing note and vitals reviewed. Constitutional: She is oriented to person, place, and  time.  Chronically ill, tired   HENT:  Head: Normocephalic.  Mouth/Throat: Oropharynx is clear and moist.  Eyes:  R eye with obvious severe conjunctivitis, L eye normal.  Minimal swelling around the eye. Extra ocular movements intact   Neck: Normal range of motion. Neck supple. No tracheal deviation present. No thyromegaly present.  Cardiovascular: Normal rate, regular rhythm and normal heart sounds.   No obvious murmurs   Pulmonary/Chest: Effort normal and breath sounds normal. No respiratory distress. She has no wheezes. She has no rales.  Abdominal: Soft. Bowel sounds are normal. She exhibits no distension. There is no tenderness. There is no rebound and no guarding.  Musculoskeletal: Normal range of motion. She exhibits no edema and no tenderness.  Neurological: She is alert and oriented to person, place, and time. No cranial nerve deficit. Coordination normal.  Skin: Skin is warm.  Psychiatric: She has a normal mood and affect. Her behavior is normal. Judgment and thought content normal.    ED Course  Procedures (including critical care time) Labs Review Labs Reviewed  CBC WITH DIFFERENTIAL - Abnormal; Notable for the following:    WBC 11.2 (*)    RBC 3.74 (*)    Hemoglobin 11.7 (*)    HCT 34.6 (*)    RDW 15.9 (*)    All other components within normal limits  COMPREHENSIVE METABOLIC PANEL - Abnormal; Notable for the following:    CO2 17 (*)    Glucose, Bld 68 (*)    BUN 77 (*)    Creatinine, Ser 2.45 (*)    Albumin 3.4 (*)    GFR calc non Af Amer 19 (*)    GFR calc Af Amer 23 (*)    Anion gap 18 (*)    All other components within normal limits  CBG MONITORING, ED - Abnormal; Notable for the following:    Glucose-Capillary 69 (*)    All other components within normal limits  CULTURE, BLOOD (ROUTINE X 2)  CULTURE, BLOOD (ROUTINE X 2)  URINE CULTURE  URINALYSIS, ROUTINE W REFLEX MICROSCOPIC  I-STAT CG4 LACTIC ACID, ED  CBG MONITORING, ED  Randolm Idol, ED     Imaging Review Dg Chest 2 View  02/04/2014   CLINICAL DATA:  Chest pain.  EXAM: CHEST  2 VIEW  COMPARISON:  Chest radiograph performed 12/09/2012  FINDINGS: The lungs are well-aerated and clear. There is no evidence of focal opacification, pleural effusion or pneumothorax.  The heart is normal in size; the mediastinal contour is within normal limits. No acute osseous abnormalities are seen.  IMPRESSION: No acute cardiopulmonary process seen.   Electronically Signed   By: Garald Balding M.D.   On: 02/04/2014 00:00   Ct Head Wo Contrast  02/04/2014   CLINICAL DATA:  Sudden onset of right-sided visual loss.  EXAM: CT HEAD WITHOUT CONTRAST  CT MAXILLOFACIAL WITHOUT CONTRAST  TECHNIQUE: Multidetector CT imaging of the head and maxillofacial structures were performed using the standard protocol without intravenous contrast. Multiplanar CT image reconstructions of the maxillofacial structures were also generated.  COMPARISON:  CT of the head performed 12/09/2012, and MRI of the brain from 12/06/2012  FINDINGS: CT HEAD FINDINGS  There is no evidence of acute infarction, mass lesion, or intra- or extra-axial hemorrhage on CT.  Diffuse periventricular and subcortical white matter change likely reflects small vessel ischemic microangiopathy. A small chronic infarct is noted in the right cerebellar hemisphere. Prominence of the ventricles and sulci suggests mild cortical volume loss.  The brainstem and fourth ventricle are within normal limits. The basal ganglia are unremarkable in appearance. The cerebral hemispheres demonstrate grossly normal gray-white differentiation. No mass effect or midline shift is seen.  There is no evidence of fracture; visualized osseous structures are unremarkable in appearance. There is suggestion of mild eyelid thickening on the right side, which could reflect mild preseptal cellulitis. The orbits are within normal limits. There is complete opacification of the right mastoid air cells,  and partial opacification of the left mastoid air cells. The paranasal sinuses are well-aerated. No significant soft tissue abnormalities are seen.  CT MAXILLOFACIAL FINDINGS  There is no evidence of fracture or dislocation. The maxilla and mandible appear intact. The nasal bone is unremarkable in appearance. The visualized dentition demonstrates no acute abnormality.  The orbits are intact bilaterally. There is complete opacification of the right mastoid air cells, and partial opacification of the left mastoid air cells. The visualized paranasal sinuses are well-aerated.  There is suggestion of slightly asymmetric right eyelid thickening, which could reflect mild preseptal cellulitis. The right optic globe remains intact. There is no evidence of postseptal extension of inflammation. The parapharyngeal fat planes are preserved. The nasopharynx, oropharynx and hypopharynx are unremarkable in appearance. The visualized portions of the valleculae and piriform sinuses are grossly unremarkable.  Incidental note is made of retropharyngeal common carotid arteries bilaterally, with mild calcification at the right carotid bifurcation.  The parotid and submandibular glands are within normal limits. No cervical lymphadenopathy is seen.  IMPRESSION: 1. Suggestion of slightly asymmetric right eyelid thickening, which could reflect mild preseptal cellulitis. The right orbit is unremarkable in appearance. 2. Complete opacification of the right mastoid air cells, and partial opacification of the left mastoid air cells. 3. No acute intracranial pathology seen on CT. 4. Diffuse small vessel ischemic microangiopathy. 5. Mild cortical volume loss. 6. 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   Electronically Signed   By: Garald Balding M.D.   On: 02/04/2014 01:50   Ct Maxillofacial Wo Cm  02/04/2014   CLINICAL DATA:  Sudden onset of right-sided visual loss.  EXAM: CT HEAD WITHOUT CONTRAST  CT MAXILLOFACIAL WITHOUT  CONTRAST  TECHNIQUE: Multidetector CT imaging of the head and maxillofacial structures were performed using the standard protocol without intravenous contrast. Multiplanar CT image reconstructions of the maxillofacial structures were also generated.  COMPARISON:  CT of the head performed 12/09/2012, and MRI of the brain from 12/06/2012  FINDINGS: CT HEAD FINDINGS  There is no evidence of acute infarction, mass lesion, or intra- or extra-axial hemorrhage on CT.  Diffuse periventricular and subcortical white matter change likely reflects small vessel ischemic microangiopathy. A small chronic infarct is noted in the right cerebellar hemisphere. Prominence of the ventricles and sulci suggests mild cortical volume loss.  The brainstem and fourth ventricle are within normal limits. The basal ganglia are unremarkable in appearance. The cerebral hemispheres demonstrate grossly normal gray-white differentiation. No mass effect or midline shift is seen.  There is no evidence of fracture; visualized osseous structures are unremarkable in appearance. There is suggestion of mild eyelid thickening on the right side, which could reflect mild preseptal cellulitis. The orbits are within normal limits. There is complete opacification of the right mastoid air cells, and partial opacification of the left mastoid air cells. The paranasal sinuses are well-aerated. No significant soft tissue abnormalities are seen.  CT MAXILLOFACIAL FINDINGS  There is no evidence of fracture or dislocation. The maxilla and mandible appear intact. The nasal bone is unremarkable in appearance. The visualized dentition demonstrates no acute abnormality.  The orbits are intact bilaterally. There is complete opacification of the right mastoid air cells, and partial opacification of the  left mastoid air cells. The visualized paranasal sinuses are well-aerated.  There is suggestion of slightly asymmetric right eyelid thickening, which could reflect mild  preseptal cellulitis. The right optic globe remains intact. There is no evidence of postseptal extension of inflammation. The parapharyngeal fat planes are preserved. The nasopharynx, oropharynx and hypopharynx are unremarkable in appearance. The visualized portions of the valleculae and piriform sinuses are grossly unremarkable.  Incidental note is made of retropharyngeal common carotid arteries bilaterally, with mild calcification at the right carotid bifurcation.  The parotid and submandibular glands are within normal limits. No cervical lymphadenopathy is seen.  IMPRESSION: 1. Suggestion of slightly asymmetric right eyelid thickening, which could reflect mild preseptal cellulitis. The right orbit is unremarkable in appearance. 2. Complete opacification of the right mastoid air cells, and partial opacification of the left mastoid air cells. 3. No acute intracranial pathology seen on CT. 4. Diffuse small vessel ischemic microangiopathy. 5. Mild cortical volume loss. 6. 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   Electronically Signed   By: Garald Balding M.D.   On: 02/04/2014 01:50     EKG Interpretation None      MDM   Final diagnoses:  None    [name] is a 68 y.o. female here with eye pain, conjunctivitis, subjective fevers. Will do sepsis workup. Will get CT maxillofacial to r/o orbital cellulitis. Given hx of endocarditis, will get cultures.   2:11 AM Labs showed acute renal failure. CT showed preseptal cellulitis. Also CBG persistently low, likely from sepsis. Given d50. Given clinda, cultures sent. Will admit.   Wandra Arthurs, MD 02/04/14 [phone]

## 2014-02-04 ENCOUNTER — Encounter (HOSPITAL_COMMUNITY): Payer: Self-pay

## 2014-02-04 ENCOUNTER — Emergency Department (HOSPITAL_COMMUNITY): Payer: Medicare Other

## 2014-02-04 DIAGNOSIS — L0390 Cellulitis, unspecified: Secondary | ICD-10-CM | POA: Diagnosis present

## 2014-02-04 DIAGNOSIS — N183 Chronic kidney disease, stage 3 unspecified: Secondary | ICD-10-CM | POA: Diagnosis present

## 2014-02-04 DIAGNOSIS — F3289 Other specified depressive episodes: Secondary | ICD-10-CM | POA: Diagnosis present

## 2014-02-04 DIAGNOSIS — E039 Hypothyroidism, unspecified: Secondary | ICD-10-CM | POA: Diagnosis present

## 2014-02-04 DIAGNOSIS — I509 Heart failure, unspecified: Secondary | ICD-10-CM | POA: Diagnosis present

## 2014-02-04 DIAGNOSIS — F329 Major depressive disorder, single episode, unspecified: Secondary | ICD-10-CM | POA: Diagnosis present

## 2014-02-04 DIAGNOSIS — Z9849 Cataract extraction status, unspecified eye: Secondary | ICD-10-CM | POA: Diagnosis not present

## 2014-02-04 DIAGNOSIS — Z87891 Personal history of nicotine dependence: Secondary | ICD-10-CM | POA: Diagnosis not present

## 2014-02-04 DIAGNOSIS — E785 Hyperlipidemia, unspecified: Secondary | ICD-10-CM | POA: Diagnosis present

## 2014-02-04 DIAGNOSIS — N039 Chronic nephritic syndrome with unspecified morphologic changes: Secondary | ICD-10-CM | POA: Diagnosis present

## 2014-02-04 DIAGNOSIS — Z79899 Other long term (current) drug therapy: Secondary | ICD-10-CM | POA: Diagnosis not present

## 2014-02-04 DIAGNOSIS — K219 Gastro-esophageal reflux disease without esophagitis: Secondary | ICD-10-CM | POA: Diagnosis present

## 2014-02-04 DIAGNOSIS — N189 Chronic kidney disease, unspecified: Secondary | ICD-10-CM

## 2014-02-04 DIAGNOSIS — Z7902 Long term (current) use of antithrombotics/antiplatelets: Secondary | ICD-10-CM | POA: Diagnosis not present

## 2014-02-04 DIAGNOSIS — N179 Acute kidney failure, unspecified: Principal | ICD-10-CM

## 2014-02-04 DIAGNOSIS — D72829 Elevated white blood cell count, unspecified: Secondary | ICD-10-CM | POA: Diagnosis present

## 2014-02-04 DIAGNOSIS — Z8673 Personal history of transient ischemic attack (TIA), and cerebral infarction without residual deficits: Secondary | ICD-10-CM | POA: Diagnosis not present

## 2014-02-04 DIAGNOSIS — L03211 Cellulitis of face: Secondary | ICD-10-CM | POA: Diagnosis present

## 2014-02-04 DIAGNOSIS — E1129 Type 2 diabetes mellitus with other diabetic kidney complication: Secondary | ICD-10-CM | POA: Diagnosis present

## 2014-02-04 DIAGNOSIS — I129 Hypertensive chronic kidney disease with stage 1 through stage 4 chronic kidney disease, or unspecified chronic kidney disease: Secondary | ICD-10-CM | POA: Diagnosis present

## 2014-02-04 DIAGNOSIS — H00039 Abscess of eyelid unspecified eye, unspecified eyelid: Secondary | ICD-10-CM

## 2014-02-04 DIAGNOSIS — M129 Arthropathy, unspecified: Secondary | ICD-10-CM | POA: Diagnosis present

## 2014-02-04 DIAGNOSIS — I2510 Atherosclerotic heart disease of native coronary artery without angina pectoris: Secondary | ICD-10-CM | POA: Diagnosis present

## 2014-02-04 DIAGNOSIS — H16009 Unspecified corneal ulcer, unspecified eye: Secondary | ICD-10-CM | POA: Diagnosis present

## 2014-02-04 DIAGNOSIS — I5032 Chronic diastolic (congestive) heart failure: Secondary | ICD-10-CM | POA: Diagnosis present

## 2014-02-04 DIAGNOSIS — H5789 Other specified disorders of eye and adnexa: Secondary | ICD-10-CM | POA: Diagnosis present

## 2014-02-04 DIAGNOSIS — N058 Unspecified nephritic syndrome with other morphologic changes: Secondary | ICD-10-CM | POA: Diagnosis present

## 2014-02-04 DIAGNOSIS — I252 Old myocardial infarction: Secondary | ICD-10-CM | POA: Diagnosis not present

## 2014-02-04 DIAGNOSIS — H109 Unspecified conjunctivitis: Secondary | ICD-10-CM | POA: Diagnosis present

## 2014-02-04 DIAGNOSIS — I10 Essential (primary) hypertension: Secondary | ICD-10-CM | POA: Diagnosis present

## 2014-02-04 DIAGNOSIS — L03213 Periorbital cellulitis: Secondary | ICD-10-CM | POA: Diagnosis present

## 2014-02-04 DIAGNOSIS — Z794 Long term (current) use of insulin: Secondary | ICD-10-CM | POA: Diagnosis not present

## 2014-02-04 DIAGNOSIS — G4733 Obstructive sleep apnea (adult) (pediatric): Secondary | ICD-10-CM | POA: Diagnosis present

## 2014-02-04 DIAGNOSIS — D638 Anemia in other chronic diseases classified elsewhere: Secondary | ICD-10-CM | POA: Diagnosis present

## 2014-02-04 DIAGNOSIS — I2789 Other specified pulmonary heart diseases: Secondary | ICD-10-CM | POA: Diagnosis present

## 2014-02-04 DIAGNOSIS — Z961 Presence of intraocular lens: Secondary | ICD-10-CM | POA: Diagnosis not present

## 2014-02-04 DIAGNOSIS — D631 Anemia in chronic kidney disease: Secondary | ICD-10-CM | POA: Diagnosis present

## 2014-02-04 LAB — COMPREHENSIVE METABOLIC PANEL
ALK PHOS: 45 U/L (ref 39–117)
ALT: 23 U/L (ref 0–35)
ALT: 21 U/L (ref 0–35)
ANION GAP: 18 — AB (ref 5–15)
AST: 29 U/L (ref 0–37)
AST: 26 U/L (ref 0–37)
Albumin: 3.4 g/dL — ABNORMAL LOW (ref 3.5–5.2)
Albumin: 3.1 g/dL — ABNORMAL LOW (ref 3.5–5.2)
Alkaline Phosphatase: 43 U/L (ref 39–117)
Anion gap: 17 — ABNORMAL HIGH (ref 5–15)
BUN: 77 mg/dL — AB (ref 6–23)
BUN: 71 mg/dL — AB (ref 6–23)
CALCIUM: 9.4 mg/dL (ref 8.4–10.5)
CALCIUM: 8.6 mg/dL (ref 8.4–10.5)
CO2: 17 mEq/L — ABNORMAL LOW (ref 19–32)
CO2: 17 mEq/L — ABNORMAL LOW (ref 19–32)
Chloride: 104 mEq/L (ref 96–112)
Chloride: 104 mEq/L (ref 96–112)
Creatinine, Ser: 2.45 mg/dL — ABNORMAL HIGH (ref 0.50–1.10)
Creatinine, Ser: 2.25 mg/dL — ABNORMAL HIGH (ref 0.50–1.10)
GFR calc non Af Amer: 19 mL/min — ABNORMAL LOW (ref >90)
GFR calc non Af Amer: 22 mL/min — ABNORMAL LOW (ref >90)
GFR, EST AFRICAN AMERICAN: 23 mL/min — AB (ref >90)
GFR, EST AFRICAN AMERICAN: 25 mL/min — AB (ref >90)
GLUCOSE: 146 mg/dL — AB (ref 70–99)
Glucose, Bld: 68 mg/dL — ABNORMAL LOW (ref 70–99)
Potassium: 3.9 mEq/L (ref 3.7–5.3)
Potassium: 4.9 mEq/L (ref 3.7–5.3)
Sodium: 139 mEq/L (ref 137–147)
Sodium: 138 mEq/L (ref 137–147)
TOTAL PROTEIN: 7.7 g/dL (ref 6.0–8.3)
TOTAL PROTEIN: 7.1 g/dL (ref 6.0–8.3)
Total Bilirubin: 0.3 mg/dL (ref 0.3–1.2)
Total Bilirubin: 0.4 mg/dL (ref 0.3–1.2)

## 2014-02-04 LAB — URINALYSIS, ROUTINE W REFLEX MICROSCOPIC
Bilirubin Urine: NEGATIVE
Bilirubin Urine: NEGATIVE
GLUCOSE, UA: NEGATIVE mg/dL
GLUCOSE, UA: NEGATIVE mg/dL
Hgb urine dipstick: NEGATIVE
Hgb urine dipstick: NEGATIVE
Ketones, ur: NEGATIVE mg/dL
Ketones, ur: NEGATIVE mg/dL
LEUKOCYTES UA: NEGATIVE
Leukocytes, UA: NEGATIVE
NITRITE: NEGATIVE
NITRITE: NEGATIVE
PH: 5.5 (ref 5.0–8.0)
PROTEIN: 30 mg/dL — AB
Protein, ur: 30 mg/dL — AB
SPECIFIC GRAVITY, URINE: 1.011 (ref 1.005–1.030)
Specific Gravity, Urine: 1.012 (ref 1.005–1.030)
Urobilinogen, UA: 0.2 mg/dL (ref 0.0–1.0)
Urobilinogen, UA: 0.2 mg/dL (ref 0.0–1.0)
pH: 6 (ref 5.0–8.0)

## 2014-02-04 LAB — HEMOGLOBIN A1C
Hgb A1c MFr Bld: 7 % — ABNORMAL HIGH (ref <5.7)
MEAN PLASMA GLUCOSE: 154 mg/dL — AB (ref <117)

## 2014-02-04 LAB — GLUCOSE, CAPILLARY
GLUCOSE-CAPILLARY: 170 mg/dL — AB (ref 70–99)
Glucose-Capillary: 190 mg/dL — ABNORMAL HIGH (ref 70–99)

## 2014-02-04 LAB — URINE MICROSCOPIC-ADD ON

## 2014-02-04 LAB — CBG MONITORING, ED
GLUCOSE-CAPILLARY: 69 mg/dL — AB (ref 70–99)
Glucose-Capillary: 135 mg/dL — ABNORMAL HIGH (ref 70–99)
Glucose-Capillary: 146 mg/dL — ABNORMAL HIGH (ref 70–99)

## 2014-02-04 LAB — TSH: TSH: 10.69 u[IU]/mL — AB (ref 0.350–4.500)

## 2014-02-04 LAB — CBC
HCT: 33.8 % — ABNORMAL LOW (ref 36.0–46.0)
HEMOGLOBIN: 11.3 g/dL — AB (ref 12.0–15.0)
MCH: 31 pg (ref 26.0–34.0)
MCHC: 33.4 g/dL (ref 30.0–36.0)
MCV: 92.6 fL (ref 78.0–100.0)
PLATELETS: 259 10*3/uL (ref 150–400)
RBC: 3.65 MIL/uL — AB (ref 3.87–5.11)
RDW: 15.9 % — ABNORMAL HIGH (ref 11.5–15.5)
WBC: 7.9 10*3/uL (ref 4.0–10.5)

## 2014-02-04 MED ORDER — ISOSORBIDE MONONITRATE ER 60 MG PO TB24
60.0000 mg | ORAL_TABLET | Freq: Every day | ORAL | Status: DC
  Administered 2014-02-04: 60 mg via ORAL
  Filled 2014-02-04 (×2): qty 1

## 2014-02-04 MED ORDER — NON FORMULARY: 1.0000 "application " | Status: DC

## 2014-02-04 MED ORDER — CLINDAMYCIN PHOSPHATE 600 MG/50ML IV SOLN
600.0000 mg | Freq: Once | INTRAVENOUS | Status: AC
  Administered 2014-02-04: 600 mg via INTRAVENOUS
  Filled 2014-02-04: qty 50

## 2014-02-04 MED ORDER — INSULIN ASPART 100 UNIT/ML ~~LOC~~ SOLN
28.0000 [IU] | Freq: Three times a day (TID) | SUBCUTANEOUS | Status: DC
  Administered 2014-02-04: 28 [IU] via SUBCUTANEOUS
  Filled 2014-02-04: qty 1

## 2014-02-04 MED ORDER — OXYCODONE-ACETAMINOPHEN 5-325 MG PO TABS
0.5000 | ORAL_TABLET | Freq: Four times a day (QID) | ORAL | Status: DC | PRN
  Administered 2014-02-04 – 2014-02-05 (×2): 1 via ORAL
  Filled 2014-02-04 (×3): qty 1

## 2014-02-04 MED ORDER — HYDRALAZINE HCL 100 MG PO TABS
100.0000 mg | ORAL_TABLET | Freq: Three times a day (TID) | ORAL | Status: DC
  Administered 2014-02-04 – 2014-02-05 (×4): 100 mg via ORAL
  Filled 2014-02-04 (×6): qty 1

## 2014-02-04 MED ORDER — ONDANSETRON HCL 4 MG/2ML IJ SOLN
4.0000 mg | Freq: Once | INTRAMUSCULAR | Status: AC
Start: 2014-02-04 — End: 2014-02-04
  Administered 2014-02-04: 4 mg via INTRAVENOUS
  Filled 2014-02-04: qty 2

## 2014-02-04 MED ORDER — DEXTROSE 50 % IV SOLN
50.0000 mL | Freq: Once | INTRAVENOUS | Status: DC
  Filled 2014-02-04: qty 50

## 2014-02-04 MED ORDER — SODIUM CHLORIDE 0.9 % IV SOLN
INTRAVENOUS | Status: DC
  Administered 2014-02-04: 15:00:00 via INTRAVENOUS

## 2014-02-04 MED ORDER — ONDANSETRON HCL 4 MG PO TABS
4.0000 mg | ORAL_TABLET | Freq: Four times a day (QID) | ORAL | Status: DC | PRN
  Administered 2014-02-04 – 2014-02-05 (×2): 4 mg via ORAL
  Filled 2014-02-04 (×2): qty 1

## 2014-02-04 MED ORDER — ALPRAZOLAM 0.5 MG PO TABS
0.5000 mg | ORAL_TABLET | Freq: Two times a day (BID) | ORAL | Status: DC
  Administered 2014-02-04 – 2014-02-05 (×2): 0.5 mg via ORAL
  Filled 2014-02-04 (×2): qty 1

## 2014-02-04 MED ORDER — DIPHENOXYLATE-ATROPINE 2.5-0.025 MG PO TABS: 1.0000 | ORAL_TABLET | Freq: Two times a day (BID) | ORAL | Status: DC | PRN

## 2014-02-04 MED ORDER — INSULIN ASPART 100 UNIT/ML ~~LOC~~ SOLN
22.0000 [IU] | Freq: Three times a day (TID) | SUBCUTANEOUS | Status: DC
  Administered 2014-02-04 – 2014-02-05 (×2): 22 [IU] via SUBCUTANEOUS

## 2014-02-04 MED ORDER — LEVOTHYROXINE SODIUM 112 MCG PO TABS
112.0000 ug | ORAL_TABLET | Freq: Every morning | ORAL | Status: DC
  Administered 2014-02-04 – 2014-02-05 (×2): 112 ug via ORAL
  Filled 2014-02-04 (×2): qty 1

## 2014-02-04 MED ORDER — SODIUM CHLORIDE 0.9 % IV SOLN
INTRAVENOUS | Status: DC
  Administered 2014-02-04: 07:00:00 via INTRAVENOUS

## 2014-02-04 MED ORDER — VITAMIN D 1000 UNITS PO TABS
2000.0000 [IU] | ORAL_TABLET | Freq: Every evening | ORAL | Status: DC
  Administered 2014-02-04: 2000 [IU] via ORAL
  Filled 2014-02-04 (×2): qty 2

## 2014-02-04 MED ORDER — ESCITALOPRAM OXALATE 20 MG PO TABS
20.0000 mg | ORAL_TABLET | Freq: Two times a day (BID) | ORAL | Status: DC
  Administered 2014-02-04 – 2014-02-05 (×3): 20 mg via ORAL
  Filled 2014-02-04 (×4): qty 1

## 2014-02-04 MED ORDER — ESCITALOPRAM OXALATE 20 MG PO TABS
20.0000 mg | ORAL_TABLET | Freq: Two times a day (BID) | ORAL | Status: DC
  Filled 2014-02-04 (×2): qty 1

## 2014-02-04 MED ORDER — ALPRAZOLAM 0.5 MG PO TABS
0.5000 mg | ORAL_TABLET | Freq: Once | ORAL | Status: AC
  Administered 2014-02-04: 0.5 mg via ORAL

## 2014-02-04 MED ORDER — CLINDAMYCIN PHOSPHATE 300 MG/50ML IV SOLN
300.0000 mg | Freq: Four times a day (QID) | INTRAVENOUS | Status: DC
  Administered 2014-02-04 – 2014-02-05 (×6): 300 mg via INTRAVENOUS
  Filled 2014-02-04 (×9): qty 50

## 2014-02-04 MED ORDER — INSULIN GLARGINE 100 UNIT/ML ~~LOC~~ SOLN
60.0000 [IU] | Freq: Every day | SUBCUTANEOUS | Status: DC
  Administered 2014-02-04: 60 [IU] via SUBCUTANEOUS
  Filled 2014-02-04 (×2): qty 0.6

## 2014-02-04 MED ORDER — CLOPIDOGREL BISULFATE 75 MG PO TABS
75.0000 mg | ORAL_TABLET | Freq: Every day | ORAL | Status: DC
  Administered 2014-02-04 – 2014-02-05 (×2): 75 mg via ORAL
  Filled 2014-02-04 (×3): qty 1

## 2014-02-04 MED ORDER — OMEGA-3-ACID ETHYL ESTERS 1 G PO CAPS
1.0000 g | ORAL_CAPSULE | Freq: Two times a day (BID) | ORAL | Status: DC
  Administered 2014-02-04 – 2014-02-05 (×2): 1 g via ORAL
  Filled 2014-02-04 (×4): qty 1

## 2014-02-04 MED ORDER — NEBIVOLOL HCL 5 MG PO TABS
5.0000 mg | ORAL_TABLET | Freq: Every morning | ORAL | Status: DC
  Administered 2014-02-04 – 2014-02-05 (×2): 5 mg via ORAL
  Filled 2014-02-04 (×2): qty 1

## 2014-02-04 MED ORDER — NONFORMULARY OR COMPOUNDED ITEM
1.0000 "application " | Status: DC
  Administered 2014-02-04 – 2014-02-05 (×14): 1 via OPHTHALMIC
  Filled 2014-02-04: qty 1

## 2014-02-04 MED ORDER — FISH OIL TRIPLE STRENGTH 1400 MG PO CAPS: 2.0000 | ORAL_CAPSULE | Freq: Two times a day (BID) | ORAL | Status: DC

## 2014-02-04 MED ORDER — ACETAMINOPHEN 325 MG PO TABS: 650.0000 mg | ORAL_TABLET | Freq: Four times a day (QID) | ORAL | Status: DC | PRN

## 2014-02-04 MED ORDER — BACITRACIN-POLYMYXIN B 500-10000 UNIT/GM OP OINT: 1.0000 "application " | TOPICAL_OINTMENT | OPHTHALMIC | Status: DC

## 2014-02-04 MED ORDER — NIACIN ER (ANTIHYPERLIPIDEMIC) 500 MG PO TBCR
500.0000 mg | EXTENDED_RELEASE_TABLET | Freq: Two times a day (BID) | ORAL | Status: DC
  Administered 2014-02-04 – 2014-02-05 (×3): 500 mg via ORAL
  Filled 2014-02-04 (×5): qty 1

## 2014-02-04 MED ORDER — ALPRAZOLAM 0.5 MG PO TABS
1.0000 mg | ORAL_TABLET | Freq: Two times a day (BID) | ORAL | Status: DC
  Filled 2014-02-04: qty 2

## 2014-02-04 MED ORDER — INSULIN ASPART 100 UNIT/ML ~~LOC~~ SOLN
0.0000 [IU] | Freq: Three times a day (TID) | SUBCUTANEOUS | Status: DC
  Administered 2014-02-04: 2 [IU] via SUBCUTANEOUS

## 2014-02-04 MED ORDER — POTASSIUM CHLORIDE CRYS ER 20 MEQ PO TBCR
20.0000 meq | EXTENDED_RELEASE_TABLET | Freq: Every day | ORAL | Status: DC
  Administered 2014-02-04 – 2014-02-05 (×2): 20 meq via ORAL
  Filled 2014-02-04 (×3): qty 1

## 2014-02-04 MED ORDER — CO Q-10 50 MG PO CAPS: 50.0000 mg | ORAL_CAPSULE | Freq: Every day | ORAL | Status: DC

## 2014-02-04 MED ORDER — BACITRACIN 500 UNIT/GM OP OINT
TOPICAL_OINTMENT | Freq: Every day | OPHTHALMIC | Status: DC
  Administered 2014-02-04: 1 via OPHTHALMIC

## 2014-02-04 MED ORDER — ONDANSETRON HCL 4 MG/2ML IJ SOLN: 4.0000 mg | Freq: Four times a day (QID) | INTRAMUSCULAR | Status: DC | PRN

## 2014-02-04 MED ORDER — MORPHINE SULFATE 2 MG/ML IJ SOLN: 1.0000 mg | INTRAMUSCULAR | Status: DC | PRN

## 2014-02-04 MED ORDER — ATROPINE SULFATE 1 % OP OINT
TOPICAL_OINTMENT | Freq: Every day | OPHTHALMIC | Status: DC
  Filled 2014-02-04: qty 3.5

## 2014-02-04 MED ORDER — BACITRACIN-POLYMYXIN B 500-10000 UNIT/GM OP OINT
1.0000 "application " | TOPICAL_OINTMENT | Freq: Every day | OPHTHALMIC | Status: DC
  Filled 2014-02-04: qty 3.5

## 2014-02-04 MED ORDER — BIOTIN 5000 MCG PO CAPS: 5000.0000 ug | ORAL_CAPSULE | Freq: Every day | ORAL | Status: DC

## 2014-02-04 MED ORDER — CLONIDINE HCL 0.3 MG/24HR TD PTWK: 0.3000 mg | MEDICATED_PATCH | TRANSDERMAL | Status: DC

## 2014-02-04 MED ORDER — ALPRAZOLAM ER 1 MG PO TB24: 1.0000 mg | ORAL_TABLET | Freq: Every day | ORAL | Status: DC

## 2014-02-04 MED ORDER — ACETAMINOPHEN 650 MG RE SUPP: 650.0000 mg | Freq: Four times a day (QID) | RECTAL | Status: DC | PRN

## 2014-02-04 MED ORDER — KETOROLAC TROMETHAMINE 0.5 % OP SOLN: 1.0000 [drp] | Freq: Four times a day (QID) | OPHTHALMIC | Status: DC

## 2014-02-04 MED ORDER — NONFORMULARY OR COMPOUNDED ITEM
1.0000 "application " | Status: DC
  Administered 2014-02-04 – 2014-02-05 (×14): 1 via OPHTHALMIC
  Filled 2014-02-04 (×2): qty 1

## 2014-02-04 MED ORDER — ATROPINE SULFATE 1 % OP SOLN
1.0000 [drp] | Freq: Every day | OPHTHALMIC | Status: DC
  Administered 2014-02-04: 1 [drp] via OPHTHALMIC
  Filled 2014-02-04: qty 5

## 2014-02-04 MED ORDER — HYDROMORPHONE HCL PF 1 MG/ML IJ SOLN
1.0000 mg | Freq: Once | INTRAMUSCULAR | Status: AC
  Administered 2014-02-04: 1 mg via INTRAVENOUS
  Filled 2014-02-04: qty 1

## 2014-02-04 NOTE — Progress Notes (Signed)
Pt seen and examined, admitted earlier this am per Dr.Devine 68/F with severe Conjunctivitis/corneal ulcer with decreased vision for 2 weeks, seen by Jene Every at [facility], [place] and [facility]. Admitted with severe eye pain and nausea, also found to have AKI on CKD. She has chronic medical problems, DM, CKD 3, chronic diastolic CHF, PUlm HTN, RAS s/p stenting, HTN.  1. AKI on CKD -improving with hydration, baseline around 1.6 -continue IVF today, hold ARB and lasix, hopefully DC home tomorrow off ARB  2. Severe Conjunctivitis of R eye with corneal ulcer -had maxillofacial CT which revealed mild R eyelid swelling, no peri-orbital abscess -was seen by Dr.Cohen yesterday in office and started on Vanc/Tobra eye drops Q1h, bacitracin ointment, atropine and Po Doxycycline. - I called her office, she is out of town today. -i d/w her partner dr.Groat and discussed admission and CT findings, they felt there is no need for her to be admitted for her ocular problem, he felt she should continue the eye drops as prescribed and should FU with them tomorrow, she has an appt at 2:30pm at [facility] care on [address]. -I told him that she should most likely be discharged tomorrow if creatinine better and be able to make appt tomorrow  Domenic Polite, [place]

## 2014-02-04 NOTE — Progress Notes (Signed)
UR complete Edwyna Shell, RN, BSN, Case Manager [phone] 02/04/2014 9:04 AM

## 2014-02-04 NOTE — ED Notes (Signed)
Report given to rn on 6e 

## 2014-02-04 NOTE — ED Notes (Signed)
Waiting for bed assignment. Director made aware of delay, also AC. [employer], checking on meal, reports will get it to the floor. Pt made aware, offered snacks, refused. Doesn't want meds until meal arrives.

## 2014-02-04 NOTE — ED Notes (Signed)
Iv infiltrated approx 30 minutes delaying the cleocin infusion.  Iv restarted and the med is in  now

## 2014-02-04 NOTE — ED Notes (Signed)
Pt states she is unable to urinate at this time.

## 2014-02-04 NOTE — ED Notes (Signed)
Pt going to ct now.

## 2014-02-04 NOTE — Progress Notes (Signed)
Admission note:  Arrival Method: Patient came from ED on a stretcher with a staff member and husband accompanying her. Mental Orientation:  Alert and oriented x 4. Assessment: See Doc flowsheets Skin: Warm, dry and intact. IV: Right forearm peripheral IV infusing with NS 75 ml/hr Pain: No pain voiced at present, except for Right eye discomfort. Safety Measures:  Fall prevention safety plan explained to patient and she understood and acknowledged it. Admission Screening: In progress.  6700 Orientation: Patient has been oriented to the unit, staff and to the room.

## 2014-02-04 NOTE — H&P (Signed)
[facility] History and Physical  BAANI BOBER GYK:[id number] DOB: 1945-07-08 DOA: 02/03/2014  Referring physician: ER physician PCP: Lanette Hampshire, MD   Chief Complaint: Right eye redness, pain  HPI:  68 year old female with multiple medical comorbidities including but not limited to diabetes mellitus with renal complications, chronic kidney disease stage III (baseline creatinine 1 year ago 1.57), chronic diastolic CHF grade 3 (last 2D echo in July 2014 with normal ejection fraction), hypothyroidism, dyslipidemia, renal artery stenosis status post renal artery stenting, depression, hypertension who presented to [facility] ED [phone] with worsening redness and pain in right eye. Patient was seen by a physician 3 days prior to this admission and was given eyedrops which were custom made in local pharmacy as she is allergic to so many medications. She reports no specific trauma to the eye. 2 days prior to this admission her eye started to become more red and she had hard time opening the eye. She also reported associated pain, 5/10 in intensity. No fevers or chills. No discharge from the eye. No reports of visual loss. No problems in the left eye. In ED, vital signs are stable with blood pressure 111/35, heart rate 82, T max 99 F and oxygen saturation 96% on room air. Blood work showed mild leukocytosis of 11.2, hemoglobin 11.7, creatinine 2.45. CT head and maxillofacial CT were significant for right eyelid thickening which could reflect mild pre-septal cellulitis. Patient was started on clindamycin in ED.  Assessment & Plan    Principal Problem:   Preseptal cellulitis / Leukocytosis  Unclear what is provoking event that led to cellulitis. Based on maxillofacial CT and CT head there is a possibility of possible mild preseptal cellulitis.  Continue clindamycin for now.  Supportive care with analgesia if needed for pain control. Active Problems:   HYPOTHYROIDISM  Check TSH. Restart  levothyroxine.   HYPERLIPIDEMIA, hx of statin induced myositis  Continue omega-3, niacin and coenzyme Q10 supplementation   Diabetes mellitus with renal complications  Check S9Q. Restart home medications which include insulin 20 units in a.m., 20 units with lunch and 34 units at supper and then Lantus 60 units at bedtime.  Start sliding scale insulin.   Chronic renal insufficiency, stage III (moderate) / RAS (renal artery stenosis), s/p renal artery PTA X 2  Creatinine is 2.45 on this admission which is higher than baseline 1 year ago, 1.57   Patient is on Lasix and Avapro which is put on hold.  Will give IV fluids for 12 hours and reassess BMP in a.m.   Depression  Continue Lexapro   Chronic diastolic heart failure- grade 3 by echo July 2014  Stable, compensated.   HTN (hypertension)  Continue hydralazine, Imdur, nebivolol; hold lasix and avapro due to renal insufficiency    Anemia of chronic disease  Secondary to history of chronic kidney disease  Hemoglobin is 11.7. No current indication for transfusion.   DVT prophylaxis:   SCDs bilaterally  Radiological Exams on Admission:  Dg Chest 2 View 02/04/2014  No acute cardiopulmonary process seen.     Ct Head Wo Contrast 02/04/2014  1. Suggestion of slightly asymmetric right eyelid thickening, which could reflect mild preseptal cellulitis. The right orbit is unremarkable in appearance. 2. Complete opacification of the right mastoid air cells, and partial opacification of the left mastoid air cells. 3. No acute intracranial pathology seen on CT. 4. Diffuse small vessel ischemic microangiopathy. 5. Mild cortical volume loss. 6. 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  Ct Maxillofacial Wo Cm 02/04/2014  1. Suggestion of slightly asymmetric right eyelid thickening, which could reflect mild preseptal cellulitis. The right orbit is unremarkable in appearance. 2. Complete opacification of the right mastoid air  cells, and partial opacification of the left mastoid air cells. 3. No acute intracranial pathology seen on CT. 4. Diffuse small vessel ischemic microangiopathy. 5. Mild cortical volume loss. 6. 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      Code Status: Full Family Communication: Plan of care discussed with the patient  Disposition Plan: Admit for further evaluation  Leisa Lenz, MD  [facility] Pager [phone]  Review of Systems:  Constitutional: Negative for fever, chills and malaise/fatigue. Negative for diaphoresis.  HENT: Negative for hearing loss, ear pain, nosebleeds, congestion, sore throat, neck pain, tinnitus and ear discharge.   Eyes: per HPI Respiratory: Negative for cough, hemoptysis, sputum production, shortness of breath, wheezing and stridor.   Cardiovascular: Negative for chest pain, palpitations, orthopnea, claudication and leg swelling.  Gastrointestinal: Negative for nausea, vomiting and abdominal pain. Negative for heartburn, constipation, blood in stool and melena.  Genitourinary: Negative for dysuria, urgency, frequency, hematuria and flank pain.  Musculoskeletal: Negative for myalgias, back pain, joint pain and falls.  Skin: Negative for itching and rash.  Neurological: Negative for dizziness and weakness. Negative for tingling, tremors, sensory change, speech change, focal weakness, loss of consciousness and headaches.  Endo/Heme/Allergies: Negative for environmental allergies and polydipsia. Does not bruise/bleed easily.  Psychiatric/Behavioral: Negative for suicidal ideas. The patient is not nervous/anxious.      Past Medical History  Diagnosis Date  . HTN (hypertension)   . Elevated troponin level 09/28/11    Type II MI - not ACS related  . Diastolic dysfunction, left ventricle     with previous Diastolic HF  . Diabetes mellitus   . High cholesterol   . Kidney disease   . Cataracts, bilateral   . 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 Pulmonary HTN-severe- due to diastolic dysfunction 01/26/1883    Moderate to Severe Pulm HTN.  Marland Kitchen Acute renal failure 09/29/2011  . 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 HYPOTHYROIDISM 06/19/2008    Qualifier: Diagnosis of  By: Burt Knack CMA, Cecille Rubin    . Renal artery stenosis     s/p stents (restenosis)  . Subclavian arterial stenosis   . Peripheral neuropathy   . Coronary artery disease   . Myocardial infarction   . Pericarditis   . Anxiety   . Depression   . Shortness of breath   . CHF (congestive heart failure)   . GERD (gastroesophageal reflux disease)   . Obstructive sleep apnea on nocturnal CPAP 06/20/2008    AHI-1.0/hr, AHI REM-7.5/hr  . Unstable angina   . Stroke   . Arthritis   . Vertigo   . Complication of anesthesia   . PONV (postoperative nausea and vomiting)   . Family history of anesthesia complication     MOTHER ALSO HAD NAUSEA  . Blood clot in abdominal vein 01/09/2013  . History of nuclear stress test 10/10/2011    lexiscan; no perfusion defects    Past Surgical History  Procedure Laterality Date  . Cardiac catheterization  01/2011    Minimal luminal irregularties  . Laparoscopic tubal ligation    . Renal artery stent Bilateral     x 2 (restenosis intervention with iCAST covered stents)  . Colonoscopy  10/12/2011    Procedure: COLONOSCOPY;  Surgeon: Herbie Baltimore  Shaaron Adler, MD;  Location: [place];  Service: Endoscopy;  Laterality: N/A;  . Esophagogastroduodenoscopy  10/12/2011    Procedure: ESOPHAGOGASTRODUODENOSCOPY (EGD);  Surgeon: Inda Castle, MD;  Location: [place];  Service: Endoscopy;  Laterality: N/A;  . Tubal ligation    . Colonoscopy  10/21/2011    Procedure: COLONOSCOPY;  Surgeon: Irene Shipper, MD;  Location: [place];  Service: Endoscopy;  Laterality: N/A;  . Colonoscopy  10/25/2011    Procedure: COLONOSCOPY;  Surgeon: Jerene Bears, MD;  Location: [place];   Service: Gastroenterology;  Laterality: N/A;  . Colonoscopy  10/23/2011    Procedure: COLONOSCOPY;  Surgeon: Irene Shipper, MD;  Location: [place];  Service: Endoscopy;  Laterality: N/A;  . Colon surgery    . Back surgery       x 2  . Cataract extraction w/phaco  03/29/2012    Procedure: CATARACT EXTRACTION PHACO AND INTRAOCULAR LENS PLACEMENT (IOC);  Surgeon: Tonny Branch, MD;  Location: [facility];  Service: Ophthalmology;  Laterality: Right;  CDE: 16.87  . Cataract extraction w/phaco  04/16/2012    Procedure: CATARACT EXTRACTION PHACO AND INTRAOCULAR LENS PLACEMENT (IOC);  Surgeon: Tonny Branch, MD;  Location: [facility];  Service: Ophthalmology;  Laterality: Left;  CDE:  14.98  . Transthoracic echocardiogram  12/16/2011    EF 55-60%; mod LVH & mod conc LVH, grade 2 diastolic dysfunction; mild MR; mod-severely calcfied MR annulus; LA mildly dilated; RV systolic pressure increased; mild TR  . Renal doppler  01/18/2010    Abdominal Aorta-50-69% diameter reduction, L Proximal Renal Artery stent-60% diameter reduction-no visible narrowing noted.  . Cardiac catheterization  11/25/2005    Normal coronary arteries and LV systolic function. There is a 90% in-stent restenosis diffusely.  . Cardiac catheterization  09/04/2007    LAD had a 50% segmental proximal stenosis.  . Renal angiogram Bilateral 09/04/2007    L Renal artery 95% ostial stenosis, stented w/ a 6x12 Genesis stent at 12atm, R Renal artery 95% ostial stenosis, stented w/ a 6x12 Genesis stent. Resulting in reduction of bilateral 95% ostial renal artery stenosis to 0% residual with excellent results.  . Renal angiogram Bilateral 05/06/2009    L Renal artery 80& in-stent restenosis stented w/ a 5x16iCAST PTFE-covered stent at 11-12atm, resulting in reduction of 80% stenosis to 0% residual. R Renal artery 90% stenosis stented w/ a 6x16 [employer] iCAST PTFE-covered stent at 12atm resulting in reduction of 90% stenosis to 0% residual.  . Lexiscan myoview   04/15/2008    No significant ischemia demonstrated  . Transthoracic echocardiogram  01/02/2008    EF >55%, normal  . Colonoscopy N/A 07/11/2013    Procedure: COLONOSCOPY;  Surgeon: Inda Castle, MD;  Location: [facility];  Service: Endoscopy;  Laterality: N/A;   Social History:  reports that she quit smoking about 9 years ago. Her smoking use included Cigarettes. She has a 25 pack-year smoking history. She has never used smokeless tobacco. She reports that she does not drink alcohol or use illicit drugs.  Allergies  Allergen Reactions  . Codeine Anaphylaxis    swelling  . Lidocaine Anaphylaxis  . Amlodipine Nausea Only  . Atenolol Other (See Comments)    Unknown  . Bactrim [Sulfamethoxazole-Tmp Ds] Nausea Only  . Bystolic [Nebivolol Hcl]     Cannot tolerate greater then 5 mg, causes chest pain  . Ciprofloxacin Itching and Nausea And Vomiting  . Levothyroxine     Patient reports allergy to generic products of levothyroxine, must  have brand  . Metformin Nausea And Vomiting  . Metoprolol Other (See Comments)    Burns ears  . Morphine And Related Other (See Comments)    Makes patient feel like she is floating  . Penicillins Rash    Family History:  Family History  Problem Relation Age of Onset  . Heart disease Mother   . Heart attack Mother   . Heart disease Father   . Heart attack Father   . Heart disease Sister   . Hypertension Sister   . Heart disease Daughter   . Hypertension Daughter   . Hypertension Daughter   . Cystic fibrosis Maternal Aunt      Prior to Admission medications   Medication Sig Start Date End Date Taking? Authorizing Provider  ALPRAZolam (XANAX XR) 1 MG 24 hr tablet Take 1 mg by mouth at bedtime. Anxiety   Yes Historical Provider, MD  B Complex-C (SUPER B COMPLEX PO) Take 1 tablet by mouth daily.    Yes Historical Provider, MD  bacitracin-polymyxin b (POLYSPORIN) ophthalmic ointment Place 1 application into the right eye See admin instructions.  Alternating with other eye drop every hour.   Yes Historical Provider, MD  Biotin 5000 MCG CAPS Take 5,000 mcg by mouth daily.    Yes Historical Provider, MD  Bromfenac Sodium (PROLENSA) 0.07 % SOLN Place 1 drop into the right eye See admin instructions. Alternating with other eye drop every hour.   Yes Historical Provider, MD  Cholecalciferol (VITAMIN D) 2000 UNITS tablet Take 2,000 Units by mouth every evening.    Yes Historical Provider, MD  cloNIDine (CATAPRES - DOSED IN MG/24 HR) 0.3 mg/24hr Place 1 patch onto the skin once a week. Change on Saturday 12/17/12  Yes Historical Provider, MD  clopidogrel (PLAVIX) 75 MG tablet Take 1 tablet (75 mg total) by mouth daily with breakfast. 08/29/13  Yes Lorretta Harp, MD  Coenzyme Q10 (CO Q-10) 50 MG CAPS Take 50 mg by mouth daily.   Yes Historical Provider, MD  diphenoxylate-atropine (LOMOTIL) 2.5-0.025 MG per tablet Take 1 tablet by mouth 2 (two) times daily as needed for diarrhea or loose stools. 07/12/13  Yes Willia Craze, NP  escitalopram (LEXAPRO) 20 MG tablet Take 20 mg by mouth 2 (two) times daily.    Yes Historical Provider, MD  furosemide (LASIX) 20 MG tablet Take 20 mg by mouth 2 (two) times daily.   Yes Historical Provider, MD  hydrALAZINE (APRESOLINE) 100 MG tablet Take 100 mg by mouth 3 (three) times daily.   Yes Historical Provider, MD  insulin aspart (NOVOLOG) 100 UNIT/ML injection Inject 28-34 Units into the skin 3 (three) times daily with meals. Takes 28 units with breakfast, 28 units with lunch, and 34 units with supper   Yes Historical Provider, MD  insulin glargine (LANTUS) 100 UNIT/ML injection Inject 60 Units into the skin at bedtime.    Yes Historical Provider, MD  irbesartan (AVAPRO) 300 MG tablet Take 1 tablet (300 mg total) by mouth at bedtime. 10/21/13  Yes Lorretta Harp, MD  isosorbide mononitrate (IMDUR) 60 MG 24 hr tablet Take 1 tablet (60 mg total) by mouth at bedtime. 08/30/13  Yes Lorretta Harp, MD  levothyroxine  (SYNTHROID, LEVOTHROID) 112 MCG tablet Take 112 mcg by mouth every morning.    Yes Historical Provider, MD  nebivolol (BYSTOLIC) 5 MG tablet Take 5 mg by mouth every morning. 03/11/13  Yes Lorretta Harp, MD  niacin (NIASPAN) 500 MG CR tablet Take  500 mg by mouth 2 (two) times daily.    Yes Historical Provider, MD  Omega-3 Fatty Acids (FISH OIL TRIPLE STRENGTH PO) Take 2 capsules by mouth 2 (two) times daily.    Yes Historical Provider, MD  oxyCODONE-acetaminophen (PERCOCET/ROXICET) 5-325 MG per tablet Take 0.5-1 tablets by mouth every 6 (six) hours as needed for moderate pain.  08/08/13  Yes Historical Provider, MD  potassium chloride SA (K-DUR,KLOR-CON) 20 MEQ tablet Take 1 tablet (20 mEq total) by mouth daily after breakfast. 08/29/13  Yes Lorretta Harp, MD   Physical Exam: Filed Vitals:   02/03/14 2327 02/03/14 2330 02/04/14 0048 02/04/14 0056  BP:  135/41 111/35 111/38  Pulse:  87 85 82  Temp: 99 F (37.2 C)     TempSrc: Rectal     Resp:  18  18  SpO2:  97% 98% 96%    Physical Exam  Constitutional: Appears well-developed and well-nourished. No distress.  HENT: Normocephalic. No tonsillar erythema or exudates Eyes:  conjunctiva is red - right eye. Normal left thigh. Patient able to open up both eyes, extraocular muscles intact.  Neck: Normal ROM. Neck supple. No JVD. No tracheal deviation. No thyromegaly.  CVS: RRR, S1/S2 +, no murmurs, no gallops, no carotid bruit.  Pulmonary: Effort and breath sounds normal, no stridor, rhonchi, wheezes, rales.  Abdominal: Soft. BS +,  no distension, tenderness, rebound or guarding.  Musculoskeletal: Normal range of motion. No  tenderness.  Lymphadenopathy: No lymphadenopathy noted, cervical, inguinal. Neuro: Alert. Normal reflexes, muscle tone coordination. No focal neurologic deficits. Skin: Skin is warm and dry. No rash noted. Not diaphoretic. No erythema. No pallor.  Psychiatric: Normal mood and affect. Behavior, judgment, thought content  normal.   Labs on Admission:  Basic Metabolic Panel:  Recent Labs Lab 02/03/14 2324  NA 139  K 3.9  CL 104  CO2 17*  GLUCOSE 68*  BUN 77*  CREATININE 2.45*  CALCIUM 9.4   Liver Function Tests:  Recent Labs Lab 02/03/14 2324  AST 29  ALT 23  ALKPHOS 45  BILITOT 0.3  PROT 7.7  ALBUMIN 3.4*   No results found for this basename: LIPASE, AMYLASE,  in the last 168 hours No results found for this basename: AMMONIA,  in the last 168 hours CBC:  Recent Labs Lab 02/03/14 2324  WBC 11.2*  NEUTROABS 6.8  HGB 11.7*  HCT 34.6*  MCV 92.5  PLT 300   Cardiac Enzymes: No results found for this basename: CKTOTAL, CKMB, CKMBINDEX, TROPONINI,  in the last 168 hours BNP: No components found with this basename: POCBNP,  CBG:  Recent Labs Lab 02/03/14 2318 02/04/14 0146  GLUCAP 73 69*    If 7PM-7AM, please contact night-coverage [email or web address] Password [email or web address] 02/04/2014, 2:40 AM

## 2014-02-05 DIAGNOSIS — E1129 Type 2 diabetes mellitus with other diabetic kidney complication: Secondary | ICD-10-CM

## 2014-02-05 LAB — BASIC METABOLIC PANEL
ANION GAP: 13 (ref 5–15)
BUN: 67 mg/dL — ABNORMAL HIGH (ref 6–23)
CALCIUM: 8.5 mg/dL (ref 8.4–10.5)
CHLORIDE: 106 meq/L (ref 96–112)
CO2: 18 meq/L — AB (ref 19–32)
Creatinine, Ser: 2.13 mg/dL — ABNORMAL HIGH (ref 0.50–1.10)
GFR calc Af Amer: 27 mL/min — ABNORMAL LOW (ref >90)
GFR calc non Af Amer: 23 mL/min — ABNORMAL LOW (ref >90)
GLUCOSE: 101 mg/dL — AB (ref 70–99)
POTASSIUM: 4.6 meq/L (ref 3.7–5.3)
SODIUM: 137 meq/L (ref 137–147)

## 2014-02-05 LAB — GLUCOSE, CAPILLARY
GLUCOSE-CAPILLARY: 104 mg/dL — AB (ref 70–99)
GLUCOSE-CAPILLARY: 99 mg/dL (ref 70–99)

## 2014-02-05 LAB — URINE CULTURE
Colony Count: NO GROWTH
Culture: NO GROWTH

## 2014-02-05 MED ORDER — OXYCODONE-ACETAMINOPHEN 5-325 MG PO TABS: 0.5000 | ORAL_TABLET | Freq: Four times a day (QID) | ORAL | Status: AC | PRN

## 2014-02-05 MED ORDER — TOBRAMYCIN 0.3 % OP SOLN
2.0000 [drp] | OPHTHALMIC | Status: DC
  Administered 2014-02-05 (×6): 2 [drp] via OPHTHALMIC
  Filled 2014-02-05: qty 5

## 2014-02-05 MED ORDER — HYDRALAZINE HCL 25 MG PO TABS: 125.0000 mg | ORAL_TABLET | Freq: Three times a day (TID) | ORAL | Status: AC

## 2014-02-05 MED ORDER — NONFORMULARY OR COMPOUNDED ITEM: 1.0000 "application " | Status: AC

## 2014-02-05 NOTE — Progress Notes (Signed)
02/05/2014 2:09 AM  Patient ran out of both antibiotic eye drops. Consulted with pharmacy and they do not have the proper ingredients to make the eye drops. Informed on call MD K. Schorr and she stated to call the family and see if they could bring the medication. Patient stated that the family would be here in the morning that they live over two hours away and she does not want me to call them to bring the medication this early in the morning. On Call MD consulted with the pharmacy and MD wrote a new order to a similar concentration of once antibiotic and we would have to do without on the other one. Informed the patient of this medication change and she verbalized understanding and appreciated the fact that she would be getting something for her eye since it was now beginning to hurt. Will continue to assess and monitor the patient.  [address] BSN, RN3 [place]  Number: [phone]

## 2014-02-05 NOTE — Progress Notes (Signed)
Patient discharge teaching given, including activity, diet, follow-up appoints, and medications. Patient verbalized understanding of all discharge instructions. IV access was d/c'd. Vitals are stable. Skin is intact except as charted in most recent assessments. Pt to be escorted out by NT, to be driven home by family.  Tyrese Ficek, MBA, BS, RN 

## 2014-02-05 NOTE — Progress Notes (Signed)
All home meds returned to patient.  Jillyn Ledger, MBA, BS, RN

## 2014-02-05 NOTE — Discharge Summary (Addendum)
Physician Discharge Summary  LASHANNON BRESNAN VZC:[id number] DOB: [birth year] DOA: 02/03/2014  PCP: Lanette Hampshire, MD  Admit date: 02/03/2014 Discharge date: 02/05/2014  Time spent: >35 minutes  Recommendations for Outpatient Follow-up:  follow up with ophthalmologist as scheduled follow up with primary care doctor in 3-5 days   Discharge Diagnoses:  Principal Problem:   Preseptal cellulitis Active Problems:   HYPOTHYROIDISM   HYPERLIPIDEMIA, hx of statin induced myosistis   Leukocytosis   Diabetes mellitus with renal complications   RAS (renal artery stenosis), s/p renal artery PTA X 2   Esophageal reflux   Chronic renal insufficiency, stage III (moderate)   Depression   Chronic diastolic heart failure- grade 3 by echo July 2014   HTN (hypertension)   Anemia of chronic disease   Discharge Condition: stable   Diet recommendation: low sodium, DM  Filed Weights   02/04/14 0923 02/04/14 2046  Weight: 86.9 kg (191 lb 9.3 oz) 86.901 kg (191 lb 9.3 oz)    History of present illness:  68 year old female with multiple medical comorbidities including but not limited to diabetes mellitus with renal complications, chronic kidney disease stage III (baseline creatinine 1 year ago 1.57), chronic diastolic CHF grade 3 (last 2D echo in July 2014 with normal ejection fraction), hypothyroidism, dyslipidemia, renal artery stenosis status post renal artery stenting, depression, hypertension who presented to [facility] ED [phone] with worsening redness and pain in right eye.  -Patient was seen by a physician 3 days prior to this admission and was given eyedrops which were custom made in local pharmacy as she is allergic to so many medications. She reports no specific trauma to the eye. 2 days prior to this admission her eye started to become more red and she had hard time opening the eye. No fevers or chills. No discharge from the eye. No reports of visual loss. No problems in the left eye.    Hospital  Course:  68/F with severe Conjunctivitis/corneal ulcer with decreased vision for 2 weeks, seen by Jene Every at [facility]  Admitted with severe eye pain and nausea, also found to have AKI on CKD.  She has chronic medical problems, DM, CKD 3, chronic diastolic CHF, PUlm HTN, RAS s/p stenting, HTN.  1. AKI on CKD  -improved with hydration, hold ARB and lasix, clinically euvolemic; recommended to f/u with PCP in 3-5 days to recheck BMP, reeval for diuretics as needed  2. Severe Conjunctivitis of R eye with corneal ulcer  -had maxillofacial CT which revealed mild R eyelid swelling, no peri-orbital abscess  -was seen by Dr.Cohen in office and started on Vanc/Tobra eye drops Q1h, bacitracin ointment, atropine and Po Doxycycline.  - Dr/ Broadus John called her office, she is out of town today, but d/w her partner dr.Groat and discussed admission and CT findings, they felt there is no need for her to be admitted for her ocular problem, he felt she should continue the eye drops as prescribed and should FU with them tomorrow, she has an appt at 2:30pm at [facility] care on [address] [employer].    3. HTN, hold ARB; cont clonidine; increased hydralazine to 125 TID; titrate as needed as outpatient  4. DM, cont home regimen  5. chronic diastolic CHF grade 3 (last 2D echo in July 2014 with normal ejection fraction -clinically euvolemic; holding lasix for 3 days; d/w patient recommended to f/u with PCP in 3-5 days to reevaluate for diuretics as needed   Procedures:  none (i.e. Studies not automatically included, echos, thoracentesis, etc; not x-rays)  Consultations:  ophthalmology   Discharge Exam: Filed Vitals:   02/05/14 1020  BP: 150/49  Pulse: 66  Temp:   Resp:     General: alert Cardiovascular: s1,s2 rrr Respiratory: CTA BL  Discharge Instructions  Discharge Instructions   Diet - low sodium heart healthy    Complete by:  As directed      Discharge instructions    Complete by:   As directed   Please follow up with ophthalmologist as scheduled Please follow up with primary care doctor in 3-5 days     Increase activity slowly    Complete by:  As directed             Medication List    STOP taking these medications       furosemide 20 MG tablet  Commonly known as:  LASIX     irbesartan 300 MG tablet  Commonly known as:  AVAPRO     potassium chloride SA 20 MEQ tablet  Commonly known as:  K-DUR,KLOR-CON      TAKE these medications       ALPRAZolam 1 MG 24 hr tablet  Commonly known as:  XANAX XR  Take 1 mg by mouth at bedtime. Anxiety     atropine 1 % ophthalmic solution  Place 1 drop into the right eye at bedtime.     bacitracin ophthalmic ointment  Place 1 application into the right eye at bedtime. apply to eye     Biotin 5000 MCG Caps  Take 5,000 mcg by mouth daily.     cloNIDine 0.3 mg/24hr patch  Commonly known as:  CATAPRES - Dosed in mg/24 hr  Place 1 patch onto the skin once a week. Change on Saturday     clopidogrel 75 MG tablet  Commonly known as:  PLAVIX  Take 1 tablet (75 mg total) by mouth daily with breakfast.     Co Q-10 50 MG Caps  Take 50 mg by mouth daily.     diphenoxylate-atropine 2.5-0.025 MG per tablet  Commonly known as:  LOMOTIL  Take 1 tablet by mouth 2 (two) times daily as needed for diarrhea or loose stools.     escitalopram 20 MG tablet  Commonly known as:  LEXAPRO  Take 20 mg by mouth 2 (two) times daily.     FISH OIL TRIPLE STRENGTH PO  Take 2 capsules by mouth 2 (two) times daily.     hydrALAZINE 25 MG tablet  Commonly known as:  APRESOLINE  Take 5 tablets (125 mg total) by mouth 3 (three) times daily.     insulin aspart 100 UNIT/ML injection  Commonly known as:  novoLOG  Inject 28-34 Units into the skin 3 (three) times daily with meals. Takes 28 units with breakfast, 28 units with lunch, and 34 units with supper     insulin glargine 100 UNIT/ML injection  Commonly known as:  LANTUS  Inject 60  Units into the skin at bedtime.     isosorbide mononitrate 60 MG 24 hr tablet  Commonly known as:  IMDUR  Take 1 tablet (60 mg total) by mouth at bedtime.     levothyroxine 112 MCG tablet  Commonly known as:  SYNTHROID, LEVOTHROID  Take 112 mcg by mouth every morning.     nebivolol 5 MG tablet  Commonly known as:  BYSTOLIC  Take 5 mg by mouth every morning.     niacin 500 MG  CR tablet  Commonly known as:  NIASPAN  Take 500 mg by mouth 2 (two) times daily.     NONFORMULARY OR COMPOUNDED ITEM  Place 1 application into the right eye every hour.     NONFORMULARY OR COMPOUNDED ITEM  Place 1 application into the right eye every hour.     oxyCODONE-acetaminophen 5-325 MG per tablet  Commonly known as:  PERCOCET/ROXICET  Take 0.5-1 tablets by mouth every 6 (six) hours as needed for moderate pain.     PRESCRIPTION MEDICATION  Place 25 mg/mL into the right eye every hour. Patient uses Vancomycin 25 mg/mL every hour in right eye. She alternates this with her tobramycin eye drop.     PRESCRIPTION MEDICATION  Place 13.6 mg/mL into the right eye every hour. Tobramycin 13.6 mg/mL. Patient alternates this eye drop with vancomycin eye drop in right eye every hour     SUPER B COMPLEX PO  Take 1 tablet by mouth daily.     Vitamin D 2000 UNITS tablet  Take 2,000 Units by mouth every evening.       Allergies  Allergen Reactions  . Codeine Anaphylaxis    swelling  . Lidocaine Anaphylaxis  . Amlodipine Nausea Only  . Atenolol Other (See Comments)    Unknown  . Bactrim [Sulfamethoxazole-Tmp Ds] Nausea Only  . Bystolic [Nebivolol Hcl]     Cannot tolerate greater then 5 mg, causes chest pain  . Ciprofloxacin Itching and Nausea And Vomiting  . Levothyroxine     Patient reports allergy to generic products of levothyroxine, must have brand  . Metformin Nausea And Vomiting  . Metoprolol Other (See Comments)    Burns ears  . Morphine And Related Other (See Comments)    Makes patient feel  like she is floating  . Penicillins Rash       Follow-up Information   Follow up with Lanette Hampshire, MD. Schedule an appointment as soon as possible for a visit in 5 days.   Specialty:  Family Medicine   Contact information:   [address] [place] [address] [phone]        The results of significant diagnostics from this hospitalization (including imaging, microbiology, ancillary and laboratory) are listed below for reference.    Significant Diagnostic Studies: Dg Chest 2 View  02/04/2014   CLINICAL DATA:  Chest pain.  EXAM: CHEST  2 VIEW  COMPARISON:  Chest radiograph performed 12/09/2012  FINDINGS: The lungs are well-aerated and clear. There is no evidence of focal opacification, pleural effusion or pneumothorax.  The heart is normal in size; the mediastinal contour is within normal limits. No acute osseous abnormalities are seen.  IMPRESSION: No acute cardiopulmonary process seen.   Electronically Signed   By: Garald Balding M.D.   On: 02/04/2014 00:00   Ct Head Wo Contrast  02/04/2014   CLINICAL DATA:  Sudden onset of right-sided visual loss.  EXAM: CT HEAD WITHOUT CONTRAST  CT MAXILLOFACIAL WITHOUT CONTRAST  TECHNIQUE: Multidetector CT imaging of the head and maxillofacial structures were performed using the standard protocol without intravenous contrast. Multiplanar CT image reconstructions of the maxillofacial structures were also generated.  COMPARISON:  CT of the head performed 12/09/2012, and MRI of the brain from 12/06/2012  FINDINGS: CT HEAD FINDINGS  There is no evidence of acute infarction, mass lesion, or intra- or extra-axial hemorrhage on CT.  Diffuse periventricular and subcortical white matter change likely reflects small vessel ischemic microangiopathy. A small chronic infarct is noted in  the right cerebellar hemisphere. Prominence of the ventricles and sulci suggests mild cortical volume loss.  The brainstem and fourth ventricle are within normal limits. The  basal ganglia are unremarkable in appearance. The cerebral hemispheres demonstrate grossly normal gray-white differentiation. No mass effect or midline shift is seen.  There is no evidence of fracture; visualized osseous structures are unremarkable in appearance. There is suggestion of mild eyelid thickening on the right side, which could reflect mild preseptal cellulitis. The orbits are within normal limits. There is complete opacification of the right mastoid air cells, and partial opacification of the left mastoid air cells. The paranasal sinuses are well-aerated. No significant soft tissue abnormalities are seen.  CT MAXILLOFACIAL FINDINGS  There is no evidence of fracture or dislocation. The maxilla and mandible appear intact. The nasal bone is unremarkable in appearance. The visualized dentition demonstrates no acute abnormality.  The orbits are intact bilaterally. There is complete opacification of the right mastoid air cells, and partial opacification of the left mastoid air cells. The visualized paranasal sinuses are well-aerated.  There is suggestion of slightly asymmetric right eyelid thickening, which could reflect mild preseptal cellulitis. The right optic globe remains intact. There is no evidence of postseptal extension of inflammation. The parapharyngeal fat planes are preserved. The nasopharynx, oropharynx and hypopharynx are unremarkable in appearance. The visualized portions of the valleculae and piriform sinuses are grossly unremarkable.  Incidental note is made of retropharyngeal common carotid arteries bilaterally, with mild calcification at the right carotid bifurcation.  The parotid and submandibular glands are within normal limits. No cervical lymphadenopathy is seen.  IMPRESSION: 1. Suggestion of slightly asymmetric right eyelid thickening, which could reflect mild preseptal cellulitis. The right orbit is unremarkable in appearance. 2. Complete opacification of the right mastoid air cells,  and partial opacification of the left mastoid air cells. 3. No acute intracranial pathology seen on CT. 4. Diffuse small vessel ischemic microangiopathy. 5. Mild cortical volume loss. 6. 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   Electronically Signed   By: Garald Balding M.D.   On: 02/04/2014 01:50   Ct Maxillofacial Wo Cm  02/04/2014   CLINICAL DATA:  Sudden onset of right-sided visual loss.  EXAM: CT HEAD WITHOUT CONTRAST  CT MAXILLOFACIAL WITHOUT CONTRAST  TECHNIQUE: Multidetector CT imaging of the head and maxillofacial structures were performed using the standard protocol without intravenous contrast. Multiplanar CT image reconstructions of the maxillofacial structures were also generated.  COMPARISON:  CT of the head performed 12/09/2012, and MRI of the brain from 12/06/2012  FINDINGS: CT HEAD FINDINGS  There is no evidence of acute infarction, mass lesion, or intra- or extra-axial hemorrhage on CT.  Diffuse periventricular and subcortical white matter change likely reflects small vessel ischemic microangiopathy. A small chronic infarct is noted in the right cerebellar hemisphere. Prominence of the ventricles and sulci suggests mild cortical volume loss.  The brainstem and fourth ventricle are within normal limits. The basal ganglia are unremarkable in appearance. The cerebral hemispheres demonstrate grossly normal gray-white differentiation. No mass effect or midline shift is seen.  There is no evidence of fracture; visualized osseous structures are unremarkable in appearance. There is suggestion of mild eyelid thickening on the right side, which could reflect mild preseptal cellulitis. The orbits are within normal limits. There is complete opacification of the right mastoid air cells, and partial opacification of the left mastoid air cells. The paranasal sinuses are well-aerated. No significant soft tissue abnormalities are seen.  CT MAXILLOFACIAL FINDINGS  There is no evidence of  fracture or dislocation. The maxilla and mandible appear intact. The nasal bone is unremarkable in appearance. The visualized dentition demonstrates no acute abnormality.  The orbits are intact bilaterally. There is complete opacification of the right mastoid air cells, and partial opacification of the left mastoid air cells. The visualized paranasal sinuses are well-aerated.  There is suggestion of slightly asymmetric right eyelid thickening, which could reflect mild preseptal cellulitis. The right optic globe remains intact. There is no evidence of postseptal extension of inflammation. The parapharyngeal fat planes are preserved. The nasopharynx, oropharynx and hypopharynx are unremarkable in appearance. The visualized portions of the valleculae and piriform sinuses are grossly unremarkable.  Incidental note is made of retropharyngeal common carotid arteries bilaterally, with mild calcification at the right carotid bifurcation.  The parotid and submandibular glands are within normal limits. No cervical lymphadenopathy is seen.  IMPRESSION: 1. Suggestion of slightly asymmetric right eyelid thickening, which could reflect mild preseptal cellulitis. The right orbit is unremarkable in appearance. 2. Complete opacification of the right mastoid air cells, and partial opacification of the left mastoid air cells. 3. No acute intracranial pathology seen on CT. 4. Diffuse small vessel ischemic microangiopathy. 5. Mild cortical volume loss. 6. 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   Electronically Signed   By: Garald Balding M.D.   On: 02/04/2014 01:50    Microbiology: Recent Results (from the past 240 hour(s))  CULTURE, BLOOD (ROUTINE X 2)     Status: None   Collection Time    02/03/14 11:15 PM      Result Value Ref Range Status   Specimen Description BLOOD RIGHT ARM   Final   Special Requests BOTTLES DRAWN AEROBIC AND ANAEROBIC 10CC   Final   Culture  Setup Time     Final   Value: 02/04/2014  04:23     Performed at [facility]   Culture     Final   Value:        BLOOD CULTURE RECEIVED NO GROWTH TO DATE CULTURE WILL BE HELD FOR 5 DAYS BEFORE ISSUING A FINAL NEGATIVE REPORT     Performed at [facility]   Report Status PENDING   Incomplete  CULTURE, BLOOD (ROUTINE X 2)     Status: None   Collection Time    02/03/14 11:24 PM      Result Value Ref Range Status   Specimen Description BLOOD LEFT ARM   Final   Special Requests BOTTLES DRAWN AEROBIC ONLY 10CC   Final   Culture  Setup Time     Final   Value: 02/04/2014 04:22     Performed at [facility]   Culture     Final   Value:        BLOOD CULTURE RECEIVED NO GROWTH TO DATE CULTURE WILL BE HELD FOR 5 DAYS BEFORE ISSUING A FINAL NEGATIVE REPORT     Performed at [facility]   Report Status PENDING   Incomplete  URINE CULTURE     Status: None   Collection Time    02/04/14  2:27 AM      Result Value Ref Range Status   Specimen Description URINE, CLEAN CATCH   Final   Special Requests NONE   Final   Culture  Setup Time     Final   Value: 02/04/2014 03:08     Performed at [facility]     Final  Value: NO GROWTH     Performed at [facility]   Culture     Final   Value: NO GROWTH     Performed at [facility]   Report Status 02/05/2014 FINAL   Final     Labs: Basic Metabolic Panel:  Recent Labs Lab 02/03/14 2324 02/04/14 0726 02/05/14 0520  NA 139 138 137  K 3.9 4.9 4.6  CL 104 104 106  CO2 17* 17* 18*  GLUCOSE 68* 146* 101*  BUN 77* 71* 67*  CREATININE 2.45* 2.25* 2.13*  CALCIUM 9.4 8.6 8.5   Liver Function Tests:  Recent Labs Lab 02/03/14 2324 02/04/14 0726  AST 29 26  ALT 23 21  ALKPHOS 45 43  BILITOT 0.3 0.4  PROT 7.7 7.1  ALBUMIN 3.4* 3.1*   No results found for this basename: LIPASE, AMYLASE,  in the last 168 hours No results found for this basename: AMMONIA,  in the last 168 hours CBC:  Recent Labs Lab  02/03/14 2324 02/04/14 0726  WBC 11.2* 7.9  NEUTROABS 6.8  --   HGB 11.7* 11.3*  HCT 34.6* 33.8*  MCV 92.5 92.6  PLT 300 259   Cardiac Enzymes: No results found for this basename: CKTOTAL, CKMB, CKMBINDEX, TROPONINI,  in the last 168 hours BNP: BNP (last 3 results) No results found for this basename: PROBNP,  in the last 8760 hours CBG:  Recent Labs Lab 02/04/14 0320 02/04/14 0740 02/04/14 1646 02/04/14 2053 02/05/14 0733  GLUCAP 135* 146* 190* 170* 104*       Signed:  Eleena Grater N  [facility] 02/05/2014, 11:05 AM

## 2014-02-05 NOTE — Discharge Instructions (Signed)
Please follow up with ophthalmologist as scheduled Please follow up with primary care doctor in 3-5 days

## 2014-02-10 LAB — CULTURE, BLOOD (ROUTINE X 2)
CULTURE: NO GROWTH
Culture: NO GROWTH

## 2014-02-14 ENCOUNTER — Ambulatory Visit: Payer: Medicare Other | Admitting: Nurse Practitioner

## 2014-03-13 ENCOUNTER — Telehealth: Payer: Self-pay | Admitting: *Deleted

## 2014-03-13 NOTE — Telephone Encounter (Signed)
Faxed order for CPAP face mask to [facility] @ [phone].

## 2014-03-17 ENCOUNTER — Other Ambulatory Visit: Payer: Self-pay | Admitting: Cardiovascular Disease

## 2014-03-18 NOTE — Telephone Encounter (Signed)
Rx was sent to pharmacy electronically. 

## 2014-04-16 ENCOUNTER — Ambulatory Visit: Payer: Medicare Other | Admitting: Cardiovascular Disease

## 2014-05-01 ENCOUNTER — Encounter (HOSPITAL_COMMUNITY): Payer: Self-pay | Admitting: Cardiovascular Disease

## 2014-05-27 ENCOUNTER — Telehealth (HOSPITAL_COMMUNITY): Payer: Self-pay | Admitting: *Deleted

## 2014-06-20 ENCOUNTER — Telehealth (HOSPITAL_COMMUNITY): Payer: Self-pay | Admitting: *Deleted

## 2014-06-21 ENCOUNTER — Other Ambulatory Visit (HOSPITAL_COMMUNITY): Payer: Self-pay | Admitting: Cardiovascular Disease

## 2014-06-30 NOTE — Telephone Encounter (Signed)
klor-con stopped at discharge 01/2014

## 2014-07-04 ENCOUNTER — Telehealth: Payer: Self-pay | Admitting: *Deleted

## 2014-07-04 NOTE — Telephone Encounter (Signed)
Returned CPAP supply order to [facility]. 

## 2014-08-07 ENCOUNTER — Other Ambulatory Visit: Payer: Self-pay | Admitting: Cardiovascular Disease

## 2014-08-25 ENCOUNTER — Telehealth (HOSPITAL_COMMUNITY): Payer: Self-pay | Admitting: *Deleted

## 2014-09-03 ENCOUNTER — Other Ambulatory Visit: Payer: Self-pay | Admitting: *Deleted

## 2014-09-03 ENCOUNTER — Other Ambulatory Visit (HOSPITAL_COMMUNITY): Payer: Self-pay | Admitting: Cardiovascular Disease

## 2014-09-03 MED ORDER — CLOPIDOGREL BISULFATE 75 MG PO TABS: 75.0000 mg | ORAL_TABLET | Freq: Every day | ORAL | Status: AC

## 2014-10-25 ENCOUNTER — Other Ambulatory Visit: Payer: Self-pay | Admitting: Cardiovascular Disease

## 2015-04-18 IMAGING — CT CT HEAD W/O CM
1 series · 16 of 30 positions shown, 20 images · non-contrast
Comparison: 12/16/2011

CLINICAL DATA: Altered mental status

CT HEAD WITHOUT CONTRAST
TECHNIQUE: Contiguous axial images were obtained from the base of
the skull through the vertex without contrast.

[Series 2: headseq 4.8 h37s · axial · 0.43mm/px · z∈[+91,+226]mm · 16 of 30 slices shown, 20 images]
[im 2/30  brain]
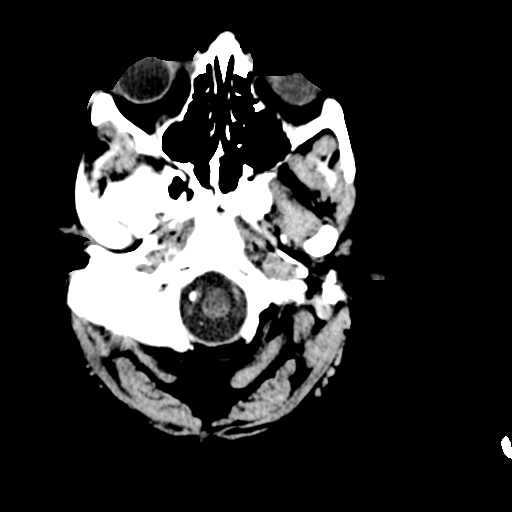
[im 2/30  bone]
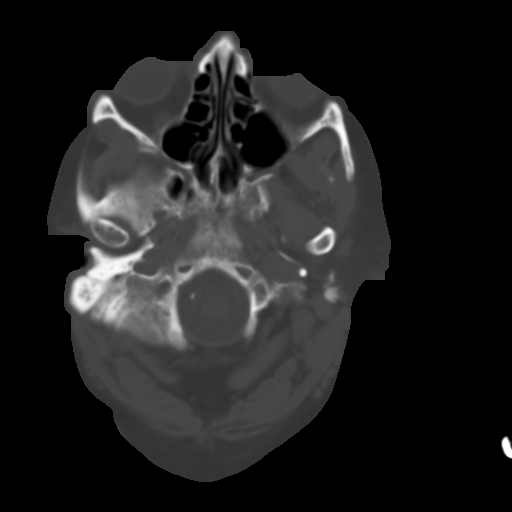
[im 4/30  brain]
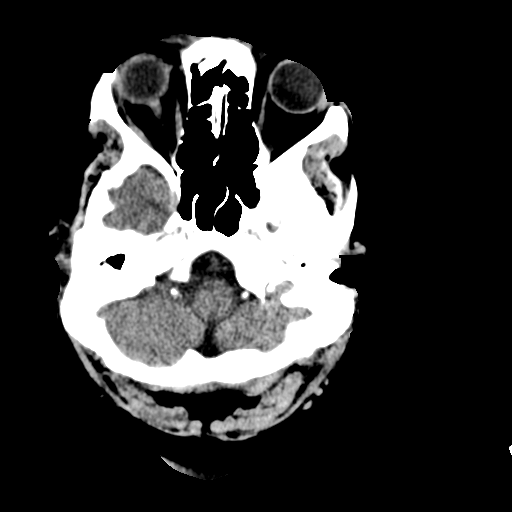
[im 6/30  brain]
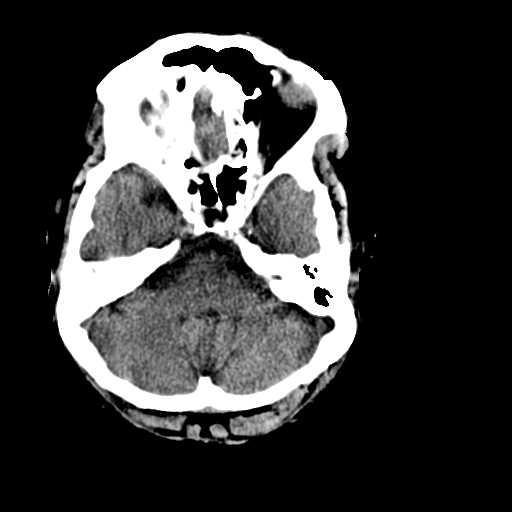
[im 8/30  brain]
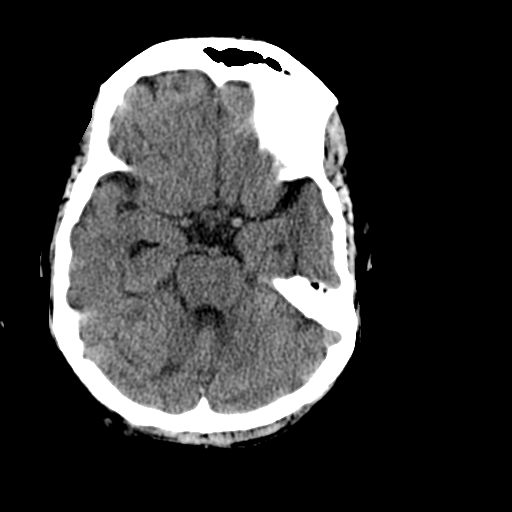
[im 9/30  brain]
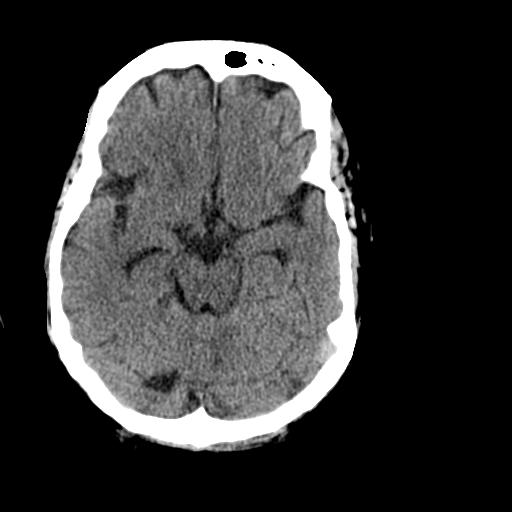
[im 9/30  bone]
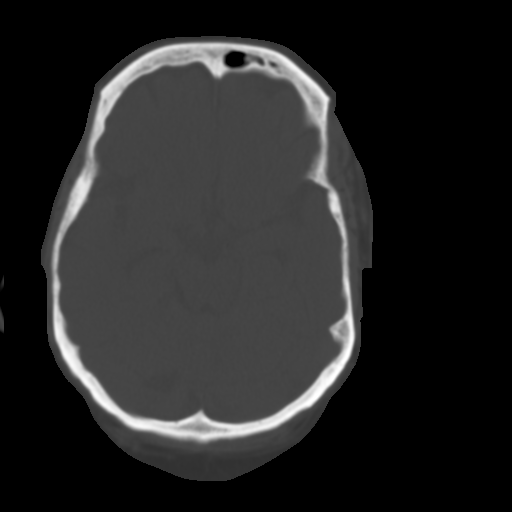
[im 11/30  brain]
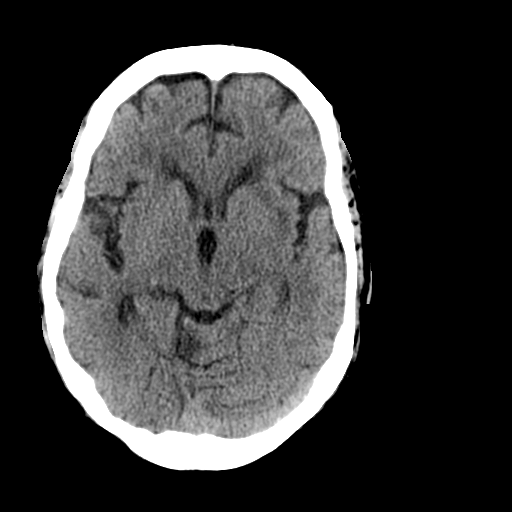
[im 13/30  brain]
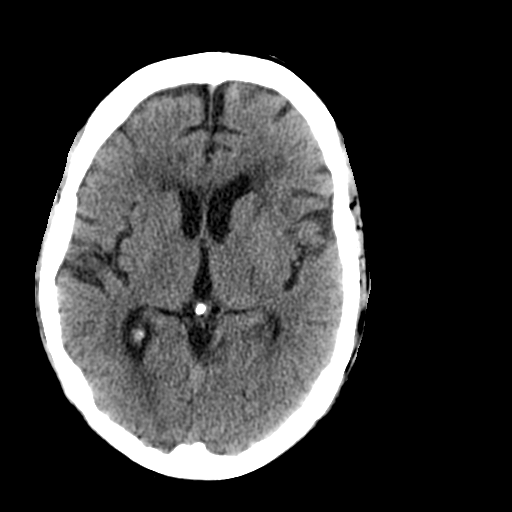
[im 15/30  brain]
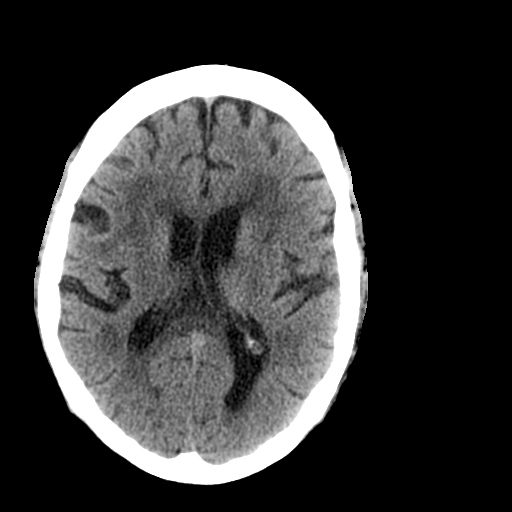
[im 16/30  brain]
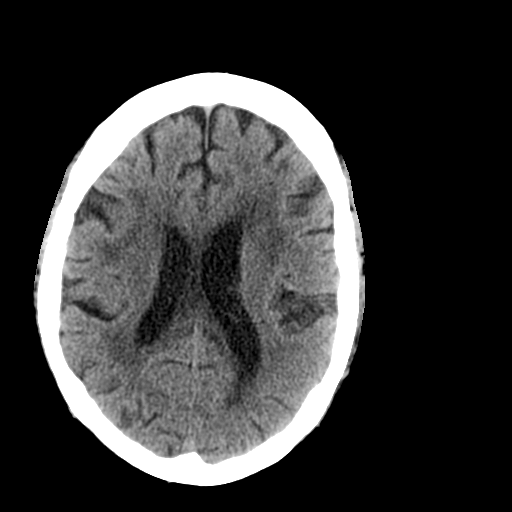
[im 16/30  bone]
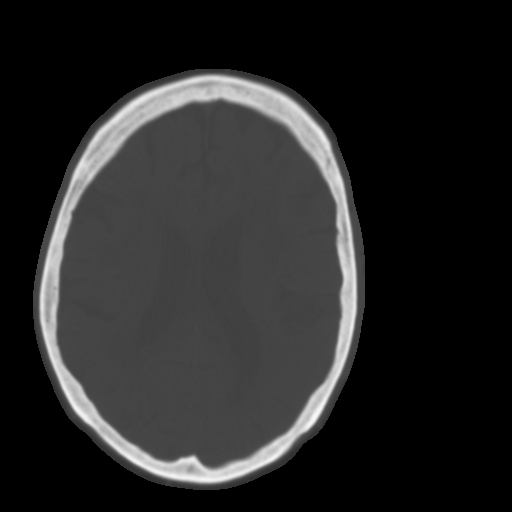
[im 18/30  brain]
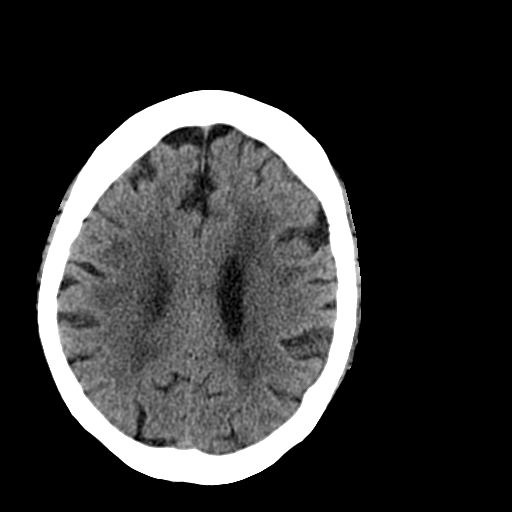
[im 20/30  brain]
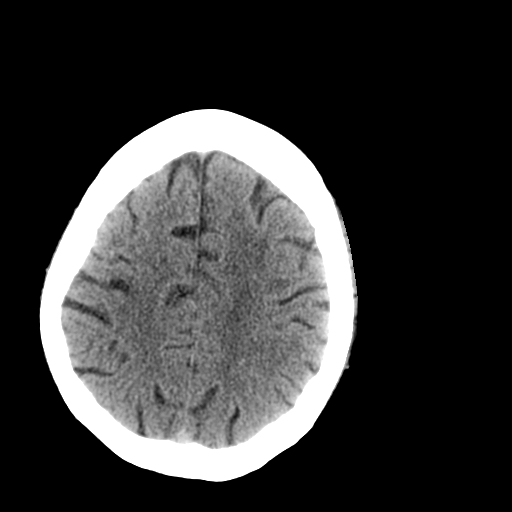
[im 22/30  brain]
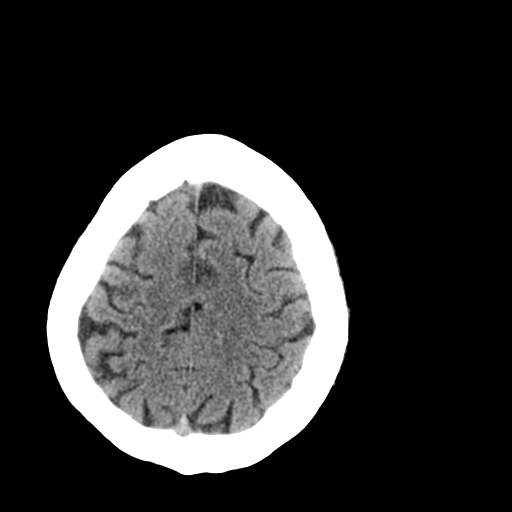
[im 23/30  brain]
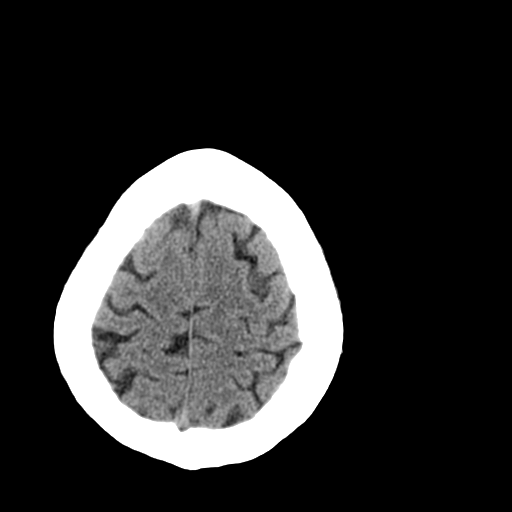
[im 23/30  bone]
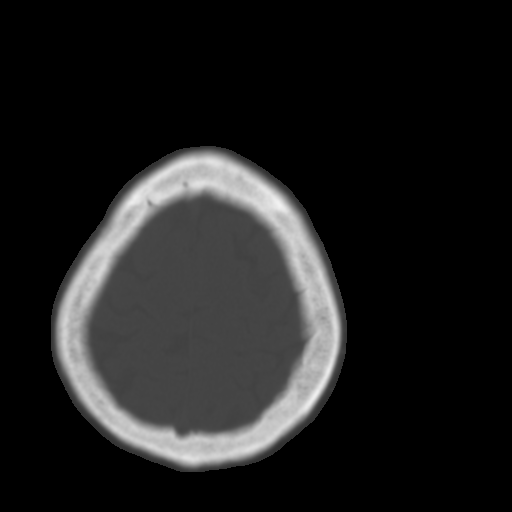
[im 25/30  brain]
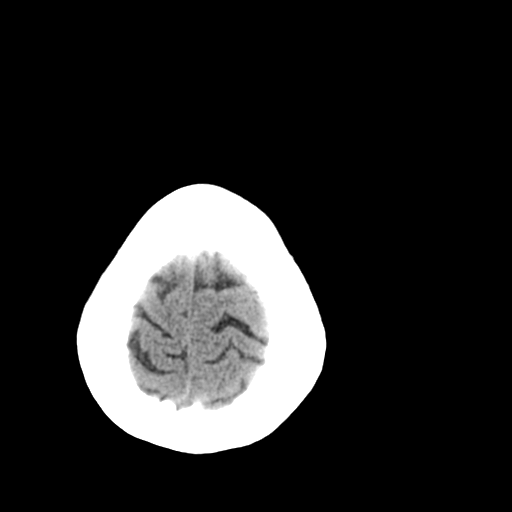
[im 27/30  brain]
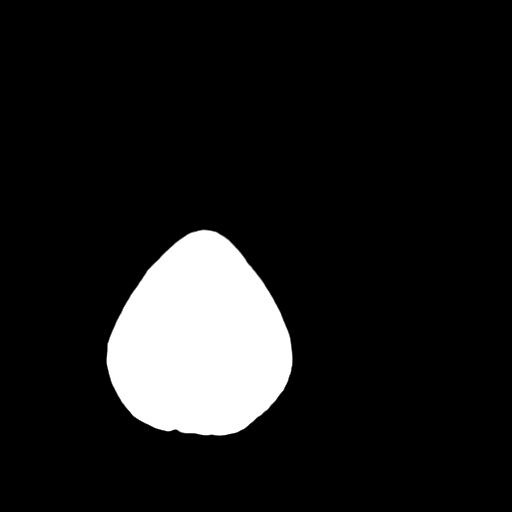
[im 29/30  brain]
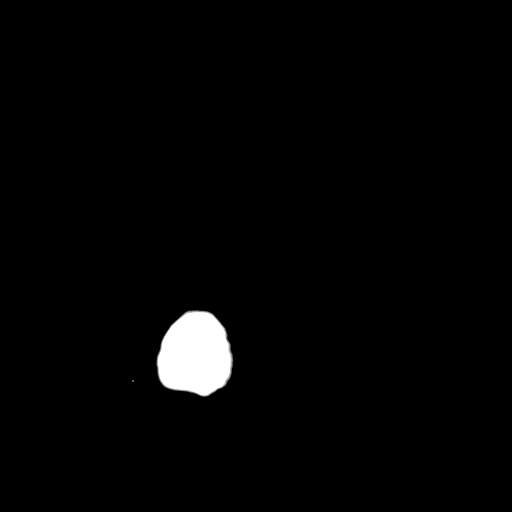

[16 of 30 positions shown; findings below may reference images not displayed]

FINDINGS: No skull fracture is noted. No intracranial hemorrhage,
mass effect or midline shift.  Lacunar infarct right cerebellum is
stable.  No acute cortical infarction.  No mass lesion is noted on
this unenhanced scan.  Stable atrophy and chronic white matter
disease.

Paranasal sinuses are unremarkable.  There is complete
opacification of the right mastoid air cells.
IMPRESSION: 1.  No acute intracranial abnormality.  Stable atrophy and chronic
white matter disease.  Stable lacunar infarct in the right
cerebellum. Complete opacification of the right mastoid air cells.

## 2015-04-23 IMAGING — CT CT HEAD W/O CM
1 series · 16 of 30 positions shown, 20 images · non-contrast
Comparison: MRI brain and 12/06/2012, CT 12/16/2011

CLINICAL DATA: Altered mental status

CT HEAD WITHOUT CONTRAST
TECHNIQUE: Contiguous axial images were obtained from the base of
the skull through the vertex without contrast.

[Series 2: headtrauma 4.8 h37s · axial · 0.44mm/px · z∈[+1143,+1274]mm · 16 of 30 slices shown, 20 images]
[im 2/30  brain]
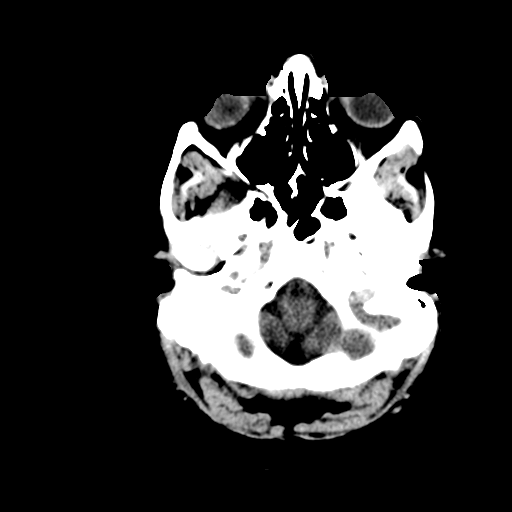
[im 2/30  bone]
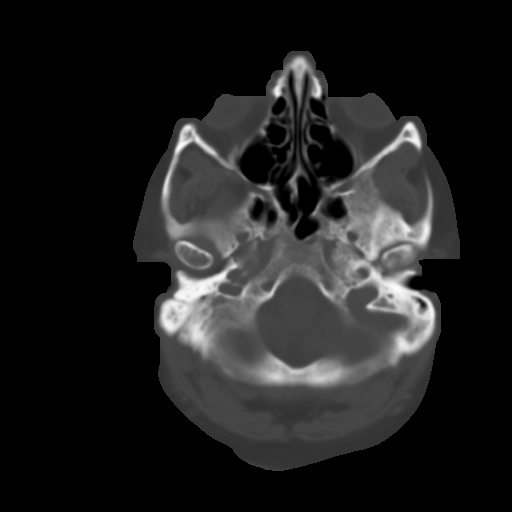
[im 4/30  brain]
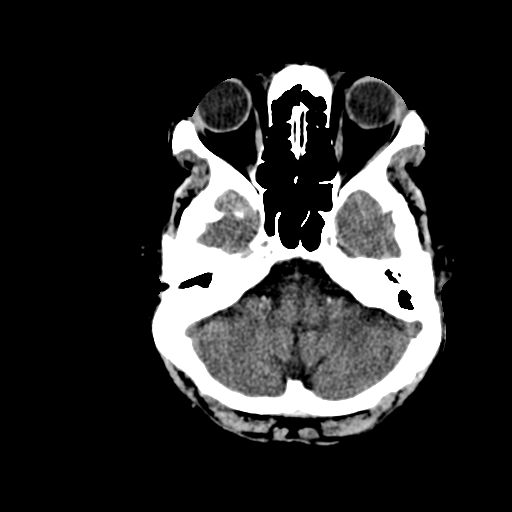
[im 6/30  brain]
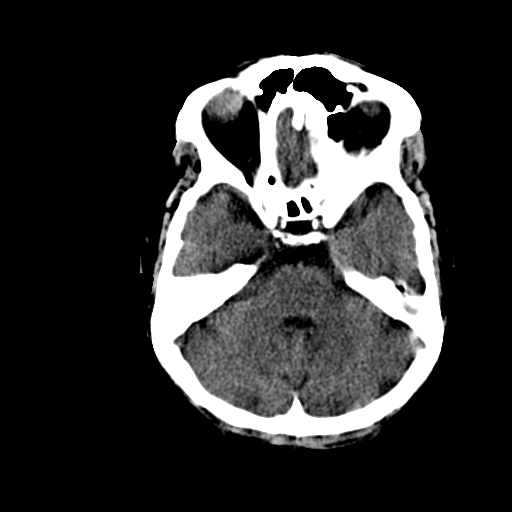
[im 8/30  brain]
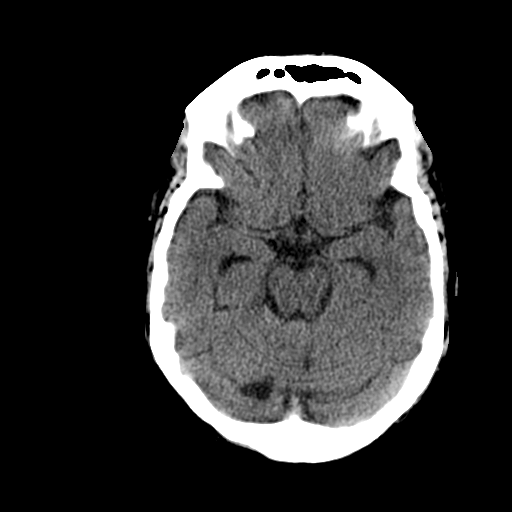
[im 9/30  brain]
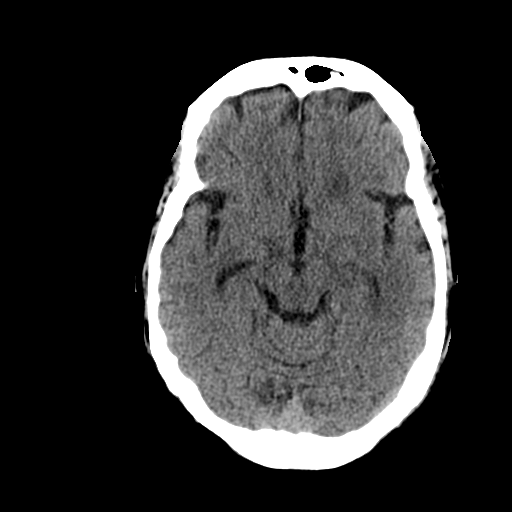
[im 9/30  bone]
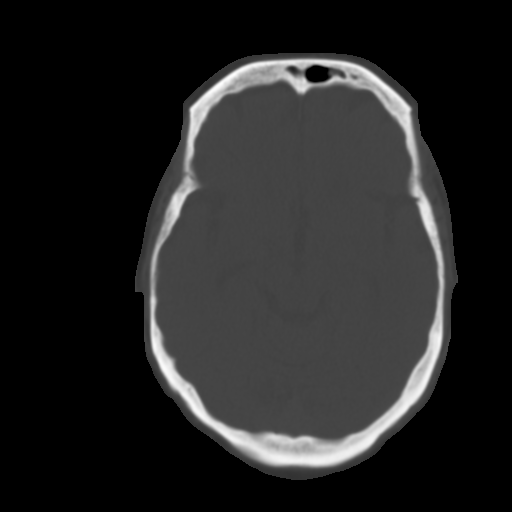
[im 11/30  brain]
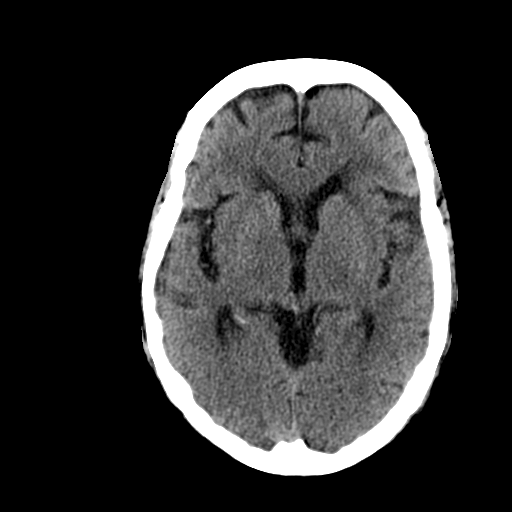
[im 13/30  brain]
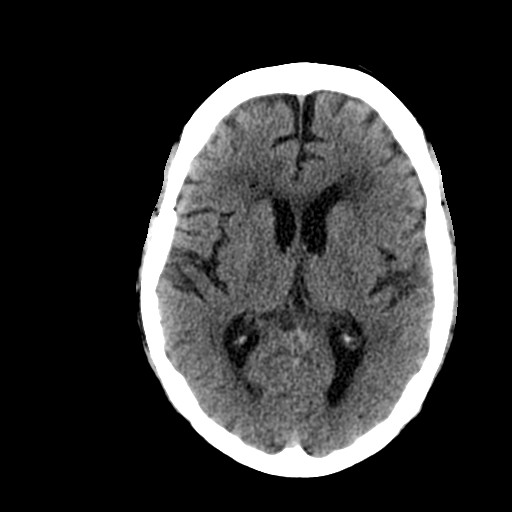
[im 15/30  brain]
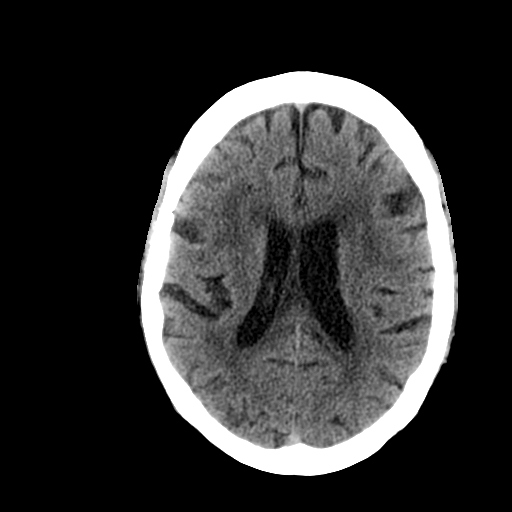
[im 16/30  brain]
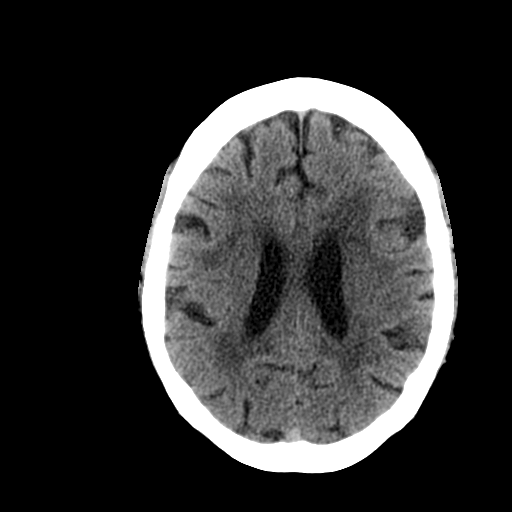
[im 16/30  bone]
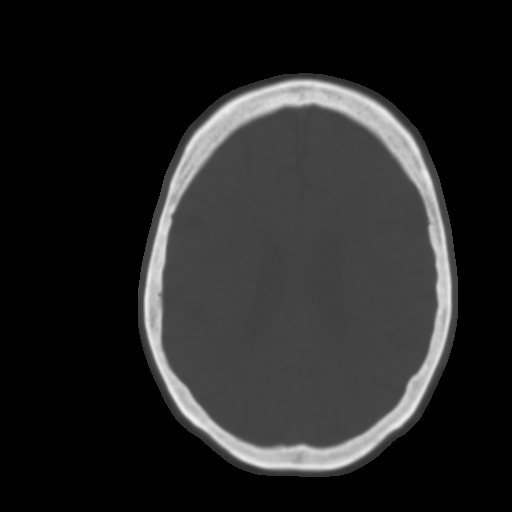
[im 18/30  brain]
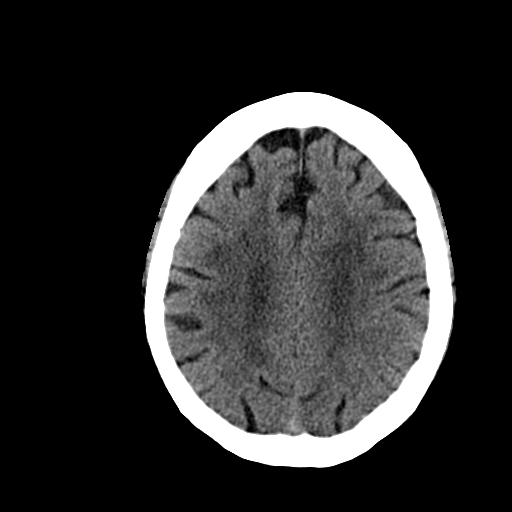
[im 20/30  brain]
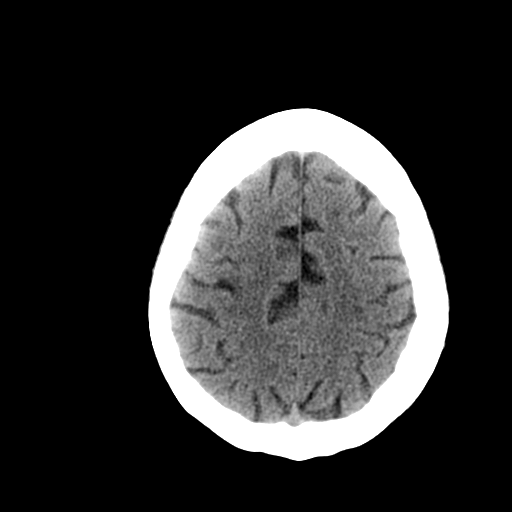
[im 22/30  brain]
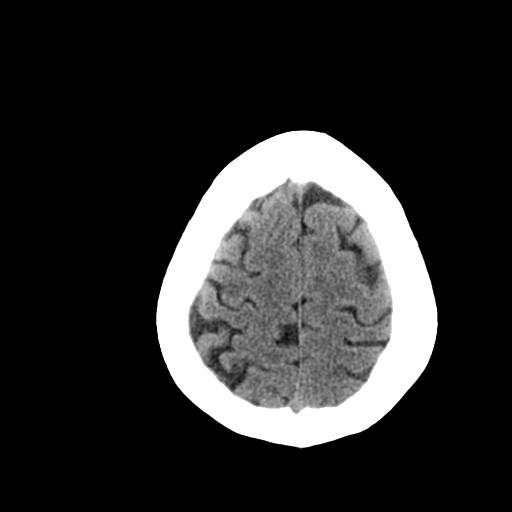
[im 23/30  brain]
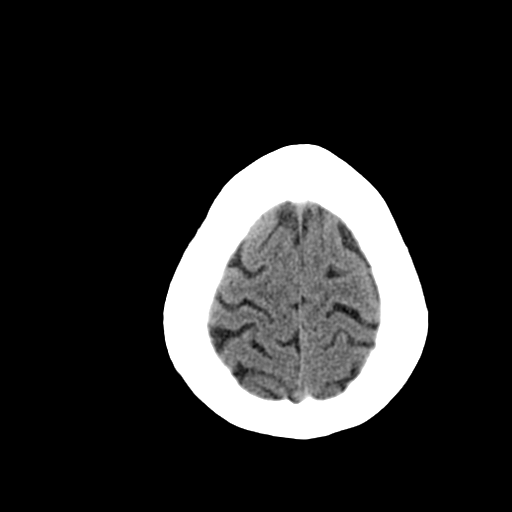
[im 23/30  bone]
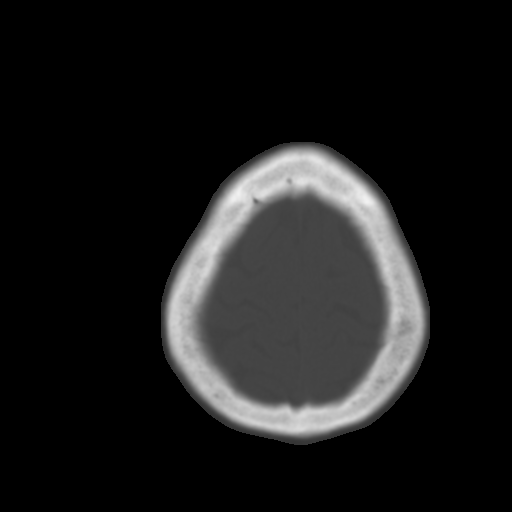
[im 25/30  brain]
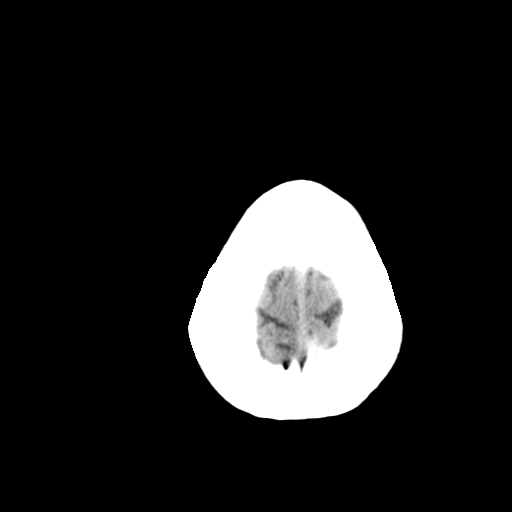
[im 27/30  brain]
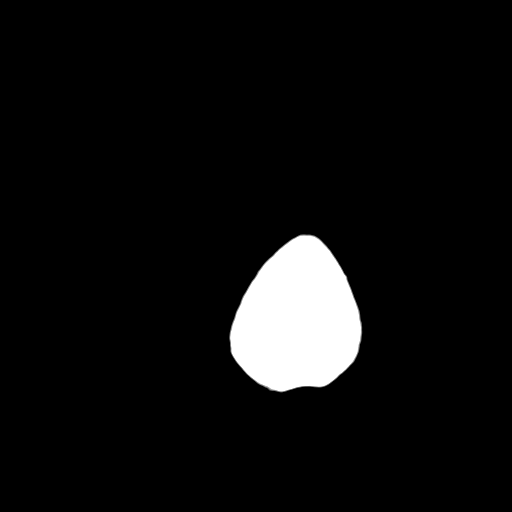
[im 29/30  brain]
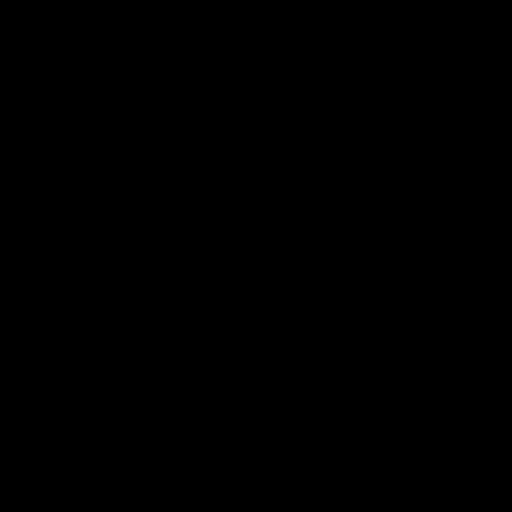

[16 of 30 positions shown; findings below may reference images not displayed]

FINDINGS: Again demonstrate remote infarction in the right
cerebellum.  Extensive atrophy and microvascular disease not
changed from MRI 3 days prior.  No acute intracranial hemorrhage.
No CT evidence of acute cortical infarction.  No midline shift or
mass effect.  No hydrocephalus.

There is again demonstrated opacification of the right mastoid air
cells.
IMPRESSION: 1..  No change in short interval follow-up MRI of 12/06/2012.
[DATE].  No acute intracranial findings.
3.  Remote infarction in the right cerebellum.
4.  Extensive microvascular disease and atrophy.

## 2018-04-22 DEATH — deceased

## 2018-07-12 ENCOUNTER — Encounter: Payer: Self-pay | Admitting: Gastroenterology
